# Patient Record
Sex: Female | Born: 1975 | Race: Black or African American | Hispanic: No | Marital: Single | Smoking: Never smoker
Health system: Southern US, Community
[De-identification: ages and names within clinical notes are randomized; demographics above are authoritative.]

## PROBLEM LIST (undated history)

## (undated) DIAGNOSIS — R519 Headache, unspecified: Secondary | ICD-10-CM

## (undated) DIAGNOSIS — G709 Myoneural disorder, unspecified: Secondary | ICD-10-CM

## (undated) DIAGNOSIS — K219 Gastro-esophageal reflux disease without esophagitis: Secondary | ICD-10-CM

## (undated) DIAGNOSIS — F329 Major depressive disorder, single episode, unspecified: Secondary | ICD-10-CM

## (undated) DIAGNOSIS — F32A Depression, unspecified: Secondary | ICD-10-CM

## (undated) DIAGNOSIS — E785 Hyperlipidemia, unspecified: Secondary | ICD-10-CM

## (undated) DIAGNOSIS — Z87442 Personal history of urinary calculi: Secondary | ICD-10-CM

## (undated) DIAGNOSIS — E119 Type 2 diabetes mellitus without complications: Secondary | ICD-10-CM

## (undated) DIAGNOSIS — R51 Headache: Secondary | ICD-10-CM

## (undated) DIAGNOSIS — I10 Essential (primary) hypertension: Secondary | ICD-10-CM

## (undated) HISTORY — DX: Headache: R51

## (undated) HISTORY — DX: Gastro-esophageal reflux disease without esophagitis: K21.9

## (undated) HISTORY — DX: Headache, unspecified: R51.9

## (undated) HISTORY — DX: Hyperlipidemia, unspecified: E78.5

## (undated) HISTORY — DX: Myoneural disorder, unspecified: G70.9

## (undated) HISTORY — DX: Essential (primary) hypertension: I10

---
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
## 1996-05-14 HISTORY — PX: CHOLECYSTECTOMY: SHX55

## 2004-08-19 ENCOUNTER — Emergency Department: Payer: Self-pay | Admitting: Emergency Medicine

## 2006-03-18 ENCOUNTER — Emergency Department: Payer: Self-pay | Admitting: Emergency Medicine

## 2007-03-26 ENCOUNTER — Ambulatory Visit: Payer: Self-pay | Admitting: Psychiatry

## 2007-03-26 ENCOUNTER — Inpatient Hospital Stay (HOSPITAL_COMMUNITY): Admission: RE | Admit: 2007-03-26 | Discharge: 2007-03-31 | Payer: Self-pay | Admitting: Psychiatry

## 2007-07-30 ENCOUNTER — Ambulatory Visit: Payer: Self-pay | Admitting: Family Medicine

## 2007-08-19 ENCOUNTER — Ambulatory Visit: Payer: Self-pay

## 2007-09-17 ENCOUNTER — Ambulatory Visit: Payer: Self-pay

## 2008-06-26 ENCOUNTER — Emergency Department: Payer: Self-pay | Admitting: Emergency Medicine

## 2008-09-27 ENCOUNTER — Inpatient Hospital Stay: Payer: Self-pay | Admitting: Psychiatry

## 2009-01-04 ENCOUNTER — Emergency Department: Payer: Self-pay | Admitting: Unknown Physician Specialty

## 2009-09-12 ENCOUNTER — Emergency Department: Payer: Self-pay | Admitting: Emergency Medicine

## 2010-01-25 ENCOUNTER — Emergency Department: Payer: Self-pay | Admitting: Emergency Medicine

## 2010-02-16 ENCOUNTER — Ambulatory Visit: Payer: Self-pay

## 2010-03-09 ENCOUNTER — Emergency Department: Payer: Self-pay | Admitting: Emergency Medicine

## 2010-06-08 ENCOUNTER — Emergency Department: Payer: Self-pay | Admitting: Internal Medicine

## 2010-09-29 NOTE — Discharge Summary (Signed)
NAMEJENEVIE, [name] NO.:  [id number]   MEDICAL RECORD NO.:  [id number]          PATIENT TYPE:  IPS   LOCATION:  0503                          FACILITY:  [facility]   PHYSICIAN:  Geoffery Lyons, M.D.      DATE OF BIRTH:  09/18/1975   DATE OF ADMISSION:  03/26/2007  DATE OF DISCHARGE:  03/31/2007                               DISCHARGE SUMMARY   CHIEF COMPLAINT/HISTORY OF PRESENT ILLNESS:  This is the first admission  to [facility] for this 35 year old female who was  thinking about killing herself.  Got hooked up on [place] movies for  the last 6 months.  Before that, there were other activities on the  Internet.  She would start at 8 or 9 in the morning and finish at 5 in  the morning of the next day, at least would be there 4-5 hours.  Before  the [place] movies, it was Interview with the Vampire.  Has worked  Designer, fashion/clothing, assembly type work.  Reported becoming more  depressed after the Internet service was called off, not being able to  see the movie Anime.  In the past 2-3 years, she has spent almost 24  hours per day watching science fiction, Dragon __________,  and recently  the [place] movies.  Does not trust people and prefers her animated  friends, lives  with the mother and the brother.  Currently, she was  staying with her sister due to the water and electricity not being on at  their home.  Reports being suicidal every day.  She reported she if she  cannot be in anime she would not want to be here.   PAST PSYCHIATRIC HISTORY:  She has been seen at [facility], has seen a  Veterinary surgeon.  Had been on Celexa.   MEDICAL HISTORY:  Migraines.   Physical exam performed failed to show any acute findings.   LABORATORY WORK UP:  White blood cells 14.6, repeated it was 7.0,  hemoglobin 11, mean corpuscular volume 70, glucose 164, BUN 7,  creatinine 0.54, SGOT 20, SGPT 14, hemoglobin A1c 7.2.  Drug screening  negative for substance abuse.   PHYSICAL EXAMINATION:  GENERAL:  An alert, cooperative female, casually  dressed.  Eye contact was fair, but her behavior was as expected.  She  did perform a lot of facial grimaces.  Speech was somewhat hesitant she  was alert.  Anxious mood.  Affect constricted.  Anxiety level was  moderate.  Thought process was coherent and relevant, mostly talking  about her relationship with the animated world.  Some ruminations,  obsessions.  Normal perception, although question some visual  hallucinations.  Suicidal thoughts using an exacto blade, wanting to go  to anime world, but would not act on the thoughts when in the hospital.  Cognition well-preserved.   ADMITTING DIAGNOSES:  AXIS I:  Rule out impulse control not otherwise  specified, rule out psychotic disorder not otherwise specified, rule out  social anxiety.  AXIS II:  Deferred.  AXIS III:  No diagnosis.  AXIS IV:  Moderate.  AXIS V:  Upon admission 35, highest global assessment of function in the  last year 70.   COURSE IN THE HOSPITAL:  She was admitted.  We were trying to get more  information.  Apparently, the mother shared that she has always had  problems.  Apparently, her senior year was very difficult, and she was  picked on, stay to herself, started relying more and more on the  Internet, movies, games.  On March 29, 2007, she endorsed a long  history of avoidance, stating that when in school she  was the odd  ball.  Very isolated, doing things by herself, avoiding other people.  It got to a point that she was more comfortable with being by herself,  very anxious when she thinks about getting out of the house and  interacting with people.  Endorsed that she has seen these characters  from the movies, has heard their voices.  We started Risperdal, as well  as Luvox.  She admitted that she had been depressed for a long time.  She felt that the family has not understood her depression.  She claimed  that her family,  especially her brother, has encouraged her to revoke  the devil, speaking of her depression.  Family was oriented as far as  mental illness.  We continued to work with her with the medication, work  with coping skills, finding alternate  ways of coping.  Family session  with mother and sister.  They talk about activities outside of the house  that will replace or reduce the time she spent on the Internet.  She was  receptive.  She felt that the session with the mother and the sister  went well, that she was able to share.  She felt that they understood  more so than before.  The family was supportive and understanding.  She  was going to work towards getting more involved, looking forward to  working with the counselor, and there were no suicidal or homicidal  ideations.  She was encouraged to follow up medically, given the fact  that she has had some lab findings that were abnormal.   DISCHARGE DIAGNOSES:  AXIS I:  Major depression with psychotic features,  anxiety disorder not otherwise specified.  Rule out impulse control not  otherwise specified.  AXIS II:  No diagnosis.  AXIS III:  No diagnosis.  AXIS IV: Moderate.  AXIS V:  Global assessment of function on discharge 50.   DISCHARGE MEDICATIONS:  1. Risperdal 0.25 twice a day and 0.5 at bedtime.  2. Luvox 50 mg two at bedtime.  3. Cogentin one in the morning and one at bedtime.   FOLLOWUP:  Frederich Chick and with [facility].  Follow up her low  iron and her blood sugar control.      Geoffery Lyons, M.D.  Electronically Signed     IL/MEDQ  D:  05/08/2007  T:  05/08/2007  Job:  [id number]

## 2010-12-09 ENCOUNTER — Emergency Department: Payer: Self-pay | Admitting: Unknown Physician Specialty

## 2011-02-20 LAB — IRON AND TIBC
Iron: 42
Saturation Ratios: 12 — ABNORMAL LOW
TIBC: 363
UIBC: 321

## 2011-02-20 LAB — COMPREHENSIVE METABOLIC PANEL
ALT: 14
AST: 20
Albumin: 3.9
Alkaline Phosphatase: 84
BUN: 7
CO2: 27
Calcium: 9.4
Chloride: 103
Creatinine, Ser: 0.54
GFR calc Af Amer: >60
GFR calc non Af Amer: >60
Glucose, Bld: 164 — ABNORMAL HIGH
Potassium: 4.3
Sodium: 136
Total Bilirubin: 0.8
Total Protein: 7.6

## 2011-02-20 LAB — CBC
HCT: 32.3 — ABNORMAL LOW
HCT: 34.2 — ABNORMAL LOW
Hemoglobin: 10.6 — ABNORMAL LOW
Hemoglobin: 11 — ABNORMAL LOW
MCHC: 32.6
MCHC: 32.2
MCV: 69.9 — ABNORMAL LOW
MCV: 70 — ABNORMAL LOW
Platelets: 468 — ABNORMAL HIGH
Platelets: 371
RBC: 4.63
RBC: 4.89
RDW: 17 — ABNORMAL HIGH
RDW: 17.9 — ABNORMAL HIGH
WBC: 7
WBC: 14.6 — ABNORMAL HIGH

## 2011-02-20 LAB — DRUGS OF ABUSE SCREEN W/O ALC, ROUTINE URINE
Amphetamine Screen, Ur: NEGATIVE
Barbiturate Quant, Ur: NEGATIVE
Benzodiazepines.: NEGATIVE
Cocaine Metabolites: NEGATIVE
Creatinine,U: 112
Marijuana Metabolite: NEGATIVE
Methadone: NEGATIVE
Opiate Screen, Urine: NEGATIVE
Phencyclidine (PCP): NEGATIVE
Propoxyphene: NEGATIVE

## 2011-02-20 LAB — LIPID PANEL
Cholesterol: 147
HDL: 34 — ABNORMAL LOW
LDL Cholesterol: 96
Total CHOL/HDL Ratio: 4.3
Triglycerides: 87
VLDL: 17

## 2011-02-20 LAB — URINALYSIS, ROUTINE W REFLEX MICROSCOPIC
Bilirubin Urine: NEGATIVE
Glucose, UA: 250 — AB
Hgb urine dipstick: NEGATIVE
Ketones, ur: NEGATIVE
Nitrite: NEGATIVE
Protein, ur: NEGATIVE
Specific Gravity, Urine: 1.023
Urobilinogen, UA: 0.2
pH: 5.5

## 2011-02-20 LAB — FERRITIN: Ferritin: 46 (ref 10–291)

## 2011-02-20 LAB — DIFFERENTIAL
Basophils Absolute: 0
Basophils Relative: 0
Eosinophils Absolute: 0.2
Eosinophils Relative: 3
Lymphocytes Relative: 34
Lymphs Abs: 2.4
Monocytes Absolute: 0.6
Monocytes Relative: 8
Neutro Abs: 3.8
Neutrophils Relative %: 55

## 2011-02-20 LAB — PREGNANCY, URINE: Preg Test, Ur: NEGATIVE

## 2011-02-20 LAB — TSH: TSH: 2.517

## 2011-02-20 LAB — HEMOGLOBIN A1C
Hgb A1c MFr Bld: 7.2 — ABNORMAL HIGH
Mean Plasma Glucose: 179

## 2011-02-20 LAB — RPR: RPR Ser Ql: NONREACTIVE

## 2011-02-20 LAB — VITAMIN B12: Vitamin B-12: 315 (ref 211–911)

## 2011-09-08 ENCOUNTER — Emergency Department: Payer: Self-pay | Admitting: *Deleted

## 2011-09-08 LAB — URINALYSIS, COMPLETE
Bilirubin,UR: NEGATIVE
Blood: NEGATIVE
Glucose,UR: NEGATIVE mg/dL (ref 0–75)
Ketone: NEGATIVE
Leukocyte Esterase: NEGATIVE
Nitrite: NEGATIVE
Ph: 5 (ref 4.5–8.0)
Protein: NEGATIVE
RBC,UR: <1 /HPF (ref 0–5)
Specific Gravity: 1.015 (ref 1.003–1.030)
Squamous Epithelial: 6
WBC UR: 6 /HPF (ref 0–5)

## 2012-08-04 ENCOUNTER — Ambulatory Visit: Payer: Self-pay | Admitting: Ophthalmology

## 2013-04-01 ENCOUNTER — Other Ambulatory Visit: Payer: Self-pay | Admitting: Diagnostic Radiology

## 2013-04-01 LAB — HCG, QUANTITATIVE, PREGNANCY: Beta Hcg, Quant.: <1 m[IU]/mL — ABNORMAL LOW

## 2013-04-02 ENCOUNTER — Ambulatory Visit: Payer: Self-pay | Admitting: Internal Medicine

## 2013-05-14 DIAGNOSIS — Z87442 Personal history of urinary calculi: Secondary | ICD-10-CM

## 2013-05-14 HISTORY — DX: Personal history of urinary calculi: Z87.442

## 2013-06-17 ENCOUNTER — Emergency Department: Payer: Self-pay | Admitting: Emergency Medicine

## 2013-09-03 ENCOUNTER — Ambulatory Visit: Payer: Self-pay

## 2013-09-08 ENCOUNTER — Ambulatory Visit: Payer: Self-pay

## 2013-09-08 DIAGNOSIS — I4892 Unspecified atrial flutter: Secondary | ICD-10-CM

## 2013-12-16 ENCOUNTER — Ambulatory Visit: Payer: Self-pay

## 2014-01-18 ENCOUNTER — Emergency Department: Payer: Self-pay | Admitting: Emergency Medicine

## 2014-01-18 LAB — URINALYSIS, COMPLETE
Bacteria: NONE SEEN
Bilirubin,UR: NEGATIVE
Glucose,UR: NEGATIVE mg/dL (ref 0–75)
Ketone: NEGATIVE
Nitrite: NEGATIVE
Ph: 5 (ref 4.5–8.0)
Protein: NEGATIVE
RBC,UR: 756 /HPF (ref 0–5)
Specific Gravity: 1.015 (ref 1.003–1.030)
Squamous Epithelial: 1
WBC UR: 3 /HPF (ref 0–5)

## 2014-01-18 LAB — PREGNANCY, URINE: Pregnancy Test, Urine: NEGATIVE m[IU]/mL

## 2014-01-23 ENCOUNTER — Encounter (HOSPITAL_COMMUNITY): Payer: Self-pay | Admitting: Emergency Medicine

## 2014-01-23 ENCOUNTER — Emergency Department (HOSPITAL_COMMUNITY)
Admission: EM | Admit: 2014-01-23 | Discharge: 2014-01-24 | Disposition: A | Discharge status: Home / Self Care | Payer: Self-pay | Attending: Emergency Medicine | Admitting: Emergency Medicine

## 2014-01-23 ENCOUNTER — Emergency Department (HOSPITAL_COMMUNITY): Payer: Self-pay

## 2014-01-23 DIAGNOSIS — Z880 Allergy status to penicillin: Secondary | ICD-10-CM | POA: Insufficient documentation

## 2014-01-23 DIAGNOSIS — Z79899 Other long term (current) drug therapy: Secondary | ICD-10-CM | POA: Insufficient documentation

## 2014-01-23 DIAGNOSIS — E119 Type 2 diabetes mellitus without complications: Secondary | ICD-10-CM | POA: Insufficient documentation

## 2014-01-23 DIAGNOSIS — Z9089 Acquired absence of other organs: Secondary | ICD-10-CM | POA: Insufficient documentation

## 2014-01-23 DIAGNOSIS — R109 Unspecified abdominal pain: Secondary | ICD-10-CM | POA: Insufficient documentation

## 2014-01-23 DIAGNOSIS — Z3202 Encounter for pregnancy test, result negative: Secondary | ICD-10-CM | POA: Insufficient documentation

## 2014-01-23 DIAGNOSIS — M5442 Lumbago with sciatica, left side: Secondary | ICD-10-CM

## 2014-01-23 DIAGNOSIS — M5430 Sciatica, unspecified side: Secondary | ICD-10-CM | POA: Insufficient documentation

## 2014-01-23 DIAGNOSIS — M5441 Lumbago with sciatica, right side: Secondary | ICD-10-CM

## 2014-01-23 HISTORY — DX: Type 2 diabetes mellitus without complications: E11.9

## 2014-01-23 LAB — CBC WITH DIFFERENTIAL/PLATELET
Basophils Absolute: 0 10*3/uL (ref 0.0–0.1)
Basophils Relative: 0 % (ref 0–1)
Eosinophils Absolute: 0.2 10*3/uL (ref 0.0–0.7)
Eosinophils Relative: 3 % (ref 0–5)
HCT: 32.8 % — ABNORMAL LOW (ref 36.0–46.0)
Hemoglobin: 10.7 g/dL — ABNORMAL LOW (ref 12.0–15.0)
Lymphocytes Relative: 30 % (ref 12–46)
Lymphs Abs: 2.5 10*3/uL (ref 0.7–4.0)
MCH: 23.5 pg — ABNORMAL LOW (ref 26.0–34.0)
MCHC: 32.6 g/dL (ref 30.0–36.0)
MCV: 72.1 fL — ABNORMAL LOW (ref 78.0–100.0)
Monocytes Absolute: 0.5 10*3/uL (ref 0.1–1.0)
Monocytes Relative: 6 % (ref 3–12)
Neutro Abs: 5 10*3/uL (ref 1.7–7.7)
Neutrophils Relative %: 61 % (ref 43–77)
Platelets: 426 10*3/uL — ABNORMAL HIGH (ref 150–400)
RBC: 4.55 MIL/uL (ref 3.87–5.11)
RDW: 14.8 % (ref 11.5–15.5)
WBC: 8.2 10*3/uL (ref 4.0–10.5)

## 2014-01-23 LAB — COMPREHENSIVE METABOLIC PANEL
ALT: 15 U/L (ref 0–35)
AST: 16 U/L (ref 0–37)
Albumin: 4 g/dL (ref 3.5–5.2)
Alkaline Phosphatase: 87 U/L (ref 39–117)
Anion gap: 12 (ref 5–15)
BUN: 9 mg/dL (ref 6–23)
CO2: 27 mEq/L (ref 19–32)
Calcium: 9.6 mg/dL (ref 8.4–10.5)
Chloride: 99 mEq/L (ref 96–112)
Creatinine, Ser: 0.72 mg/dL (ref 0.50–1.10)
GFR calc Af Amer: >90 mL/min (ref >90)
GFR calc non Af Amer: >90 mL/min (ref >90)
Glucose, Bld: 86 mg/dL (ref 70–99)
Potassium: 4.1 mEq/L (ref 3.7–5.3)
Sodium: 138 mEq/L (ref 137–147)
Total Bilirubin: 0.3 mg/dL (ref 0.3–1.2)
Total Protein: 7.9 g/dL (ref 6.0–8.3)

## 2014-01-23 LAB — URINALYSIS, ROUTINE W REFLEX MICROSCOPIC
Bilirubin Urine: NEGATIVE
Glucose, UA: NEGATIVE mg/dL
Hgb urine dipstick: NEGATIVE
Ketones, ur: NEGATIVE mg/dL
Leukocytes, UA: NEGATIVE
Nitrite: NEGATIVE
Protein, ur: NEGATIVE mg/dL
Specific Gravity, Urine: 1.016 (ref 1.005–1.030)
Urobilinogen, UA: 0.2 mg/dL (ref 0.0–1.0)
pH: 7 (ref 5.0–8.0)

## 2014-01-23 LAB — POC URINE PREG, ED: Preg Test, Ur: NEGATIVE

## 2014-01-23 MED ORDER — HYDROCODONE-ACETAMINOPHEN 5-325 MG PO TABS
2.0000 | ORAL_TABLET | Freq: Once | ORAL | Status: AC
  Administered 2014-01-23: 2 via ORAL
  Filled 2014-01-23: qty 2

## 2014-01-23 NOTE — ED Notes (Signed)
The patient said she went to [facility] last week for the same thing and they scanned her.  The patient said they told her she had kidney stones but that was not why she was haivng back pain.  She said her pain has gotten worse even with taking tramadol for pain and her urine has gotten darker.  She says she hurts when she gets up and when she is laying down and tries to sit up.  She also says the pain radiates down both legs but mainly down her right leg.

## 2014-01-23 NOTE — ED Provider Notes (Signed)
CSN: [id number]     Arrival date & time 01/23/14  2151 History   First MD Initiated Contact with Patient 01/23/14 2231     Chief Complaint  Patient presents with  . Flank Pain    The patient said she went to [facility] last week for the same thing and they scanned her.  The patient said they told her she had kidney stones but that was not why she was haivng back pain.     (Consider location/radiation/quality/duration/timing/severity/associated sxs/prior Treatment) HPI [name] is a 38 year old female with past medical history of diabetes who presents to the ER tonight with back pain. Patient states last week around Thursday or Friday she was walking her dog, and the dog "pulled her the wrong way" which caused her to fall. Patient states since then she has been having a worsening lumbar back pain. Patient states her pain as a constant pain, a 10 out of 10 in severity, radiates to her lateral legs bilaterally down to her knees. Patient states any movement, range of motion of her back, or walking worsens her pain. Patient states lying flat alleviates her pain slightly. Patient denies any associated numbness, weakness, fever, abdominal pain, nausea, saddle anesthesia, urinary/bowel incontinence/retention. Patient also denies history of cancer, IV drug use.  Past Medical History  Diagnosis Date  . Diabetes mellitus without complication    Past Surgical History  Procedure Laterality Date  . Cholecystectomy     History reviewed. No pertinent family history. History  Substance Use Topics  . Smoking status: Never Smoker   . Smokeless tobacco: Never Used  . Alcohol Use: No   OB History   Grav Para Term Preterm Abortions TAB SAB Ect Mult Living                 Review of Systems  Constitutional: Negative for fever.  HENT: Negative for trouble swallowing.   Eyes: Negative for visual disturbance.  Respiratory: Negative for shortness of breath.   Cardiovascular: Negative for chest  pain.  Gastrointestinal: Negative for nausea, vomiting and abdominal pain.  Genitourinary: Negative for dysuria.  Musculoskeletal: Positive for back pain. Negative for neck pain.  Skin: Negative for rash.  Neurological: Negative for dizziness, weakness and numbness.  Psychiatric/Behavioral: Negative.       Allergies  Penicillins  Home Medications   Prior to Admission medications   Medication Sig Start Date End Date Taking? Authorizing Provider  glipiZIDE (GLUCOTROL) 10 MG tablet Take 10 mg by mouth 2 (two) times daily before a meal.   Yes Historical Provider, MD  Iron TABS Take 1 tablet by mouth daily.   Yes Historical Provider, MD  metFORMIN (GLUCOPHAGE) 1000 MG tablet Take 1,000 mg by mouth 2 (two) times daily with a meal.   Yes Historical Provider, MD  ranitidine (ZANTAC) 150 MG tablet Take 150 mg by mouth 2 (two) times daily.   Yes Historical Provider, MD  traMADol (ULTRAM) 50 MG tablet Take 50 mg by mouth every 6 (six) hours as needed for moderate pain.   Yes Historical Provider, MD  VERAPAMIL HCL PO Take 1 tablet by mouth daily.   Yes Historical Provider, MD  cyclobenzaprine (FLEXERIL) 10 MG tablet Take 1 tablet (10 mg total) by mouth 2 (two) times daily as needed for muscle spasms. 01/24/14   Monte Fantasia, PA-C  ibuprofen (ADVIL,MOTRIN) 800 MG tablet Take 1 tablet (800 mg total) by mouth 3 (three) times daily. 01/24/14   Monte Fantasia, PA-C   BP  109/53  Pulse 68  Temp(Src) 98.7 F (37.1 C) (Oral)  Resp 21  SpO2 100%  LMP 01/14/2014 Physical Exam  Constitutional: She appears well-developed and well-nourished. No distress.  HENT:  Head: Normocephalic and atraumatic.  Mouth/Throat: Oropharynx is clear and moist. No oropharyngeal exudate.  Eyes: Right eye exhibits no discharge. Left eye exhibits no discharge. No scleral icterus.  Neck: Normal range of motion.  Cardiovascular: Normal rate, regular rhythm and normal heart sounds.   No murmur heard. Pulmonary/Chest: Effort  normal and breath sounds normal. No respiratory distress.  Abdominal: Soft. There is no tenderness.  Musculoskeletal: She exhibits no edema.       Lumbar back: She exhibits tenderness, bony tenderness and pain. She exhibits normal range of motion, no swelling, no edema and no deformity.       Back:  Neurological: She has normal strength. No cranial nerve deficit or sensory deficit. She displays a negative Romberg sign. Coordination and gait normal. GCS eye subscore is 4. GCS verbal subscore is 5. GCS motor subscore is 6.  Reflex Scores:      Patellar reflexes are 2+ on the right side and 2+ on the left side.      Achilles reflexes are 2+ on the right side and 2+ on the left side. Fully alert, answering questions appropriately in full, clear sentences. Motor strength 5 out of 5 in all major muscle groups of upper and lower extremities. Positive straight leg raise bilaterally. Distal sensation intact the  Skin: Skin is warm and dry. No rash noted. She is not diaphoretic.  Psychiatric: She has a normal mood and affect.    ED Course  Procedures (including critical care time) Labs Review Labs Reviewed  URINALYSIS, ROUTINE W REFLEX MICROSCOPIC - Abnormal; Notable for the following:    APPearance CLOUDY (*)    All other components within normal limits  CBC WITH DIFFERENTIAL - Abnormal; Notable for the following:    Hemoglobin 10.7 (*)    HCT 32.8 (*)    MCV 72.1 (*)    MCH 23.5 (*)    Platelets 426 (*)    All other components within normal limits  COMPREHENSIVE METABOLIC PANEL  POC URINE PREG, ED    Imaging Review Dg Lumbar Spine Complete  01/23/2014   CLINICAL DATA:  Larey Seat.  Back pain.  EXAM: LUMBAR SPINE - COMPLETE 4+ VIEW  COMPARISON:  None.  FINDINGS: Normal alignment of the lumbar vertebral bodies. Disc spaces and vertebral bodies are maintained. The facets are normally aligned. No pars defects. The visualized bony pelvis is intact.  IMPRESSION: Normal alignment and no acute bony  findings.   Electronically Signed   By: Loralie Champagne M.D.   On: 01/23/2014 23:39     EKG Interpretation None      MDM   Final diagnoses:  Midline low back pain with left-sided sciatica  Midline low back pain with right-sided sciatica    Patient with back pain.  No neurological deficits and normal neuro exam.  Patient can walk but states is painful.  No loss of bowel or bladder control.  No concern for cauda equina.  No fever, night sweats, weight loss, h/o cancer, IVDU.  RICE protocol and pain medicine indicated and discussed with patient. Do to history of trauma with the fall, we will obtain radiographs of lumbar spine.  12:30 AM: Patient asleep on the bed when I talked to her. Advised patient her x-rays are unremarkable, and we'll discharge her at this time. We  encouraged patient to follow up with primary care physician, and provided her with a resource guide to help her find one. I also encouraged patient to call or return to the ER should she have any questions or concerns, or should her symptoms change, worsen.  BP 109/53  Pulse 68  Temp(Src) 98.7 F (37.1 C) (Oral)  Resp 21  SpO2 100%  LMP 01/14/2014   Signed,  Ladona Mow, PA-C 1:30 AM  This patient discussed with Dr. Benjiman Core, M.D.   Monte Fantasia, PA-C 01/24/14 0127  Monte Fantasia, PA-C 01/24/14 0130

## 2014-01-24 MED ORDER — CYCLOBENZAPRINE HCL 10 MG PO TABS: 10.0000 mg | ORAL_TABLET | Freq: Two times a day (BID) | ORAL | Status: DC | PRN

## 2014-01-24 MED ORDER — IBUPROFEN 800 MG PO TABS: 800.0000 mg | ORAL_TABLET | Freq: Three times a day (TID) | ORAL | Status: DC

## 2014-01-24 NOTE — ED Notes (Signed)
Patient is alert and orientedx4.  Patient was explained discharge instructions and they understood them with no questions.  The patient's mother, Ernestene Mention is taking the patient home.

## 2014-01-24 NOTE — Discharge Instructions (Signed)
Use Motrin 800mg  3 times a day as needed for pain. Use Flexeril twice a day as needed as a muscle relaxer. Refer to resource guide to help find a primary care physician to followup with for your lumbar back pain. Return to the ER if your symptoms change, worsen, if you develop any weakness, fever, nausea, vomiting, urinary or bowel trouble or should you have any questions or concerns.   Back Pain, Adult Low back pain is very common. About 1 in 5 people have back pain.The cause of low back pain is rarely dangerous. The pain often gets better over time.About half of people with a sudden onset of back pain feel better in just 2 weeks. About 8 in 10 people feel better by 6 weeks.  CAUSES Some common causes of back pain include:  Strain of the muscles or ligaments supporting the spine.  Wear and tear (degeneration) of the spinal discs.  Arthritis.  Direct injury to the back. DIAGNOSIS Most of the time, the direct cause of low back pain is not known.However, back pain can be treated effectively even when the exact cause of the pain is unknown.Answering your caregiver's questions about your overall health and symptoms is one of the most accurate ways to make sure the cause of your pain is not dangerous. If your caregiver needs more information, he or she may order lab work or imaging tests (X-rays or MRIs).However, even if imaging tests show changes in your back, this usually does not require surgery. HOME CARE INSTRUCTIONS For many people, back pain returns.Since low back pain is rarely dangerous, it is often a condition that people can learn to [employer] their own.   Remain active. It is stressful on the back to sit or stand in one place. Do not sit, drive, or stand in one place for more than 30 minutes at a time. Take short walks on level surfaces as soon as pain allows.Try to increase the length of time you walk each day.  Do not stay in bed.Resting more than 1 or 2 days can delay your  recovery.  Do not avoid exercise or work.Your body is made to move.It is not dangerous to be active, even though your back may hurt.Your back will likely heal faster if you return to being active before your pain is gone.  Pay attention to your body when you bend and lift. Many people have less discomfortwhen lifting if they bend their knees, keep the load close to their bodies,and avoid twisting. Often, the most comfortable positions are those that put less stress on your recovering back.  Find a comfortable position to sleep. Use a firm mattress and lie on your side with your knees slightly bent. If you lie on your back, put a pillow under your knees.  Only take over-the-counter or prescription medicines as directed by your caregiver. Over-the-counter medicines to reduce pain and inflammation are often the most helpful.Your caregiver may prescribe muscle relaxant drugs.These medicines help dull your pain so you can more quickly return to your normal activities and healthy exercise.  Put ice on the injured area.  Put ice in a plastic bag.  Place a towel between your skin and the bag.  Leave the ice on for 15-20 minutes, 03-04 times a day for the first 2 to 3 days. After that, ice and heat may be alternated to reduce pain and spasms.  Ask your caregiver about trying back exercises and gentle massage. This may be of some benefit.  Avoid  feeling anxious or stressed.Stress increases muscle tension and can worsen back pain.It is important to recognize when you are anxious or stressed and learn ways to manage it.Exercise is a great option. SEEK MEDICAL CARE IF:  You have pain that is not relieved with rest or medicine.  You have pain that does not improve in 1 week.  You have new symptoms.  You are generally not feeling well. SEEK IMMEDIATE MEDICAL CARE IF:   You have pain that radiates from your back into your legs.  You develop new bowel or bladder control problems.  You  have unusual weakness or numbness in your arms or legs.  You develop nausea or vomiting.  You develop abdominal pain.  You feel faint. Document Released: 04/30/2005 Document Revised: 10/30/2011 Document Reviewed: 09/01/2013 [facility] Patient Information 2015 [place], [place]. This information is not intended to replace advice given to you by your health care provider. Make sure you discuss any questions you have with your health care provider.   Sciatica Sciatica is pain, weakness, numbness, or tingling along the path of the sciatic nerve. The nerve starts in the lower back and runs down the back of each leg. The nerve controls the muscles in the lower leg and in the back of the knee, while also providing sensation to the back of the thigh, lower leg, and the sole of your foot. Sciatica is a symptom of another medical condition. For instance, nerve damage or certain conditions, such as a herniated disk or bone spur on the spine, pinch or put pressure on the sciatic nerve. This causes the pain, weakness, or other sensations normally associated with sciatica. Generally, sciatica only affects one side of the body. CAUSES   Herniated or slipped disc.  Degenerative disk disease.  A pain disorder involving the narrow muscle in the buttocks (piriformis syndrome).  Pelvic injury or fracture.  Pregnancy.  Tumor (rare). SYMPTOMS  Symptoms can vary from mild to very severe. The symptoms usually travel from the low back to the buttocks and down the back of the leg. Symptoms can include:  Mild tingling or dull aches in the lower back, leg, or hip.  Numbness in the back of the calf or sole of the foot.  Burning sensations in the lower back, leg, or hip.  Sharp pains in the lower back, leg, or hip.  Leg weakness.  Severe back pain inhibiting movement. These symptoms may get worse with coughing, sneezing, laughing, or prolonged sitting or standing. Also, being overweight may worsen  symptoms. DIAGNOSIS  Your caregiver will perform a physical exam to look for common symptoms of sciatica. He or she may ask you to do certain movements or activities that would trigger sciatic nerve pain. Other tests may be performed to find the cause of the sciatica. These may include:  Blood tests.  X-rays.  Imaging tests, such as an MRI or CT scan. TREATMENT  Treatment is directed at the cause of the sciatic pain. Sometimes, treatment is not necessary and the pain and discomfort goes away on its own. If treatment is needed, your caregiver may suggest:  Over-the-counter medicines to relieve pain.  Prescription medicines, such as anti-inflammatory medicine, muscle relaxants, or narcotics.  Applying heat or ice to the painful area.  Steroid injections to lessen pain, irritation, and inflammation around the nerve.  Reducing activity during periods of pain.  Exercising and stretching to strengthen your abdomen and improve flexibility of your spine. Your caregiver may suggest losing weight if the extra weight  makes the back pain worse.  Physical therapy.  Surgery to eliminate what is pressing or pinching the nerve, such as a bone spur or part of a herniated disk. HOME CARE INSTRUCTIONS   Only take over-the-counter or prescription medicines for pain or discomfort as directed by your caregiver.  Apply ice to the affected area for 20 minutes, 3-4 times a day for the first 48-72 hours. Then try heat in the same way.  Exercise, stretch, or perform your usual activities if these do not aggravate your pain.  Attend physical therapy sessions as directed by your caregiver.  Keep all follow-up appointments as directed by your caregiver.  Do not wear high heels or shoes that do not provide proper support.  Check your mattress to see if it is too soft. A firm mattress may lessen your pain and discomfort. SEEK IMMEDIATE MEDICAL CARE IF:   You lose control of your bowel or bladder  (incontinence).  You have increasing weakness in the lower back, pelvis, buttocks, or legs.  You have redness or swelling of your back.  You have a burning sensation when you urinate.  You have pain that gets worse when you lie down or awakens you at night.  Your pain is worse than you have experienced in the past.  Your pain is lasting longer than 4 weeks.  You are suddenly losing weight without reason. MAKE SURE YOU:  Understand these instructions.  Will watch your condition.  Will get help right away if you are not doing well or get worse. Document Released: 04/24/2001 Document Revised: 10/30/2011 Document Reviewed: 09/09/2011 [facility] Patient Information 2015 [place], [place]. This information is not intended to replace advice given to you by your health care provider. Make sure you discuss any questions you have with your health care provider.   Emergency Department Resource Guide 1) Find a Doctor and Pay Out of Pocket Although you won't have to find out who is covered by your insurance plan, it is a good idea to ask around and get recommendations. You will then need to call the office and see if the doctor you have chosen will accept you as a new patient and what types of options they offer for patients who are self-pay. Some doctors offer discounts or will set up payment plans for their patients who do not have insurance, but you will need to ask so you aren't surprised when you get to your appointment.  2) Contact Your Local Health Department Not all health departments have doctors that can see patients for sick visits, but many do, so it is worth a call to see if yours does. If you don't know where your local health department is, you can check in your phone book. The CDC also has a tool to help you locate your state's health department, and many state websites also have listings of all of their local health departments.  3) Find a Walk-in Clinic If your illness is not  likely to be very severe or complicated, you may want to try a walk in clinic. These are popping up all over the country in pharmacies, drugstores, and shopping centers. They're usually staffed by nurse practitioners or physician assistants that have been trained to treat common illnesses and complaints. They're usually fairly quick and inexpensive. However, if you have serious medical issues or chronic medical problems, these are probably not your best option.  No Primary Care Doctor: - Call Health Connect at  [phone] - they can help you locate  a primary care doctor that  accepts your insurance, provides certain services, etc. - Physician Referral Service- [phone]  Chronic Pain Problems: Organization         Address  Phone   Notes  [facility]  [phone] Patients need to be referred by their primary care doctor.   Medication Assistance: Organization         Address  Phone   Notes  [facility] [address] [place] [place], [place] [address] [phone] --Must be a resident of [facility] -- Must have NO insurance coverage whatsoever (no Medicaid/ Medicare, etc.) -- The pt. MUST have a primary care doctor that directs their care regularly and follows them in the community   [facility]  [phone]   [employer]  [phone]    Agencies that provide inexpensive medical care: Organization         Address  Phone   Notes  [facility]  [phone]   [facility]    [phone]   [facility] [address] [place], [place] [address] [phone]   [facility] [address] [place], [place] [phone]   Planned Parenthood    [phone]   [facility]    [phone]   [facility] [place]  [address], [place] Phone:  [phone], Fax:  [phone] Hours of Operation:  9 am - 6 pm,  M-F.  Also accepts Medicaid/Medicare and self-pay.  [facility]  [address], [place] Phone: [phone], Fax: [phone]. Hours of Operation:  8:30 am - 5:30 pm, M-F.  Also accepts Medicaid and self-pay.  [facility] [place] [address], [place] Phone: [phone]   [facility] [address] Natasha Bence [place], [place] [phone], Ext. [phone] Mondays & Thursdays: 7-9 AM.  First 15 patients are seen on a first come, first serve basis.    Medicaid-accepting [facility] Providers:  Organization         Address  Phone   Notes  [facility] [address], [place] [phone] Also accepts self-pay patients.  [facility] [address] Laurell Josephs [place], [place]  [phone]   [facility] [address], [place] [phone]   [facility] [address], [place] [phone]   Renaye Rakers [address], [place]   [phone] Only accepts [place] Access [place] patients after they have their name applied to their card.   Self-Pay (no insurance) in [facility]:  Organization         Address  Phone   Notes  Sickle Cell Patients, [facility] Internal [facility] [address] [place], [place] [phone]   [facility] [address] [place], [place] [phone]   [facility] [place]  [address], [place] [phone]   [facility]/Dr. Osei-Bonsu  [address] [place] or [address], [place] [phone] Phone number for both [place] and [place] locations is the same.  [facility] [address], [place] [phone]   [facility] [address], [place] or [address] [phone]  [phone]   [facility] [address], [place] [phone], phone; [phone], fax Sees patients 1st and 3rd Saturday of every month.  Must not qualify for public or private insurance (i.e. Medicaid, Medicare, [place] Health Choice, Veterans' Benefits)  Household income should be no more than 200% of the poverty level The clinic cannot treat you if you are pregnant or think you are pregnant  Sexually transmitted diseases are not treated at the clinic.    Dental Care: Organization         Address  Phone  Notes  [facility] Department of [facility] [address] [place], [place] [phone] Accepts children up to age 1 who are enrolled in [place] or [place] Health Choice; pregnant women with a Medicaid card; and children who have applied for Medicaid or [place] Health Choice, but were declined, whose parents can pay a reduced fee at time of service.  [facility]  [address], [place] [phone] Accepts children up to age 2 who are enrolled in [place] or [place] Health Choice; pregnant women with a Medicaid card; and children who have applied for Medicaid or [place] Health Choice, but were declined, whose parents can pay a reduced fee at time of service.  [facility]  [address] [place], [place] [phone] Patients are seen by appointment only. Walk-ins are not accepted. [facility] will see patients 91 years of age and older. Monday - Tuesday (8am-5pm) Most Wednesdays (8:30-5pm) $30 per visit, cash only  [facility]  [address], [facility] [phone] Patients are seen by appointment only. Walk-ins are not accepted. [facility] will see patients 38 years of age and older. One Wednesday Evening (Monthly: Volunteer Based).  $30 per visit, cash only  [employer]  [phone] for adults; Children under age 19, call Graduate Pediatric Dentistry at [phone]. Children aged 42-14, please call [phone] to request a pediatric application.  Dental services are provided in all areas of dental care including fillings, crowns and bridges, complete and partial dentures, implants, gum treatment, root canals, and extractions. Preventive care is also provided. Treatment is provided to both adults and children. Patients are selected via a lottery and there is often a waiting list.   [facility] [address], [place]  [phone] [email or web address]   [facility] [address] [place], [place] [phone], Ext. [phone] Second and Fourth Thursday of each month, opens at 6:30 AM; Clinic ends at 9 AM.  Patients are seen on a first-come first-served basis, and a limited number are seen during each clinic.   [facility]  [address] Ether Griffins [place], [place] [phone]   Eligibility Requirements You must have lived in [place], [place], or [place] counties for at least the last three months.   You cannot be eligible for state or federal sponsored [place], including [employer], [place], or [employer].   You generally cannot be eligible for healthcare insurance through your employer.    How to apply: Eligibility screenings are held every Tuesday and Wednesday afternoon from 1:00 pm until 4:00 pm. You do not need an appointment for the interview!  [facility] [address] [place], [place] [phone]   [facility] Health Department  [phone]   [facility] Health Department  [phone]  [facility] Department  [phone]    Behavioral Health Resources in the Community: Intensive Outpatient Programs Organization         Address  Phone  Notes  [facility] [address] [place], [place] [phone]   [facility] Outpatient [address] [place], [place] [phone]   [facility]  [address] [place], [place]  [phone]   [facility] [address]  [place], [place] [phone] or [phone]   Substance Abuse Resources Organization         Address  Phone  Notes  [facility]  [phone]   [facility]  [phone]   [facility] [place]  ([phone]   Floydene Flock  [phone]   Residential & Outpatient Substance Abuse Program  ([phone]   Psychological Services Organization         Address  Phone  Notes  [facility] Behavioral Health  [phone]   [facility] Services  [phone]   [facility] [address] [place] [phone] or [phone]    Mobile Crisis Teams Organization         Address  Phone  Notes  [facility], Mobile Crisis Care Unit  [phone]   [facility]  [address] [place], [place] [phone]   Doristine Locks [address] [place] [phone]    Self-Help/Support Groups Organization         Address  Phone             Notes  [facility] [place] - variety of support groups  336- I7437963 Call for more information  Narcotics Anonymous (NA), [facility] [address], [employer] [place]  2 meetings at this location   Statistician         Address  Phone  Notes  [facility] [address],    [place]  [phone]   [facility]  [address], [place] [address], [place], [place] [phone]   [facility] [address] [place], [place] [phone] Admissions: 8am-3pm M-F  [facility] [address],    [place], [place] [phone]   [facility] [address] [place], [place], [place] [phone]   [facility] [address]  [place], [place] [phone]   [facility] - Intensive Outpatient [address] Laurell Josephs [address], [place], [place]  [phone]   [facility] ([facility] [address] [place]  [place], [place] [phone] or [phone]   [facility] ([facility]) [address], [place], [place] [phone] Accepts Medicaid  [facility] [address],  [place] [phone] Substance Abuse/Addiction Treatment   [facility] Organization         Address  Phone  Notes  [facility]  [phone]   Angie Fava, PhD [address] Ervin Knack [place], [place]   [phone] or [phone]   [facility]   [address] [place], [place] [phone]   [facility] [address], [employer], [place] [phone] Insurance/Medicaid/sponsorship through [employer] and Families [address]                                    [place], [place] [phone]  [place], [place] [phone]    Dr. Adele Schilder  [phone]   [facility] Dept. 1) [address], [place] 2) [place] 3)  [address], [place] [phone]  [phone]   [place] [phone] or [phone] (After Hours)

## 2014-01-24 NOTE — ED Provider Notes (Signed)
Medical screening examination/treatment/procedure(s) were performed by non-physician practitioner and as supervising physician I was immediately available for consultation/collaboration.   EKG Interpretation None       Juliet Rude. Rubin Payor, MD 01/24/14 1551

## 2014-01-24 NOTE — ED Notes (Signed)
Family at bedside. 

## 2014-02-09 ENCOUNTER — Ambulatory Visit: Payer: Self-pay

## 2014-02-09 LAB — HCG, QUANTITATIVE, PREGNANCY: Beta Hcg, Quant.: 1 m[IU]/mL

## 2014-04-02 ENCOUNTER — Emergency Department: Payer: Self-pay | Admitting: Emergency Medicine

## 2014-05-19 ENCOUNTER — Ambulatory Visit: Payer: Self-pay | Admitting: Nurse Practitioner

## 2014-05-27 LAB — HM DIABETES EYE EXAM

## 2014-06-06 ENCOUNTER — Emergency Department: Payer: Self-pay | Admitting: Emergency Medicine

## 2014-06-06 LAB — PREGNANCY, URINE: Pregnancy Test, Urine: NEGATIVE m[IU]/mL

## 2014-06-06 LAB — CBC WITH DIFFERENTIAL/PLATELET
Basophil #: 0 10*3/uL (ref 0.0–0.1)
Basophil %: 0.5 %
Eosinophil #: 0.2 10*3/uL (ref 0.0–0.7)
Eosinophil %: 2.1 %
HCT: 34.4 % — ABNORMAL LOW (ref 35.0–47.0)
HGB: 10.9 g/dL — ABNORMAL LOW (ref 12.0–16.0)
Lymphocyte #: 2.1 10*3/uL (ref 1.0–3.6)
Lymphocyte %: 25.8 %
MCH: 23.7 pg — ABNORMAL LOW (ref 26.0–34.0)
MCHC: 31.8 g/dL — ABNORMAL LOW (ref 32.0–36.0)
MCV: 74 fL — ABNORMAL LOW (ref 80–100)
Monocyte #: 0.6 x10 3/mm (ref 0.2–0.9)
Monocyte %: 6.8 %
Neutrophil #: 5.3 10*3/uL (ref 1.4–6.5)
Neutrophil %: 64.8 %
Platelet: 368 10*3/uL (ref 150–440)
RBC: 4.62 10*6/uL (ref 3.80–5.20)
RDW: 15.4 % — ABNORMAL HIGH (ref 11.5–14.5)
WBC: 8.2 10*3/uL (ref 3.6–11.0)

## 2014-06-06 LAB — URINALYSIS, COMPLETE
Bacteria: NONE SEEN
Bilirubin,UR: NEGATIVE
Glucose,UR: 150 mg/dL (ref 0–75)
Hyaline Cast: 2
Leukocyte Esterase: NEGATIVE
Nitrite: NEGATIVE
Ph: 5 (ref 4.5–8.0)
Protein: NEGATIVE
RBC,UR: 12 /HPF (ref 0–5)
Specific Gravity: 1.017 (ref 1.003–1.030)
Squamous Epithelial: 2
WBC UR: 1 /HPF (ref 0–5)

## 2014-06-06 LAB — COMPREHENSIVE METABOLIC PANEL
Albumin: 3.7 g/dL (ref 3.4–5.0)
Alkaline Phosphatase: 96 U/L
Anion Gap: 7 (ref 7–16)
BUN: 12 mg/dL (ref 7–18)
Bilirubin,Total: 0.5 mg/dL (ref 0.2–1.0)
Calcium, Total: 9.6 mg/dL (ref 8.5–10.1)
Chloride: 96 mmol/L — ABNORMAL LOW (ref 98–107)
Co2: 29 mmol/L (ref 21–32)
Creatinine: 1.32 mg/dL — ABNORMAL HIGH (ref 0.60–1.30)
EGFR (African American): 57 — ABNORMAL LOW
EGFR (Non-African Amer.): 47 — ABNORMAL LOW
Glucose: 307 mg/dL — ABNORMAL HIGH (ref 65–99)
Osmolality: 276 (ref 275–301)
Potassium: 3.7 mmol/L (ref 3.5–5.1)
SGOT(AST): 19 U/L (ref 15–37)
SGPT (ALT): 20 U/L
Sodium: 132 mmol/L — ABNORMAL LOW (ref 136–145)
Total Protein: 8.4 g/dL — ABNORMAL HIGH (ref 6.4–8.2)

## 2014-06-06 LAB — LIPASE, BLOOD: Lipase: 238 U/L (ref 73–393)

## 2014-06-14 ENCOUNTER — Ambulatory Visit: Payer: Self-pay | Admitting: Nurse Practitioner

## 2014-06-17 ENCOUNTER — Ambulatory Visit: Payer: Self-pay | Admitting: Internal Medicine

## 2014-07-13 ENCOUNTER — Ambulatory Visit
Admit: 2014-07-13 | Disposition: A | Discharge status: Home / Self Care | Payer: Self-pay | Attending: Nurse Practitioner | Admitting: Nurse Practitioner

## 2014-11-16 ENCOUNTER — Other Ambulatory Visit: Payer: Self-pay

## 2014-11-23 ENCOUNTER — Other Ambulatory Visit: Payer: Self-pay

## 2014-11-25 ENCOUNTER — Ambulatory Visit: Payer: Self-pay

## 2014-12-22 ENCOUNTER — Emergency Department
Admission: EM | Admit: 2014-12-22 | Discharge: 2014-12-22 | Disposition: A | Discharge status: Home / Self Care | Payer: Self-pay | Attending: Emergency Medicine | Admitting: Emergency Medicine

## 2014-12-22 DIAGNOSIS — J0100 Acute maxillary sinusitis, unspecified: Secondary | ICD-10-CM | POA: Insufficient documentation

## 2014-12-22 DIAGNOSIS — E119 Type 2 diabetes mellitus without complications: Secondary | ICD-10-CM | POA: Insufficient documentation

## 2014-12-22 DIAGNOSIS — Z79899 Other long term (current) drug therapy: Secondary | ICD-10-CM | POA: Insufficient documentation

## 2014-12-22 DIAGNOSIS — Z880 Allergy status to penicillin: Secondary | ICD-10-CM | POA: Insufficient documentation

## 2014-12-22 DIAGNOSIS — J302 Other seasonal allergic rhinitis: Secondary | ICD-10-CM | POA: Insufficient documentation

## 2014-12-22 DIAGNOSIS — J309 Allergic rhinitis, unspecified: Secondary | ICD-10-CM

## 2014-12-22 MED ORDER — IBUPROFEN 800 MG PO TABS
800.0000 mg | ORAL_TABLET | Freq: Once | ORAL | Status: AC
  Administered 2014-12-22: 800 mg via ORAL
  Filled 2014-12-22: qty 1

## 2014-12-22 MED ORDER — LORATADINE 10 MG PO TABS: 10.0000 mg | ORAL_TABLET | Freq: Once | ORAL | Status: DC

## 2014-12-22 MED ORDER — CETIRIZINE HCL 5 MG/5ML PO SYRP: 5.0000 mg | ORAL_SOLUTION | Freq: Once | ORAL | Status: DC

## 2014-12-22 MED ORDER — LORATADINE 10 MG PO TABS
10.0000 mg | ORAL_TABLET | Freq: Once | ORAL | Status: AC
  Administered 2014-12-22: 10 mg via ORAL
  Filled 2014-12-22: qty 1

## 2014-12-22 NOTE — ED Provider Notes (Signed)
[facility] Emergency Department Provider Note  ____________________________________________  Time seen: 4:00 AM  I have reviewed the triage vital signs and the nursing notes.   HISTORY  Chief Complaint Headache     HPI [name] is a 39 y.o. female presents with acute onset of clear rhinorrhea bilateral eye itching and congestion tonight patient states "felt like my allergy symptoms". Patient stated that she then take Benadryl (equate brand) patient states "couple minutes"after doing so she started experiencing facial discomfort predominantly on the right side.    Past Medical History  Diagnosis Date  . Diabetes mellitus without complication     There are no active problems to display for this patient.   Past Surgical History  Procedure Laterality Date  . Cholecystectomy      Current Outpatient Rx  Name  Route  Sig  Dispense  Refill  . glipiZIDE (GLUCOTROL) 10 MG tablet   Oral   Take 10 mg by mouth 2 (two) times daily before a meal.         . Iron TABS   Oral   Take 1 tablet by mouth daily.         . metFORMIN (GLUCOPHAGE) 1000 MG tablet   Oral   Take 1,000 mg by mouth 2 (two) times daily with a meal.         . ranitidine (ZANTAC) 150 MG tablet   Oral   Take 150 mg by mouth 2 (two) times daily.         Marland Kitchen SITagliptin Phosphate (JANUVIA PO)   Oral   Take 1 tablet by mouth daily.         Marland Kitchen VERAPAMIL HCL PO   Oral   Take 1 tablet by mouth daily.         . cyclobenzaprine (FLEXERIL) 10 MG tablet   Oral   Take 1 tablet (10 mg total) by mouth 2 (two) times daily as needed for muscle spasms.   20 tablet   0   . ibuprofen (ADVIL,MOTRIN) 800 MG tablet   Oral   Take 1 tablet (800 mg total) by mouth 3 (three) times daily.   21 tablet   0     Allergies Penicillins  No family history on file.  Social History Social History  Substance Use Topics  . Smoking status: Never Smoker   . Smokeless tobacco:  Never Used  . Alcohol Use: No    Review of Systems  Constitutional: Negative for fever. Eyes: Negative for visual changes. ENT: Negative for sore throat. Positive for facial pain Cardiovascular: Negative for chest pain. Respiratory: Negative for shortness of breath. Gastrointestinal: Negative for abdominal pain, vomiting and diarrhea. Genitourinary: Negative for dysuria. Musculoskeletal: Negative for back pain. Skin: Negative for rash. Neurological: Negative for headaches, focal weakness or numbness.   10-point ROS otherwise negative.  ____________________________________________   PHYSICAL EXAM:  VITAL SIGNS: ED Triage Vitals  Enc Vitals Group     BP --      Pulse --      Resp --      Temp --      Temp src --      SpO2 --      Weight --      Height --      Head Cir --      Peak Flow --      Pain Score --      Pain Loc --      Pain Edu? --  Excl. in GC? --     Constitutional: Alert and oriented. Well appearing and in no distress. Eyes: Conjunctivae are normal. PERRL. Normal extraocular movements. ENT   Head: Normocephalic and atraumatic. Pain with percussion of right maxillary sinus   Nose: No congestion/rhinnorhea.   Mouth/Throat: Mucous membranes are moist.   Neck: No stridor. Cardiovascular: Normal rate, regular rhythm. Normal and symmetric distal pulses are present in all extremities. No murmurs, rubs, or gallops. Respiratory: Normal respiratory effort without tachypnea nor retractions. Breath sounds are clear and equal bilaterally. No wheezes/rales/rhonchi. Gastrointestinal: Soft and nontender. No distention. There is no CVA tenderness. Genitourinary: deferred Musculoskeletal: Nontender with normal range of motion in all extremities. No joint effusions.  No lower extremity tenderness nor edema. Neurologic:  Normal speech and language. No gross focal neurologic deficits are appreciated. Speech is normal.  Skin:  Skin is warm, dry and  intact. No rash noted. Psychiatric: Mood and affect are normal. Speech and behavior are normal. Patient exhibits appropriate insight and judgment.    INITIAL IMPRESSION / ASSESSMENT AND PLAN / ED COURSE  Pertinent labs & imaging results that were available during my care of the patient were reviewed by me and considered in my medical decision making (see chart for details).  History of physical exam consistent with acute allergic sinusitis.   ____________________________________________   FINAL CLINICAL IMPRESSION(S) / ED DIAGNOSES  Final diagnoses:  Allergic sinusitis  Acute maxillary sinusitis, recurrence not specified      Darci Current, MD 12/22/14 0425

## 2014-12-22 NOTE — ED Notes (Signed)
Computer downtime--see paper chart 

## 2014-12-22 NOTE — Discharge Instructions (Signed)

## 2015-01-27 ENCOUNTER — Ambulatory Visit: Payer: Self-pay

## 2015-02-03 ENCOUNTER — Ambulatory Visit: Payer: Self-pay

## 2015-03-03 ENCOUNTER — Other Ambulatory Visit: Payer: Self-pay

## 2015-03-03 LAB — LIPID PANEL
Cholesterol: 168 mg/dL (ref 0–200)
HDL: 33 mg/dL — AB (ref 35–70)
LDL Cholesterol: 105 mg/dL
Triglycerides: 150 mg/dL (ref 40–160)

## 2015-03-10 ENCOUNTER — Ambulatory Visit: Payer: Self-pay

## 2015-03-10 DIAGNOSIS — E119 Type 2 diabetes mellitus without complications: Secondary | ICD-10-CM | POA: Insufficient documentation

## 2015-03-10 DIAGNOSIS — R609 Edema, unspecified: Secondary | ICD-10-CM | POA: Insufficient documentation

## 2015-03-10 DIAGNOSIS — D649 Anemia, unspecified: Secondary | ICD-10-CM | POA: Insufficient documentation

## 2015-03-10 DIAGNOSIS — E1165 Type 2 diabetes mellitus with hyperglycemia: Secondary | ICD-10-CM | POA: Insufficient documentation

## 2015-06-02 ENCOUNTER — Other Ambulatory Visit: Payer: Self-pay

## 2015-06-09 ENCOUNTER — Ambulatory Visit: Payer: Self-pay

## 2015-06-14 ENCOUNTER — Other Ambulatory Visit: Payer: Self-pay

## 2015-06-14 LAB — BASIC METABOLIC PANEL
BUN: 9 mg/dL (ref 4–21)
Creatinine: 0.8 mg/dL (ref 0.5–1.1)
Glucose: 306 mg/dL
Potassium: 4.2 mmol/L (ref 3.4–5.3)
Sodium: 134 mmol/L — AB (ref 137–147)

## 2015-06-14 LAB — CBC AND DIFFERENTIAL
HCT: 33 % — AB (ref 36–46)
Hemoglobin: 9.9 g/dL — AB (ref 12.0–16.0)
Neutrophils Absolute: 4 /uL
Platelets: 406 10*3/uL — AB (ref 150–399)
WBC: 6.8 10^3/mL

## 2015-06-14 LAB — HEPATIC FUNCTION PANEL
ALT: 6 U/L — AB (ref 7–35)
AST: 9 U/L — AB (ref 13–35)
Alkaline Phosphatase: 83 U/L (ref 25–125)
Bilirubin, Total: 0.3 mg/dL

## 2015-06-14 LAB — TSH: TSH: 6.22 u[IU]/mL — AB (ref 0.41–5.90)

## 2015-06-14 LAB — HEMOGLOBIN A1C: Hemoglobin A1C: 9.5

## 2015-06-21 ENCOUNTER — Ambulatory Visit: Payer: Self-pay

## 2015-06-30 ENCOUNTER — Ambulatory Visit: Payer: Self-pay

## 2015-07-01 DIAGNOSIS — E118 Type 2 diabetes mellitus with unspecified complications: Secondary | ICD-10-CM

## 2015-07-01 DIAGNOSIS — R609 Edema, unspecified: Secondary | ICD-10-CM

## 2015-07-01 DIAGNOSIS — D649 Anemia, unspecified: Secondary | ICD-10-CM

## 2015-07-14 ENCOUNTER — Ambulatory Visit: Payer: Self-pay | Admitting: Licensed Clinical Social Worker

## 2015-07-14 ENCOUNTER — Institutional Professional Consult (permissible substitution): Payer: Self-pay | Admitting: Licensed Clinical Social Worker

## 2015-07-21 ENCOUNTER — Ambulatory Visit: Payer: Self-pay | Admitting: Ophthalmology

## 2015-07-26 ENCOUNTER — Telehealth: Payer: Self-pay

## 2015-07-26 NOTE — Telephone Encounter (Signed)
Patient called wants to reschedule missed eye appointment. [name] has new phone number [phone]

## 2015-07-27 NOTE — Telephone Encounter (Signed)
Called patient 9:43 a.m. On 07/27/15. Patient answered, and appointment was rescheduled for 08/04/15 at 2:30 p.m. With Dr. Dorris CarnesShields.

## 2015-08-02 ENCOUNTER — Ambulatory Visit: Payer: Self-pay | Admitting: Endocrinology

## 2015-08-02 VITALS — BP 96/62 | HR 88 | Wt 249.0 lb

## 2015-08-02 DIAGNOSIS — E1165 Type 2 diabetes mellitus with hyperglycemia: Secondary | ICD-10-CM

## 2015-08-02 MED ORDER — METFORMIN HCL ER 500 MG PO TB24: 1000.0000 mg | ORAL_TABLET | Freq: Two times a day (BID) | ORAL | Status: DC

## 2015-08-02 NOTE — Progress Notes (Signed)
Subjective:     Patient ID: [name], female   DOB: 1975-08-01, 40 y.o.   MRN: [id number]  HPI Diagnosed with Type II in 2009. Checks blood sugar twice a day. Reported average of 250.   Reports hypoglyemia a few times a week. Feels "loopy" at high 80s. Doesn't report any pattern.  Skips breakfast. Drinks apple juice and orange juice. Couple of times a week feels nauseated in the morning. Started a few weeks ago. Notices that has spikes in blood sugar and then followed by lows.   Highest blood sugar reported 349. Before Venezuelajanuvia this was "normal". Started Venezuelajanuvia 6 months ago.   Reports running stools for approximately 6-7 months. Metformin was started a while before. Denies polyuria. States neuropathy for months . History of depression and did not want to take Daisy BlossomLereka because of this.        Review of Systems       Objective:   Physical Exam CV: normal s1/s2. RRR. No m/r/g Pulm: CTAB Neuro: Reduced sensation on monofilament test. 3/5    Assessment:          Plan:     Per Dr. Rosalin HawkingKirkman's Note     Hypoglycemia: Patient checks blood sugars twice a day at 7/7. In the morning, reports 250 for blood sugar. In the middle of the day 120-145. Will skip meals in the morning if feeling naseated. Had a bout of hypoglycemia in [place] and checked blood sugar, which was around 100. This was worrisome for the patient. Reduce glipizide to PO at night, one 10mg  at supper only. Takes glipizide after eating.   Diarrhea: Reports loose stools several times a week. Switch to metformin XR. Take 500 mg BID.

## 2015-08-02 NOTE — Progress Notes (Signed)
Patient's primary care provider: [facility]   [name] returns for follow-up of type 2 diabetes.    Other problems include:  Patient Active Problem List   Diagnosis Date Noted  . Diabetes (HCC) 03/10/2015  . Anemia 03/10/2015  . Edema 03/10/2015    Her current medications include: Current Outpatient Prescriptions  Medication Sig Dispense Refill  . glipiZIDE (GLUCOTROL) 10 MG tablet Take 10 mg by mouth 2 (two) times daily before a meal.    . ibuprofen (ADVIL,MOTRIN) 800 MG tablet Take 1 tablet (800 mg total) by mouth 3 (three) times daily. (Patient taking differently: Take 800 mg by mouth every 8 (eight) hours as needed. ) 21 tablet 0  . ranitidine (ZANTAC) 150 MG tablet Take 150 mg by mouth once.     Marland Kitchen. SITagliptin Phosphate (JANUVIA PO) Take 1 tablet by mouth daily.    Marland Kitchen. VERAPAMIL HCL PO Take 1 tablet by mouth daily.    . cyclobenzaprine (FLEXERIL) 10 MG tablet Take 1 tablet (10 mg total) by mouth 2 (two) times daily as needed for muscle spasms. (Patient not taking: Reported on 08/02/2015) 20 tablet 0  . Iron TABS Take 1 tablet by mouth daily. Reported on 08/02/2015    . metFORMIN (GLUCOPHAGE-XR) 500 MG 24 hr tablet Take 2 tablets (1,000 mg total) by mouth 2 (two) times daily before a meal. 360 tablet 3   No current facility-administered medications for this visit.    Since the last visit, she has been taking Januvia 100 mg daily, metformin 1000 mg twice daily, and glipizide 10 mg twice daily.  Complains of diarrhea and nausea with metformin.  Occasional symptoms of hypoglycemia with BG in 80's during day.   Exam:  BP 96/62 mmHg  Pulse 88  Wt 249 lb (112.946 kg) Constitutional: Obese, Alert, oriented, in NAD Remainder of PE per triage note   Recent labs:  Results for orders placed or performed in visit on 07/01/15  CBC and differential  Result Value Ref Range   Hemoglobin 9.9 (A) 12.0 - 16.0 g/dL   HCT 33 (A) 36 - 46 %   Neutrophils Absolute 4 /L    Platelets 406 (A) 150 - 399 K/L   WBC 6.8 10^3/mL  Basic metabolic panel  Result Value Ref Range   Glucose 306 mg/dL   BUN 9 4 - 21 mg/dL   Creatinine 0.8 0.5 - 1.1 mg/dL   Potassium 4.2 3.4 - 5.3 mmol/L   Sodium 134 (A) 137 - 147 mmol/L  Lipid panel  Result Value Ref Range   Triglycerides 150 40 - 160 mg/dL   Cholesterol 161168 0 - 096200 mg/dL   HDL 33 (A) 35 - 70 mg/dL   LDL Cholesterol 045105 mg/dL  Hepatic function panel  Result Value Ref Range   Alkaline Phosphatase 83 25 - 125 U/L   ALT 6 (A) 7 - 35 U/L   AST 9 (A) 13 - 35 U/L   Bilirubin, Total 0.3 mg/dL  Hemoglobin [id number]  Result Value Ref Range   Hemoglobin A1C 9.5   TSH  Result Value Ref Range   TSH 6.22 (A) 0.41 - 5.90 uIU/mL    Assessment and Plan:    1. Type 2 diabetes mellitus with hyperglycemia, without long-term current use of insulin (HCC)  She has what still sounds like inadequately controlled diabetes. However, also has some symptomatic low-ish blood sugars, probably from the glipizide.  Will try her on extended release metformin: - metFORMIN (GLUCOPHAGE-XR) 500 MG  24 hr tablet; Take 2 tablets (1,000 mg total) by mouth 2 (two) times daily before a meal.  Dispense: 360 tablet; Refill: 3  Reduce glipizide to 10 mg daily before dinner.   Return in about 3 months (around 11/02/2015).  In the interim, she was instructed to contact the clinic for any refill needs or any problems related to her diabetes.

## 2015-08-04 ENCOUNTER — Ambulatory Visit: Payer: Self-pay | Admitting: Ophthalmology

## 2015-08-25 ENCOUNTER — Ambulatory Visit: Payer: Self-pay | Admitting: Ophthalmology

## 2015-09-15 ENCOUNTER — Ambulatory Visit: Payer: Self-pay

## 2015-09-22 ENCOUNTER — Ambulatory Visit: Payer: Self-pay | Admitting: Urology

## 2015-09-22 VITALS — BP 119/56 | HR 105 | Temp 98.1°F | Resp 16 | Ht 68.0 in | Wt 233.0 lb

## 2015-09-22 DIAGNOSIS — N946 Dysmenorrhea, unspecified: Secondary | ICD-10-CM

## 2015-09-22 NOTE — Progress Notes (Signed)
  Patient: Hannah KirksRadonna Nicole Cajamarca Female    DOB: 10/19/1975   40 y.o.   MRN: [id number] Visit Date: 09/22/2015  Today's Provider: [facility]   Chief Complaint  Patient presents with  . Dysmenorrhea   Subjective:    HPI Patient is a 40 year old [place] female who presents with dysmenorrhea for the last 2 months.  She is now having irregular periods with passage of large blood clots.  She is has to constantly wear pads in case of her periods starts.  She is having an increase of fatigue.    She has now developed diarrhea with the Glucophage-XR.  She is seeing the capsules in her stool.    Allergies  Allergen Reactions  . Penicillins Hives   Previous Medications   CYCLOBENZAPRINE (FLEXERIL) 10 MG TABLET    Take 1 tablet (10 mg total) by mouth 2 (two) times daily as needed for muscle spasms.   GLIPIZIDE (GLUCOTROL) 10 MG TABLET    Take 10 mg by mouth 2 (two) times daily before a meal.   IBUPROFEN (ADVIL,MOTRIN) 800 MG TABLET    Take 1 tablet (800 mg total) by mouth 3 (three) times daily.   IRON TABS    Take 1 tablet by mouth daily. Reported on 08/02/2015   METFORMIN (GLUCOPHAGE-XR) 500 MG 24 HR TABLET    Take 2 tablets (1,000 mg total) by mouth 2 (two) times daily before a meal.   RANITIDINE (ZANTAC) 150 MG TABLET    Take 150 mg by mouth once.    SITAGLIPTIN PHOSPHATE (JANUVIA PO)    Take 1 tablet by mouth daily.   VERAPAMIL HCL PO    Take 1 tablet by mouth daily.    Review of Systems  Constitutional: Positive for fatigue.  Eyes: Negative for pain, discharge and itching.  Respiratory: Negative for cough and shortness of breath.   Cardiovascular: Negative for palpitations.  Gastrointestinal: Positive for diarrhea. Negative for abdominal pain, blood in stool and anal bleeding.  Endocrine: Positive for cold intolerance. Negative for heat intolerance.  Genitourinary: Negative for frequency, flank pain and enuresis.    Social History  Substance Use Topics  .  Smoking status: Never Smoker   . Smokeless tobacco: Never Used  . Alcohol Use: No   Objective:   BP 119/56 mmHg  Pulse 105  Temp(Src) 98.1 F (36.7 C)  Resp 16  Ht 5\' 8"  (1.727 m)  Wt 233 lb (105.688 kg)  BMI 35.44 kg/m2  SpO2 99%  Physical Exam Constitutional: Well nourished. Alert and oriented, No acute distress. HEENT: [place] AT, moist mucus membranes. Trachea midline, no masses. Cardiovascular: No clubbing, cyanosis, or edema. Respiratory: Normal respiratory effort, no increased work of breathing. GI: Abdomen is soft, non tender, non distended, no abdominal masses.  GU: No CVA tenderness.  No bladder fullness or masses.   Skin: No rashes, bruises or suspicious lesions. Lymph: No cervical or inguinal adenopathy. Neurologic: Grossly intact, no focal deficits, moving all 4 extremities. Psychiatric: Normal mood and affect.     Assessment & Plan:     1. Dysmenorrhea  Check CBC and pregnancy test  Contact pharmacy if pregnancy test is negative for estrogen dose with phenergan             [facility] and the high dose prescription of estrogen is cost prohibitive     [facility]   [facility] [place]

## 2015-09-23 LAB — CBC WITH DIFFERENTIAL/PLATELET
Basophils Absolute: 0 10*3/uL (ref 0.0–0.2)
Basos: 0 %
EOS (ABSOLUTE): 0.2 10*3/uL (ref 0.0–0.4)
Eos: 2 %
Hematocrit: 31 % — ABNORMAL LOW (ref 34.0–46.6)
Hemoglobin: 9.7 g/dL — ABNORMAL LOW (ref 11.1–15.9)
Immature Grans (Abs): 0 10*3/uL (ref 0.0–0.1)
Immature Granulocytes: 0 %
Lymphocytes Absolute: 2 10*3/uL (ref 0.7–3.1)
Lymphs: 21 %
MCH: 23.6 pg — ABNORMAL LOW (ref 26.6–33.0)
MCHC: 31.3 g/dL — ABNORMAL LOW (ref 31.5–35.7)
MCV: 75 fL — ABNORMAL LOW (ref 79–97)
Monocytes Absolute: 0.5 10*3/uL (ref 0.1–0.9)
Monocytes: 5 %
Neutrophils Absolute: 7.1 10*3/uL — ABNORMAL HIGH (ref 1.4–7.0)
Neutrophils: 72 %
Platelets: 408 10*3/uL — ABNORMAL HIGH (ref 150–379)
RBC: 4.11 x10E6/uL (ref 3.77–5.28)
RDW: 15 % (ref 12.3–15.4)
WBC: 9.9 10*3/uL (ref 3.4–10.8)

## 2015-09-23 LAB — HCG, SERUM, QUALITATIVE: hCG,Beta Subunit,Qual,Serum: NEGATIVE m[IU]/mL (ref <6)

## 2015-09-27 ENCOUNTER — Ambulatory Visit: Payer: Self-pay

## 2015-09-29 ENCOUNTER — Encounter: Payer: Self-pay | Admitting: Pharmacist

## 2015-09-30 ENCOUNTER — Telehealth: Payer: Self-pay | Admitting: Urology

## 2015-09-30 NOTE — Telephone Encounter (Signed)
I saw [name] last week and she was having heavy menstrual bleeding.  High doses of estrogen is recommended, but the medication will cost ~$400.00 for the prescription.  She has [facility]  Do we still utilize the health department for abnormal gynecological issues?

## 2015-10-06 ENCOUNTER — Encounter: Payer: Self-pay | Admitting: Pharmacist

## 2015-10-06 ENCOUNTER — Other Ambulatory Visit: Payer: Self-pay | Admitting: Urology

## 2015-10-13 ENCOUNTER — Other Ambulatory Visit: Payer: Self-pay

## 2015-10-13 DIAGNOSIS — D649 Anemia, unspecified: Secondary | ICD-10-CM

## 2015-10-13 DIAGNOSIS — E118 Type 2 diabetes mellitus with unspecified complications: Secondary | ICD-10-CM

## 2015-10-14 LAB — COMPREHENSIVE METABOLIC PANEL
ALT: 9 IU/L (ref 0–32)
AST: 7 IU/L (ref 0–40)
Albumin/Globulin Ratio: 1.4 (ref 1.2–2.2)
Albumin: 4 g/dL (ref 3.5–5.5)
Alkaline Phosphatase: 79 IU/L (ref 39–117)
BUN/Creatinine Ratio: 8 — ABNORMAL LOW (ref 9–23)
BUN: 6 mg/dL (ref 6–24)
Bilirubin Total: 0.3 mg/dL (ref 0.0–1.2)
CO2: 24 mmol/L (ref 18–29)
Calcium: 9.3 mg/dL (ref 8.7–10.2)
Chloride: 99 mmol/L (ref 96–106)
Creatinine, Ser: 0.72 mg/dL (ref 0.57–1.00)
GFR calc Af Amer: 119 mL/min/{1.73_m2} (ref >59)
GFR calc non Af Amer: 104 mL/min/{1.73_m2} (ref >59)
Globulin, Total: 2.8 g/dL (ref 1.5–4.5)
Glucose: 171 mg/dL — ABNORMAL HIGH (ref 65–99)
Potassium: 4 mmol/L (ref 3.5–5.2)
Sodium: 138 mmol/L (ref 134–144)
Total Protein: 6.8 g/dL (ref 6.0–8.5)

## 2015-10-14 LAB — CBC WITH DIFFERENTIAL/PLATELET
Basophils Absolute: 0 10*3/uL (ref 0.0–0.2)
Basos: 0 %
EOS (ABSOLUTE): 0.2 10*3/uL (ref 0.0–0.4)
Eos: 2 %
Hematocrit: 29.8 % — ABNORMAL LOW (ref 34.0–46.6)
Hemoglobin: 9.2 g/dL — ABNORMAL LOW (ref 11.1–15.9)
Immature Grans (Abs): 0 10*3/uL (ref 0.0–0.1)
Immature Granulocytes: 0 %
Lymphocytes Absolute: 2 10*3/uL (ref 0.7–3.1)
Lymphs: 22 %
MCH: 22.6 pg — ABNORMAL LOW (ref 26.6–33.0)
MCHC: 30.9 g/dL — ABNORMAL LOW (ref 31.5–35.7)
MCV: 73 fL — ABNORMAL LOW (ref 79–97)
Monocytes Absolute: 0.6 10*3/uL (ref 0.1–0.9)
Monocytes: 7 %
Neutrophils Absolute: 6 10*3/uL (ref 1.4–7.0)
Neutrophils: 69 %
Platelets: 424 10*3/uL — ABNORMAL HIGH (ref 150–379)
RBC: 4.07 x10E6/uL (ref 3.77–5.28)
RDW: 15.3 % (ref 12.3–15.4)
WBC: 8.8 10*3/uL (ref 3.4–10.8)

## 2015-10-14 LAB — LIPID PANEL
Chol/HDL Ratio: 4.3 ratio units (ref 0.0–4.4)
Cholesterol, Total: 150 mg/dL (ref 100–199)
HDL: 35 mg/dL — ABNORMAL LOW (ref >39)
LDL Calculated: 91 mg/dL (ref 0–99)
Triglycerides: 120 mg/dL (ref 0–149)
VLDL Cholesterol Cal: 24 mg/dL (ref 5–40)

## 2015-10-14 LAB — HEMOGLOBIN A1C
Est. average glucose Bld gHb Est-mCnc: 180 mg/dL
Hgb A1c MFr Bld: 7.9 % — ABNORMAL HIGH (ref 4.8–5.6)

## 2015-10-20 ENCOUNTER — Ambulatory Visit: Payer: Self-pay

## 2015-10-27 ENCOUNTER — Ambulatory Visit: Payer: Self-pay | Admitting: Nurse Practitioner

## 2015-10-27 VITALS — BP 100/65 | HR 81 | Temp 98.8°F | Resp 16 | Ht 68.0 in | Wt 252.0 lb

## 2015-10-27 DIAGNOSIS — N926 Irregular menstruation, unspecified: Secondary | ICD-10-CM

## 2015-10-27 DIAGNOSIS — E782 Mixed hyperlipidemia: Secondary | ICD-10-CM

## 2015-10-27 DIAGNOSIS — D500 Iron deficiency anemia secondary to blood loss (chronic): Secondary | ICD-10-CM

## 2015-10-27 DIAGNOSIS — N946 Dysmenorrhea, unspecified: Secondary | ICD-10-CM

## 2015-10-27 DIAGNOSIS — E118 Type 2 diabetes mellitus with unspecified complications: Secondary | ICD-10-CM

## 2015-10-27 LAB — GLUCOSE, POCT (MANUAL RESULT ENTRY): POC Glucose: 206 mg/dl — AB (ref 70–99)

## 2015-10-27 MED ORDER — NAPROXEN 500 MG PO TABS: 500.0000 mg | ORAL_TABLET | Freq: Two times a day (BID) | ORAL | Status: DC

## 2015-10-27 NOTE — Progress Notes (Signed)
   Subjective:    Patient ID: [name], female    DOB: Aug 31, 1975, 40 y.o.   MRN: [id number]  HPI  Here today in office for dm and lab follow up, dm     Review of Systems BLE edema.      Objective:   Physical Exam  Alert verbally appropriate, ? If hair thinning at hairline.  Heart rate in 60's and last tsh elevated.    Swelling of ankles, with trace pitting edema, ending well below mid shin.       Assessment & Plan:    Dm control is much improved, with a1c down from 9.5 to 7.9.  Likely good response to [place]ajanuvia  Will not change any meds at this time, will recheck a1c in 3months Will also check cmp Lipids, will continue to monitor  Anemia, pt to continue on her ferrous gluconate, and will reassess in 3 months  Dysmenorrhea, will implement naproxen at 500 mg bid as needed  Irregular menses, will check fsh tonight.    Thinning hair, will order thyroid profile and address as indicated  Will do general maintenance labs in 3 months.    Edema, pt is normotensive, so  No diuretic, and well assess thyroid values.

## 2015-10-28 LAB — THYROID PANEL WITH TSH
Free Thyroxine Index: 1.8 (ref 1.2–4.9)
T3 Uptake Ratio: 30 % (ref 24–39)
T4, Total: 5.9 ug/dL (ref 4.5–12.0)
TSH: 2.58 u[IU]/mL (ref 0.450–4.500)

## 2015-10-28 LAB — FOLLICLE STIMULATING HORMONE: FSH: 10.9 m[IU]/mL

## 2015-11-03 ENCOUNTER — Telehealth: Payer: Self-pay

## 2015-11-03 NOTE — Telephone Encounter (Signed)
Left message stating she needs refill on Januvia.  Was told by [facility] to call us to get it refilled.  Was unable to place reorder under the meds and orders screen due to it being entered under historical provider and was unable to switch it.

## 2015-11-10 ENCOUNTER — Ambulatory Visit: Payer: Self-pay | Admitting: Obstetrics & Gynecology

## 2015-11-10 DIAGNOSIS — N921 Excessive and frequent menstruation with irregular cycle: Secondary | ICD-10-CM | POA: Insufficient documentation

## 2015-11-10 DIAGNOSIS — Z8742 Personal history of other diseases of the female genital tract: Secondary | ICD-10-CM

## 2015-11-10 NOTE — Progress Notes (Signed)
Subjective:     Patient ID: [name], female   DOB: [birth year], 40 y.o.   MRN: [id number]  HPI Pt is a 40 yo G0 AA F with c/o irreg periods over the last year that have been progressively becoming longer in duration although daily amount of bleeding has lessened.  Pt desribes worse pain when cycling.  Also has change in BMs as loose and dark during cycles.  Pain is in lower abd and rad to legs.  Vag itching and hot flashes also have been recently noticed.  No prior surgeries in abdomen other than gall bladder.  No pregnancies.  Reports never been sexually active.  Review of Systems  Constitutional: Positive for fatigue.  HENT: Negative.   Respiratory: Negative.   Cardiovascular: Negative.   Gastrointestinal: Negative.   Genitourinary: Positive for menstrual problem and pelvic pain.  Musculoskeletal: Negative.   Skin: Negative.   Neurological: Negative.   Hematological: Negative.   Psychiatric/Behavioral: Negative.   I   Objective:   Physical Exam  Constitutional: She is oriented to person, place, and time. She appears well-developed and well-nourished.  Abdominal: Soft. She exhibits no distension and no mass. There is no tenderness.  Limited by obesity  Genitourinary: Vagina normal.  Narrow vagina, tender, unable to place speculum based on pt discomfort Cervix on BIMANUAL exam small, no mass Uterus difficult to assess due to obesity  Neurological: She is alert and oriented to person, place, and time.  Skin: Skin is intact.       Assessment:     Perimenopausal bleeding vs fibroids or hormonal imbalance Pain on exam from vaginismus    Plan:     Rec GYN Ultrasound for anatomical assessment (fibroid, polyp, adenomyosis) If neg, then can consider hormonal treatment If pos, then may need surgical referral No other causes (blood thinner therapy, blood d/o, etc) that would be high on list. PAP unable to be done today due to viginal exam and pain/inability to place  speculum.

## 2015-11-13 ENCOUNTER — Other Ambulatory Visit: Payer: Self-pay | Admitting: Urology

## 2015-11-24 ENCOUNTER — Ambulatory Visit: Payer: Self-pay

## 2015-11-25 ENCOUNTER — Ambulatory Visit
Admission: RE | Admit: 2015-11-25 | Discharge: 2015-11-25 | Disposition: A | Discharge status: Home / Self Care | Payer: Self-pay | Source: Ambulatory Visit | Attending: Obstetrics & Gynecology | Admitting: Obstetrics & Gynecology

## 2015-11-25 DIAGNOSIS — Z8742 Personal history of other diseases of the female genital tract: Secondary | ICD-10-CM | POA: Insufficient documentation

## 2015-12-06 ENCOUNTER — Other Ambulatory Visit: Payer: Self-pay | Admitting: Urology

## 2015-12-06 MED ORDER — SITAGLIPTIN PHOSPHATE 50 MG PO TABS: 50.0000 mg | ORAL_TABLET | Freq: Every day | ORAL | 0 refills | Status: DC

## 2015-12-06 NOTE — Telephone Encounter (Signed)
Done

## 2016-02-01 ENCOUNTER — Other Ambulatory Visit: Payer: Self-pay | Admitting: Internal Medicine

## 2016-02-09 ENCOUNTER — Other Ambulatory Visit: Payer: Self-pay

## 2016-02-09 DIAGNOSIS — D500 Iron deficiency anemia secondary to blood loss (chronic): Secondary | ICD-10-CM

## 2016-02-09 DIAGNOSIS — E782 Mixed hyperlipidemia: Secondary | ICD-10-CM

## 2016-02-09 DIAGNOSIS — E118 Type 2 diabetes mellitus with unspecified complications: Secondary | ICD-10-CM

## 2016-02-10 LAB — COMPREHENSIVE METABOLIC PANEL
ALT: 13 IU/L (ref 0–32)
AST: 12 IU/L (ref 0–40)
Albumin/Globulin Ratio: 1.2 (ref 1.2–2.2)
Albumin: 4 g/dL (ref 3.5–5.5)
Alkaline Phosphatase: 89 IU/L (ref 39–117)
BUN/Creatinine Ratio: 15 (ref 9–23)
BUN: 11 mg/dL (ref 6–24)
Bilirubin Total: 0.3 mg/dL (ref 0.0–1.2)
CO2: 26 mmol/L (ref 18–29)
Calcium: 9.9 mg/dL (ref 8.7–10.2)
Chloride: 95 mmol/L — ABNORMAL LOW (ref 96–106)
Creatinine, Ser: 0.73 mg/dL (ref 0.57–1.00)
GFR calc Af Amer: 117 mL/min/{1.73_m2} (ref >59)
GFR calc non Af Amer: 102 mL/min/{1.73_m2} (ref >59)
Globulin, Total: 3.3 g/dL (ref 1.5–4.5)
Glucose: 139 mg/dL — ABNORMAL HIGH (ref 65–99)
Potassium: 4.2 mmol/L (ref 3.5–5.2)
Sodium: 137 mmol/L (ref 134–144)
Total Protein: 7.3 g/dL (ref 6.0–8.5)

## 2016-02-10 LAB — CBC
Hematocrit: 31.9 % — ABNORMAL LOW (ref 34.0–46.6)
Hemoglobin: 10 g/dL — ABNORMAL LOW (ref 11.1–15.9)
MCH: 22.8 pg — ABNORMAL LOW (ref 26.6–33.0)
MCHC: 31.3 g/dL — ABNORMAL LOW (ref 31.5–35.7)
MCV: 73 fL — ABNORMAL LOW (ref 79–97)
Platelets: 416 10*3/uL — ABNORMAL HIGH (ref 150–379)
RBC: 4.38 x10E6/uL (ref 3.77–5.28)
RDW: 15.8 % — ABNORMAL HIGH (ref 12.3–15.4)
WBC: 8.4 10*3/uL (ref 3.4–10.8)

## 2016-02-10 LAB — LIPID PANEL
Chol/HDL Ratio: 4.8 ratio units — ABNORMAL HIGH (ref 0.0–4.4)
Cholesterol, Total: 169 mg/dL (ref 100–199)
HDL: 35 mg/dL — ABNORMAL LOW (ref >39)
LDL Calculated: 109 mg/dL — ABNORMAL HIGH (ref 0–99)
Triglycerides: 125 mg/dL (ref 0–149)
VLDL Cholesterol Cal: 25 mg/dL (ref 5–40)

## 2016-02-10 LAB — HEMOGLOBIN A1C
Est. average glucose Bld gHb Est-mCnc: 217 mg/dL
Hgb A1c MFr Bld: 9.2 % — ABNORMAL HIGH (ref 4.8–5.6)

## 2016-02-28 ENCOUNTER — Telehealth: Payer: Self-pay

## 2016-02-28 NOTE — Telephone Encounter (Signed)
Pt called in saying she missed a call from us. Called pt back and left msg with appt details.

## 2016-03-08 ENCOUNTER — Ambulatory Visit: Payer: Self-pay | Admitting: Adult Health Nurse Practitioner

## 2016-03-08 VITALS — BP 116/58 | HR 87 | Ht 68.0 in | Wt 249.0 lb

## 2016-03-08 DIAGNOSIS — E1165 Type 2 diabetes mellitus with hyperglycemia: Secondary | ICD-10-CM

## 2016-03-08 DIAGNOSIS — E119 Type 2 diabetes mellitus without complications: Secondary | ICD-10-CM

## 2016-03-08 LAB — GLUCOSE, POCT (MANUAL RESULT ENTRY): POC Glucose: 386 mg/dl — AB (ref 70–99)

## 2016-03-08 MED ORDER — OMEPRAZOLE 20 MG PO CPDR: 20.0000 mg | DELAYED_RELEASE_CAPSULE | Freq: Every day | ORAL | 0 refills | Status: DC

## 2016-03-08 MED ORDER — METFORMIN HCL ER 500 MG PO TB24: 1000.0000 mg | ORAL_TABLET | Freq: Two times a day (BID) | ORAL | 3 refills | Status: DC

## 2016-03-08 NOTE — Progress Notes (Signed)
Patient: [name] Female    DOB: 1975-12-08   40 y.o.   MRN: [id number] Visit Date: 03/08/2016  Today's Provider: [facility]   Chief Complaint  Patient presents with  . Follow-up   Subjective:    HPI     Pain in stomach for two weeks.  Feels "raw and throbbing" Tried tums with no relief.  Pain is constant-rated 7/10.       Pt reports diarrhea-3x daily. No hematochezia.  Denies vomiting or nausea.   DM:  A1C 9.2. Up form 7.9.   Taking medications as directed.  Trying to monitor diet.  Exercising-casual walking in the am.     Allergies  Allergen Reactions  . Penicillins Hives   Previous Medications   GLIPIZIDE (GLUCOTROL) 10 MG TABLET    TAKE ONE TABLET BY MOUTH 2 TIMES A DAY   IBUPROFEN (ADVIL,MOTRIN) 800 MG TABLET    Take 1 tablet (800 mg total) by mouth 3 (three) times daily.   IRON TABS    Take 1 tablet by mouth daily. Reported on 08/02/2015   METFORMIN (GLUCOPHAGE-XR) 500 MG 24 HR TABLET    Take 2 tablets (1,000 mg total) by mouth 2 (two) times daily before a meal.   NAPROXEN (NAPROSYN) 500 MG TABLET    Take 1 tablet (500 mg total) by mouth 2 (two) times daily with a meal. One tablet, by mouth, with food, twice a day as needed for cramping   RANITIDINE (ZANTAC) 150 MG TABLET    Take 150 mg by mouth once.    SITAGLIPTIN PHOSPHATE (JANUVIA PO)    Take 1 tablet by mouth daily.   VERAPAMIL (CALAN-SR) 240 MG CR TABLET    TAKE 1/2 TABLET (120 MG) BY MOUTH EVERY DAY    Review of Systems  All other systems reviewed and are negative.   Social History  Substance Use Topics  . Smoking status: Never Smoker  . Smokeless tobacco: Never Used  . Alcohol use No   Objective:   BP (!) 116/58   Pulse 87   Ht 5\' 8"  (1.727 m)   Wt 249 lb (112.9 kg)   SpO2 98%   BMI 37.86 kg/m   Physical Exam  Constitutional: She is oriented to person, place, and time. She appears well-developed and well-nourished.  HENT:  Head: Normocephalic and atraumatic.   Eyes: Pupils are equal, round, and reactive to light.  Neck: Normal range of motion. Neck supple.  Cardiovascular: Normal rate, regular rhythm and normal heart sounds.   Pulmonary/Chest: Effort normal and breath sounds normal.  Abdominal: Soft. Bowel sounds are normal. She exhibits no distension and no mass. There is tenderness. There is no rebound and no guarding.  Mild right epigastric tenderness.   Neurological: She is alert and oriented to person, place, and time.  Skin: Skin is warm and dry.  Psychiatric: She has a normal mood and affect.  Vitals reviewed.       Assessment & Plan:          LDL-109- recommend fish or flax seed oil to trial.   DM:   Not controlled.  Encourage diabetic diet and exercise.  Continue current medication regimen.  Discussed insulin therapy-recommended extensive diet changes and exercise to bring A1C down.  If not improved by next visit will start insulin therapy.  Recommended the endo clinic.    ABD PAIN:  Recommend BRAT diet. X 3days.  Clear liquids.  PPI for 2 weeks.  FU precautions given.  If not improved will obtain KUB.    Mammogram needed.   Labs reviewed.    [facility]   [facility] [place]

## 2016-04-26 ENCOUNTER — Other Ambulatory Visit: Payer: Self-pay | Admitting: Urology

## 2016-04-26 ENCOUNTER — Other Ambulatory Visit: Payer: Self-pay

## 2016-04-26 DIAGNOSIS — E1165 Type 2 diabetes mellitus with hyperglycemia: Secondary | ICD-10-CM

## 2016-04-27 LAB — HEMOGLOBIN A1C
Est. average glucose Bld gHb Est-mCnc: 235 mg/dL
Hgb A1c MFr Bld: 9.8 % — ABNORMAL HIGH (ref 4.8–5.6)

## 2016-04-27 LAB — COMPREHENSIVE METABOLIC PANEL
ALT: 11 IU/L (ref 0–32)
AST: 11 IU/L (ref 0–40)
Albumin/Globulin Ratio: 1.4 (ref 1.2–2.2)
Albumin: 4.2 g/dL (ref 3.5–5.5)
Alkaline Phosphatase: 94 IU/L (ref 39–117)
BUN/Creatinine Ratio: 8 — ABNORMAL LOW (ref 9–23)
BUN: 7 mg/dL (ref 6–24)
Bilirubin Total: 0.3 mg/dL (ref 0.0–1.2)
CO2: 22 mmol/L (ref 18–29)
Calcium: 9.5 mg/dL (ref 8.7–10.2)
Chloride: 97 mmol/L (ref 96–106)
Creatinine, Ser: 0.86 mg/dL (ref 0.57–1.00)
GFR calc Af Amer: 96 mL/min/{1.73_m2} (ref >59)
GFR calc non Af Amer: 84 mL/min/{1.73_m2} (ref >59)
Globulin, Total: 3 g/dL (ref 1.5–4.5)
Glucose: 284 mg/dL — ABNORMAL HIGH (ref 65–99)
Potassium: 4 mmol/L (ref 3.5–5.2)
Sodium: 138 mmol/L (ref 134–144)
Total Protein: 7.2 g/dL (ref 6.0–8.5)

## 2016-04-27 LAB — MICROALBUMIN / CREATININE URINE RATIO
Creatinine, Urine: 226.8 mg/dL
Microalb/Creat Ratio: 5.3 mg/g creat (ref 0.0–30.0)
Microalbumin, Urine: 12.1 ug/mL

## 2016-05-01 ENCOUNTER — Telehealth: Payer: Self-pay

## 2016-05-01 ENCOUNTER — Ambulatory Visit: Payer: Self-pay | Admitting: Physician Assistant

## 2016-05-01 DIAGNOSIS — F329 Major depressive disorder, single episode, unspecified: Secondary | ICD-10-CM | POA: Insufficient documentation

## 2016-05-01 DIAGNOSIS — F32A Depression, unspecified: Secondary | ICD-10-CM | POA: Insufficient documentation

## 2016-05-01 DIAGNOSIS — E118 Type 2 diabetes mellitus with unspecified complications: Secondary | ICD-10-CM

## 2016-05-01 DIAGNOSIS — F3341 Major depressive disorder, recurrent, in partial remission: Secondary | ICD-10-CM

## 2016-05-01 DIAGNOSIS — E669 Obesity, unspecified: Secondary | ICD-10-CM | POA: Insufficient documentation

## 2016-05-01 LAB — GLUCOSE, POCT (MANUAL RESULT ENTRY): POC Glucose: 219 mg/dl — AB (ref 70–99)

## 2016-05-01 MED ORDER — LIRAGLUTIDE 18 MG/3ML ~~LOC~~ SOPN: PEN_INJECTOR | SUBCUTANEOUS | 0 refills | Status: DC

## 2016-05-01 NOTE — Telephone Encounter (Signed)
Rec'd PAP application for [facility].

## 2016-05-01 NOTE — Progress Notes (Unsigned)
Assessment:   Type 2 diabetes mellitus with complication, without long-term current use of insulin (HCC)      Plan:    1.  Rx changes: Patient was taking the metformin incorrectly. Have counseled to take metformin BID 1000mg .   Discontinue the Januvia ONCE she has received the Victoza from [facility]. Then titrate up the Victoza from 1.2 mg to 1.8 mg as tolerated. Increasing by 0.3 mg each week.   Continue to take the Glipizide as prescribed.   2. Counseled on the possibility of using insulin in the future. Discussed why insulin has a "negative connotation". Patient scared that once she starts insulin, she will not be able to ever come off of insulin and have weight gain. Also worried about hypoglycemia.  3. Patient reported fasciculations that were bothersome. Will continue to monitor.   4. Follow up: I recommend patient follow-up with me at 2 months.      Subjective:    [name] is a 40 y.o. female who is seen in follow up forType 2 diabetes from 2009.    complicated by nephropathy, obesity and dyslipidemia and neuropathy  Cardiovascular risk factors: dyslipidemia, obesity and physical inactivity. History of depression that inhibits physical activity.     Checking blood sugars once in the morning and once in the evening. Blood sugar is running highest at the night. In the morning it is in 250 and in the evening ranges from 290-350.  Eats sporadically.   Patient did not bring the meter.   [name] is performing SMBG on average 2-3 times per day.  Denies hypoglycemia, polyuria, polydipsia, polyphagia and weight loss.   Denies chest pain, shortness of breath, edema, foot lesions or ulcers.  Denies severe hypoglycemia or admission to hospital for DKA.  Current exercise:   The patient's history was reviewed and updated as appropriate.  Allergies  Allergen Reactions   Penicillins Hives     Current Outpatient Prescriptions:    glipiZIDE  (GLUCOTROL) 10 MG tablet, TAKE ONE TABLET BY MOUTH 2 TIMES A DAY, Disp: 180 tablet, Rfl: 0   ibuprofen (ADVIL,MOTRIN) 800 MG tablet, Take 1 tablet (800 mg total) by mouth 3 (three) times daily. (Patient taking differently: Take 800 mg by mouth every 8 (eight) hours as needed. ), Disp: 21 tablet, Rfl: 0   Iron TABS, Take 1 tablet by mouth daily. Reported on 08/02/2015, Disp: , Rfl:    metFORMIN (GLUCOPHAGE-XR) 500 MG 24 hr tablet, Take 2 tablets (1,000 mg total) by mouth 2 (two) times daily before a meal., Disp: 360 tablet, Rfl: 3   naproxen (NAPROSYN) 500 MG tablet, Take 1 tablet (500 mg total) by mouth 2 (two) times daily with a meal. One tablet, by mouth, with food, twice a day as needed for cramping, Disp: 90 tablet, Rfl: 2   sitaGLIPtin (JANUVIA) 100 MG tablet, Take 1 tablet by mouth daily., Disp: , Rfl:    verapamil (CALAN-SR) 240 MG CR tablet, TAKE 1/2 TABLET (120 MG) BY MOUTH EVERY DAY, Disp: 45 tablet, Rfl: 0   omeprazole (PRILOSEC) 20 MG capsule, Take 1 capsule (20 mg total) by mouth daily. (Patient not taking: Reported on 05/01/2016), Disp: 14 capsule, Rfl: 0   ranitidine (ZANTAC) 150 MG tablet, Take 150 mg by mouth once. , Disp: , Rfl:   Social History   Social History   Marital status: Single    Spouse name: N/A   Number of children: N/A   Years of education: N/A   Social  History Main Topics   Smoking status: Never Smoker   Smokeless tobacco: Never Used   Alcohol use No   Drug use: No   Sexual activity: No   Other Topics Concern   Not on file   Social History Narrative   No narrative on file    Family History  Problem Relation Age of Onset   Kidney failure Father    Pancreatic cancer Maternal Grandmother     Review of Systems A 12 point review of systems was negative except for pertinent items noted in the HPI.   Objective:      BP Readings from Last 3 Encounters:  03/08/16 (!) 116/58  10/27/15 100/65  09/22/15 (!) 119/56    Wt Readings  from Last 3 Encounters:  03/08/16 249 lb (112.9 kg)  10/27/15 252 lb (114.3 kg)  09/22/15 233 lb (105.7 kg)   There were no vitals taken for this visit.  General appearance:  alert and appears stated age, in no distress      Eyes:  conjunctivae/corneas clear. EOM's intact. Sclera anicteric. Negative lid lag or proptosis     Neck: no adenopathy, supple, symmetrical, trachea midline  Thyroid:  Mobile, normal size, no palpable nodule                          Lab Review No components found for: A1C Glucose (mg/dL)  Date Value  16/10/960412/14/2017 284 (H)  02/09/2016 139 (H)  10/13/2015 171 (H)  06/06/2014 307 (H)   Glucose, Bld  Date Value  01/23/2014 86 mg/dL  54/09/811911/13/2008 147164 (H)   CO2 (mmol/L)  Date Value  04/26/2016 22  02/09/2016 26  10/13/2015 24   Co2 (mmol/L)  Date Value  06/06/2014 29   BUN (mg/dL)  Date Value  82/95/621312/14/2017 7  02/09/2016 11  10/13/2015 6  06/06/2014 12   Creatinine (mg/dL)  Date Value  08/65/784601/24/2016 1.32 (H)   Creatinine, Ser (mg/dL)  Date Value  96/29/528412/14/2017 0.86  02/09/2016 0.73  10/13/2015 0.72   No components found for: LDL,  LDLCALC,  LDLDIRECT Lab Results  Component Value Date   NA 138 04/26/2016   K 4.0 04/26/2016   CL 97 04/26/2016   CO2 22 04/26/2016   BUN 7 04/26/2016   CREATININE 0.86 04/26/2016   GFRAA 96 04/26/2016   GFRNONAA 84 04/26/2016   GLU 306 06/14/2015   CALCIUM 9.5 04/26/2016   ALBUMIN 4.2 04/26/2016     DIABETES MELLITUS RESULTS: Lab Results  Component Value Date   HGBA1C 9.8 (H) 04/26/2016   HGBA1C 9.2 (H) 02/09/2016   HGBA1C 7.9 (H) 10/13/2015   Lab Results  Component Value Date   LDLCALC 109 (H) 02/09/2016   CREATININE 0.86 04/26/2016   Lab Results  Component Value Date   CHOL 169 02/09/2016   Lab Results  Component Value Date   LDLCALC 109 (H) 02/09/2016   No components found for: CHOLLLDLDIRECT No components found for: MICROALB/CR Lab Results  Component Value Date   GFRAA 96  04/26/2016   GFRNONAA 84 04/26/2016

## 2016-05-10 ENCOUNTER — Ambulatory Visit: Payer: Self-pay | Admitting: Adult Health Nurse Practitioner

## 2016-05-10 NOTE — Telephone Encounter (Signed)
Placed signed application/script in [facility] folder for pickup. 

## 2016-05-10 NOTE — Telephone Encounter (Signed)
[facility] a signature from Nucor CorporationShannon McGowan.

## 2016-05-11 NOTE — Progress Notes (Signed)
No show

## 2016-06-05 ENCOUNTER — Other Ambulatory Visit: Payer: Self-pay

## 2016-06-05 NOTE — Telephone Encounter (Signed)
Received PAP application from [facility] for Victoza placed for provider to sign. 

## 2016-06-07 ENCOUNTER — Other Ambulatory Visit: Payer: Self-pay

## 2016-06-07 NOTE — Telephone Encounter (Signed)
[facility] placed for provider to sign.

## 2016-06-08 NOTE — Telephone Encounter (Signed)
Placed signed application/script in [facility] folder for pickup. 

## 2016-06-18 ENCOUNTER — Telehealth: Payer: Self-pay | Admitting: Pharmacist

## 2016-06-18 NOTE — Telephone Encounter (Signed)
[employer]/Januvia PAP submitted to manufacturer today.

## 2016-07-26 ENCOUNTER — Telehealth: Payer: Self-pay | Admitting: Pharmacy Technician

## 2016-07-26 NOTE — Telephone Encounter (Signed)
Patient provided 2018 financial information.  [facility] as long as total household income does not exceed 250% FPL or pt obtains prescription coverage.  Made patient aware the [employer] is requiring a Medicaid Denial Letter before sending the Victoza.  Hannah DacostaBetty J. Brayan Zamora Care Manager [facility]

## 2016-07-31 ENCOUNTER — Ambulatory Visit: Payer: Self-pay | Admitting: Endocrinology

## 2016-07-31 DIAGNOSIS — E1165 Type 2 diabetes mellitus with hyperglycemia: Secondary | ICD-10-CM

## 2016-07-31 MED ORDER — METFORMIN HCL ER 500 MG PO TB24: 1000.0000 mg | ORAL_TABLET | Freq: Two times a day (BID) | ORAL | 3 refills | Status: DC

## 2016-07-31 NOTE — Progress Notes (Signed)
[facility]   Assessment and Plan Rikki Arabella Merlesicole Okeefe returns for follow-up of poorly controlled type 2 diabetes.  Patient has now gotten paperwork to receive victoza from [facility]. When victoza is initiated, discontinue the [place]uv[place]. Until victoza arrives, take 100mg  of Januvia (currently prescribed 50 mg QD increased to BID). Counseled on the side effects of victoza, particularly nausea or loss of appetite. Counseled that victoza will not cause hypoglycemia.    Subjective  Kaitland reports that DM management is the same. Reports pain bilaterally in eyes but particularly in L eye. "Glass shards" behind eye and sensation behind L eye. States that last time this sensation occurred and went to doctor they told her she had cholesterol deposits.   Reports blood sugars in the 300s.   Diet consists of increased fruits and vegetables but blood sugars have not come down.         Other problems include:  Patient Active Problem List   Diagnosis Date Noted  . Obesity 05/01/2016  . Depression 05/01/2016  . Metrorrhagia 11/10/2015  . Diabetes (HCC) 03/10/2015  . Anemia 03/10/2015  . Edema 03/10/2015    Her current medications include: Current Outpatient Prescriptions  Medication Sig Dispense Refill  . glipiZIDE (GLUCOTROL) 10 MG tablet TAKE ONE TABLET BY MOUTH 2 TIMES A DAY 180 tablet 0  . ibuprofen (ADVIL,MOTRIN) 800 MG tablet Take 1 tablet (800 mg total) by mouth 3 (three) times daily. (Patient taking differently: Take 800 mg by mouth every 8 (eight) hours as needed. ) 21 tablet 0  . Iron TABS Take 1 tablet by mouth daily. Reported on 08/02/2015    . metFORMIN (GLUCOPHAGE-XR) 500 MG 24 hr tablet Take 2 tablets (1,000 mg total) by mouth 2 (two) times daily before a meal. 360 tablet 3  . Multiple Vitamins-Minerals (VITAMIN C EFFERVESCENT BLEND) PACK Take 1 packet by mouth daily.    . naproxen (NAPROSYN) 500 MG tablet Take 1 tablet (500 mg total) by mouth 2 (two)  times daily with a meal. One tablet, by mouth, with food, twice a day as needed for cramping 90 tablet 2  . sitaGLIPtin (JANUVIA) 100 MG tablet Take 1 tablet by mouth daily.    Marland Kitchen. liraglutide 18 MG/3ML SOPN Titration. Start at 1.2 mg and increase to 1.5 and finally to 1.8 if can tolerate. (Patient not taking: Reported on 07/31/2016) 0.17 mL 0  . omeprazole (PRILOSEC) 20 MG capsule Take 1 capsule (20 mg total) by mouth daily. (Patient not taking: Reported on 05/01/2016) 14 capsule 0  . ranitidine (ZANTAC) 150 MG tablet Take 150 mg by mouth once.     . verapamil (CALAN-SR) 240 MG CR tablet TAKE 1/2 TABLET (120 MG) BY MOUTH EVERY DAY (Patient not taking: Reported on 07/31/2016) 45 tablet 0   No current facility-administered medications for this visit.     Interim history: NA   Exam:  BP 125/71 (BP Location: Left Arm)   Wt 262 lb 3.2 oz (118.9 kg)   BMI 39.87 kg/m  Constitutional:, Alert, oriented, in NAD Cardiovascular: Regular rhythm, no murmurs, gallops, rubs. Respiratory: Lungs clear, no wheezes or rales Skin: no rashes or lesions. Feet without lesions Neurologic: light touch sensation normal in feet in 5/5 spots bilaterally by monofilament Psychiatric: Oriented, normal mood and affect Heme/lymph/immunologic: no lymphadenopathy   Recent labs:  Results for orders placed or performed in visit on 05/01/16  POCT Glucose (CBG)  Result Value Ref Range   POC Glucose 219 (A) 70 - 99  mg/dl    Assessment and Plan:    There are no diagnoses linked to this encounter.  RTC 3 months.

## 2016-08-01 ENCOUNTER — Telehealth: Payer: Self-pay | Admitting: Pharmacist

## 2016-08-01 NOTE — Telephone Encounter (Signed)
Patient came by office today with a Medicaid denial, I have faxed to [employer] for them to complete processing of Victoza & Novofine 32G tips.

## 2016-08-02 LAB — POCT GLYCOSYLATED HEMOGLOBIN (HGB A1C): Hemoglobin A1C: 9.9

## 2016-08-02 LAB — GLUCOSE, POCT (MANUAL RESULT ENTRY): POC Glucose: 328 mg/dl — AB (ref 70–99)

## 2016-08-02 NOTE — Addendum Note (Signed)
Addended by: Smiley HousemanARTER, LORRIE D on: 08/02/2016 01:37 PM   Modules accepted: Orders

## 2016-08-10 ENCOUNTER — Other Ambulatory Visit: Payer: Self-pay | Admitting: Internal Medicine

## 2016-08-23 ENCOUNTER — Other Ambulatory Visit: Payer: Self-pay | Admitting: Urology

## 2016-08-23 ENCOUNTER — Ambulatory Visit: Payer: Self-pay | Admitting: Ophthalmology

## 2016-08-28 ENCOUNTER — Telehealth: Payer: Self-pay | Admitting: Urology

## 2016-08-28 NOTE — Telephone Encounter (Signed)
Patient is requesting a refill on her iron, but she has not had any iron studies since 01/2016.  I will okay one refill, but she will need blood work.  She will need a CBC, ferritin level and a transferrin level.

## 2016-08-29 ENCOUNTER — Other Ambulatory Visit: Payer: Self-pay

## 2016-08-29 DIAGNOSIS — D649 Anemia, unspecified: Secondary | ICD-10-CM

## 2016-08-30 LAB — CBC WITH DIFFERENTIAL
Basophils Absolute: 0 10*3/uL (ref 0.0–0.2)
Basos: 0 %
EOS (ABSOLUTE): 0.2 10*3/uL (ref 0.0–0.4)
Eos: 2 %
Hematocrit: 34.1 % (ref 34.0–46.6)
Hemoglobin: 10.2 g/dL — ABNORMAL LOW (ref 11.1–15.9)
Immature Grans (Abs): 0 10*3/uL (ref 0.0–0.1)
Immature Granulocytes: 0 %
Lymphocytes Absolute: 1.9 10*3/uL (ref 0.7–3.1)
Lymphs: 22 %
MCH: 22.6 pg — ABNORMAL LOW (ref 26.6–33.0)
MCHC: 29.9 g/dL — ABNORMAL LOW (ref 31.5–35.7)
MCV: 76 fL — ABNORMAL LOW (ref 79–97)
Monocytes Absolute: 0.4 10*3/uL (ref 0.1–0.9)
Monocytes: 5 %
Neutrophils Absolute: 6.3 10*3/uL (ref 1.4–7.0)
Neutrophils: 71 %
RBC: 4.51 x10E6/uL (ref 3.77–5.28)
RDW: 14.5 % (ref 12.3–15.4)
WBC: 8.7 10*3/uL (ref 3.4–10.8)

## 2016-08-30 LAB — FERRITIN: Ferritin: 143 ng/mL (ref 15–150)

## 2016-08-30 LAB — TRANSFERRIN: Transferrin: 272 mg/dL (ref 200–370)

## 2016-09-25 ENCOUNTER — Other Ambulatory Visit: Payer: Self-pay

## 2016-09-25 DIAGNOSIS — E119 Type 2 diabetes mellitus without complications: Secondary | ICD-10-CM

## 2016-09-25 MED ORDER — GLIPIZIDE 10 MG PO TABS: 10.0000 mg | ORAL_TABLET | Freq: Two times a day (BID) | ORAL | 0 refills | Status: DC

## 2016-10-11 ENCOUNTER — Encounter: Payer: Self-pay | Admitting: Pharmacist

## 2016-10-18 ENCOUNTER — Telehealth: Payer: Self-pay

## 2016-10-18 NOTE — Telephone Encounter (Signed)
Received PAP application from [facility] for Victoza placed for provider to sign. 

## 2016-10-30 ENCOUNTER — Ambulatory Visit: Payer: Self-pay | Admitting: Endocrinology

## 2016-10-30 VITALS — BP 126/79 | HR 85 | Wt 264.5 lb

## 2016-10-30 DIAGNOSIS — E119 Type 2 diabetes mellitus without complications: Secondary | ICD-10-CM

## 2016-10-30 LAB — GLUCOSE, POCT (MANUAL RESULT ENTRY): POC Glucose: 230 mg/dl — AB (ref 70–99)

## 2016-10-30 NOTE — Progress Notes (Signed)
[facility]  Scribing for Sallee ProvencalKatelyn Rittenhouse, [place]  10/30/2016  Name: Hannah KirksRadonna Nicole Zamora  MRN: [id number]  Date of Birth: 1975-06-25  Chief Complaint: No chief complaint on file.   HPI: HPI  Past Medical History:  Past Medical History:  Diagnosis Date  . Diabetes mellitus without complication (HCC)   . GERD (gastroesophageal reflux disease)   . Headache   . Hypertension     Past Surgical History:  Past Surgical History:  Procedure Laterality Date  . CHOLECYSTECTOMY  1998    Past Family History:  Family History  Problem Relation Age of Onset  . Kidney failure Father   . Pancreatic cancer Maternal Grandmother     Diabetes Management:  Lab Results  Component Value Date   HGBA1C 9.9 08/02/2016   Highest blood sugar seen in last 1-2 months? 130  Reported blood sugar average: 95-120  Trouble with managing blood sugar : She experiences random hypoglycemic episodes.   New Complaints:  Pt is concerned that her blood sugar is lower but her A1C is still higher than she expected.  She is also concerned that she hasn't been losing weight like she expected because she has been eating less. She checks her blood sugar BID. Her last meal was pickles at lunch.  Pt reports that she has been sweating profusely in her face and feels burning on both sides of her lower cheeks and underneath her chin. She reports her face also turns red. She is using Aloe and triple antibiotic which seems to temporarily help until it dries. She reports lotion irritates it. She reports the sweating has been happening for a year and the irritation for a few weeks.  Pt reports occasional symptoms of hypoglycemia when she began Victoza. Her most recent episode was Sunday.       Clinical Assessment & Plan  A1C today 8.4

## 2016-10-30 NOTE — Progress Notes (Signed)
Patient seen with medical student and I agree with her note. Her A1C has improved significantly, but is still above goal. Will have her continue current medications. Encouraged to get more physical activity and to check at other times of day, since reported home glucose readings are significantly better than A1C would suggest, and random glucose high here.

## 2016-10-31 NOTE — Telephone Encounter (Signed)
Placed signed application/script in [facility] folder for pickup. 

## 2016-11-06 ENCOUNTER — Telehealth: Payer: Self-pay | Admitting: Pharmacist

## 2016-11-06 NOTE — Telephone Encounter (Signed)
11/06/16 Faxed refill request to [employer] for Victoza Inject 1.8mg  once daily, Novofine 32G.Forde RadonAJ

## 2016-12-15 ENCOUNTER — Emergency Department
Admission: EM | Admit: 2016-12-15 | Discharge: 2016-12-15 | Disposition: A | Discharge status: Home / Self Care | Payer: Self-pay | Attending: Emergency Medicine | Admitting: Emergency Medicine

## 2016-12-15 ENCOUNTER — Encounter: Payer: Self-pay | Admitting: Emergency Medicine

## 2016-12-15 ENCOUNTER — Emergency Department: Payer: Self-pay

## 2016-12-15 DIAGNOSIS — Z79899 Other long term (current) drug therapy: Secondary | ICD-10-CM | POA: Insufficient documentation

## 2016-12-15 DIAGNOSIS — Z7984 Long term (current) use of oral hypoglycemic drugs: Secondary | ICD-10-CM | POA: Insufficient documentation

## 2016-12-15 DIAGNOSIS — H00031 Abscess of right upper eyelid: Secondary | ICD-10-CM | POA: Insufficient documentation

## 2016-12-15 DIAGNOSIS — E119 Type 2 diabetes mellitus without complications: Secondary | ICD-10-CM | POA: Insufficient documentation

## 2016-12-15 DIAGNOSIS — I10 Essential (primary) hypertension: Secondary | ICD-10-CM | POA: Insufficient documentation

## 2016-12-15 DIAGNOSIS — L03213 Periorbital cellulitis: Secondary | ICD-10-CM

## 2016-12-15 LAB — COMPREHENSIVE METABOLIC PANEL
ALT: 16 U/L (ref 14–54)
AST: 21 U/L (ref 15–41)
Albumin: 3.9 g/dL (ref 3.5–5.0)
Alkaline Phosphatase: 88 U/L (ref 38–126)
Anion gap: 8 (ref 5–15)
BUN: 7 mg/dL (ref 6–20)
CO2: 27 mmol/L (ref 22–32)
Calcium: 9.1 mg/dL (ref 8.9–10.3)
Chloride: 101 mmol/L (ref 101–111)
Creatinine, Ser: 1.01 mg/dL — ABNORMAL HIGH (ref 0.44–1.00)
GFR calc Af Amer: >60 mL/min (ref >60)
GFR calc non Af Amer: >60 mL/min (ref >60)
Glucose, Bld: 250 mg/dL — ABNORMAL HIGH (ref 65–99)
Potassium: 4.2 mmol/L (ref 3.5–5.1)
Sodium: 136 mmol/L (ref 135–145)
Total Bilirubin: 0.4 mg/dL (ref 0.3–1.2)
Total Protein: 7.4 g/dL (ref 6.5–8.1)

## 2016-12-15 LAB — CBC WITH DIFFERENTIAL/PLATELET
Basophils Absolute: 0 10*3/uL (ref 0–0.1)
Basophils Relative: 0 %
Eosinophils Absolute: 0.1 10*3/uL (ref 0–0.7)
Eosinophils Relative: 1 %
HCT: 29.8 % — ABNORMAL LOW (ref 35.0–47.0)
Hemoglobin: 9.9 g/dL — ABNORMAL LOW (ref 12.0–16.0)
Lymphocytes Relative: 24 %
Lymphs Abs: 2 10*3/uL (ref 1.0–3.6)
MCH: 23.8 pg — ABNORMAL LOW (ref 26.0–34.0)
MCHC: 33.1 g/dL (ref 32.0–36.0)
MCV: 71.9 fL — ABNORMAL LOW (ref 80.0–100.0)
Monocytes Absolute: 0.5 10*3/uL (ref 0.2–0.9)
Monocytes Relative: 6 %
Neutro Abs: 5.7 10*3/uL (ref 1.4–6.5)
Neutrophils Relative %: 69 %
Platelets: 394 10*3/uL (ref 150–440)
RBC: 4.15 MIL/uL (ref 3.80–5.20)
RDW: 15.9 % — ABNORMAL HIGH (ref 11.5–14.5)
WBC: 8.4 10*3/uL (ref 3.6–11.0)

## 2016-12-15 LAB — POCT PREGNANCY, URINE: Preg Test, Ur: NEGATIVE

## 2016-12-15 MED ORDER — KETOROLAC TROMETHAMINE 0.5 % OP SOLN: 1.0000 [drp] | Freq: Four times a day (QID) | OPHTHALMIC | 0 refills | Status: DC | PRN

## 2016-12-15 MED ORDER — TETRACAINE HCL 0.5 % OP SOLN
2.0000 [drp] | Freq: Once | OPHTHALMIC | Status: DC
  Filled 2016-12-15: qty 4

## 2016-12-15 MED ORDER — IOPAMIDOL (ISOVUE-300) INJECTION 61%
75.0000 mL | Freq: Once | INTRAVENOUS | Status: AC | PRN
  Administered 2016-12-15: 75 mL via INTRAVENOUS
  Filled 2016-12-15: qty 75

## 2016-12-15 MED ORDER — CLINDAMYCIN HCL 300 MG PO CAPS: 300.0000 mg | ORAL_CAPSULE | Freq: Three times a day (TID) | ORAL | 0 refills | Status: AC

## 2016-12-15 MED ORDER — ERYTHROMYCIN 5 MG/GM OP OINT: TOPICAL_OINTMENT | Freq: Three times a day (TID) | OPHTHALMIC | 0 refills | Status: DC

## 2016-12-15 MED ORDER — IBUPROFEN 600 MG PO TABS
600.0000 mg | ORAL_TABLET | Freq: Once | ORAL | Status: AC
  Administered 2016-12-15: 600 mg via ORAL
  Filled 2016-12-15: qty 1

## 2016-12-15 MED ORDER — FLUORESCEIN SODIUM 0.6 MG OP STRP
1.0000 | ORAL_STRIP | Freq: Once | OPHTHALMIC | Status: AC
  Administered 2016-12-15: 1 via OPHTHALMIC
  Filled 2016-12-15: qty 1

## 2016-12-15 MED ORDER — TETRACAINE HCL 0.5 % OP SOLN
2.0000 [drp] | Freq: Once | OPHTHALMIC | Status: AC
  Administered 2016-12-15: 2 [drp] via OPHTHALMIC
  Filled 2016-12-15: qty 4

## 2016-12-15 NOTE — ED Triage Notes (Signed)
Pt c/o pain and swelling in the right eye. Pt states that symptoms started yesterday. Pt denies visual changes.

## 2016-12-15 NOTE — ED Provider Notes (Signed)
[facility] Emergency Department Provider Note   ____________________________________________   I have reviewed the triage vital signs and the nursing notes.   HISTORY  Chief Complaint Facial Swelling    HPI [name] is a 41 y.o. female presents left upper eyelid swelling, eye pain since yesterday after leaving the [employer]ibachi restaurant yesterday. Patient has unsure if she got something in her eye however she noted onset of pain, grittiness along the lateral aspect of the right eye followed by swelling of the upper eyelid. Patient denies any drainage, changes in visual acuity or photosensitivity. Patient does not wear contact lenses. Patient denies fever, chills, headache, vision changes, chest pain, chest tightness, shortness of breath, abdominal pain, nausea and vomiting.  Past Medical History:  Diagnosis Date  . Diabetes mellitus without complication (HCC)   . GERD (gastroesophageal reflux disease)   . Headache   . Hypertension     Patient Active Problem List   Diagnosis Date Noted  . Obesity 05/01/2016  . Depression 05/01/2016  . Metrorrhagia 11/10/2015  . Diabetes (HCC) 03/10/2015  . Anemia 03/10/2015  . Edema 03/10/2015    Past Surgical History:  Procedure Laterality Date  . CHOLECYSTECTOMY  1998    Prior to Admission medications   Medication Sig Start Date End Date Taking? Authorizing Provider  clindamycin (CLEOCIN) 300 MG capsule Take 1 capsule (300 mg total) by mouth 3 (three) times daily. 12/15/16 12/22/16  Dawon Troop M, PA-C  ferrous gluconate (FERGON) 324 MG tablet TAKE ONE TABLET BY MOUTH 3 TIMES A DAY 08/28/16   McGowan, Carollee HerterShannon A, PA-C  glipiZIDE (GLUCOTROL) 10 MG tablet Take 1 tablet (10 mg total) by mouth 2 (two) times daily before a meal. 09/25/16   McGowan, Carollee HerterShannon A, PA-C  ibuprofen (ADVIL,MOTRIN) 800 MG tablet Take 1 tablet (800 mg total) by mouth 3 (three) times daily. Patient taking differently: Take 800 mg by  mouth every 8 (eight) hours as needed.  01/24/14   Ladona MowMintz, Joe, PA-C  ketorolac (ACULAR) 0.5 % ophthalmic solution Place 1 drop into the right eye 4 (four) times daily as needed. 12/15/16   Solei Wubben M, PA-C  liraglutide 18 MG/3ML SOPN Titration. Start at 1.2 mg and increase to 1.5 and finally to 1.8 if can tolerate. 05/01/16   Nehemiah SettleLargay, Joseph F, PA  metFORMIN (GLUCOPHAGE-XR) 500 MG 24 hr tablet Take 2 tablets (1,000 mg total) by mouth 2 (two) times daily before a meal. 07/31/16   Grace BushyKirkman, Marian S, MD  Multiple Vitamins-Minerals (VITAMIN C EFFERVESCENT BLEND) PACK Take 1 packet by mouth daily.    [provider]  naproxen (NAPROSYN) 500 MG tablet Take 1 tablet (500 mg total) by mouth 2 (two) times daily with a meal. One tablet, by mouth, with food, twice a day as needed for cramping 10/27/15   Zachery Dauerdem, Donna S, FNP  omeprazole (PRILOSEC) 20 MG capsule Take 1 capsule (20 mg total) by mouth daily. Patient not taking: Reported on 05/01/2016 03/08/16   Doles-Johnson, Teah, NP  ranitidine (ZANTAC) 150 MG tablet Take 150 mg by mouth once.     [provider]  sitaGLIPtin (JANUVIA) 100 MG tablet Take 1 tablet by mouth daily.    [provider]  verapamil (CALAN-SR) 240 MG CR tablet TAKE 1/2 TABLET (120 MG) BY MOUTH EVERY DAY Patient not taking: Reported on 10/30/2016 08/10/16   Virl Axehaplin, Don C, MD    Allergies Penicillins  Family History  Problem Relation Age of Onset  . Kidney failure  Father   . Pancreatic cancer Maternal Grandmother     Social History Social History  Substance Use Topics  . Smoking status: Never Smoker  . Smokeless tobacco: Never Used  . Alcohol use No    Review of Systems Constitutional: Negative for fever/chills Eyes: No visual changes. Left upper eyelid swelling, pain with increased pain during extraocular movement. ENT:  Negative for sore throat and for difficulty swallowing Cardiovascular: Denies chest pain. Respiratory: Denies cough. Denies  shortness of breath. Gastrointestinal: No abdominal pain.  No nausea, vomiting, diarrhea. Genitourinary: Negative for dysuria. Musculoskeletal: Negative for back pain. Skin: Negative for rash. Left side facial swelling and periorbital swelling. Neurological: Negative for headaches.  Negative focal weakness or numbness. Negative for loss of consciousness. Able to ambulate. ____________________________________________   PHYSICAL EXAM:  VITAL SIGNS: ED Triage Vitals  Enc Vitals Group     BP 12/15/16 1509 138/75     Pulse Rate 12/15/16 1509 91     Resp 12/15/16 1509 16     Temp 12/15/16 1509 98.4 F (36.9 C)     Temp Source 12/15/16 1509 Oral     SpO2 12/15/16 1509 97 %     Weight --      Height --      Head Circumference --      Peak Flow --      Pain Score 12/15/16 1508 7     Pain Loc --      Pain Edu? --      Excl. in GC? --     Constitutional: Alert and oriented. Well appearing and in no acute distress.  Eyes: Conjunctivae are normal. PERRL. EOMI with increased pain during inferior oblique motions. Intact visual acuity. Upper eyelid swelling extending into the eyebrow with mild erythema. Tono-Pen reading: Right eye: 31.5 mmHg; Left eye: 25 mmHg Head: Normocephalic and atraumatic. ENT:      Ears: Canals clear. TMs intact bilaterally.      Nose: No congestion/rhinnorhea.      Mouth/Throat: Mucous membranes are moist. Neck:Supple. No thyromegaly. No stridor.  Cardiovascular: Normal rate, regular rhythm. Normal S1 and S2.  Good peripheral circulation. Respiratory: Normal respiratory effort without tachypnea or retractions. Lungs CTAB. Good air entry to the bases with no decreased or absent breath sounds. Hematological/Lymphatic/Immunological: No cervical lymphadenopathy. Cardiovascular: Normal rate, regular rhythm. Normal distal pulses. Respiratory: Normal respiratory effort. No wheezes/rales/rhonchi. Lungs CTAB with no W/R/R. Gastrointestinal: Bowel sounds 4 quadrants. Soft  and nontender to palpation. No guarding or rigidity. No palpable masses. No distention. No CVA tenderness. Musculoskeletal: Nontender with normal range of motion in all extremities. Neurologic: Normal speech and language. No gross focal neurologic deficits are appreciated. No gait instability. Cranial nerves: II-X intact. No sensory loss or abnormal reflexes.  Skin:  Skin is warm, dry and intact. No rash noted. Psychiatric: Mood and affect are normal. Speech and behavior are normal. Patient exhibits appropriate insight and judgement.  ____________________________________________   LABS (all labs ordered are listed, but only abnormal results are displayed)  Labs Reviewed  COMPREHENSIVE METABOLIC PANEL - Abnormal; Notable for the following:       Result Value   Glucose, Bld 250 (*)    Creatinine, Ser 1.01 (*)    All other components within normal limits  CBC WITH DIFFERENTIAL/PLATELET - Abnormal; Notable for the following:    Hemoglobin 9.9 (*)    HCT 29.8 (*)    MCV 71.9 (*)    MCH 23.8 (*)    RDW 15.9 (*)  All other components within normal limits  POC URINE PREG, ED  POCT PREGNANCY, URINE   ____________________________________________  EKG None ____________________________________________  RADIOLOGY CT Maxillofacial w/ contrast IMPRESSION: 1. Mild right orbit preseptal soft tissue thickening such as due to preseptal cellulitis. No abscess or complicating features. 2. Carious right mandible and maxilla molars and wisdom teeth. 3. Otherwise negative face CT. ____________________________________________   PROCEDURES  Procedure(s) performed: Fluorescein stain eye exam performed following physical exam:  Performed by: Clois Comber Authorized by: Clois Comber Consent: Verbal consent obtained. Risks and benefits: risks, benefits and alternatives were discussed Consent given by: patient Irrigated right eye with Eye Stream eye wash.  (1) drop Tetracaine instilled  followed by instillation of fluorescein dye with fluorescein strip.  Examination with Woods slit lamp performed: No defects noted.  Following the exam:  Right eye irrigated with Eye Stream and (1) drop of Tetracaine instilled.   Patient tolerance: Patient tolerated the procedure well with no immediate complications.     Critical Care performed: no ____________________________________________   INITIAL IMPRESSION / ASSESSMENT AND PLAN / ED COURSE  Pertinent labs & imaging results that were available during my care of the patient were reviewed by me and considered in my medical decision making (see chart for details).   Patient presents to emergency department withleft upper eyelid swelling, eye pain. History, physical exam findings, labs and imaging are reassuring symptoms are consistent with right orbit preseptal cellulitis. Patient will be prescribed clindamycin for antibiotic coverage and Acular eye drops for eye pain and inflammation. Patient informed of clinical course, understand medical decision-making process, and agree with plan. Patient advised to follow up with PCP as needed or return to the emergency department if symptoms return or worsen.    ____________________________________________   FINAL CLINICAL IMPRESSION(S) / ED DIAGNOSES  Final diagnoses:  Preseptal cellulitis of right upper eyelid       NEW MEDICATIONS STARTED DURING THIS VISIT:  Discharge Medication List as of 12/15/2016  6:46 PM    START taking these medications   Details  clindamycin (CLEOCIN) 300 MG capsule Take 1 capsule (300 mg total) by mouth 3 (three) times daily., Starting Sat 12/15/2016, Until Sat 12/22/2016, Print    ketorolac (ACULAR) 0.5 % ophthalmic solution Place 1 drop into the right eye 4 (four) times daily as needed., Starting Sat 12/15/2016, Print         Note:  This document was prepared using Dragon voice recognition software and may include unintentional dictation errors.      Seraiah Nowack, Jordan Likes, PA-C 12/15/16 Lequita Halt, MD 12/15/16 [phone]

## 2016-12-15 NOTE — ED Notes (Signed)
Pt poct urine pregnancy was negative

## 2016-12-15 NOTE — Discharge Instructions (Signed)
Take medication as prescribed. Return to emergency department if symptoms worsen and follow-up with PCP as needed.   °

## 2017-01-29 ENCOUNTER — Ambulatory Visit: Payer: Self-pay

## 2017-02-04 ENCOUNTER — Ambulatory Visit: Payer: Self-pay | Admitting: Pharmacist

## 2017-02-04 ENCOUNTER — Encounter: Payer: Self-pay | Admitting: Pharmacist

## 2017-02-04 VITALS — BP 132/72 | Wt 263.0 lb

## 2017-02-04 DIAGNOSIS — Z79899 Other long term (current) drug therapy: Secondary | ICD-10-CM

## 2017-02-04 NOTE — Progress Notes (Signed)
Medication Management Clinic Visit Note  Patient: [name] MRN: [id number] Date of Birth: 1976-02-20 PCP: System, Pcp Not In   [name] 41 y.o. female presents for her annual visit at medication management clinic with the pharmacy resident today.  BP 132/72   Wt 263 lb (119.3 kg)   BMI 39.99 kg/m   Patient Information   Past Medical History:  Diagnosis Date  . Diabetes mellitus without complication (HCC)   . GERD (gastroesophageal reflux disease)   . Headache   . Hypertension       Past Surgical History:  Procedure Laterality Date  . CHOLECYSTECTOMY  1998     Family History  Problem Relation Age of Onset  . Kidney failure Father   . Pancreatic cancer Maternal Grandmother      Family Support: Good  Lifestyle Diet: Breakfast: eggs, sausage, canned spinach  Lunch:tuna sandwich (with mayo) on wheat bread  Dinner: TV dinners (mashed potatoes, meat)  Drinks: Juice, water     Current Exercise Habits: The patient does not participate in regular exercise at present  Exercise limited by: Other - see comments    History  Alcohol Use No      History  Smoking Status  . Never Smoker  Smokeless Tobacco  . Never Used      Health Maintenance  Topic Date Due  . PNEUMOCOCCAL POLYSACCHARIDE VACCINE (1) 08/22/1975  . FOOT EXAM  08/22/1983  . OPHTHALMOLOGY EXAM  08/22/1983  . HIV Screening  08/21/1988  . TETANUS/TDAP  08/21/1992  . PAP SMEAR  08/22/1994  . INFLUENZA VACCINE  12/12/2016  . HEMOGLOBIN A1C  02/02/2017  . URINE MICROALBUMIN  04/26/2017   Prior to Admission medications   Medication Sig Start Date End Date Taking? Authorizing Provider  glipiZIDE (GLUCOTROL) 10 MG tablet Take 1 tablet (10 mg total) by mouth 2 (two) times daily before a meal. 09/25/16  Yes McGowan, Carollee Herter A, PA-C  ibuprofen (ADVIL,MOTRIN) 800 MG tablet Take 1 tablet (800 mg total) by mouth 3 (three) times daily. Patient taking differently: Take 800 mg by  mouth every 8 (eight) hours as needed.  01/24/14  Yes Ladona Mow, PA-C  liraglutide 18 MG/3ML SOPN Titration. Start at 1.2 mg and increase to 1.5 and finally to 1.8 if can tolerate. 05/01/16  Yes Nehemiah Settle, PA  metFORMIN (GLUCOPHAGE-XR) 500 MG 24 hr tablet Take 2 tablets (1,000 mg total) by mouth 2 (two) times daily before a meal. 07/31/16  Yes Grace Bushy, MD    Assessment and Plan:  1. Type 2 diabetes - Patient currently on Victoza, glipizide and metformin; denies any side effects. - Patient not currently on a aspirin, ACEi/ARB or statin  - Most recent A1c: 9.9% (07/2016)  - Lipid panel (01/2016) reviewed: LDL=109, TC=169, TG=125, HDL=35.  - Checks blood sugars every day  in the morning and evening; Fasting BG usually runs around 120 mg/dL but has recently been around 170 mg/dL. Post-evening meal BG usually runs around 215 mg/dL but has recently been around 260 mg/dL - Patient has been taking Victoza 1.2mg  daily for about a month now. Recommended increasing to the 1.8 mg due to no adverse effects and increased blood sugars - Patient states BG is currently higher due to her diet; Has been drinking more juice. Counseled patient on diluting with water (half and half) and/or not drinking as much juice.   2. Hypertension  - Controlled; BP today in clinic 132/72 mmHg - Not currently on any medications  for BP  Patient brought in medication bottles, all medication reviewed with patient. Gave pill box. To return back to medication management clinic in 3 months for a follow up or sooner if any questions/conerns come up.   Cleopatra Cedar, PharmD  Pharmacy Resident  02/04/17

## 2017-03-15 ENCOUNTER — Telehealth: Payer: Self-pay | Admitting: Pharmacist

## 2017-03-15 NOTE — Telephone Encounter (Signed)
03/15/17 Faxed refill request to [facility] for Victoza Inject 1.8mg daily & Novofine 32G tips.AJ ° °

## 2017-06-07 ENCOUNTER — Telehealth: Payer: Self-pay | Admitting: Pharmacist

## 2017-06-07 NOTE — Telephone Encounter (Signed)
06/07/17 Printed [facility] refill request for [employer] Inject 1.8mg  once daily # 4, also Novofine 32G tips. [facility]. Forde RadonAJ

## 2017-06-13 ENCOUNTER — Ambulatory Visit: Payer: Self-pay | Admitting: Family Medicine

## 2017-06-13 VITALS — BP 110/71 | HR 75 | Temp 98.4°F | Wt 263.8 lb

## 2017-06-13 DIAGNOSIS — H5713 Ocular pain, bilateral: Secondary | ICD-10-CM

## 2017-06-13 DIAGNOSIS — Z09 Encounter for follow-up examination after completed treatment for conditions other than malignant neoplasm: Secondary | ICD-10-CM

## 2017-06-13 DIAGNOSIS — E1165 Type 2 diabetes mellitus with hyperglycemia: Secondary | ICD-10-CM

## 2017-06-13 DIAGNOSIS — E119 Type 2 diabetes mellitus without complications: Secondary | ICD-10-CM

## 2017-06-13 DIAGNOSIS — M549 Dorsalgia, unspecified: Secondary | ICD-10-CM

## 2017-06-13 MED ORDER — GLIPIZIDE 10 MG PO TABS: 10.0000 mg | ORAL_TABLET | Freq: Two times a day (BID) | ORAL | 0 refills | Status: DC

## 2017-06-13 MED ORDER — METFORMIN HCL ER 500 MG PO TB24: 1000.0000 mg | ORAL_TABLET | Freq: Two times a day (BID) | ORAL | 3 refills | Status: DC

## 2017-06-13 MED ORDER — MELOXICAM 15 MG PO TABS: 15.0000 mg | ORAL_TABLET | Freq: Every day | ORAL | 0 refills | Status: DC

## 2017-06-13 NOTE — Progress Notes (Signed)
Patient: [name] Female    DOB: 06-11-1975   42 y.o.   MRN: [id number] Visit Date: 06/13/2017  Today's Provider: Kallie Locks, FNP   Chief Complaint  Patient presents with  . Back Pain   Subjective:    HPI Patient is here today for follow up and Rx refilled. States that she has chronic back pain, and Motrin is not effective.   States that she had an incident of intense eye pain recently that lasted for a few days. States that her vision was 'in and out', migraine headache, blurred vision, and nausea.  She also states that she experienced numbness, pain, all extremities.   Denies fevers, infections, fatigue, chills, and night sweats. Denies cough, shortness of breath, chest pain. Denies abdominal pain, constipation,and blood in stools.   Allergies  Allergen Reactions  . Penicillins Hives   Previous Medications   GLIPIZIDE (GLUCOTROL) 10 MG TABLET    Take 1 tablet (10 mg total) by mouth 2 (two) times daily before a meal.   IBUPROFEN (ADVIL,MOTRIN) 800 MG TABLET    Take 1 tablet (800 mg total) by mouth 3 (three) times daily.   LIRAGLUTIDE 18 MG/3ML SOPN    Titration. Start at 1.2 mg and increase to 1.5 and finally to 1.8 if can tolerate.   METFORMIN (GLUCOPHAGE-XR) 500 MG 24 HR TABLET    Take 2 tablets (1,000 mg total) by mouth 2 (two) times daily before a meal.    Review of Systems  Gastrointestinal: Positive for abdominal distention.  Musculoskeletal: Positive for back pain.  All other systems reviewed and are negative.   Social History   Tobacco Use  . Smoking status: Never Smoker  . Smokeless tobacco: Never Used  Substance Use Topics  . Alcohol use: No   Objective:   BP 110/71   Pulse 75   Temp 98.4 F (36.9 C)   Wt 263 lb 12.8 oz (119.7 kg)   BMI 40.11 kg/m   Physical Exam  Constitutional: She is oriented to person, place, and time. She appears well-developed and well-nourished.  HENT:  Head: Normocephalic and atraumatic.  Right Ear: External  ear normal.  Left Ear: External ear normal.  Mouth/Throat: Oropharynx is clear and moist.  Eyes: Conjunctivae are normal. Pupils are equal, round, and reactive to light.  Neck: Normal range of motion. Neck supple.  Cardiovascular: Normal rate, regular rhythm, normal heart sounds and intact distal pulses.  Pulmonary/Chest: Effort normal and breath sounds normal.  Abdominal: Soft. She exhibits distension.  Musculoskeletal: Normal range of motion.  Neurological: She is alert and oriented to person, place, and time. She has normal reflexes.  Skin: Skin is warm and dry.  Psychiatric: She has a normal mood and affect. Her behavior is normal. Judgment and thought content normal.      Assessment & Plan:  1. Diabetes mellitus without complication (HCC) Will get basic labs today. Follow up in 1 week.   Refills for Metformin and Glucotrol to pharmacy.  2. Type 2 diabetes mellitus with hyperglycemia, without long-term current use of insulin (HCC) Last Glucose reading on 12/15/2016 was 250. Continue Metfomin and Glucotrol. Will evaluate labs.   3. Back pain, unspecified back location, unspecified back pain laterality, unspecified chronicity Chronic back pain. Rx of Mobic to pharmacy.  4. Eye pain, bilateral Recent history of 3 day eye pain. Will schedule consultation with Ophthalmologist at clinic.   5. Follow up Follow up in 1 week to evaluate labs. She will schedule to see  eye doctor here.  She has never had Mammogram so she will apply for Mammogram screening.    Kallie LocksNatalie M Charlyne Robertshaw, FNP   [facility]

## 2017-06-13 NOTE — Progress Notes (Signed)
NEEDS REFILLS ON GLUCATROL AND METFORMIN

## 2017-06-14 LAB — HEMOGLOBIN A1C
Est. average glucose Bld gHb Est-mCnc: 229 mg/dL
Hgb A1c MFr Bld: 9.6 % — ABNORMAL HIGH (ref 4.8–5.6)

## 2017-06-14 LAB — LIPID PANEL
Chol/HDL Ratio: 6.1 ratio — ABNORMAL HIGH (ref 0.0–4.4)
Cholesterol, Total: 184 mg/dL (ref 100–199)
HDL: 30 mg/dL — ABNORMAL LOW (ref >39)
LDL Calculated: 116 mg/dL — ABNORMAL HIGH (ref 0–99)
Triglycerides: 191 mg/dL — ABNORMAL HIGH (ref 0–149)
VLDL Cholesterol Cal: 38 mg/dL (ref 5–40)

## 2017-06-14 LAB — TSH: TSH: 3.05 u[IU]/mL (ref 0.450–4.500)

## 2017-06-20 ENCOUNTER — Ambulatory Visit: Payer: Self-pay | Admitting: Ophthalmology

## 2017-06-24 ENCOUNTER — Telehealth: Payer: Self-pay | Admitting: Pharmacist

## 2017-06-24 NOTE — Telephone Encounter (Signed)
06/24/17 Faxed [facility] refill request for Victoza Inject 1.8mg  once daily # 8 & Novofine 32G tips.Forde RadonAJ

## 2017-06-25 ENCOUNTER — Ambulatory Visit: Payer: Self-pay

## 2017-07-01 ENCOUNTER — Other Ambulatory Visit: Payer: Self-pay

## 2017-07-01 ENCOUNTER — Ambulatory Visit: Payer: Self-pay | Admitting: Pharmacist

## 2017-07-01 VITALS — BP 122/54 | Wt 266.0 lb

## 2017-07-01 DIAGNOSIS — Z79899 Other long term (current) drug therapy: Secondary | ICD-10-CM

## 2017-07-01 NOTE — Progress Notes (Signed)
  Medication Management Clinic Visit Note  Patient: [name] MRN: [id number] Date of Birth: Mar 02, 1976 PCP: System, Pcp Not In   [name] 42 y.o. female presents for a 6 month follow up MTM visit at Medication Management Clinic today.  There were no vitals taken for this visit.  Patient Information   Past Medical History:  Diagnosis Date  . Diabetes mellitus without complication (HCC)   . GERD (gastroesophageal reflux disease)   . Headache   . Hypertension       Past Surgical History:  Procedure Laterality Date  . CHOLECYSTECTOMY  1998     Family History  Problem Relation Age of Onset  . Kidney failure Father   . Pancreatic cancer Maternal Grandmother     Family Support: Good  Diet: Breakfast:Frosted mini-wheat cereal with maple syrup Lunch:tuna salad/mayo/pickle relish on bread, can of peaches, grilled cheese  Dinner: TV dinners (meatloaf and gravy), green beans, corn  Drinks:water and sweet tea (~one 32oz cup of sweet tea/day)   Exercise: None; would like to start walking with her church group friends    Social History   Substance and Sexual Activity  Alcohol Use No      Social History   Tobacco Use  Smoking Status Never Smoker  Smokeless Tobacco Never Used      Health Maintenance  Topic Date Due  . PNEUMOCOCCAL POLYSACCHARIDE VACCINE (1) 08/22/1975  . FOOT EXAM  08/22/1983  . OPHTHALMOLOGY EXAM  08/22/1983  . HIV Screening  08/21/1988  . TETANUS/TDAP  08/21/1992  . PAP SMEAR  08/22/1994  . INFLUENZA VACCINE  12/12/2016  . URINE MICROALBUMIN  04/26/2017  . HEMOGLOBIN A1C  12/11/2017     Assessment and Plan:  1. Compliance: Patient says misses medication dose ~once/week. Has tried pill box but forgets more when using pill box than the actual pill bottles.   2. Diabetes Type II: Victoza, glipizide and metformin. A1c (06/13/17) 9.6%; uncontrolled.  - [place] and  [facility] and [facility] Checked BG and was about 215 mg/dL then checked a little later and was 79 mg/dL. Counseled patient on how to manage lows=70mg /dL (drink 1/2 cup juice or soda or glucose tablets then wait 15 min and recheck. Repeat if needed. Eat meal with protein after low episode). - Recently restarted metformin about 2-3 weeks ago. Experiencing GI sxs such as N/V/D. Explained metformin can cause GI distress and should go away after a few weeks when consistently taking.  - Does not exercise but would like to start walking with church friends--Is hesitant to start walking with them now because she feels like she won't be able to keep up.  - Drinks one 32oz sweet tea/day. Patient says her weight at current appointments is down 30 pounds, but does not feel like she has lost any weight. States recently foods have been "blah and they all taste the same".  - Patient says she will work on cutting back on sweet tea and walking at least once a week.   3. HTN: Controlled on no BP medications; BP today 122/54 mmHg.   Follow up appointment for MTM in 6 months.   Cleopatra CedarStephanie Mohmed Farver, PharmD Pharmacy Resident

## 2017-07-02 ENCOUNTER — Ambulatory Visit: Payer: Self-pay

## 2017-07-04 ENCOUNTER — Ambulatory Visit: Payer: Self-pay | Admitting: Ophthalmology

## 2017-07-04 LAB — HM DIABETES EYE EXAM

## 2017-07-17 ENCOUNTER — Other Ambulatory Visit: Payer: Self-pay | Admitting: Ophthalmology

## 2017-07-29 ENCOUNTER — Other Ambulatory Visit: Payer: Self-pay

## 2017-07-29 ENCOUNTER — Encounter: Payer: Self-pay | Admitting: Emergency Medicine

## 2017-07-29 DIAGNOSIS — R51 Headache: Secondary | ICD-10-CM | POA: Insufficient documentation

## 2017-07-29 DIAGNOSIS — I10 Essential (primary) hypertension: Secondary | ICD-10-CM | POA: Insufficient documentation

## 2017-07-29 DIAGNOSIS — Z7984 Long term (current) use of oral hypoglycemic drugs: Secondary | ICD-10-CM | POA: Insufficient documentation

## 2017-07-29 DIAGNOSIS — E119 Type 2 diabetes mellitus without complications: Secondary | ICD-10-CM | POA: Insufficient documentation

## 2017-07-29 DIAGNOSIS — F329 Major depressive disorder, single episode, unspecified: Secondary | ICD-10-CM | POA: Insufficient documentation

## 2017-07-29 DIAGNOSIS — Z9049 Acquired absence of other specified parts of digestive tract: Secondary | ICD-10-CM | POA: Insufficient documentation

## 2017-07-29 NOTE — ED Triage Notes (Addendum)
Patient ambulatory to triage with steady gait, without difficulty or distress noted; pt reports x 3 wks having occipital HA radiating up into top of head and behind eyes accomp nausea; taking ibuprofen without relief; st hx of same and was dx with "virus in eyes"

## 2017-07-30 ENCOUNTER — Emergency Department
Admission: EM | Admit: 2017-07-30 | Discharge: 2017-07-30 | Disposition: A | Discharge status: Home / Self Care | Payer: Self-pay | Attending: Emergency Medicine | Admitting: Emergency Medicine

## 2017-07-30 ENCOUNTER — Emergency Department: Payer: Self-pay

## 2017-07-30 DIAGNOSIS — R519 Headache, unspecified: Secondary | ICD-10-CM

## 2017-07-30 DIAGNOSIS — R51 Headache: Secondary | ICD-10-CM

## 2017-07-30 LAB — CBC WITH DIFFERENTIAL/PLATELET
Basophils Absolute: 0 10*3/uL (ref 0–0.1)
Basophils Relative: 0 %
Eosinophils Absolute: 0.2 10*3/uL (ref 0–0.7)
Eosinophils Relative: 2 %
HCT: 33.6 % — ABNORMAL LOW (ref 35.0–47.0)
Hemoglobin: 10.7 g/dL — ABNORMAL LOW (ref 12.0–16.0)
Lymphocytes Relative: 25 %
Lymphs Abs: 2.1 10*3/uL (ref 1.0–3.6)
MCH: 23.1 pg — ABNORMAL LOW (ref 26.0–34.0)
MCHC: 31.8 g/dL — ABNORMAL LOW (ref 32.0–36.0)
MCV: 72.6 fL — ABNORMAL LOW (ref 80.0–100.0)
Monocytes Absolute: 0.7 10*3/uL (ref 0.2–0.9)
Monocytes Relative: 9 %
Neutro Abs: 5.4 10*3/uL (ref 1.4–6.5)
Neutrophils Relative %: 64 %
Platelets: 405 10*3/uL (ref 150–440)
RBC: 4.63 MIL/uL (ref 3.80–5.20)
RDW: 15.8 % — ABNORMAL HIGH (ref 11.5–14.5)
WBC: 8.5 10*3/uL (ref 3.6–11.0)

## 2017-07-30 LAB — BASIC METABOLIC PANEL
Anion gap: 12 (ref 5–15)
BUN: 8 mg/dL (ref 6–20)
CO2: 24 mmol/L (ref 22–32)
Calcium: 9.2 mg/dL (ref 8.9–10.3)
Chloride: 97 mmol/L — ABNORMAL LOW (ref 101–111)
Creatinine, Ser: 0.64 mg/dL (ref 0.44–1.00)
GFR calc Af Amer: >60 mL/min (ref >60)
GFR calc non Af Amer: >60 mL/min (ref >60)
Glucose, Bld: 81 mg/dL (ref 65–99)
Potassium: 3.5 mmol/L (ref 3.5–5.1)
Sodium: 133 mmol/L — ABNORMAL LOW (ref 135–145)

## 2017-07-30 MED ORDER — SODIUM CHLORIDE 0.9 % IV BOLUS (SEPSIS)
1000.0000 mL | Freq: Once | INTRAVENOUS | Status: AC
  Administered 2017-07-30: 1000 mL via INTRAVENOUS

## 2017-07-30 MED ORDER — BUTALBITAL-APAP-CAFFEINE 50-325-40 MG PO TABS: 1.0000 | ORAL_TABLET | ORAL | 0 refills | Status: DC | PRN

## 2017-07-30 MED ORDER — KETOROLAC TROMETHAMINE 30 MG/ML IJ SOLN
10.0000 mg | Freq: Once | INTRAMUSCULAR | Status: AC
  Administered 2017-07-30: 9.9 mg via INTRAVENOUS
  Filled 2017-07-30: qty 1

## 2017-07-30 NOTE — ED Notes (Signed)
Pt signed hard copy of E-signature. Placed in pt chart 

## 2017-07-30 NOTE — ED Notes (Signed)
Pt went to CT

## 2017-07-30 NOTE — Discharge Instructions (Signed)
1. You may take Fiorcet as needed for headaches. 2. Return to the ER for worsening symptoms, persistent vomiting, difficulty breathing or other concerns.

## 2017-07-30 NOTE — ED Notes (Signed)
Pt states that she was sick for 3 weeks and had the HA. She began to feel better but then the HA came back this Sunday. Pt feels like the HA is "swirling". States that HA starts in the lower  Posterior portion of her head and goes to the front of her head. Pt is alert and oriented x 4.

## 2017-07-30 NOTE — ED Provider Notes (Signed)
[facility] Emergency Department Provider Note   ____________________________________________   First MD Initiated Contact with Patient 07/30/17 0106     (approximate)  I have reviewed the triage vital signs and the nursing notes.   HISTORY  Chief Complaint Headache    HPI [name] is a 42 y.o. female who presents to the ED from home with a chief complaint of headache.  Patient reports occipital headache for the past 3 weeks, almost daily.  Taking ibuprofen without relief of symptoms.  Presents this morning because headaches have increased in intensity and frequency.  Symptoms associated with nausea only.  Denies associated vision changes, photophobia, neck pain, chest pain, shortness of breath, abdominal pain, vomiting, diarrhea.  History of preseptal cellulitis in right eye treated last summer with clindamycin.  Patient denies pain in her eyes, swollen eyelids or blurred vision.  Denies recent travel or trauma.   Past Medical History:  Diagnosis Date  . Diabetes mellitus without complication (HCC)   . GERD (gastroesophageal reflux disease)   . Headache   . Hypertension     Patient Active Problem List   Diagnosis Date Noted  . Obesity 05/01/2016  . Depression 05/01/2016  . Metrorrhagia 11/10/2015  . Diabetes (HCC) 03/10/2015  . Anemia 03/10/2015  . Edema 03/10/2015    Past Surgical History:  Procedure Laterality Date  . CHOLECYSTECTOMY  1998    Prior to Admission medications   Medication Sig Start Date End Date Taking? Authorizing Provider  glipiZIDE (GLUCOTROL) 10 MG tablet Take 1 tablet (10 mg total) by mouth 2 (two) times daily before a meal. 06/13/17   Kallie Locks, FNP  ibuprofen (ADVIL,MOTRIN) 800 MG tablet Take 1 tablet (800 mg total) by mouth 3 (three) times daily. Patient not taking: Reported on 07/01/2017 01/24/14   Ladona Mow, PA-C  liraglutide 18 MG/3ML SOPN Titration. Start at 1.2 mg and increase to 1.5 and  finally to 1.8 if can tolerate. Patient taking differently: 1.8 mg. Titration. Start at 1.2 mg and increase to 1.5 and finally to 1.8 if can tolerate. 05/01/16   Nehemiah Settle, PA  meloxicam (MOBIC) 15 MG tablet Take 1 tablet (15 mg total) by mouth daily. Patient not taking: Reported on 07/01/2017 06/13/17   Kallie Locks, FNP  metFORMIN (GLUCOPHAGE-XR) 500 MG 24 hr tablet Take 2 tablets (1,000 mg total) by mouth 2 (two) times daily before a meal. 06/13/17   Kallie Locks, FNP    Allergies Penicillins  Family History  Problem Relation Age of Onset  . Kidney failure Father   . Pancreatic cancer Maternal Grandmother     Social History Social History   Tobacco Use  . Smoking status: Never Smoker  . Smokeless tobacco: Never Used  Substance Use Topics  . Alcohol use: No  . Drug use: No    Review of Systems  Constitutional: No fever/chills. Eyes: No visual changes. ENT: No sore throat. Cardiovascular: Denies chest pain. Respiratory: Denies shortness of breath. Gastrointestinal: No abdominal pain.  No nausea, no vomiting.  No diarrhea.  No constipation. Genitourinary: Negative for dysuria. Musculoskeletal: Negative for back pain. Skin: Negative for rash. Neurological: Positive for headache.  Negative for focal weakness or numbness.   ____________________________________________   PHYSICAL EXAM:  VITAL SIGNS: ED Triage Vitals  Enc Vitals Group     BP 07/29/17 2145 (!) 153/84     Pulse Rate 07/29/17 2145 74     Resp 07/29/17 2145 18  Temp 07/29/17 2145 97.8 F (36.6 C)     Temp Source 07/29/17 2145 Oral     SpO2 07/29/17 2145 100 %     Weight 07/29/17 2146 269 lb (122 kg)     Height 07/29/17 2146 5\' 8"  (1.727 m)     Head Circumference --      Peak Flow --      Pain Score 07/29/17 2146 7     Pain Loc --      Pain Edu? --      Excl. in GC? --     Constitutional: Alert and oriented. Well appearing and in no acute distress. Eyes: Conjunctivae are  normal. PERRL. EOMI. Funduscopy within normal limits. Head: Atraumatic.  No tenderness to palpation to temples or sinuses. Nose: No congestion/rhinnorhea. Mouth/Throat: Mucous membranes are moist.  Oropharynx non-erythematous. Neck: No stridor.  Supple neck without meningismus.  No carotid bruits. Cardiovascular: Normal rate, regular rhythm. Grossly normal heart sounds.  Good peripheral circulation. Respiratory: Normal respiratory effort.  No retractions. Lungs CTAB. Gastrointestinal: Soft and nontender. No distention. No abdominal bruits. No CVA tenderness. Musculoskeletal: No lower extremity tenderness nor edema.  No joint effusions. Neurologic: Alert and oriented x3.  CN II-XII grossly intact.  Normal speech and language. No gross focal neurologic deficits are appreciated. MAEx4. No gait instability. Skin:  Skin is warm, dry and intact. No rash noted.  No petechiae. Psychiatric: Mood and affect are normal. Speech and behavior are normal.  ____________________________________________   LABS (all labs ordered are listed, but only abnormal results are displayed)  Labs Reviewed  CBC WITH DIFFERENTIAL/PLATELET - Abnormal; Notable for the following components:      Result Value   Hemoglobin 10.7 (*)    HCT 33.6 (*)    MCV 72.6 (*)    MCH 23.1 (*)    MCHC 31.8 (*)    RDW 15.8 (*)    All other components within normal limits  BASIC METABOLIC PANEL - Abnormal; Notable for the following components:   Sodium 133 (*)    Chloride 97 (*)    All other components within normal limits   ____________________________________________  EKG  None ____________________________________________  RADIOLOGY  ED MD interpretation: No ICH  Official radiology report(s): Ct Head Wo Contrast  Result Date: 07/30/2017 CLINICAL DATA:  Three weeks of occipital headache EXAM: CT HEAD WITHOUT CONTRAST TECHNIQUE: Contiguous axial images were obtained from the base of the skull through the vertex without  intravenous contrast. COMPARISON:  None. FINDINGS: BRAIN: The ventricles and sulci are normal. No intraparenchymal hemorrhage, mass effect nor midline shift. No acute large vascular territory infarcts. Grey-white matter distinction is maintained. The basal ganglia are unremarkable. No abnormal extra-axial fluid collections. Basal cisterns are not effaced and midline. The brainstem and cerebellar hemispheres are without acute abnormalities. VASCULAR: Unremarkable. SKULL/SOFT TISSUES: No skull fracture. No significant soft tissue swelling. ORBITS/SINUSES: The included ocular globes and orbital contents are normal.The mastoid air cells are clear. The included paranasal sinuses are well-aerated. OTHER: None. IMPRESSION: No acute intracranial abnormality. Electronically Signed   By: Tollie Ethavid  Kwon M.D.   On: 07/30/2017 02:58    ____________________________________________   PROCEDURES  Procedure(s) performed: None  Procedures  Critical Care performed: No  ____________________________________________   INITIAL IMPRESSION / ASSESSMENT AND PLAN / ED COURSE  As part of my medical decision making, I reviewed the following data within the electronic MEDICAL RECORD NUMBER Nursing notes reviewed and incorporated, Labs reviewed, Old chart reviewed, Radiograph reviewed  and Notes from  prior ED visits   42 year old female who presents with a 3-week history of headache. Differential diagnosis includes, but is not limited to, intracranial hemorrhage, meningitis/encephalitis, previous head trauma, cavernous venous thrombosis, tension headache, temporal arteritis, migraine or migraine equivalent, idiopathic intracranial hypertension, and non-specific headache.  Patient is well-appearing without focal neurological deficits.  Neck is supple without meningismus.  She is a diabetic.  Will obtain basic lab work, CT imaging, administer analgesia and reassess.  Clinical Course as of Jul 31 427  Tue Jul 30, 2017  0428  Headache significantly improved.  Patient is resting no acute distress.  Updated patient and her mother of all laboratory and imaging results.  Will discharge home on Fioricet and patient will follow-up with her PCP closely.  Strict return precautions given.  Both verbalize understanding and agree with plan of care.  [JS]    Clinical Course User Index [JS] Irean Hong, MD     ____________________________________________   FINAL CLINICAL IMPRESSION(S) / ED DIAGNOSES  Final diagnoses:  Acute nonintractable headache, unspecified headache type     ED Discharge Orders    None       Note:  This document was prepared using Dragon voice recognition software and may include unintentional dictation errors.    Irean Hong, MD 07/30/17 [phone]

## 2017-08-14 ENCOUNTER — Ambulatory Visit: Payer: Self-pay | Attending: Oncology | Admitting: *Deleted

## 2017-08-14 ENCOUNTER — Ambulatory Visit
Admission: RE | Admit: 2017-08-14 | Discharge: 2017-08-14 | Disposition: A | Discharge status: Home / Self Care | Payer: Self-pay | Source: Ambulatory Visit | Attending: Oncology | Admitting: Oncology

## 2017-08-14 VITALS — BP 104/66 | HR 69 | Temp 97.5°F | Ht 67.0 in | Wt 269.0 lb

## 2017-08-14 DIAGNOSIS — Z0000 Encounter for general adult medical examination without abnormal findings: Secondary | ICD-10-CM

## 2017-08-14 NOTE — Patient Instructions (Signed)
Diabetes Mellitus and Nutrition When you have diabetes (diabetes mellitus), it is very important to have healthy eating habits because your blood sugar (glucose) levels are greatly affected by what you eat and drink. Eating healthy foods in the appropriate amounts, at about the same times every day, can help you:  Control your blood glucose.  Lower your risk of heart disease.  Improve your blood pressure.  Reach or maintain a healthy weight.  Every person with diabetes is different, and each person has different needs for a meal plan. Your health care provider may recommend that you work with a diet and nutrition specialist (dietitian) to make a meal plan that is best for you. Your meal plan may vary depending on factors such as:  The calories you need.  The medicines you take.  Your weight.  Your blood glucose, blood pressure, and cholesterol levels.  Your activity level.  Other health conditions you have, such as heart or kidney disease.  How do carbohydrates affect me? Carbohydrates affect your blood glucose level more than any other type of food. Eating carbohydrates naturally increases the amount of glucose in your blood. Carbohydrate counting is a method for keeping track of how many carbohydrates you eat. Counting carbohydrates is important to keep your blood glucose at a healthy level, especially if you use insulin or take certain oral diabetes medicines. It is important to know how many carbohydrates you can safely have in each meal. This is different for every person. Your dietitian can help you calculate how many carbohydrates you should have at each meal and for snack. Foods that contain carbohydrates include:  Bread, cereal, rice, pasta, and crackers.  Potatoes and corn.  Peas, beans, and lentils.  Milk and yogurt.  Fruit and juice.  Desserts, such as cakes, cookies, ice cream, and candy.  How does alcohol affect me? Alcohol can cause a sudden decrease in blood  glucose (hypoglycemia), especially if you use insulin or take certain oral diabetes medicines. Hypoglycemia can be a life-threatening condition. Symptoms of hypoglycemia (sleepiness, dizziness, and confusion) are similar to symptoms of having too much alcohol. If your health care provider says that alcohol is safe for you, follow these guidelines:  Limit alcohol intake to no more than 1 drink per day for nonpregnant women and 2 drinks per day for men. One drink equals 12 oz of beer, 5 oz of wine, or 1 oz of hard liquor.  Do not drink on an empty stomach.  Keep yourself hydrated with water, diet soda, or unsweetened iced tea.  Keep in mind that regular soda, juice, and other mixers may contain a lot of sugar and must be counted as carbohydrates.  What are tips for following this plan? Reading food labels  Start by checking the serving size on the label. The amount of calories, carbohydrates, fats, and other nutrients listed on the label are based on one serving of the food. Many foods contain more than one serving per package.  Check the total grams (g) of carbohydrates in one serving. You can calculate the number of servings of carbohydrates in one serving by dividing the total carbohydrates by 15. For example, if a food has 30 g of total carbohydrates, it would be equal to 2 servings of carbohydrates.  Check the number of grams (g) of saturated and trans fats in one serving. Choose foods that have low or no amount of these fats.  Check the number of milligrams (mg) of sodium in one serving. Most people   should limit total sodium intake to less than 2,300 mg per day.  Always check the nutrition information of foods labeled as "low-fat" or "nonfat". These foods may be higher in added sugar or refined carbohydrates and should be avoided.  Talk to your dietitian to identify your daily goals for nutrients listed on the label. Shopping  Avoid buying canned, premade, or processed foods. These  foods tend to be high in fat, sodium, and added sugar.  Shop around the outside edge of the grocery store. This includes fresh fruits and vegetables, bulk grains, fresh meats, and fresh dairy. Cooking  Use low-heat cooking methods, such as baking, instead of high-heat cooking methods like deep frying.  Cook using healthy oils, such as olive, canola, or sunflower oil.  Avoid cooking with butter, cream, or high-fat meats. Meal planning  Eat meals and snacks regularly, preferably at the same times every day. Avoid going long periods of time without eating.  Eat foods high in fiber, such as fresh fruits, vegetables, beans, and whole grains. Talk to your dietitian about how many servings of carbohydrates you can eat at each meal.  Eat 4-6 ounces of lean protein each day, such as lean meat, chicken, fish, eggs, or tofu. 1 ounce is equal to 1 ounce of meat, chicken, or fish, 1 egg, or 1/4 cup of tofu.  Eat some foods each day that contain healthy fats, such as avocado, nuts, seeds, and fish. Lifestyle   Check your blood glucose regularly.  Exercise at least 30 minutes 5 or more days each week, or as told by your health care provider.  Take medicines as told by your health care provider.  Do not use any products that contain nicotine or tobacco, such as cigarettes and e-cigarettes. If you need help quitting, ask your health care provider.  Work with a Veterinary surgeoncounselor or diabetes educator to identify strategies to manage stress and any emotional and social challenges. What are some questions to ask my health care provider?  Do I need to meet with a diabetes educator?  Do I need to meet with a dietitian?  What number can I call if I have questions?  When are the best times to check my blood glucose? Where to find more information:  American Diabetes Association: diabetes.org/food-and-fitness/food  Academy of Nutrition and Dietetics:  https://www.vargas.com/www.eatright.org/resources/health/diseases-and-conditions/diabetes  [employer] Institute of Diabetes and Digestive and Kidney Diseases (NIH): FindJewelers.czwww.niddk.nih.gov/health-information/diabetes/overview/diet-eating-physical-activity Summary  A healthy meal plan will help you control your blood glucose and maintain a healthy lifestyle.  Working with a diet and nutrition specialist (dietitian) can help you make a meal plan that is best for you.  Keep in mind that carbohydrates and alcohol have immediate effects on your blood glucose levels. It is important to count carbohydrates and to use alcohol carefully. This information is not intended to replace advice given to you by your health care provider. Make sure you discuss any questions you have with your health care provider. Document Released: 01/25/2005 Document Revised: 06/04/2016 Document Reviewed: 06/04/2016 Elsevier Interactive Patient Education  2018 [employer] HPV Test The human papillomavirus (HPV) test is used to look for high-risk types of HPV infection. HPV is a group of about 100 viruses. Many of these viruses cause growths on, in, or around the genitals. Most HPV viruses cause infections that usually go away without treatment. However, HPV types 6, 11, 16, and 18 are considered high-risk types of HPV that can increase your risk of cancer of the cervix or anus if the infection  is left untreated. An HPV test identifies the DNA (genetic) strands of the HPV infection, so it is also referred to as the HPV DNA test. Although HPV is found in both males and females, the HPV test is only used to screen for increased cancer risk in females:  With an abnormal Pap test.  After treatment of an abnormal Pap test.  Between the ages of 35 and 65.  After treatment of a high-risk HPV infection.  The HPV test may be done at the same time as a pelvic exam and Pap test in females over the age of 95. Both the HPV test and Pap test require a sample of  cells from the cervix. How do I prepare for this test?  Do not douche or take a bath for 24-48 hours before the test or as directed by your health care provider.  Do not have sex for 24-48 hours before the test or as directed by your health care provider.  You may be asked to reschedule the test if you are menstruating.  You will be asked to urinate before the test. What do the results mean? It is your responsibility to obtain your test results. Ask the lab or department performing the test when and how you will get your results. Talk with your health care provider if you have any questions about your results. Your result will be negative or positive. Meaning of Negative Test Results A negative HPV test result means that no HPV was found, and it is very likely that you do not have HPV. Meaning of Positive Test Results A positive HPV test result indicates that you have HPV.  If your test result shows the presence of any high-risk HPV strains, you may have an increased risk of developing cancer of the cervix or anus if the infection is left untreated.  If any low-risk HPV strains are found, you are not likely to have an increased risk of cancer.  Discuss your test results with your health care provider. He or she will use the results to make a diagnosis and determine a treatment plan that is right for you. Talk with your health care provider to discuss your results, treatment options, and if necessary, the need for more tests. Talk with your health care provider if you have any questions about your results. This information is not intended to replace advice given to you by your health care provider. Make sure you discuss any questions you have with your health care provider. Document Released: 05/25/2004 Document Revised: 01/04/2016 Document Reviewed: 09/15/2013 Elsevier Interactive Patient Education  2018 [employer].

## 2017-08-14 NOTE — Progress Notes (Signed)
Subjective:     Patient ID: [name], female   DOB: Aug 30, 1975, 42 y.o.   MRN: [id number]  HPI   Review of Systems     Objective:   Physical Exam  Pulmonary/Chest: Right breast exhibits no inverted nipple, no mass, no nipple discharge, no skin change and no tenderness. Left breast exhibits no inverted nipple, no mass, no nipple discharge, no skin change and no tenderness. Breasts are asymmetrical.  Right breast larger than the left  Abdominal: There is no splenomegaly or hepatomegaly.  Genitourinary: No labial fusion. There is no rash, tenderness, lesion or injury on the right labia. There is no rash, tenderness, lesion or injury on the left labia. No erythema, tenderness or bleeding in the vagina. No foreign body in the vagina. No signs of injury around the vagina. No vaginal discharge found.       Assessment:     42 year old Black female referred from the [facility] for clinical breast exam, pap and mammogram.  Clinical breast exam unremarkable.  Taught self breast awareness.  On pelvic exam I was unable to view the cervix, and the patient was experiencing a lot of discomfort. Patient is very obese, and states she has never had intercourse.  I discussed case with Dr. Sonia SideSecord, and she came in and examined the patient.  She was unable to view the cervix, but collected a specimen for HPV genotypes 16/18.  Dr. Sonia SideSecord discussed role of HPV as risk factors for cervical cancer and her decreased risk of cervical cancer since she has never had sex.  She also discussed risk factors for increased risk of endometrial cancer related to her weight.  Dr. Sonia SideSecord recommended she make healthy eating choices and try to start loosing weight. Patient has been screened for eligibility.  She does not have any insurance, Medicare or Medicaid.  She also meets financial eligibility.  Hand-out given on the Affordable Care Act.    Plan:     Screening mammogram ordered.  Specimen for HPV sent to the  lab.  Hand-out on a diabetic diet and HPV given to patient.  She states [facility].  Will follow-up per [facility] protocol.

## 2017-08-15 ENCOUNTER — Other Ambulatory Visit: Payer: Self-pay | Admitting: *Deleted

## 2017-08-20 LAB — HPV GENOTYPES 16/18,45
HPV Genotype 16: NEGATIVE
HPV Genotype 18,45: NEGATIVE

## 2017-08-22 ENCOUNTER — Ambulatory Visit: Payer: Self-pay | Admitting: Ophthalmology

## 2017-08-29 ENCOUNTER — Encounter: Payer: Self-pay | Admitting: *Deleted

## 2017-08-29 NOTE — Progress Notes (Signed)
Called and informed patient of her normal mammogram and negative HPV findings.  Encouraged her to return in one year for her annual screening and mammogram.  [facility] to [place].

## 2017-10-08 ENCOUNTER — Encounter: Payer: Self-pay | Admitting: Emergency Medicine

## 2017-10-08 DIAGNOSIS — F329 Major depressive disorder, single episode, unspecified: Secondary | ICD-10-CM | POA: Insufficient documentation

## 2017-10-08 DIAGNOSIS — E119 Type 2 diabetes mellitus without complications: Secondary | ICD-10-CM | POA: Insufficient documentation

## 2017-10-08 DIAGNOSIS — Z7984 Long term (current) use of oral hypoglycemic drugs: Secondary | ICD-10-CM | POA: Insufficient documentation

## 2017-10-08 DIAGNOSIS — H109 Unspecified conjunctivitis: Secondary | ICD-10-CM | POA: Insufficient documentation

## 2017-10-08 DIAGNOSIS — Z9049 Acquired absence of other specified parts of digestive tract: Secondary | ICD-10-CM | POA: Insufficient documentation

## 2017-10-08 DIAGNOSIS — I10 Essential (primary) hypertension: Secondary | ICD-10-CM | POA: Insufficient documentation

## 2017-10-08 NOTE — ED Notes (Signed)
VISUAL ACUITY   BOTH EYES- 20/50  RIGHT EYE- 20/70  LEFT EYE- 20/100

## 2017-10-08 NOTE — ED Triage Notes (Signed)
Pt reports she has had sharpe pain in the left eye and sts, "It feels really cold at times. I can feel something wiggling back behind my eye." Pts left eye is red and clear drainage noted. Pt denies injury.

## 2017-10-09 ENCOUNTER — Emergency Department
Admission: EM | Admit: 2017-10-09 | Discharge: 2017-10-09 | Disposition: A | Discharge status: Home / Self Care | Payer: Self-pay | Attending: Emergency Medicine | Admitting: Emergency Medicine

## 2017-10-09 DIAGNOSIS — H109 Unspecified conjunctivitis: Secondary | ICD-10-CM

## 2017-10-09 MED ORDER — ERYTHROMYCIN 5 MG/GM OP OINT: TOPICAL_OINTMENT | Freq: Three times a day (TID) | OPHTHALMIC | 0 refills | Status: AC

## 2017-10-09 MED ORDER — TETRACAINE HCL 0.5 % OP SOLN
OPHTHALMIC | Status: AC
  Filled 2017-10-09: qty 4

## 2017-10-09 MED ORDER — FLUORESCEIN SODIUM 1 MG OP STRP
ORAL_STRIP | OPHTHALMIC | Status: AC
  Filled 2017-10-09: qty 1

## 2017-10-09 MED ORDER — ERYTHROMYCIN 5 MG/GM OP OINT
TOPICAL_OINTMENT | Freq: Once | OPHTHALMIC | Status: AC
  Administered 2017-10-09: 1 via OPHTHALMIC
  Filled 2017-10-09: qty 1

## 2017-10-09 NOTE — ED Provider Notes (Signed)
[facility] Emergency Department Provider Note   ____________________________________________   First MD Initiated Contact with Patient 10/09/17 [phone]     (approximate)  I have reviewed the triage vital signs and the nursing notes.   HISTORY  Chief Complaint Eye Problem    HPI [name] is a 42 y.o. female who comes into the hospital today feeling like she has glass in her left eye.  She reports that whenever she closes her eyes she feels something wiggling around behind her eye.  The patient states that the symptoms started yesterday.  Her eye was initially irritated so she kept rubbing it and became more irritated and became worse.  She also feels like something is wiggling behind her eyelid.  She states that something similar happened last year and she was told that she had an infection in her eye but she is unsure exactly what kind of infection.  The patient denied any blurred vision but states that when they checked her vision out front the left was less.  The patient has a history of diabetes but has not been taking all of her diabetes medications for the last few days.  The patient is here today for evaluation   Past Medical History:  Diagnosis Date  . Diabetes mellitus without complication (HCC)   . GERD (gastroesophageal reflux disease)   . Headache   . Hypertension     Patient Active Problem List   Diagnosis Date Noted  . Obesity 05/01/2016  . Depression 05/01/2016  . Metrorrhagia 11/10/2015  . Diabetes (HCC) 03/10/2015  . Anemia 03/10/2015  . Edema 03/10/2015    Past Surgical History:  Procedure Laterality Date  . CHOLECYSTECTOMY  1998    Prior to Admission medications   Medication Sig Start Date End Date Taking? Authorizing Provider  butalbital-acetaminophen-caffeine (FIORICET, ESGIC) [id number] MG tablet Take 1 tablet by mouth every 4 (four) hours as needed for headache. 07/30/17   Irean Hong, MD  erythromycin [facility])  ophthalmic ointment Place into both eyes 3 (three) times daily for 10 days. Place a 1/2 inch ribbon of ointment into the lower eyelid. 10/09/17 10/19/17  Rebecka Apley, MD  glipiZIDE (GLUCOTROL) 10 MG tablet Take 1 tablet (10 mg total) by mouth 2 (two) times daily before a meal. 06/13/17   Kallie Locks, FNP  ibuprofen (ADVIL,MOTRIN) 800 MG tablet Take 1 tablet (800 mg total) by mouth 3 (three) times daily. Patient not taking: Reported on 07/01/2017 01/24/14   Ladona Mow, PA-C  liraglutide 18 MG/3ML SOPN Titration. Start at 1.2 mg and increase to 1.5 and finally to 1.8 if can tolerate. Patient taking differently: 1.8 mg. Titration. Start at 1.2 mg and increase to 1.5 and finally to 1.8 if can tolerate. 05/01/16   Nehemiah Settle, PA  meloxicam (MOBIC) 15 MG tablet Take 1 tablet (15 mg total) by mouth daily. Patient not taking: Reported on 07/01/2017 06/13/17   Kallie Locks, FNP  metFORMIN (GLUCOPHAGE-XR) 500 MG 24 hr tablet Take 2 tablets (1,000 mg total) by mouth 2 (two) times daily before a meal. 06/13/17   Kallie Locks, FNP    Allergies Penicillins  Family History  Problem Relation Age of Onset  . Kidney failure Father   . Pancreatic cancer Maternal Grandmother     Social History Social History   Tobacco Use  . Smoking status: Never Smoker  . Smokeless tobacco: Never Used  Substance Use Topics  . Alcohol use: No  .  Drug use: No    Review of Systems  Constitutional: No fever/chills Eyes: Left eye pain and drainage ENT: No sore throat. Cardiovascular: Denies chest pain. Respiratory: Denies shortness of breath. Gastrointestinal: No abdominal pain.   Genitourinary: Negative for dysuria. Musculoskeletal: Negative for back pain. Skin: Negative for rash. Neurological: Negative for headaches   ____________________________________________   PHYSICAL EXAM:  VITAL SIGNS: ED Triage Vitals [10/08/17 2238]  Enc Vitals Group     BP 130/80     Pulse Rate 85      Resp 17     Temp 97.7 F (36.5 C)     Temp Source Oral     SpO2 99 %     Weight 269 lb (122 kg)     Height      Head Circumference      Peak Flow      Pain Score      Pain Loc      Pain Edu?      Excl. in GC?     Constitutional: Alert and oriented. Well appearing and in mild distress. Eyes: Conjunctivae injected and sclera erythematous. PERRL. EOMI. mild upper lid edema on the left eye Head: Atraumatic. Nose: No congestion/rhinnorhea. Mouth/Throat: Mucous membranes are moist.  Oropharynx non-erythematous. }Cardiovascular: Normal rate, regular rhythm. Grossly normal heart sounds.  Good peripheral circulation. Respiratory: Normal respiratory effort.  No retractions. Lungs CTAB. Gastrointestinal: Soft and nontender. No distention.  Positive bowel sounds Musculoskeletal: No lower extremity tenderness nor edema.   Neurologic:  Normal speech and language.  Skin:  Skin is warm, dry and intact.  Psychiatric: Mood and affect are normal.  ____________________________________________   LABS (all labs ordered are listed, but only abnormal results are displayed)  Labs Reviewed - No data to display ____________________________________________  EKG  None ____________________________________________  RADIOLOGY  ED MD interpretation: None  Official radiology report(s): No results found.  ____________________________________________   PROCEDURES  Procedure(s) performed: None  Procedures  Critical Care performed: No  ____________________________________________   INITIAL IMPRESSION / ASSESSMENT AND PLAN / ED COURSE  As part of my medical decision making, I reviewed the following data within the electronic MEDICAL RECORD NUMBER Notes from prior ED visits and [place] Controlled Substance Database   This is a 42 year old female who comes into the hospital today with some eye pain and drainage.  The patient feels like something is in her eye.  We did perform fluorescein staining  and the patient did not have any uptake.  Her intraocular pressure was 19 and 18 on the left eye.  I did flip the patient's lid and did not notice any foreign body as well.  I feel that the patient may have some conjunctivitis.  I will give the patient some erythromycin ointment and she will be discharged to follow-up with the eye doctor.  The patient has no other concerns or complaints.      ____________________________________________   FINAL CLINICAL IMPRESSION(S) / ED DIAGNOSES  Final diagnoses:  Conjunctivitis of left eye, unspecified conjunctivitis type     ED Discharge Orders        Ordered    erythromycin [facility]) ophthalmic ointment  3 times daily     10/09/17 0410       Note:  This document was prepared using Dragon voice recognition software and may include unintentional dictation errors.    Rebecka Apley, MD 10/09/17 [phone]

## 2017-10-09 NOTE — Discharge Instructions (Addendum)
It appears that you may have some conjunctivitis, please follow-up with ophthalmology for further evaluation of your eye discomfort.  Please ensure that you are taking medications appropriately.

## 2017-10-31 ENCOUNTER — Other Ambulatory Visit: Payer: Self-pay | Admitting: Internal Medicine

## 2017-10-31 DIAGNOSIS — E119 Type 2 diabetes mellitus without complications: Secondary | ICD-10-CM

## 2017-11-06 ENCOUNTER — Encounter: Payer: Self-pay | Admitting: Ophthalmology

## 2017-11-06 ENCOUNTER — Ambulatory Visit: Payer: Self-pay | Admitting: Ophthalmology

## 2017-11-21 ENCOUNTER — Other Ambulatory Visit: Payer: Self-pay

## 2017-11-21 ENCOUNTER — Telehealth: Payer: Self-pay | Admitting: Pharmacy Technician

## 2017-11-21 NOTE — Telephone Encounter (Signed)
Received updated proof of income.  Patient eligible to receive medication assistance at [facility] through 2019, as long as eligibility requirements continue to be met.  [facility]

## 2017-11-28 ENCOUNTER — Ambulatory Visit: Payer: Self-pay

## 2018-01-09 ENCOUNTER — Ambulatory Visit: Payer: Self-pay

## 2018-01-16 ENCOUNTER — Ambulatory Visit: Payer: Self-pay | Admitting: Adult Health Nurse Practitioner

## 2018-01-16 VITALS — BP 110/74 | HR 73 | Ht 66.5 in | Wt 259.0 lb

## 2018-01-16 DIAGNOSIS — E118 Type 2 diabetes mellitus with unspecified complications: Secondary | ICD-10-CM

## 2018-01-16 DIAGNOSIS — M545 Low back pain, unspecified: Secondary | ICD-10-CM

## 2018-01-16 DIAGNOSIS — M549 Dorsalgia, unspecified: Secondary | ICD-10-CM | POA: Insufficient documentation

## 2018-01-16 DIAGNOSIS — R51 Headache: Secondary | ICD-10-CM

## 2018-01-16 DIAGNOSIS — D649 Anemia, unspecified: Secondary | ICD-10-CM

## 2018-01-16 DIAGNOSIS — R519 Headache, unspecified: Secondary | ICD-10-CM

## 2018-01-16 MED ORDER — TOPIRAMATE 25 MG PO CPSP: 25.0000 mg | ORAL_CAPSULE | Freq: Every day | ORAL | 0 refills | Status: DC

## 2018-01-16 NOTE — Progress Notes (Signed)
Subjective:    Patient ID: [name], female    DOB: Aug 23, 1975, 42 y.o.   MRN: [id number]  HPI  [name] is a 42 yo F here for 6 mo f/u for Type 2 DM. She presents w/ cc of headache and back pain. Headache: she reports R frontal pain for 3 days w/ no relief from advil. She was seen for headaches in March and was Rx Butalbital after having pain for 3 weeks. She reports Butalbital worked for [employer] but then stopped. She endorses vision changes with blurriness. She cannot anticipate the headache. She endorses light sensitivity.   Back pain: she reports pelvic pain that radiates to low back while sitting and lying down for several weeks. She reports some radiating pain down to her knees. She denies she could be pregnant.  DM: She reports she has not been taking Metformin and Glipizide because she has been having diarrhea.   Patient Active Problem List   Diagnosis Date Noted  . Headache 01/16/2018  . Back pain 01/16/2018  . Obesity 05/01/2016  . Depression 05/01/2016  . Metrorrhagia 11/10/2015  . Diabetes (HCC) 03/10/2015  . Anemia 03/10/2015  . Edema 03/10/2015   Allergies as of 01/16/2018      Reactions   Penicillins Hives      Medication List        Accurate as of 01/16/18  7:41 PM. Always use your most recent med list.          butalbital-acetaminophen-caffeine 50-325-40 MG tablet Commonly known as:  FIORICET, ESGIC Take 1 tablet by mouth every 4 (four) hours as needed for headache.   glipiZIDE 10 MG tablet Commonly known as:  GLUCOTROL TAKE ONE TABLET BY MOUTH 2 TIMES A DAY BEFORE A MEAL   ibuprofen 800 MG tablet Commonly known as:  ADVIL,MOTRIN Take 1 tablet (800 mg total) by mouth 3 (three) times daily.   liraglutide 18 MG/3ML Sopn Commonly known as:  VICTOZA Titration. Start at 1.2 mg and increase to 1.5 and finally to 1.8 if can tolerate.   meloxicam 15 MG tablet Commonly known as:  MOBIC Take 1 tablet (15 mg total) by mouth daily.     metFORMIN 500 MG 24 hr tablet Commonly known as:  GLUCOPHAGE-XR Take 2 tablets (1,000 mg total) by mouth 2 (two) times daily before a meal.        Review of Systems  Musculoskeletal: Positive for back pain.  Neurological: Positive for headaches.  All other systems reviewed and are negative.      Objective:   Physical Exam  Constitutional: She is oriented to person, place, and time. She appears well-developed and well-nourished.  Cardiovascular: Normal rate, regular rhythm, normal heart sounds and intact distal pulses.  Pulmonary/Chest: Effort normal and breath sounds normal.  Abdominal: Soft. Bowel sounds are normal.  Musculoskeletal: Normal range of motion.       Back:  Bilateral pedal edema, R > L  Neurological: She is alert and oriented to person, place, and time.  Skin: Skin is warm and dry.  Psychiatric: She has a normal mood and affect. Her behavior is normal. Judgment and thought content normal.  Vitals reviewed.   BP 110/74 (BP Location: Left Arm, Patient Position: Sitting)   Pulse 73   Ht 5' 6.5" (1.689 m)   Wt 259 lb (117.5 kg)   LMP 01/02/2018 (Approximate)   SpO2 99%   BMI 41.18 kg/m        Assessment & Plan:  Routine labs tonight  Migraines: Begin Topamax 25mg  Advised pt of side effects.   If no relief, refer to Neuro    Low back pain w/ bilateral hip pain: Order DG Low back   DM:  Check A1C tonight Pt needs a glucose monitor - will pick up tonight.  Pt will start Glipizide again and Victoza continue  Evaluate treatment with lab results. Discussed transient symptoms with Metformin- will hold for now until A1c results.   F/u in 2 weeks to evaluate migraines and DM treatment

## 2018-01-17 LAB — CBC
Hematocrit: 32.4 % — ABNORMAL LOW (ref 34.0–46.6)
Hemoglobin: 10 g/dL — ABNORMAL LOW (ref 11.1–15.9)
MCH: 23.1 pg — ABNORMAL LOW (ref 26.6–33.0)
MCHC: 30.9 g/dL — ABNORMAL LOW (ref 31.5–35.7)
MCV: 75 fL — ABNORMAL LOW (ref 79–97)
Platelets: 465 10*3/uL — ABNORMAL HIGH (ref 150–450)
RBC: 4.33 x10E6/uL (ref 3.77–5.28)
RDW: 14.1 % (ref 12.3–15.4)
WBC: 8.3 10*3/uL (ref 3.4–10.8)

## 2018-01-17 LAB — COMPREHENSIVE METABOLIC PANEL
ALT: 13 IU/L (ref 0–32)
AST: 14 IU/L (ref 0–40)
Albumin/Globulin Ratio: 1.5 (ref 1.2–2.2)
Albumin: 4.2 g/dL (ref 3.5–5.5)
Alkaline Phosphatase: 99 IU/L (ref 39–117)
BUN/Creatinine Ratio: 7 — ABNORMAL LOW (ref 9–23)
BUN: 5 mg/dL — ABNORMAL LOW (ref 6–24)
Bilirubin Total: 0.4 mg/dL (ref 0.0–1.2)
CO2: 26 mmol/L (ref 20–29)
Calcium: 9.8 mg/dL (ref 8.7–10.2)
Chloride: 100 mmol/L (ref 96–106)
Creatinine, Ser: 0.73 mg/dL (ref 0.57–1.00)
GFR calc Af Amer: 116 mL/min/{1.73_m2} (ref >59)
GFR calc non Af Amer: 100 mL/min/{1.73_m2} (ref >59)
Globulin, Total: 2.8 g/dL (ref 1.5–4.5)
Glucose: 167 mg/dL — ABNORMAL HIGH (ref 65–99)
Potassium: 4.3 mmol/L (ref 3.5–5.2)
Sodium: 141 mmol/L (ref 134–144)
Total Protein: 7 g/dL (ref 6.0–8.5)

## 2018-01-17 LAB — LIPID PANEL
Chol/HDL Ratio: 6.9 ratio — ABNORMAL HIGH (ref 0.0–4.4)
Cholesterol, Total: 201 mg/dL — ABNORMAL HIGH (ref 100–199)
HDL: 29 mg/dL — ABNORMAL LOW (ref >39)
LDL Calculated: 144 mg/dL — ABNORMAL HIGH (ref 0–99)
Triglycerides: 139 mg/dL (ref 0–149)
VLDL Cholesterol Cal: 28 mg/dL (ref 5–40)

## 2018-01-17 LAB — HEMOGLOBIN A1C
Est. average glucose Bld gHb Est-mCnc: 272 mg/dL
Hgb A1c MFr Bld: 11.1 % — ABNORMAL HIGH (ref 4.8–5.6)

## 2018-01-17 LAB — TSH: TSH: 2.2 u[IU]/mL (ref 0.450–4.500)

## 2018-01-20 ENCOUNTER — Other Ambulatory Visit: Payer: Self-pay | Admitting: Family Medicine

## 2018-01-20 DIAGNOSIS — E1169 Type 2 diabetes mellitus with other specified complication: Secondary | ICD-10-CM

## 2018-01-20 DIAGNOSIS — E785 Hyperlipidemia, unspecified: Principal | ICD-10-CM

## 2018-01-21 ENCOUNTER — Other Ambulatory Visit: Payer: Self-pay

## 2018-01-21 DIAGNOSIS — E119 Type 2 diabetes mellitus without complications: Secondary | ICD-10-CM

## 2018-01-22 LAB — MICROALBUMIN / CREATININE URINE RATIO
Creatinine, Urine: 197.8 mg/dL
Microalb/Creat Ratio: 9.5 mg/g creat (ref 0.0–30.0)
Microalbumin, Urine: 18.8 ug/mL

## 2018-01-23 ENCOUNTER — Ambulatory Visit
Admission: RE | Admit: 2018-01-23 | Discharge: 2018-01-23 | Disposition: A | Discharge status: Home / Self Care | Payer: Self-pay | Source: Ambulatory Visit | Attending: Adult Health Nurse Practitioner | Admitting: Adult Health Nurse Practitioner

## 2018-01-23 DIAGNOSIS — M4185 Other forms of scoliosis, thoracolumbar region: Secondary | ICD-10-CM | POA: Insufficient documentation

## 2018-01-23 DIAGNOSIS — G8929 Other chronic pain: Secondary | ICD-10-CM | POA: Insufficient documentation

## 2018-01-23 DIAGNOSIS — N200 Calculus of kidney: Secondary | ICD-10-CM | POA: Insufficient documentation

## 2018-01-23 DIAGNOSIS — M545 Low back pain, unspecified: Secondary | ICD-10-CM

## 2018-01-27 MED ORDER — ATORVASTATIN CALCIUM 10 MG PO TABS: 10.0000 mg | ORAL_TABLET | Freq: Every day | ORAL | 11 refills | Status: DC

## 2018-01-30 ENCOUNTER — Ambulatory Visit: Payer: Self-pay

## 2018-02-03 ENCOUNTER — Other Ambulatory Visit: Payer: Self-pay | Admitting: Internal Medicine

## 2018-02-03 ENCOUNTER — Ambulatory Visit: Payer: Self-pay | Admitting: Pharmacist

## 2018-02-03 ENCOUNTER — Other Ambulatory Visit: Payer: Self-pay | Admitting: Adult Health Nurse Practitioner

## 2018-02-03 DIAGNOSIS — Z79899 Other long term (current) drug therapy: Secondary | ICD-10-CM

## 2018-02-03 DIAGNOSIS — E119 Type 2 diabetes mellitus without complications: Secondary | ICD-10-CM

## 2018-02-03 NOTE — Progress Notes (Signed)
Medication Management Clinic Visit Note  Patient: [name] MRN: [id number] Date of Birth: February 15, 1976 PCP: Kallie Locks, FNP   [name] 42 y.o. female presents for a Medication Therapy Managment visit today.  Patient Information   Past Medical History:  Diagnosis Date  . Diabetes mellitus without complication (HCC)   . GERD (gastroesophageal reflux disease)   . Headache   . Hypertension   . Neuromuscular disorder [facility])       Past Surgical History:  Procedure Laterality Date  . CHOLECYSTECTOMY  1998     Family History  Problem Relation Age of Onset  . Kidney failure Father   . Pancreatic cancer Maternal Grandmother     Outpatient Encounter Medications as of 02/03/2018  Medication Sig  . glipiZIDE (GLUCOTROL) 10 MG tablet TAKE ONE TABLET BY MOUTH 2 TIMES A DAY BEFORE A MEAL  . liraglutide 18 MG/3ML SOPN Titration. Start at 1.2 mg and increase to 1.5 and finally to 1.8 if can tolerate. (Patient taking differently: 1.8 mg. Titration. Start at 1.2 mg and increase to 1.5 and finally to 1.8 if can tolerate.)  . atorvastatin (LIPITOR) 10 MG tablet Take 1 tablet (10 mg total) by mouth daily. (Patient not taking: Reported on 02/03/2018)  . meloxicam (MOBIC) 15 MG tablet Take 1 tablet (15 mg total) by mouth daily. (Patient not taking: Reported on 02/03/2018)  . metFORMIN (GLUCOPHAGE-XR) 500 MG 24 hr tablet Take 2 tablets (1,000 mg total) by mouth 2 (two) times daily before a meal. (Patient not taking: Reported on 02/03/2018)  . topiramate (TOPAMAX) 25 MG capsule Take 1 capsule (25 mg total) by mouth daily. (Patient not taking: Reported on 02/03/2018)   No facility-administered encounter medications on file as of 02/03/2018.         Lifestyle Diet: Breakfast: oatmeal, eggs, ham, cream of wheat Lunch:sandwiches Dinner:baked chicken, veggies Drinks:OJ, water, occasionally tea, very rarely lemonade            Social History   Substance and Sexual  Activity  Alcohol Use No      Social History   Tobacco Use  Smoking Status Never Smoker  Smokeless Tobacco Never Used      Health Maintenance  Topic Date Due  . PNEUMOCOCCAL POLYSACCHARIDE VACCINE AGE 44-64 HIGH RISK  08/22/1975  . FOOT EXAM  08/22/1983  . HIV Screening  08/21/1988  . TETANUS/TDAP  08/21/1992  . PAP SMEAR  08/22/1994  . INFLUENZA VACCINE  12/12/2017  . OPHTHALMOLOGY EXAM  07/04/2018  . HEMOGLOBIN A1C  07/17/2018  . URINE MICROALBUMIN  01/22/2019     Assessment and Plan: Diabetes (Victoza 1.8 mg in afternoon, Glipizide 10 mg BID before meals): Patient's last A1c was 11.1. Discussed average over past 3 months is ~270 mg/dL. Patient checks sugars twice daily; once before breakfast and once in the evenings, 2 hours after meals. Usually runs ~270-300. Encouraged patient to decrease carb intake as well as begin exercising ~20 minutes 3x/week. Patient endorses she was taking metformin prior, but was stopped at last MD visit due to severe diarrhea. She was taking her AM dose without eating. Endorsed to take metformin with meals in the future if medication is re-initiated.  Migraines: Patient no longer takes Topiramate for migraines as she stated it worsened her migraines. She has been taking Ibuprofen and Tylenol almost daily for migraines (unsure of dose). Patient has been having blurry vision intermittently. Will reach out to [facility] about these issues.   [name], PharmD Pharmacy  Resident  02/03/2018 3:53 PM

## 2018-02-04 ENCOUNTER — Telehealth: Payer: Self-pay

## 2018-02-04 NOTE — Telephone Encounter (Signed)
-----   Message from Mauri ReadingSavanna M Zamora, [facility] sent at 02/03/2018  3:59 PM EDT ----- Patient is taking Ibuprofen/Tylenol daily for migraines, and was unable to tolerate Topiramate. She has also been having bouts of blurred vision. [facility]  Thank you! Maxcine HamSavanna Zamora

## 2018-02-11 ENCOUNTER — Ambulatory Visit: Payer: Self-pay | Admitting: Urology

## 2018-02-11 VITALS — BP 123/71 | HR 79 | Temp 98.2°F | Ht 67.0 in | Wt 260.3 lb

## 2018-02-11 DIAGNOSIS — R51 Headache: Principal | ICD-10-CM

## 2018-02-11 DIAGNOSIS — R519 Headache, unspecified: Secondary | ICD-10-CM

## 2018-02-11 NOTE — Progress Notes (Signed)
Subjective:    Patient ID: Margart Zemanek, female    DOB: 06-29-1975, 42 y.o.   MRN: [id number]  HPI Topamax made the migraines worse - she tried three doses of Topamax with no improvement  Ibuprofen not effective   She is having right visual changes that is not associated with the migraines .  There is no pain in the eye.  Vision get fuzzy.  No blind spots.  Had an eye exam last month and no abnormalities were found.    DM - preprandial sugars are 180 and postprandial sugars are 250.      Patient Active Problem List   Diagnosis Date Noted  . Headache 01/16/2018  . Back pain 01/16/2018  . Obesity 05/01/2016  . Depression 05/01/2016  . Metrorrhagia 11/10/2015  . Diabetes (HCC) 03/10/2015  . Anemia 03/10/2015  . Edema 03/10/2015   Allergies as of 02/11/2018      Reactions   Penicillins Hives   Topiramate Er    Worsened migraines, N/V      Medication List        Accurate as of 02/11/18  7:15 PM. Always use your most recent med list.          atorvastatin 10 MG tablet Commonly known as:  LIPITOR Take 1 tablet (10 mg total) by mouth daily.   glipiZIDE 10 MG tablet Commonly known as:  GLUCOTROL TAKE ONE TABLET BY MOUTH 2 TIMES A DAY BEFORE A MEAL   liraglutide 18 MG/3ML Sopn Commonly known as:  VICTOZA Titration. Start at 1.2 mg and increase to 1.5 and finally to 1.8 if can tolerate.   meloxicam 15 MG tablet Commonly known as:  MOBIC Take 1 tablet (15 mg total) by mouth daily.   metFORMIN 500 MG 24 hr tablet Commonly known as:  GLUCOPHAGE-XR Take 2 tablets (1,000 mg total) by mouth 2 (two) times daily before a meal.   topiramate 25 MG capsule Commonly known as:  TOPAMAX Take 1 capsule (25 mg total) by mouth daily.        Review of Systems  Musculoskeletal: Positive for back pain.  Neurological: Positive for headaches.  All other systems reviewed and are negative.      Objective:   Physical Exam  Constitutional: She is oriented to person,  place, and time. She appears well-developed and well-nourished.  Pulmonary/Chest: Effort normal.  Musculoskeletal: Normal range of motion.       Back:  Bilateral pedal edema, R > L  Neurological: She is alert and oriented to person, place, and time.  Skin: Skin is warm and dry.  Psychiatric: She has a normal mood and affect. Her behavior is normal. Judgment and thought content normal.  Vitals reviewed.   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 This can be a source of chronic low back pain. Stable mild scoliosis. Appointment with Dr. Justice Rocher  DM:  Taking glipizide and Victoza - does not want to  start metformin Follow up in three months  Will need labs

## 2018-04-16 ENCOUNTER — Other Ambulatory Visit: Payer: Self-pay

## 2018-04-16 ENCOUNTER — Emergency Department
Admission: EM | Admit: 2018-04-16 | Discharge: 2018-04-16 | Disposition: A | Discharge status: Home / Self Care | Payer: Self-pay | Attending: Emergency Medicine | Admitting: Emergency Medicine

## 2018-04-16 DIAGNOSIS — Z79899 Other long term (current) drug therapy: Secondary | ICD-10-CM | POA: Insufficient documentation

## 2018-04-16 DIAGNOSIS — S0502XA Injury of conjunctiva and corneal abrasion without foreign body, left eye, initial encounter: Secondary | ICD-10-CM | POA: Insufficient documentation

## 2018-04-16 DIAGNOSIS — Y939 Activity, unspecified: Secondary | ICD-10-CM | POA: Insufficient documentation

## 2018-04-16 DIAGNOSIS — E119 Type 2 diabetes mellitus without complications: Secondary | ICD-10-CM | POA: Insufficient documentation

## 2018-04-16 DIAGNOSIS — Y929 Unspecified place or not applicable: Secondary | ICD-10-CM | POA: Insufficient documentation

## 2018-04-16 DIAGNOSIS — Y999 Unspecified external cause status: Secondary | ICD-10-CM | POA: Insufficient documentation

## 2018-04-16 DIAGNOSIS — I10 Essential (primary) hypertension: Secondary | ICD-10-CM | POA: Insufficient documentation

## 2018-04-16 DIAGNOSIS — X58XXXA Exposure to other specified factors, initial encounter: Secondary | ICD-10-CM | POA: Insufficient documentation

## 2018-04-16 MED ORDER — TETRACAINE HCL 0.5 % OP SOLN
2.0000 [drp] | Freq: Once | OPHTHALMIC | Status: AC
  Administered 2018-04-16: 2 [drp] via OPHTHALMIC
  Filled 2018-04-16: qty 4

## 2018-04-16 MED ORDER — POLYMYXIN B-TRIMETHOPRIM 10000-0.1 UNIT/ML-% OP SOLN: 2.0000 [drp] | Freq: Four times a day (QID) | OPHTHALMIC | 0 refills | Status: DC

## 2018-04-16 MED ORDER — FLUORESCEIN SODIUM 1 MG OP STRP
1.0000 | ORAL_STRIP | Freq: Once | OPHTHALMIC | Status: AC
  Administered 2018-04-16: 1 via OPHTHALMIC
  Filled 2018-04-16: qty 1

## 2018-04-16 MED ORDER — KETOROLAC TROMETHAMINE 0.5 % OP SOLN: 1.0000 [drp] | Freq: Four times a day (QID) | OPHTHALMIC | 0 refills | Status: DC

## 2018-04-16 NOTE — ED Provider Notes (Signed)
[facility] Emergency Department Provider Note  ____________________________________________  Time seen: Approximately 3:46 PM  I have reviewed the triage vital signs and the nursing notes.   HISTORY  Chief Complaint Eye Pain    HPI [name] is a 42 y.o. female who presents the emergency department complaining of left eye pain, irritation, excessive tearing.  Patient reports that over the past 2 days she has had increased pain to the left eye with excessive tearing.  No purulent drainage.  Patient states that initially it felt like "something was in my eye "but now it feels more like a "jumping" sensation.  Patient is supposed to wear glasses but does not do so on a regular basis.  No contacts.  Patient denies any trauma to the eye.  Initially, patient states that she had some mild blurry vision in the left eye but at this time she denies any visual changes.  No retro-orbital pain.  No edema surrounding the left eye.  No other complaints at this time.  No medications for his complaint prior to arrival.    Past Medical History:  Diagnosis Date  . Diabetes mellitus without complication (HCC)   . GERD (gastroesophageal reflux disease)   . Headache   . Hypertension   . Neuromuscular disorder [facility](HCC)     Patient Active Problem List   Diagnosis Date Noted  . Headache 01/16/2018  . Back pain 01/16/2018  . Obesity 05/01/2016  . Depression 05/01/2016  . Metrorrhagia 11/10/2015  . Diabetes (HCC) 03/10/2015  . Anemia 03/10/2015  . Edema 03/10/2015    Past Surgical History:  Procedure Laterality Date  . CHOLECYSTECTOMY  1998    Prior to Admission medications   Medication Sig Start Date End Date Taking? Authorizing Provider  atorvastatin (LIPITOR) 10 MG tablet Take 1 tablet (10 mg total) by mouth daily. Patient not taking: Reported on 02/03/2018 01/27/18 01/27/19  Kallie LocksStroud, Natalie M, FNP  glipiZIDE (GLUCOTROL) 10 MG tablet TAKE ONE TABLET BY MOUTH 2  TIMES A DAY BEFORE A MEAL 02/04/18   Doles-Johnson, Teah, NP  ketorolac (ACULAR) 0.5 % ophthalmic solution Place 1 drop into the left eye 4 (four) times daily. 04/16/18   Clete Kuch, Christiane HaJonathan D, PA-C  liraglutide 18 MG/3ML SOPN Titration. Start at 1.2 mg and increase to 1.5 and finally to 1.8 if can tolerate. Patient taking differently: 1.8 mg. Titration. Start at 1.2 mg and increase to 1.5 and finally to 1.8 if can tolerate. 05/01/16   Nehemiah SettleLargay, Joseph F, PA  meloxicam (MOBIC) 15 MG tablet Take 1 tablet (15 mg total) by mouth daily. Patient not taking: Reported on 02/03/2018 06/13/17   Kallie LocksStroud, Natalie M, FNP  metFORMIN (GLUCOPHAGE-XR) 500 MG 24 hr tablet Take 2 tablets (1,000 mg total) by mouth 2 (two) times daily before a meal. Patient not taking: Reported on 02/03/2018 06/13/17   Kallie LocksStroud, Natalie M, FNP  topiramate (TOPAMAX) 25 MG capsule Take 1 capsule (25 mg total) by mouth daily. Patient not taking: Reported on 02/03/2018 01/16/18   Doles-Johnson, Teah, NP  trimethoprim-polymyxin b (POLYTRIM) ophthalmic solution Place 2 drops into the left eye every 6 (six) hours. 04/16/18   Denese Mentink, Delorise RoyalsJonathan D, PA-C    Allergies Penicillins and Topiramate er  Family History  Problem Relation Age of Onset  . Kidney failure Father   . Pancreatic cancer Maternal Grandmother     Social History Social History   Tobacco Use  . Smoking status: Never Smoker  . Smokeless tobacco: Never Used  Substance  Use Topics  . Alcohol use: No  . Drug use: No     Review of Systems  Constitutional: No fever/chills Eyes: Initially blurred vision, denies any visual changes at this time.  Increased tearing.  Initially foreign body sensation, now a "jumping" sensation in the eye.  ENT: No upper respiratory complaints. Cardiovascular: no chest pain. Respiratory: no cough. No SOB. Gastrointestinal: No abdominal pain.  No nausea, no vomiting.   Musculoskeletal: Negative for musculoskeletal pain. Skin: Negative for rash,  abrasions, lacerations, ecchymosis. Neurological: Negative for headaches, focal weakness or numbness. 10-point ROS otherwise negative.  ____________________________________________   PHYSICAL EXAM:  VITAL SIGNS: ED Triage Vitals  Enc Vitals Group     BP 04/16/18 1459 121/60     Pulse Rate 04/16/18 1459 91     Resp 04/16/18 1459 16     Temp 04/16/18 1459 98.5 F (36.9 C)     Temp Source 04/16/18 1459 Oral     SpO2 04/16/18 1459 98 %     Weight 04/16/18 1500 250 lb (113.4 kg)     Height 04/16/18 1500 5\' 7"  (1.702 m)     Head Circumference --      Peak Flow --      Pain Score 04/16/18 1459 7     Pain Loc --      Pain Edu? --      Excl. in GC? --      Constitutional: Alert and oriented. Well appearing and in no acute distress. Eyes: Conjunctiva with minimal erythema to the 5 o'clock position left eye.  No other visible abnormality to conjunctiva. PERRL. EOMI. funduscopic exam reveals red reflex bilaterally.  Vasculature and optic disc is unremarkable bilaterally.  Eye is anesthetized using tetracaine drops.  Fluorescein staining applied with area of uptake in the 5 o'clock position just inferior to the iris.  No other visible abnormality.  No foreign body. Head: Atraumatic. ENT:      Ears:       Nose: No congestion/rhinnorhea.      Mouth/Throat: Mucous membranes are moist.  Neck: No stridor.    Cardiovascular: Normal rate, regular rhythm. Normal S1 and S2.  Good peripheral circulation. Respiratory: Normal respiratory effort without tachypnea or retractions. Lungs CTAB. Good air entry to the bases with no decreased or absent breath sounds. Musculoskeletal: Full range of motion to all extremities. No gross deformities appreciated. Neurologic:  Normal speech and language. No gross focal neurologic deficits are appreciated.  Skin:  Skin is warm, dry and intact. No rash noted. Psychiatric: Mood and affect are normal. Speech and behavior are normal. Patient exhibits appropriate  insight and judgement.   ____________________________________________   LABS (all labs ordered are listed, but only abnormal results are displayed)  Labs Reviewed - No data to display ____________________________________________  EKG   ____________________________________________  RADIOLOGY   No results found.  ____________________________________________    PROCEDURES  Procedure(s) performed:    Procedures    Medications  fluorescein ophthalmic strip 1 strip (1 strip Left Eye Given by Other 04/16/18 1602)  tetracaine (PONTOCAINE) 0.5 % ophthalmic solution 2 drop (2 drops Both Eyes Given by Other 04/16/18 1602)     ____________________________________________   INITIAL IMPRESSION / ASSESSMENT AND PLAN / ED COURSE  Pertinent labs & imaging results that were available during my care of the patient were reviewed by me and considered in my medical decision making (see chart for details).  Review of the [place] CSRS was performed in accordance of the [facility] prior  to dispensing any controlled drugs.      Patient's diagnosis is consistent with corneal abrasion.  Patient presents the emergency department with left eye pain, increased tearing.  On exam, patient has findings consistent with corneal abrasion.  Differential included bacterial/viral/allergic conjunctivitis, foreign body, corneal abrasion, dendritic ulcer, glaucoma.  No further work-up at this time.. Patient will be discharged home with prescriptions for Polytrim eyedrops and Acular. Patient is to follow up with ophthalmology as needed or otherwise directed. Patient is given ED precautions to return to the ED for any worsening or new symptoms.     ____________________________________________  FINAL CLINICAL IMPRESSION(S) / ED DIAGNOSES  Final diagnoses:  Abrasion of left cornea, initial encounter      NEW MEDICATIONS STARTED DURING THIS VISIT:  ED Discharge Orders         Ordered     trimethoprim-polymyxin b (POLYTRIM) ophthalmic solution  Every 6 hours     04/16/18 1619    ketorolac (ACULAR) 0.5 % ophthalmic solution  4 times daily     04/16/18 1619              This chart was dictated using voice recognition software/Dragon. Despite best efforts to proofread, errors can occur which can change the meaning. Any change was purely unintentional.    Racheal Patches, PA-C 04/16/18 1619    Don Perking, [place], MD 04/17/18 [id number]

## 2018-04-16 NOTE — ED Notes (Signed)
See triage note  Presents with some pain to left eye   States she developed some pain and drainage yesterday  Prior to this she had a film over eye  Left eye is irritated

## 2018-04-16 NOTE — ED Triage Notes (Addendum)
Pt c/o left eye pain that started last pm - she reports that for a few days she has been having difficulty with focus in that eye and yesterday the eye started with "cutting/wiggling pain/throbbing" - reports drainage from eye

## 2018-04-17 ENCOUNTER — Other Ambulatory Visit: Payer: Self-pay

## 2018-04-17 ENCOUNTER — Telehealth: Payer: Self-pay

## 2018-04-17 ENCOUNTER — Ambulatory Visit: Payer: Self-pay | Admitting: Adult Health

## 2018-04-17 ENCOUNTER — Encounter: Payer: Self-pay | Admitting: Adult Health

## 2018-04-17 VITALS — BP 105/64 | HR 73 | Temp 97.9°F | Ht 66.5 in | Wt 259.0 lb

## 2018-04-17 DIAGNOSIS — E785 Hyperlipidemia, unspecified: Secondary | ICD-10-CM

## 2018-04-17 DIAGNOSIS — E1140 Type 2 diabetes mellitus with diabetic neuropathy, unspecified: Secondary | ICD-10-CM

## 2018-04-17 DIAGNOSIS — E1169 Type 2 diabetes mellitus with other specified complication: Secondary | ICD-10-CM

## 2018-04-17 DIAGNOSIS — E1165 Type 2 diabetes mellitus with hyperglycemia: Secondary | ICD-10-CM

## 2018-04-17 DIAGNOSIS — E118 Type 2 diabetes mellitus with unspecified complications: Secondary | ICD-10-CM

## 2018-04-17 MED ORDER — GABAPENTIN 100 MG PO CAPS: 100.0000 mg | ORAL_CAPSULE | Freq: Every day | ORAL | 2 refills | Status: DC

## 2018-04-17 MED ORDER — ATORVASTATIN CALCIUM 10 MG PO TABS: 10.0000 mg | ORAL_TABLET | Freq: Every day | ORAL | 2 refills | Status: DC

## 2018-04-17 NOTE — Progress Notes (Signed)
Patient: [name] Female    DOB: [birth year]   42 y.o.   MRN: [id number] Visit Date: 04/17/2018  Today's Provider: Deforest Hoyles, NP   Chief Complaint  Patient presents with  . Follow-up    L hand numbness, R arm pain    Subjective:    HPI This is a 42 y/o female who presents for evaluation right arm pain x 1 month and left hand pain for for a few days. She describes right arm pain as starting at the shoulder and vibrating down her right hand. She reports falling downstairs and trying to catch herself with her right arm bout 2-3 years ago. She rates the pain at night as 8/10 and now 0/10. Pain occurs mostly when she lies on her right arm or when she moves it. She can't think of any triggers besides the above and reports moderate relieve with position changes. She hasn't taken any medications. Left hand pain feels like a numbness and comes on at night as well. Worse when she's lying on her left side. Takes occasional ibuprofen for migraines but it doesn't seem to help with the hand pain. She had an x-ray of the LS on September 2019 which was unremarkable. She hasn't had any imaging of her upper extremities.  She reports monitoring her blood glucose level at home with levels between 260 and 265 mg/dl. No blood glucose logs. She is on glipizide and Victoza. She denies any medications side effects.    Allergies  Allergen Reactions  . Penicillins Hives  . Topiramate Er     Worsened migraines, N/V   Previous Medications   ATORVASTATIN (LIPITOR) 10 MG TABLET    Take 1 tablet (10 mg total) by mouth daily.   GLIPIZIDE (GLUCOTROL) 10 MG TABLET    TAKE ONE TABLET BY MOUTH 2 TIMES A DAY BEFORE A MEAL   KETOROLAC (ACULAR) 0.5 % OPHTHALMIC SOLUTION    Place 1 drop into the left eye 4 (four) times daily.   LIRAGLUTIDE 18 MG/3ML SOPN    Titration. Start at 1.2 mg and increase to 1.5 and finally to 1.8 if can tolerate.   MELOXICAM (MOBIC) 15 MG TABLET    Take 1 tablet (15 mg total)  by mouth daily.   METFORMIN (GLUCOPHAGE-XR) 500 MG 24 HR TABLET    Take 2 tablets (1,000 mg total) by mouth 2 (two) times daily before a meal.   TOPIRAMATE (TOPAMAX) 25 MG CAPSULE    Take 1 capsule (25 mg total) by mouth daily.   TRIMETHOPRIM-POLYMYXIN B (POLYTRIM) OPHTHALMIC SOLUTION    Place 2 drops into the left eye every 6 (six) hours.    Review of Systems  Constitutional: Negative for appetite change, fatigue and unexpected weight change.  Eyes: Negative.   Respiratory: Negative.   Cardiovascular: Negative.   Gastrointestinal: Negative.   Endocrine: Negative.   Musculoskeletal: Positive for joint swelling (Right shoulder) and myalgias (Right and left).  Neurological: Positive for weakness (Right) and numbness (Left hand).  Psychiatric/Behavioral: Negative.     Social History   Tobacco Use  . Smoking status: Never Smoker  . Smokeless tobacco: Never Used  Substance Use Topics  . Alcohol use: No   Objective:   BP 105/64 (BP Location: Left Arm, Patient Position: Sitting)   Pulse 73   Temp 97.9 F (36.6 C)   Ht 5' 6.5" (1.689 m)   Wt 259 lb (117.5 kg)   SpO2 99%   BMI 41.18 kg/m   Physical Exam  Constitutional: She is oriented to person, place, and time. She appears well-developed and well-nourished.  Eyes: Pupils are equal, round, and reactive to light.  Neck: Normal range of motion.  Cardiovascular: Normal rate, regular rhythm, normal heart sounds and intact distal pulses.  Pulmonary/Chest: Effort normal and breath sounds normal.  Abdominal: Soft. Bowel sounds are normal.  Musculoskeletal: She exhibits tenderness (Pain with flexion and extension of the right shoulder).  Neurological: She is alert and oriented to person, place, and time. A sensory deficit is present. No cranial nerve deficit.  Skin: Skin is warm and dry. Capillary refill takes less than 2 seconds.  Nursing note and vitals reviewed.  Assessment & Plan:   1. Hyperlipidemia associated with type 2  diabetes mellitus ([place]) Continue current medications.  Will obtain lipid panel - atorvastatin (LIPITOR) 10 MG tablet; Take 1 tablet (10 mg total) by mouth daily.  Dispense: 90 tablet; Refill: 2 - CBC w/Diff - Comp Met (CMET) - Lipid Profile - HgB A1c  2. Type 2 diabetes mellitus with complication, without long-term current use of insulin ([place]) With hyperlipidemia and neuropathy.  Will obtain repeat hemoglobin A1c.  Continue glipizide 10 mg daily   3. Type 2 diabetes mellitus with diabetic neuropathy, without long-term current use of insulin (HCC) Heart and symptoms are most consistent with diabetic neuropathy.  Start patient on vitamin D and vitamin B12 - Vitamin D 1,25 dihydroxy - Vitamin B1  4. Type 2 diabetes mellitus with hyperglycemia, without long-term current use of insulin (HCC) We will refer to endocrinology.  Deforest Hoyles, NP   [facility]

## 2018-04-18 LAB — SPECIMEN STATUS REPORT

## 2018-04-19 ENCOUNTER — Encounter: Payer: Self-pay | Admitting: Adult Health

## 2018-04-19 MED ORDER — VITAMIN B 12 500 MCG PO TABS: 1000.0000 ug | ORAL_TABLET | Freq: Every day | ORAL | 4 refills | Status: DC

## 2018-04-19 MED ORDER — VITAMIN D (ERGOCALCIFEROL) 1.25 MG (50000 UNIT) PO CAPS: 50000.0000 [IU] | ORAL_CAPSULE | ORAL | 3 refills | Status: DC

## 2018-04-22 LAB — CBC WITH DIFFERENTIAL/PLATELET
Basophils Absolute: 0.1 10*3/uL (ref 0.0–0.2)
Basos: 1 %
EOS (ABSOLUTE): 0.2 10*3/uL (ref 0.0–0.4)
Eos: 2 %
Hematocrit: 34 % (ref 34.0–46.6)
Hemoglobin: 10.3 g/dL — ABNORMAL LOW (ref 11.1–15.9)
Immature Grans (Abs): 0 10*3/uL (ref 0.0–0.1)
Immature Granulocytes: 0 %
Lymphocytes Absolute: 2.8 10*3/uL (ref 0.7–3.1)
Lymphs: 25 %
MCH: 22.3 pg — ABNORMAL LOW (ref 26.6–33.0)
MCHC: 30.3 g/dL — ABNORMAL LOW (ref 31.5–35.7)
MCV: 74 fL — ABNORMAL LOW (ref 79–97)
Monocytes Absolute: 0.7 10*3/uL (ref 0.1–0.9)
Monocytes: 6 %
Neutrophils Absolute: 7.4 10*3/uL — ABNORMAL HIGH (ref 1.4–7.0)
Neutrophils: 66 %
Platelets: 429 10*3/uL (ref 150–450)
RBC: 4.61 x10E6/uL (ref 3.77–5.28)
RDW: 14.4 % (ref 12.3–15.4)
WBC: 11.1 10*3/uL — ABNORMAL HIGH (ref 3.4–10.8)

## 2018-04-22 LAB — COMPREHENSIVE METABOLIC PANEL
ALT: 10 IU/L (ref 0–32)
AST: 10 IU/L (ref 0–40)
Albumin/Globulin Ratio: 1.3 (ref 1.2–2.2)
Albumin: 4 g/dL (ref 3.5–5.5)
Alkaline Phosphatase: 129 IU/L — ABNORMAL HIGH (ref 39–117)
BUN/Creatinine Ratio: 13 (ref 9–23)
BUN: 10 mg/dL (ref 6–24)
Bilirubin Total: 0.3 mg/dL (ref 0.0–1.2)
CO2: 24 mmol/L (ref 20–29)
Calcium: 9.2 mg/dL (ref 8.7–10.2)
Chloride: 95 mmol/L — ABNORMAL LOW (ref 96–106)
Creatinine, Ser: 0.8 mg/dL (ref 0.57–1.00)
GFR calc Af Amer: 104 mL/min/{1.73_m2} (ref >59)
GFR calc non Af Amer: 90 mL/min/{1.73_m2} (ref >59)
Globulin, Total: 3.1 g/dL (ref 1.5–4.5)
Glucose: 374 mg/dL — ABNORMAL HIGH (ref 65–99)
Potassium: 4.4 mmol/L (ref 3.5–5.2)
Sodium: 134 mmol/L (ref 134–144)
Total Protein: 7.1 g/dL (ref 6.0–8.5)

## 2018-04-22 LAB — LIPID PANEL
Chol/HDL Ratio: 6.1 ratio — ABNORMAL HIGH (ref 0.0–4.4)
Cholesterol, Total: 164 mg/dL (ref 100–199)
HDL: 27 mg/dL — ABNORMAL LOW (ref >39)
LDL Calculated: 106 mg/dL — ABNORMAL HIGH (ref 0–99)
Triglycerides: 154 mg/dL — ABNORMAL HIGH (ref 0–149)
VLDL Cholesterol Cal: 31 mg/dL (ref 5–40)

## 2018-04-22 LAB — VITAMIN D 1,25 DIHYDROXY
Vitamin D 1, 25 (OH)2 Total: 55 pg/mL
Vitamin D2 1, 25 (OH)2: <10 pg/mL
Vitamin D3 1, 25 (OH)2: 55 pg/mL

## 2018-04-22 LAB — HEMOGLOBIN A1C
Est. average glucose Bld gHb Est-mCnc: 240 mg/dL
Hgb A1c MFr Bld: 10 % — ABNORMAL HIGH (ref 4.8–5.6)

## 2018-04-22 LAB — VITAMIN B1

## 2018-04-22 NOTE — Telephone Encounter (Signed)
Opened in error

## 2018-05-15 ENCOUNTER — Other Ambulatory Visit: Payer: Self-pay

## 2018-05-20 ENCOUNTER — Ambulatory Visit: Payer: Self-pay | Admitting: Adult Health Nurse Practitioner

## 2018-05-20 VITALS — BP 116/60 | HR 74 | Temp 98.0°F | Ht 67.2 in | Wt 259.5 lb

## 2018-05-20 DIAGNOSIS — Z794 Long term (current) use of insulin: Principal | ICD-10-CM

## 2018-05-20 DIAGNOSIS — E1142 Type 2 diabetes mellitus with diabetic polyneuropathy: Secondary | ICD-10-CM

## 2018-05-20 MED ORDER — INSULIN GLARGINE 100 UNIT/ML ~~LOC~~ SOLN: 10.0000 [IU] | Freq: Every day | SUBCUTANEOUS | 11 refills | Status: DC

## 2018-05-20 NOTE — Progress Notes (Signed)
Patient: Hannah KirksRadonna Nicole Clermont Female    DOB: Apr 05, 1976   43 y.o.   MRN: [id number] Visit Date: 05/20/2018  Today's Provider: Jacelyn Pieah Doles-Johnson, NP   Chief Complaint  Patient presents with  . Follow-up    high glucose reading(597 reading when it down later), right arm and hand pain, sharp pains in left side of chest   Subjective:    HPI  Pt states that she got a HIGH reading on her glucometer yesterday. She was asymptomatic.  Today her CBG was in the 300s.  Taking glipizide and Victoza as directed. Unable to tolerate Metformin due to GI disturbance.    States that she is having sharp left breast pain over the past few days.  Denies any abnormalities to breasts.  Lat mammogram was a few years ago. Doesn't remember exact date.   Was started on Vitamin B and D for neuropathy in Dec- she is unsure if it is really effective.       Allergies  Allergen Reactions  . Penicillins Hives  . Topiramate Er     Worsened migraines, N/V   Previous Medications   ATORVASTATIN (LIPITOR) 10 MG TABLET    Take 1 tablet (10 mg total) by mouth daily.   CYANOCOBALAMIN (VITAMIN B 12) 500 MCG TABS    Take 1,000 mcg by mouth daily.   GABAPENTIN (NEURONTIN) 100 MG CAPSULE    Take 1 capsule (100 mg total) by mouth at bedtime.   GLIPIZIDE (GLUCOTROL) 10 MG TABLET    TAKE ONE TABLET BY MOUTH 2 TIMES A DAY BEFORE A MEAL   KETOROLAC (ACULAR) 0.5 % OPHTHALMIC SOLUTION    Place 1 drop into the left eye 4 (four) times daily.   LIRAGLUTIDE 18 MG/3ML SOPN    Titration. Start at 1.2 mg and increase to 1.5 and finally to 1.8 if can tolerate.   TRIMETHOPRIM-POLYMYXIN B (POLYTRIM) OPHTHALMIC SOLUTION    Place 2 drops into the left eye every 6 (six) hours.   VITAMIN D, ERGOCALCIFEROL, (DRISDOL) 1.25 MG (50000 UT) CAPS CAPSULE    Take 1 capsule (50,000 Units total) by mouth every 7 (seven) days.    Review of Systems  All other systems reviewed and are negative.   Social History   Tobacco Use  . Smoking status:  Never Smoker  . Smokeless tobacco: Never Used  Substance Use Topics  . Alcohol use: No   Objective:   BP 116/60   Pulse 74   Temp 98 F (36.7 C)   Ht 5' 7.2" (1.707 m)   Wt 259 lb 8 oz (117.7 kg)   LMP 04/26/2018 (Approximate) Comment: skips months sometimes  BMI 40.40 kg/m   Physical Exam Vitals signs reviewed.  Constitutional:      Appearance: Normal appearance.  HENT:     Head: Normocephalic.  Cardiovascular:     Rate and Rhythm: Normal rate and regular rhythm.  Pulmonary:     Effort: Pulmonary effort is normal.     Breath sounds: Normal breath sounds.  Abdominal:     General: Bowel sounds are normal.     Palpations: Abdomen is soft.  Skin:    General: Skin is warm and dry.  Neurological:     Mental Status: She is alert and oriented to person, place, and time.         Assessment & Plan:      DM:  Not controlled. Last A1c was 10.  Encourage diabetic diet and exercise.  Continue current medication regimen with  the addition of Lantus 10 units at night.  CBGs fasting- bring log to next OV.  Continue gabapentin, vitamin B and D for neuropathy.   FU with Endo on 1.21.20.      Refer for a mammogram.  Discussed self breast exams.   Jacelyn Pi, NP   [facility]

## 2018-05-20 NOTE — Patient Instructions (Signed)
Insulin Glargine injection  What is this medicine?  INSULIN GLARGINE (IN su lin GLAR geen) is a human-made form of insulin. This drug lowers the amount of sugar in your blood. It is a long-acting insulin that is usually given once a day.  This medicine may be used for other purposes; ask your health care provider or pharmacist if you have questions.  COMMON BRAND NAME(S): BASAGLAR, Lantus, Lantus SoloStar, Toujeo Max SoloStar, Toujeo SoloStar  What should I tell my health care provider before I take this medicine?  They need to know if you have any of these conditions:  -episodes of low blood sugar  -eye disease, vision problems  -kidney disease  -liver disease  -an unusual or allergic reaction to insulin, metacresol, other medicines, foods, dyes, or preservatives  -pregnant or trying to get pregnant  -breast-feeding  How should I use this medicine?  This medicine is for injection under the skin. Use this medicine at the same time each day. Use exactly as directed. This insulin should never be mixed in the same syringe with other insulins before injection. Do not vigorously shake before use. You will be taught how to use this medicine and how to adjust doses for activities and illness. Do not use more insulin than prescribed.  Always check the appearance of your insulin before using it. This medicine should be clear and colorless like water. Do not use it if it is cloudy, thickened, colored, or has solid particles in it.  If you use an insulin pen, be sure to take off the outer needle cover before using the dose.  It is important that you put your used needles and syringes in a special sharps container. Do not put them in a trash can. If you do not have a sharps container, call your pharmacist or healthcare provider to get one.  Talk to your pediatrician regarding the use of this medicine in children. Special care may be needed.  Overdosage: If you think you have taken too much of this medicine contact a poison  control center or emergency room at once.  NOTE: This medicine is only for you. Do not share this medicine with others.  What if I miss a dose?  It is important not to miss a dose. Your health care professional or doctor should discuss a plan for missed doses with you. If you do miss a dose, follow their plan. Do not take double doses.  What may interact with this medicine?  -other medicines for diabetes  Many medications may cause changes in blood sugar, these include:  -alcohol containing beverages  -antiviral medicines for HIV or AIDS  -aspirin and aspirin-like drugs  -certain medicines for blood pressure, heart disease, irregular heart beat  -chromium  -diuretics  -female hormones, such as estrogens or progestins, birth control pills  -fenofibrate  -gemfibrozil  -isoniazid  -lanreotide  -female hormones or anabolic steroids  -MAOIs like Carbex, Eldepryl, Marplan, Nardil, and Parnate  -medicines for weight loss  -medicines for allergies, asthma, cold, or cough  -medicines for depression, anxiety, or psychotic disturbances  -niacin  -nicotine  -NSAIDs, medicines for pain and inflammation, like ibuprofen or naproxen  -octreotide  -pasireotide  -pentamidine  -phenytoin  -probenecid  -quinolone antibiotics such as ciprofloxacin, levofloxacin, ofloxacin  -some herbal dietary supplements  -steroid medicines such as prednisone or cortisone  -sulfamethoxazole; trimethoprim  -thyroid hormones  Some medications can hide the warning symptoms of low blood sugar (hypoglycemia). You may need to monitor your blood   sugar more closely if you are taking one of these medications. These include:  -beta-blockers, often used for high blood pressure or heart problems (examples include atenolol, metoprolol, propranolol)  -clonidine  -guanethidine  -reserpine  This list may not describe all possible interactions. Give your health care provider a list of all the medicines, herbs, non-prescription drugs, or dietary supplements you use. Also  tell them if you smoke, drink alcohol, or use illegal drugs. Some items may interact with your medicine.  What should I watch for while using this medicine?  Visit your health care professional or doctor for regular checks on your progress.  Do not drive, use machinery, or do anything that needs mental alertness until you know how this medicine affects you. Alcohol may interfere with the effect of this medicine. Avoid alcoholic drinks.  A test called the HbA1C (A1C) will be monitored. This is a simple blood test. It measures your blood sugar control over the last 2 to 3 months. You will receive this test every 3 to 6 months.  Learn how to check your blood sugar. Learn the symptoms of low and high blood sugar and how to manage them.  Always carry a quick-source of sugar with you in case you have symptoms of low blood sugar. Examples include hard sugar candy or glucose tablets. Make sure others know that you can choke if you eat or drink when you develop serious symptoms of low blood sugar, such as seizures or unconsciousness. They must get medical help at once.  Tell your doctor or health care professional if you have high blood sugar. You might need to change the dose of your medicine. If you are sick or exercising more than usual, you might need to change the dose of your medicine.  Do not skip meals. Ask your doctor or health care professional if you should avoid alcohol. Many nonprescription cough and cold products contain sugar or alcohol. These can affect blood sugar.  Make sure that you have the right kind of syringe for the type of insulin you use. Try not to change the brand and type of insulin or syringe unless your health care professional or doctor tells you to. Switching insulin brand or type can cause dangerously high or low blood sugar. Always keep an extra supply of insulin, syringes, and needles on hand. Use a syringe one time only. Throw away syringe and needle in a closed container to prevent  accidental needle sticks.  Insulin pens and cartridges should never be shared. Even if the needle is changed, sharing may result in passing of viruses like hepatitis or HIV.  Each time you get a new box of pen needles, check to see if they are the same type as the ones you were trained to use. If not, ask your health care professional to show you how to use this new type properly.  Wear a medical ID bracelet or chain, and carry a card that describes your disease and details of your medicine and dosage times.  What side effects may I notice from receiving this medicine?  Side effects that you should report to your doctor or health care professional as soon as possible:  -allergic reactions like skin rash, itching or hives, swelling of the face, lips, or tongue  -breathing problems  -signs and symptoms of high blood sugar such as dizziness, dry mouth, dry skin, fruity breath, nausea, stomach pain, increased hunger or thirst, increased urination  -signs and symptoms of low blood sugar such   as feeling anxious, confusion, dizziness, increased hunger, unusually weak or tired, sweating, shakiness, cold, irritable, headache, blurred vision, fast heartbeat, loss of consciousness  Side effects that usually do not require medical attention (report to your doctor or health care professional if they continue or are bothersome):  -increase or decrease in fatty tissue under the skin due to overuse of a particular injection site  -itching, burning, swelling, or rash at site where injected  This list may not describe all possible side effects. Call your doctor for medical advice about side effects. You may report side effects to FDA at 1-800-FDA-1088.  Where should I keep my medicine?  Keep out of the reach of children.  Store unopened vials in a refrigerator between 2 and 8 degrees C (36 and 46 degrees F). Do not freeze or use if the insulin has been frozen. Opened vials (vials currently in use) may be stored in the refrigerator or  at room temperature, at approximately 25 degrees C (77 degrees F) or cooler. Keeping your insulin at room temperature decreases the amount of pain during injection. Once opened, your insulin can be used for 28 days. After 28 days, the vial should be thrown away.  Store Lantus Solostar Pens or Basaglar KwikPens in a refrigerator between 2 and 8 degrees C (36 and 46 degrees F) or at room temperature below 30 degrees C (86 degrees F). Do not freeze or use if the insulin has been frozen. Once opened, the pens should be kept at room temperature. Do not store in the refrigerator once opened. Once opened, the insulin can be used for 28 days. After 28 days, the Lantus Solostar Pen or Basaglar KwikPen should be thrown away.  Store Toujeo and Toujeo Max Solostar Pens in a refrigerator between 2 and 8 degrees C (36 and 46 degrees F). Do not freeze or use if the insulin has been frozen. Once opened, the pens should be kept at room temperature below 30 degrees C (86 degrees F). Do not store in the refrigerator once opened. Once opened, the insulin can be used for 56 days. After 56 days, the Toujeo or Toujeo Max Solostar Pen should be thrown away.  Protect from light and excessive heat. Throw away any unused medicine after the expiration date or after the specified time for room temperature storage has passed.  NOTE: This sheet is a summary. It may not cover all possible information. If you have questions about this medicine, talk to your doctor, pharmacist, or health care provider.   2019 Elsevier/Gold Standard (2017-10-31 15:06:27)

## 2018-05-21 ENCOUNTER — Other Ambulatory Visit: Payer: Self-pay | Admitting: Adult Health Nurse Practitioner

## 2018-05-21 ENCOUNTER — Telehealth: Payer: Self-pay | Admitting: Pharmacist

## 2018-05-21 DIAGNOSIS — E119 Type 2 diabetes mellitus without complications: Secondary | ICD-10-CM

## 2018-05-21 NOTE — Telephone Encounter (Signed)
05/21/2018 11:39:29 AM - Lantus Vials new med  05/21/2018 Received a pharmacy printout for New Med-Lantus Vials Inject 10 units under the skin daily at bedtime #1 -- I have printed [employer] application will take provider portion to [facility] for Teah to sign, also we have samples for patient to pick up, I put patient portion of application in back for patient to sign & date, then return to me.Forde Radon

## 2018-06-02 ENCOUNTER — Telehealth: Payer: Self-pay | Admitting: Pharmacist

## 2018-06-02 NOTE — Telephone Encounter (Signed)
06/02/2018 4:32:08 PM - Lantus Vials   06/02/2018 Faxed [employer] application for Lantus Vials Inject 10 units daily at bedtime for enrollment.Forde Radon

## 2018-06-03 ENCOUNTER — Ambulatory Visit: Payer: Self-pay | Admitting: Internal Medicine

## 2018-06-03 VITALS — BP 122/62 | HR 78 | Temp 97.8°F | Ht 67.5 in | Wt 260.1 lb

## 2018-06-03 DIAGNOSIS — Z794 Long term (current) use of insulin: Principal | ICD-10-CM

## 2018-06-03 DIAGNOSIS — E1142 Type 2 diabetes mellitus with diabetic polyneuropathy: Secondary | ICD-10-CM

## 2018-06-03 LAB — GLUCOSE, POCT (MANUAL RESULT ENTRY): POC Glucose: 272 mg/dl — AB (ref 70–99)

## 2018-06-03 MED ORDER — INSULIN GLARGINE 100 UNIT/ML ~~LOC~~ SOLN: 20.0000 [IU] | Freq: Every day | SUBCUTANEOUS | 11 refills | Status: DC

## 2018-06-03 MED ORDER — ATORVASTATIN CALCIUM 40 MG PO TABS: 40.0000 mg | ORAL_TABLET | Freq: Every day | ORAL | 11 refills | Status: DC

## 2018-06-03 NOTE — Progress Notes (Signed)
Follow up Diabetes/ Endocrine [facility]     Patient ID: [name], female   DOB: May 31, 1975, 43 y.o.   MRN: [id number] Assessment:  [name] is a 43 y.o. female who is seen in follow up for Type 2 diabetes mellitus with diabetic polyneuropathy, with long-term current use of insulin (HCC) [E11.42, Z79.4] at the request of Kallie Locks, FNP.  Encounter Diagnoses 1. Type 2 diabetes mellitus with diabetic polyneuropathy, with long-term current use of insulin (HCC)     Assessment/Plan:    1. Diabetes mellitus: Patient tolerating medications well with no symptoms of hypoglycemia. Will need to address dietary changes in future visit. Foot exam due 05/2019. UMic due 01/2020. HbA1c due 07/2018.  Patient Instructions  Start taking 20 units of Lantus. Take your blood glucose readings in the morning. If the blood glucose reading in the morning is >150, then increase Lantus by 2 units. Continue increasing 2u of Lantus every 3 days if morning blood glucose >150. Stop at Lantus 40 units every day.   The dose of the LIPITOR will increase to 40mg  every day.    Orders Placed This Encounter  Procedures  . POCT Glucose (CBG)     Subjective:  Medications: Lantus 10 units at night. Glipizide 10mg  2x a day. Liraglutamide 1.8mg  injection 1x a day. Sleepy all the time - has been going on for a while. Denies feelings of passing out, shakes/chills. Checks bg at home 2x a day - two hours after taking medications (morning and evening). When feeling low in sugar, will check bg then. Blood sugar reading was high that it could not be read about 2 weeks ago. Mornings will be 250-260s. Exercise: occasionally, play with niece outside. Diet: eat whenever and whatever she can, eggs & toast, applesauce, graham crackers, oatmeal; will snack frequently with Lay Stax, Cheez-its, trail mix, Doritos; will eat dinner, but in the morning will eat whatever if she has an appetite, but otherwise would eat  snacks; beverages - tea, water, soda (non-diet), fruit juice. L fingers feel that they are puffed up and they are going to stiffen up, usually at night. Denies any tingling/numbness in hands or feet. Feet used to swell, but no longer does. Lives with mom at home, currently not work.      Review of Systems  Constitutional: Positive for fatigue. Negative for activity change, appetite change and chills.  Eyes: Negative for discharge.  Respiratory: Negative for chest tightness and shortness of breath.   Skin: Negative for color change and pallor.  Neurological: Negative for tremors, weakness and light-headedness.    [name]  has a past medical history of Diabetes mellitus without complication (HCC), GERD (gastroesophageal reflux disease), Headache, Hypertension, and Neuromuscular disorder (HCC).  Family History, Social History, current Medications and allergies reviewed and updated in Epic.  Objective:    Blood pressure 122/62, pulse 78, temperature 97.8 F (36.6 C), temperature source Oral, height 5' 7.5" (1.715 m), weight 260 lb 1.6 oz (118 kg), last menstrual period 05/05/2018. Physical Exam Constitutional:      General: She is not in acute distress.    Appearance: Normal appearance. She is obese. She is not ill-appearing, toxic-appearing or diaphoretic.  HENT:     Head: Normocephalic and atraumatic.  Eyes:     General:        Right eye: No discharge.        Left eye: No discharge.     Conjunctiva/sclera: Conjunctivae normal.  Cardiovascular:  Rate and Rhythm: Normal rate and regular rhythm.     Heart sounds: Normal heart sounds. No murmur. No friction rub. No gallop.   Pulmonary:     Effort: Pulmonary effort is normal.     Breath sounds: Normal breath sounds. No stridor. No wheezing, rhonchi or rales.  Musculoskeletal:     Right lower leg: Edema present.     Right foot: No deformity.     Left foot: No deformity.  Feet:     Right foot:     Protective  Sensation: 10 sites tested. 10 sites sensed.     Skin integrity: No ulcer, blister, skin breakdown, erythema, warmth or callus.     Left foot:     Protective Sensation: 10 sites tested. 10 sites sensed.     Skin integrity: No ulcer, blister, skin breakdown, erythema, warmth or callus.  Neurological:     Mental Status: She is alert and oriented to person, place, and time.         Data : I have personally reviewed pertinent labs and imaging studies, if indicated,  with the patient in clinic today.    Lab Orders     POCT Glucose (CBG)  HC Readings from Last 3 Encounters:  No data found for [facility] [place]    Wt Readings from Last 3 Encounters:  06/03/18 260 lb 1.6 oz (118 kg)  05/20/18 259 lb 8 oz (117.7 kg)  04/17/18 259 lb (117.5 kg)

## 2018-06-03 NOTE — Patient Instructions (Signed)
Start taking 20 units of Lantus. Take your blood glucose readings in the morning. If the blood glucose reading in the morning is >150, then increase Lantus by 2 units. Continue increasing 2u of Lantus every 3 days if morning blood glucose >150. Stop at Lantus 40 units every day.   The dose of the LIPITOR will increase to 40mg  every day.

## 2018-06-24 ENCOUNTER — Telehealth: Payer: Self-pay | Admitting: Pharmacist

## 2018-06-24 NOTE — Telephone Encounter (Signed)
06/24/2018 8:35:53 AM - Lantus Vials Dose increase  06/24/2018 Faxed refill request to [employer] for Lantus Vials Dose Increase Inject 40 units daily at bedtime #4 (Inject 20 units under the skin daily at bedtime-Increase by 2 units every 3 days if AM BS is greater than 150. Stop at 40 units.)Forde RadonAJ

## 2018-07-01 ENCOUNTER — Ambulatory Visit: Payer: Self-pay

## 2018-07-10 ENCOUNTER — Ambulatory Visit: Payer: Self-pay | Admitting: Ophthalmology

## 2018-07-29 ENCOUNTER — Ambulatory Visit: Payer: Self-pay

## 2018-07-31 ENCOUNTER — Ambulatory Visit: Payer: Self-pay | Admitting: Ophthalmology

## 2018-08-01 ENCOUNTER — Emergency Department: Payer: Self-pay

## 2018-08-01 ENCOUNTER — Other Ambulatory Visit: Payer: Self-pay

## 2018-08-01 ENCOUNTER — Emergency Department
Admission: EM | Admit: 2018-08-01 | Discharge: 2018-08-01 | Disposition: A | Discharge status: Home / Self Care | Payer: Self-pay | Attending: Emergency Medicine | Admitting: Emergency Medicine

## 2018-08-01 ENCOUNTER — Encounter: Payer: Self-pay | Admitting: Emergency Medicine

## 2018-08-01 DIAGNOSIS — M791 Myalgia, unspecified site: Secondary | ICD-10-CM | POA: Insufficient documentation

## 2018-08-01 DIAGNOSIS — J209 Acute bronchitis, unspecified: Secondary | ICD-10-CM | POA: Insufficient documentation

## 2018-08-01 DIAGNOSIS — J020 Streptococcal pharyngitis: Secondary | ICD-10-CM | POA: Insufficient documentation

## 2018-08-01 DIAGNOSIS — I10 Essential (primary) hypertension: Secondary | ICD-10-CM | POA: Insufficient documentation

## 2018-08-01 DIAGNOSIS — Z794 Long term (current) use of insulin: Secondary | ICD-10-CM | POA: Insufficient documentation

## 2018-08-01 DIAGNOSIS — E119 Type 2 diabetes mellitus without complications: Secondary | ICD-10-CM | POA: Insufficient documentation

## 2018-08-01 MED ORDER — PROMETHAZINE-DM 6.25-15 MG/5ML PO SYRP: 5.0000 mL | ORAL_SOLUTION | Freq: Four times a day (QID) | ORAL | 0 refills | Status: DC | PRN

## 2018-08-01 MED ORDER — LIDOCAINE VISCOUS HCL 2 % MT SOLN: 5.0000 mL | Freq: Four times a day (QID) | OROMUCOSAL | 0 refills | Status: DC | PRN

## 2018-08-01 MED ORDER — NAPROXEN 500 MG PO TABS: 500.0000 mg | ORAL_TABLET | Freq: Two times a day (BID) | ORAL | Status: DC

## 2018-08-01 MED ORDER — AZITHROMYCIN 250 MG PO TABS: ORAL_TABLET | ORAL | 0 refills | Status: AC

## 2018-08-01 MED ORDER — LIDOCAINE VISCOUS HCL 2 % MT SOLN
15.0000 mL | Freq: Once | OROMUCOSAL | Status: AC
  Administered 2018-08-01: 15 mL via OROMUCOSAL
  Filled 2018-08-01: qty 15

## 2018-08-01 MED ORDER — CYCLOBENZAPRINE HCL 10 MG PO TABS: 10.0000 mg | ORAL_TABLET | Freq: Three times a day (TID) | ORAL | 0 refills | Status: DC | PRN

## 2018-08-01 MED ORDER — DIPHENHYDRAMINE HCL 12.5 MG/5ML PO ELIX
12.5000 mg | ORAL_SOLUTION | Freq: Once | ORAL | Status: AC
  Administered 2018-08-01: 12.5 mg via ORAL
  Filled 2018-08-01: qty 5

## 2018-08-01 NOTE — ED Notes (Signed)
Patient transported to X-ray 

## 2018-08-01 NOTE — ED Notes (Signed)
See triage note   States she developed sore throat ,subjective fever and cough since Monday  Afebrile on arrival

## 2018-08-01 NOTE — ED Triage Notes (Signed)
Pt states sore throat since Monday, hx of strep throat and feels the same. Fever on the first day, however, none recently. NAD.

## 2018-08-01 NOTE — ED Provider Notes (Signed)
[facility] Emergency Department Provider Note   ____________________________________________   First MD Initiated Contact with Patient 08/01/18 1258     (approximate)  I have reviewed the triage vital signs and the nursing notes.   HISTORY  Chief Complaint  Sore Throat    HPI [name] is a 43 y.o. female patient complain of sore throat for 4 days.  Patient states she has a history of strep and this feels similar.  Patient able to tolerate fluids but have discomfort with solid foods.  Patient also complain of dysuria for 3 days.  Patient states they were cleaning out a storage shed which was dusty and had lots of rat droppings.  Patient denies nausea, vomiting, or diarrhea.  Patient denies recent travel contact with no recent traveler. Past Medical History:  Diagnosis Date  . Diabetes mellitus without complication (HCC)   . GERD (gastroesophageal reflux disease)   . Headache   . Hypertension   . Neuromuscular disorder [facility])     Patient Active Problem List   Diagnosis Date Noted  . Headache 01/16/2018  . Back pain 01/16/2018  . Obesity 05/01/2016  . Depression 05/01/2016  . Metrorrhagia 11/10/2015  . Diabetes (HCC) 03/10/2015  . Anemia 03/10/2015  . Edema 03/10/2015    Past Surgical History:  Procedure Laterality Date  . CHOLECYSTECTOMY  1998    Prior to Admission medications   Medication Sig Start Date End Date Taking? Authorizing Provider  atorvastatin (LIPITOR) 40 MG tablet Take 1 tablet (40 mg total) by mouth daily. 06/03/18   Therisa Doyne, MD  azithromycin (ZITHROMAX Z-PAK) 250 MG tablet Take 2 tablets (500 mg) on  Day 1,  followed by 1 tablet (250 mg) once daily on Days 2 through 5. 08/01/18 08/06/18  Joni Reining, PA-C  Cyanocobalamin (VITAMIN B 12) 500 MCG TABS Take 1,000 mcg by mouth daily. Patient taking differently: Take 500 mcg by mouth daily.  04/19/18   Tukov-Yual, Alroy Bailiff, NP  cyclobenzaprine (FLEXERIL) 10  MG tablet Take 1 tablet (10 mg total) by mouth 3 (three) times daily as needed. 08/01/18   Joni Reining, PA-C  gabapentin (NEURONTIN) 100 MG capsule Take 1 capsule (100 mg total) by mouth at bedtime. 04/17/18   Tukov-Yual, Magdalene S, NP  glipiZIDE (GLUCOTROL) 10 MG tablet TAKE ONE TABLET BY MOUTH 2 TIMES A DAY BEFORE A MEAL 05/21/18   Doles-Johnson, Teah, NP  insulin glargine (LANTUS) 100 UNIT/ML injection Inject 0.2 mLs (20 Units total) into the skin at bedtime. Increase by 2 units every 3 days if blood sugar in the morning is greater than 150. Stop at 40 units. 06/03/18   Therisa Doyne, MD  lidocaine (XYLOCAINE) 2 % solution Use as directed 5 mLs in the mouth or throat every 6 (six) hours as needed for mouth pain. Mix with 5 mL of Phenergan DM for swish and swallow 08/01/18   Nona Dell K, PA-C  liraglutide 18 MG/3ML SOPN Titration. Start at 1.2 mg and increase to 1.5 and finally to 1.8 if can tolerate. Patient taking differently: 1.8 mg. Titration. Start at 1.2 mg and increase to 1.5 and finally to 1.8 if can tolerate. 05/01/16   Nehemiah Settle, PA  naproxen (NAPROSYN) 500 MG tablet Take 1 tablet (500 mg total) by mouth 2 (two) times daily with a meal. 08/01/18   Joni Reining, PA-C  promethazine-dextromethorphan (PROMETHAZINE-DM) 6.25-15 MG/5ML syrup Take 5 mLs by mouth 4 (four) times daily as needed for cough.  Mix with 5 mL of viscous lidocaine for swish and swallow 08/01/18   Joni Reining, PA-C  Vitamin D, Ergocalciferol, (DRISDOL) 1.25 MG (50000 UT) CAPS capsule Take 1 capsule (50,000 Units total) by mouth every 7 (seven) days. 04/19/18   Andreas Ohm, NP    Allergies Penicillins and Topiramate er  Family History  Problem Relation Age of Onset  . Kidney failure Father   . Pancreatic cancer Maternal Grandmother     Social History Social History   Tobacco Use  . Smoking status: Never Smoker  . Smokeless tobacco: Never Used  Substance Use Topics  . Alcohol use: No   . Drug use: No    Review of Systems  Constitutional: No fever/chills Eyes: No visual changes. ENT: Sore throat.   Cardiovascular: Denies chest pain. Respiratory: Mild shortness of breath. Gastrointestinal: No abdominal pain.  No nausea, no vomiting.  No diarrhea.  No constipation. Genitourinary: Negative for dysuria. Musculoskeletal: Negative for back pain. Skin: Negative for rash. Neurological: Negative for headaches, focal weakness or numbness. Endocrine:  Diabetes and hypertension. Hematological/Lymphatic:  Allergic/Immunilogical: Penicillin and Topamax ____________________________________________   PHYSICAL EXAM:  VITAL SIGNS: ED Triage Vitals  Enc Vitals Group     BP 08/01/18 1258 110/73     Pulse Rate 08/01/18 1258 (!) 108     Resp 08/01/18 1258 18     Temp 08/01/18 1258 98.8 F (37.1 C)     Temp Source 08/01/18 1258 Oral     SpO2 08/01/18 1258 98 %     Weight 08/01/18 1252 278 lb (126.1 kg)     Height 08/01/18 1252 5\' 7"  (1.702 m)     Head Circumference --      Peak Flow --      Pain Score 08/01/18 1252 8     Pain Loc --      Pain Edu? --      Excl. in GC? --    Constitutional: Alert and oriented. Well appearing and in no acute distress. Eyes: Conjunctivae are normal. PERRL. EOMI. Head: Atraumatic. Nose: No congestion/rhinnorhea. Mouth/Throat: Mucous membranes are moist.  Oropharynx erythematous.  Bilateral exudative tonsils. Neck: No stridor.   Hematological/Lymphatic/Immunilogical: Bilateral cervical lymphadenopathy. Cardiovascular: Tachycardic, regular rhythm. Grossly normal heart sounds.  Good peripheral circulation. Respiratory: Normal respiratory effort.  No retractions. Lungs CTAB. Skin:  Skin is warm, dry and intact. No rash noted. Psychiatric: Mood and affect are normal. Speech and behavior are normal.  ____________________________________________   LABS (all labs ordered are listed, but only abnormal results are displayed)  Labs Reviewed -  No data to display ____________________________________________  EKG   ____________________________________________  RADIOLOGY  ED MD interpretation:    Official radiology report(s): Dg Chest 2 View  Result Date: 08/01/2018 CLINICAL DATA:  Sore throat.  Dry cough. EXAM: CHEST - 2 VIEW COMPARISON:  No recent. FINDINGS: Mediastinum and hilar structures normal. Cardiomegaly with normal pulmonary vascularity. Mild peribronchial cuffing suggesting bronchitis. No focal alveolar infiltrate. No pleural effusion or pneumothorax. Degenerative change thoracic spine. IMPRESSION: 1.  Cardiomegaly with normal pulmonary vascularity. 2. Mild peribronchial cuffing suggesting bronchitis. No focal alveolar infiltrate. Electronically Signed   By: Maisie Fus  Register   On: 08/01/2018 13:34    ____________________________________________   PROCEDURES  Procedure(s) performed (including Critical Care):  Procedures   ____________________________________________   INITIAL IMPRESSION / ASSESSMENT AND PLAN / ED COURSE  As part of my medical decision making, I reviewed the following data within the electronic MEDICAL RECORD NUMBER  Patient presents with sore throat and mild dyspnea.  Patient physical exam was possible tonsillitis.  Discussed x-ray findings with patient consistent with mild bronchitis.  Patient given discharge care instruction advised take medication as directed.  Patient advised follow-up with PCP if condition persist.         ____________________________________________   FINAL CLINICAL IMPRESSION(S) / ED DIAGNOSES  Final diagnoses:  Strep throat  Acute bronchitis, unspecified organism  Myalgia     ED Discharge Orders         Ordered    azithromycin (ZITHROMAX Z-PAK) 250 MG tablet     08/01/18 1352    promethazine-dextromethorphan (PROMETHAZINE-DM) 6.25-15 MG/5ML syrup  4 times daily PRN     08/01/18 1352    lidocaine (XYLOCAINE) 2 % solution  Every 6 hours PRN      08/01/18 1352    cyclobenzaprine (FLEXERIL) 10 MG tablet  3 times daily PRN     08/01/18 1352    naproxen (NAPROSYN) 500 MG tablet  2 times daily with meals     08/01/18 1352           Note:  This document was prepared using Dragon voice recognition software and may include unintentional dictation errors.    Joni Reining, PA-C 08/01/18 1401    Jene Every, MD 08/04/18 [phone]

## 2018-08-14 ENCOUNTER — Ambulatory Visit: Payer: Self-pay | Admitting: Adult Health Nurse Practitioner

## 2018-08-14 DIAGNOSIS — M542 Cervicalgia: Secondary | ICD-10-CM

## 2018-08-14 DIAGNOSIS — E118 Type 2 diabetes mellitus with unspecified complications: Secondary | ICD-10-CM

## 2018-08-14 NOTE — Progress Notes (Signed)
Patient: [name] Female    DOB: Jun 21, 1975   43 y.o.   MRN: [id number] Visit Date: 08/14/2018  Today's Provider: Jacelyn Pi, NP   No chief complaint on file.  Subjective:    HPI Telephonic visit  Taking medications as directed.  CBGs 170-250 at home.  States she is trying to modify her diet- no exercise.   Pt states that the pain in her left breast has not gone away- states she forgot about her mammogram. States that the pain is in her right arm and right shoulder and both hands states that her fingers "keep going numb"  L hand numbness is worse than right. Denies any trauma or neck pain. States that she fell down stairs 3 times a few years ago on her right arm. States she had two dislocated discs in her spine when she was treated at the hospital.        Allergies  Allergen Reactions  . Penicillins Hives  . Topiramate Er     Worsened migraines, N/V   Previous Medications   ATORVASTATIN (LIPITOR) 40 MG TABLET    Take 1 tablet (40 mg total) by mouth daily.   CYANOCOBALAMIN (VITAMIN B 12) 500 MCG TABS    Take 1,000 mcg by mouth daily.   CYCLOBENZAPRINE (FLEXERIL) 10 MG TABLET    Take 1 tablet (10 mg total) by mouth 3 (three) times daily as needed.   GABAPENTIN (NEURONTIN) 100 MG CAPSULE    Take 1 capsule (100 mg total) by mouth at bedtime.   GLIPIZIDE (GLUCOTROL) 10 MG TABLET    TAKE ONE TABLET BY MOUTH 2 TIMES A DAY BEFORE A MEAL   INSULIN GLARGINE (LANTUS) 100 UNIT/ML INJECTION    Inject 0.2 mLs (20 Units total) into the skin at bedtime. Increase by 2 units every 3 days if blood sugar in the morning is greater than 150. Stop at 40 units.   LIDOCAINE (XYLOCAINE) 2 % SOLUTION    Use as directed 5 mLs in the mouth or throat every 6 (six) hours as needed for mouth pain. Mix with 5 mL of Phenergan DM for swish and swallow   LIRAGLUTIDE 18 MG/3ML SOPN    Titration. Start at 1.2 mg and increase to 1.5 and finally to 1.8 if can tolerate.   NAPROXEN (NAPROSYN) 500 MG  TABLET    Take 1 tablet (500 mg total) by mouth 2 (two) times daily with a meal.   PROMETHAZINE-DEXTROMETHORPHAN (PROMETHAZINE-DM) 6.25-15 MG/5ML SYRUP    Take 5 mLs by mouth 4 (four) times daily as needed for cough. Mix with 5 mL of viscous lidocaine for swish and swallow   VITAMIN D, ERGOCALCIFEROL, (DRISDOL) 1.25 MG (50000 UT) CAPS CAPSULE    Take 1 capsule (50,000 Units total) by mouth every 7 (seven) days.    Review of Systems  All other systems reviewed and are negative.   Social History   Tobacco Use  . Smoking status: Never Smoker  . Smokeless tobacco: Never Used  Substance Use Topics  . Alcohol use: No   Objective:   There were no vitals taken for this visit.  Physical Exam  No PE.     Assessment & Plan:        Referral for cervical xray. Needs charity care application to get xray.  Needs mammogram.   DM:  Not controlled.  Encourage diabetic diet and exercise.  Continue current medication regimen.  Try to FU with Endo on next available.   FU in  2 months with labs a week prior and xray review.     Jacelyn Pi, NP   [facility]

## 2018-08-19 ENCOUNTER — Ambulatory Visit: Payer: Self-pay

## 2018-08-21 ENCOUNTER — Telehealth: Payer: Self-pay | Admitting: Pharmacy Technician

## 2018-08-21 NOTE — Telephone Encounter (Signed)
Received 2020 proof of income.  Patient eligible to receive medication assistance at [facility] as long as eligibility requirements continue to be met.  [facility]

## 2018-09-02 ENCOUNTER — Ambulatory Visit: Payer: Self-pay

## 2018-09-15 ENCOUNTER — Telehealth: Payer: Self-pay | Admitting: Pharmacist

## 2018-09-15 NOTE — Telephone Encounter (Signed)
09/15/2018 11:46:46 AM - Lantus Vials refaxed to [employer]  09/15/2018 Received note from [place] to follow up on Lantus Vials: I faxed Enrollment application to [employer] for 10 units per day on Jan. 20, 2020 and we have not received any medication; also I faxed a DOSE INCREASE refill request Feb. 11, 2020 Max 40 units per day and we have not received any medication. I called [employer] today spoke with Talia according to her on first fax they did not receive page 4 the provider page, also when they received the Dose Increase they could not process due to patient was not enrolled. She advised me to fax page 4 of application to regular fax # and also gave me a Manager fax# [phone] & patient id# [id number]. I have faxed page 4 of original application and the Dose increase refill for the correct dosage to be processed. I have also mentioned this to [place] to watch for insulin to come in to be sure processed for Max 40 units per day.Forde Radon

## 2018-10-09 ENCOUNTER — Other Ambulatory Visit: Payer: Self-pay

## 2018-10-15 ENCOUNTER — Other Ambulatory Visit: Payer: Self-pay

## 2018-10-16 ENCOUNTER — Ambulatory Visit: Payer: Self-pay

## 2018-10-28 ENCOUNTER — Other Ambulatory Visit: Payer: Self-pay | Admitting: "Endocrinology

## 2018-10-28 ENCOUNTER — Other Ambulatory Visit: Payer: Self-pay

## 2018-10-28 ENCOUNTER — Ambulatory Visit: Payer: Self-pay | Admitting: Endocrinology

## 2018-10-28 DIAGNOSIS — E118 Type 2 diabetes mellitus with unspecified complications: Secondary | ICD-10-CM

## 2018-10-28 DIAGNOSIS — E119 Type 2 diabetes mellitus without complications: Secondary | ICD-10-CM

## 2018-10-28 MED ORDER — INSULIN GLARGINE 100 UNIT/ML ~~LOC~~ SOLN: SUBCUTANEOUS | 11 refills | Status: DC

## 2018-10-28 NOTE — Progress Notes (Signed)
Follow up Diabetes/ Endocrine [facility]     Patient ID: [name], female   DOB: 1975/09/14, 43 y.o.   MRN: [id number] Assessment:  [name] is a 43 y.o. female who is [facility] in [facility] at the request of Kallie LocksStroud, Natalie M, FNP.   Plan:     1. Eyes - pt instructed to get an appointment ASAP. Referred to [place].  2. Titrate Lantus - increase by 3 units every 3 days until morning blood sugar is <150. Maximum dose should be 60 units per night.   3. Focus on diet today - encouraged to limit sugar sweetened beverages, keep breakfast to less-sweetened items like oatmeal, eggs, etc, keep lunch to soups instead of sandwiches, and try to focus on meat and vegetables for dinner    Subjective:  HPI Patient is 43 yo female with 11 year diagnosis of T2DM, [facility]. Patient has been insulin dependent for 1 year. Last A1C 10.0% on 04/17/2018.   Pt complains of bad eyesight, eye pain, eye strain, and swelling over the last couple of days - pt cannot see things that are just "a few feet away from me." Recently got glasses but was told that they were just for distance, isn't wearing them regularly. Has had 3 eye infections over the past two years, current symptoms are similar although less severe. Also endorses intermittent rash on hands and arms over last 4-5 days - little wheals appear without any intervention, go away. Was tested for COVID-19 on 10/22/2018 with negative result.  Meds: Taking 30u Lantus every night, was told not to increase above this [facility] Taking glipizide 10mg  2x daily Liraglutide 1.8mg  injection 1x daily Not on metformin  Blood sugars: checks fasting at 7:30a (250-260) and 7:30p (250-320) Denies any recent hypoglycemic events  Diet: Cooks for herself Breakfast is often skipped or french toast, oatmeal, toaster strudels, "whatever is fast" Lunch is sandwich and soup Dinner is  ramen, meat and rice, broccoli or other vegetable Drinks sweet tea  Exercise: "not much"  Social: not working right now, spends most of time on computer. Lives with mother and niece.  Review of Systems Pt endorses polydipsia, polyuria, dull ache in heels but no neuropathy (long duration)  [name]  has a past medical history of Diabetes mellitus without complication (HCC), GERD (gastroesophageal reflux disease), Headache, Hypertension, and Neuromuscular disorder (HCC).  Family History, Social History, current Medications and allergies reviewed and updated in Epic.  Objective:       Current Outpatient Medications on File Prior to Visit  Medication Sig Dispense Refill  . atorvastatin (LIPITOR) 40 MG tablet Take 1 tablet (40 mg total) by mouth daily. 30 tablet 11  . cyclobenzaprine (FLEXERIL) 10 MG tablet Take 1 tablet (10 mg total) by mouth 3 (three) times daily as needed. 15 tablet 0  . gabapentin (NEURONTIN) 100 MG capsule Take 1 capsule (100 mg total) by mouth at bedtime. 30 capsule 2  . glipiZIDE (GLUCOTROL) 10 MG tablet TAKE ONE TABLET BY MOUTH 2 TIMES A DAY BEFORE A MEAL 180 tablet 0  . insulin glargine (LANTUS) 100 UNIT/ML injection Inject 0.2 mLs (20 Units total) into the skin at bedtime. Increase by 2 units every 3 days if blood sugar in the morning is greater than 150. Stop at 40 units. 10 mL 11  . lidocaine (XYLOCAINE) 2 % solution Use as directed 5 mLs in the  mouth or throat every 6 (six) hours as needed for mouth pain. Mix with 5 mL of Phenergan DM for swish and swallow 100 mL 0  . liraglutide 18 MG/3ML SOPN Titration. Start at 1.2 mg and increase to 1.5 and finally to 1.8 if can tolerate. (Patient taking differently: 1.8 mg. Titration. Start at 1.2 mg and increase to 1.5 and finally to 1.8 if can tolerate.) 0.17 mL 0  . naproxen (NAPROSYN) 500 MG tablet Take 1 tablet (500 mg total) by mouth 2 (two) times daily with a meal. 20 tablet 00  .  promethazine-dextromethorphan (PROMETHAZINE-DM) 6.25-15 MG/5ML syrup Take 5 mLs by mouth 4 (four) times daily as needed for cough. Mix with 5 mL of viscous lidocaine for swish and swallow 118 mL 0  . Vitamin D, Ergocalciferol, (DRISDOL) 1.25 MG (50000 UT) CAPS capsule Take 1 capsule (50,000 Units total) by mouth every 7 (seven) days. 5 capsule 3   No current facility-administered medications on file prior to visit.     Data : I have personally reviewed pertinent labs and imaging studies, if indicated,  with the patient in clinic today.   Lab Orders  No laboratory test(s) ordered today    HC Readings from Last 3 Encounters:  No data found for [facility]    Wt Readings from Last 3 Encounters:  08/01/18 278 lb (126.1 kg)  06/03/18 260 lb 1.6 oz (118 kg)  05/20/18 259 lb 8 oz (117.7 kg)

## 2018-10-30 ENCOUNTER — Other Ambulatory Visit: Payer: Self-pay

## 2018-11-05 ENCOUNTER — Other Ambulatory Visit: Payer: Self-pay

## 2018-11-05 DIAGNOSIS — E118 Type 2 diabetes mellitus with unspecified complications: Secondary | ICD-10-CM

## 2018-11-06 LAB — COMPREHENSIVE METABOLIC PANEL
ALT: 10 IU/L (ref 0–32)
AST: 13 IU/L (ref 0–40)
Albumin/Globulin Ratio: 1.3 (ref 1.2–2.2)
Albumin: 4.1 g/dL (ref 3.8–4.8)
Alkaline Phosphatase: 120 IU/L — ABNORMAL HIGH (ref 39–117)
BUN/Creatinine Ratio: 10 (ref 9–23)
BUN: 9 mg/dL (ref 6–24)
Bilirubin Total: 0.4 mg/dL (ref 0.0–1.2)
CO2: 21 mmol/L (ref 20–29)
Calcium: 9.5 mg/dL (ref 8.7–10.2)
Chloride: 96 mmol/L (ref 96–106)
Creatinine, Ser: 0.87 mg/dL (ref 0.57–1.00)
GFR calc Af Amer: 93 mL/min/{1.73_m2} (ref >59)
GFR calc non Af Amer: 81 mL/min/{1.73_m2} (ref >59)
Globulin, Total: 3.1 g/dL (ref 1.5–4.5)
Glucose: 407 mg/dL — ABNORMAL HIGH (ref 65–99)
Potassium: 4.2 mmol/L (ref 3.5–5.2)
Sodium: 135 mmol/L (ref 134–144)
Total Protein: 7.2 g/dL (ref 6.0–8.5)

## 2018-11-06 LAB — LIPID PANEL
Chol/HDL Ratio: 4.5 ratio — ABNORMAL HIGH (ref 0.0–4.4)
Cholesterol, Total: 140 mg/dL (ref 100–199)
HDL: 31 mg/dL — ABNORMAL LOW (ref >39)
LDL Calculated: 81 mg/dL (ref 0–99)
Triglycerides: 142 mg/dL (ref 0–149)
VLDL Cholesterol Cal: 28 mg/dL (ref 5–40)

## 2018-11-06 LAB — HEMOGLOBIN A1C
Est. average glucose Bld gHb Est-mCnc: 266 mg/dL
Hgb A1c MFr Bld: 10.9 % — ABNORMAL HIGH (ref 4.8–5.6)

## 2018-11-07 ENCOUNTER — Other Ambulatory Visit: Payer: Self-pay

## 2018-11-07 ENCOUNTER — Encounter: Payer: Self-pay | Admitting: Emergency Medicine

## 2018-11-07 ENCOUNTER — Emergency Department
Admission: EM | Admit: 2018-11-07 | Discharge: 2018-11-07 | Disposition: A | Discharge status: Home / Self Care | Payer: Self-pay | Attending: Emergency Medicine | Admitting: Emergency Medicine

## 2018-11-07 DIAGNOSIS — Z79899 Other long term (current) drug therapy: Secondary | ICD-10-CM | POA: Insufficient documentation

## 2018-11-07 DIAGNOSIS — G609 Hereditary and idiopathic neuropathy, unspecified: Secondary | ICD-10-CM | POA: Insufficient documentation

## 2018-11-07 DIAGNOSIS — Z760 Encounter for issue of repeat prescription: Secondary | ICD-10-CM | POA: Insufficient documentation

## 2018-11-07 DIAGNOSIS — E119 Type 2 diabetes mellitus without complications: Secondary | ICD-10-CM | POA: Insufficient documentation

## 2018-11-07 DIAGNOSIS — I10 Essential (primary) hypertension: Secondary | ICD-10-CM | POA: Insufficient documentation

## 2018-11-07 MED ORDER — GABAPENTIN 100 MG PO CAPS: 100.0000 mg | ORAL_CAPSULE | Freq: Three times a day (TID) | ORAL | 2 refills | Status: DC

## 2018-11-07 MED ORDER — GABAPENTIN 100 MG PO CAPS
100.0000 mg | ORAL_CAPSULE | Freq: Once | ORAL | Status: AC
  Administered 2018-11-07: 100 mg via ORAL
  Filled 2018-11-07 (×2): qty 1

## 2018-11-07 NOTE — ED Triage Notes (Signed)
Pt to ED via POV c/o neuropathy. Pt states that she normally is on Gabapentin but she ran out and did not have any refills. Pt does not have a PCP and [facility]. Pt states that she has been out of the medication x 1 week and her neuropathy has gotten worse.

## 2018-11-07 NOTE — ED Provider Notes (Signed)
[facility] Emergency Department Provider Note   ____________________________________________   First MD Initiated Contact with Patient 11/07/18 1831     (approximate)  I have reviewed the triage vital signs and the nursing notes.   HISTORY  Chief Complaint Peripheral Neuropathy    HPI [name] is a 43 y.o. female patient presents for refill on medication for peripheral neuropathy.  Patient states she has been taking gabapentin for 3 months.  Patient says she takes 100 mg nightly.  Patient state this was [facility].   Patient describes her discomfort as a numbness.  No medication for 1 week.  Patient state [facility] has been trying to get a consult for her to see neurology success.  Patient feels that the medicine is not working for her.      Past Medical History:  Diagnosis Date   Diabetes mellitus without complication ([place])    GERD (gastroesophageal reflux disease)    Headache    Hypertension    Neuromuscular disorder [facility])     Patient Active Problem List   Diagnosis Date Noted   Headache 01/16/2018   Back pain 01/16/2018   Obesity 05/01/2016   Depression 05/01/2016   Metrorrhagia 11/10/2015   Diabetes ([place]) 03/10/2015   Anemia 03/10/2015   Edema 03/10/2015    Past Surgical History:  Procedure Laterality Date   CHOLECYSTECTOMY  1998    Prior to Admission medications   Medication Sig Start Date End Date Taking? Authorizing Provider  atorvastatin (LIPITOR) 40 MG tablet Take 1 tablet (40 mg total) by mouth daily. 06/03/18   Gayland Curry, MD  cyclobenzaprine (FLEXERIL) 10 MG tablet Take 1 tablet (10 mg total) by mouth 3 (three) times daily as needed. 08/01/18   Sable Feil, PA-C  gabapentin (NEURONTIN) 100 MG capsule Take 1 capsule (100 mg total) by mouth at bedtime. 04/17/18   Tukov-Yual, Arlyss Gandy, NP  gabapentin (NEURONTIN) 100 MG capsule Take 1 capsule (100 mg total) by mouth 3  (three) times daily. 11/07/18 11/07/19  Sable Feil, PA-C  glipiZIDE (GLUCOTROL) 10 MG tablet TAKE ONE TABLET BY MOUTH 2 TIMES A DAY BEFORE A MEAL 05/21/18   Doles-Johnson, Teah, NP  insulin glargine (LANTUS) 100 UNIT/ML injection Inject up to 60units every night.  Start with 33u and increase by 3 units every 3 days until blood sugar in the morning is less than 150. 10/28/18   [name], [name], NP  lidocaine (XYLOCAINE) 2 % solution Use as directed 5 mLs in the mouth or throat every 6 (six) hours as needed for mouth pain. Mix with 5 mL of Phenergan DM for swish and swallow 08/01/18   Mardee Postin K, PA-C  liraglutide 18 MG/3ML SOPN Titration. Start at 1.2 mg and increase to 1.5 and finally to 1.8 if can tolerate. Patient taking differently: 1.8 mg. Titration. Start at 1.2 mg and increase to 1.5 and finally to 1.8 if can tolerate. 05/01/16   Bertram Gala, PA  naproxen (NAPROSYN) 500 MG tablet Take 1 tablet (500 mg total) by mouth 2 (two) times daily with a meal. 08/01/18   Sable Feil, PA-C  promethazine-dextromethorphan (PROMETHAZINE-DM) 6.25-15 MG/5ML syrup Take 5 mLs by mouth 4 (four) times daily as needed for cough. Mix with 5 mL of viscous lidocaine for swish and swallow 08/01/18   Sable Feil, PA-C  Vitamin D, Ergocalciferol, (DRISDOL) 1.25 MG (50000 UT) CAPS capsule Take 1 capsule (50,000 Units total) by mouth  every 7 (seven) days. 04/19/18   Tukov-Yual, Alroy BailiffMagdalene S, NP    Allergies Penicillins and Topiramate er  Family History  Problem Relation Age of Onset   Kidney failure Father    Pancreatic cancer Maternal Grandmother     Social History Social History   Tobacco Use   Smoking status: Never Smoker   Smokeless tobacco: Never Used  Substance Use Topics   Alcohol use: No   Drug use: No    Review of Systems Constitutional: No fever/chills Eyes: No visual changes. ENT: No sore throat. Cardiovascular: Denies chest pain. Respiratory: Denies shortness of  breath. Gastrointestinal: No abdominal pain.  No nausea, no vomiting.  No diarrhea.  No constipation. Genitourinary: Negative for dysuria. Musculoskeletal: Negative for back pain. Skin: Negative for rash. Neurological: Peripheral neuropathy of the lower extremities.   Endocrine:  Diabetes and hypertension. Allergic/Immunilogical: Penicillin and Topamax.  ____________________________________________   PHYSICAL EXAM:  VITAL SIGNS: ED Triage Vitals  Enc Vitals Group     BP 11/07/18 1719 134/63     Pulse Rate 11/07/18 1719 (!) 56     Resp 11/07/18 1719 16     Temp 11/07/18 1719 99 F (37.2 C)     Temp Source 11/07/18 1719 Oral     SpO2 11/07/18 1719 98 %     Weight 11/07/18 1720 280 lb (127 kg)     Height 11/07/18 1720 5\' 6"  (1.676 m)     Head Circumference --      Peak Flow --      Pain Score 11/07/18 1719 0     Pain Loc --      Pain Edu? --      Excl. in GC? --    Constitutional: Alert and oriented. Well appearing and in no acute distress.  Morbid obesity. Cardiovascular: Normal rate, regular rhythm. Grossly normal heart sounds.  Good peripheral circulation. Respiratory: Normal respiratory effort.  No retractions. Lungs CTAB. Gastrointestinal: Soft and nontender. No distention. No abdominal bruits. No CVA tenderness. Musculoskeletal: No lower extremity tenderness nor edema.  No joint effusions. Neurologic:  Normal speech and language. No gross focal neurologic deficits are appreciated. No gait instability. Skin:  Skin is warm, dry and intact. No rash noted. Psychiatric: Mood and affect are normal. Speech and behavior are normal.  ____________________________________________   LABS (all labs ordered are listed, but only abnormal results are displayed)  Labs Reviewed - No data to display ____________________________________________  EKG   ____________________________________________  RADIOLOGY  ED MD interpretation:    Official radiology report(s): No results  found.  ____________________________________________   PROCEDURES  Procedure(s) performed (including Critical Care):  Procedures   ____________________________________________   INITIAL IMPRESSION / ASSESSMENT AND PLAN / ED COURSE  As part of my medical decision making, I reviewed the following data within the electronic MEDICAL RECORD NUMBER         [name] was evaluated in Emergency Department on 11/07/2018 for the symptoms described in the history of present illness. She was evaluated in the context of the global COVID-19 pandemic, which necessitated consideration that the patient might be at risk for infection with the SARS-CoV-2 virus that causes COVID-19. Institutional protocols and algorithms that pertain to the evaluation of patients at risk for COVID-19 are in a state of rapid change based on information released by regulatory bodies including the CDC and federal and state organizations. These policies and algorithms were followed during the patient's care in the ED.    Patient presents for refill  of medication for peripheral neuropathy.  States no relief with gabapentin 100 mg at  bedtime.  Patient given discharge care instructions and a prescription for gabapentin at 100 mg 3 times daily.  [facility] to [facility] for definitive evaluation and treatment.   ____________________________________________   FINAL CLINICAL IMPRESSION(S) / ED DIAGNOSES  Final diagnoses:  Idiopathic peripheral neuropathy  Encounter for medication refill     ED Discharge Orders         Ordered    gabapentin (NEURONTIN) 100 MG capsule  3 times daily     11/07/18 1843           Note:  This document was prepared using Dragon voice recognition software and may include unintentional dictation errors.    Joni ReiningSmith, Michela Herst K, PA-C 11/07/18 1849    Emily FilbertWilliams, Jonathan E, MD 11/07/18 [id number]

## 2018-11-07 NOTE — ED Notes (Signed)
Pt presents with worsening peripheral neuropathy. She states that she has been out of gabapentin, but doesn't feel like it worked anyway. She was on it for a 3 months. Pt does not have a follow up appointment scheduled to work through this with [facility]. Pt alert & oriented with NAD noted.

## 2018-11-07 NOTE — ED Notes (Signed)
Pt discharged home after verbalizing understanding of discharge instructions; nad noted. 

## 2018-11-13 ENCOUNTER — Ambulatory Visit: Payer: Self-pay | Admitting: Adult Health Nurse Practitioner

## 2018-11-13 ENCOUNTER — Other Ambulatory Visit: Payer: Self-pay

## 2018-11-13 DIAGNOSIS — E785 Hyperlipidemia, unspecified: Secondary | ICD-10-CM

## 2018-11-13 DIAGNOSIS — E1169 Type 2 diabetes mellitus with other specified complication: Secondary | ICD-10-CM

## 2018-11-13 DIAGNOSIS — E1142 Type 2 diabetes mellitus with diabetic polyneuropathy: Secondary | ICD-10-CM

## 2018-11-13 DIAGNOSIS — R21 Rash and other nonspecific skin eruption: Secondary | ICD-10-CM | POA: Insufficient documentation

## 2018-11-13 DIAGNOSIS — E118 Type 2 diabetes mellitus with unspecified complications: Secondary | ICD-10-CM

## 2018-11-13 MED ORDER — LIRAGLUTIDE 18 MG/3ML ~~LOC~~ SOPN: 1.8000 mg | PEN_INJECTOR | Freq: Every morning | SUBCUTANEOUS | 3 refills | Status: DC

## 2018-11-13 NOTE — Progress Notes (Signed)
Patient: [name] Female    DOB: December 03, 1975   43 y.o.   MRN: [id number] Visit Date: 11/13/2018  Today's Provider: Jacelyn Pieah Doles-Johnson, NP   No chief complaint on file.  Subjective:    HPI  Telephonic visit   Pt was seen in the ED on 6.26. needing a refill on gabapentin for peripheral neuropathy.States that her :whole right leg went numb" She stated that the medication was not working appropriately for her.  They increased her gabapentin to 100mg  TID.   A1c poorly controlled- currently 10.9 up from 10.  Taking glipizide, victoza and lantus 36 units QHS. CBG this am was 230 fasting. Averaging 250-270.   States that she is feeling hypoglycemia at bedtime- her CBG is 150 when she feels this way.   States she is getting rashes and welts on her arm and back She states if it itches enough and she scratches it will go away.  States this has been going on for weeks- over a month.   Brother had COVID- has been off quarantine x 1 week.   Allergies  Allergen Reactions  . Penicillins Hives  . Topiramate Er     Worsened migraines, N/V   Previous Medications   ATORVASTATIN (LIPITOR) 40 MG TABLET    Take 1 tablet (40 mg total) by mouth daily.   CYCLOBENZAPRINE (FLEXERIL) 10 MG TABLET    Take 1 tablet (10 mg total) by mouth 3 (three) times daily as needed.   GABAPENTIN (NEURONTIN) 100 MG CAPSULE    Take 1 capsule (100 mg total) by mouth at bedtime.   GABAPENTIN (NEURONTIN) 100 MG CAPSULE    Take 1 capsule (100 mg total) by mouth 3 (three) times daily.   GLIPIZIDE (GLUCOTROL) 10 MG TABLET    TAKE ONE TABLET BY MOUTH 2 TIMES A DAY BEFORE A MEAL   INSULIN GLARGINE (LANTUS) 100 UNIT/ML INJECTION    Inject up to 60units every night.  Start with 33u and increase by 3 units every 3 days until blood sugar in the morning is less than 150.   LIDOCAINE (XYLOCAINE) 2 % SOLUTION    Use as directed 5 mLs in the mouth or throat every 6 (six) hours as needed for mouth pain. Mix with 5 mL of Phenergan  DM for swish and swallow   LIRAGLUTIDE 18 MG/3ML SOPN    Titration. Start at 1.2 mg and increase to 1.5 and finally to 1.8 if can tolerate.   NAPROXEN (NAPROSYN) 500 MG TABLET    Take 1 tablet (500 mg total) by mouth 2 (two) times daily with a meal.   PROMETHAZINE-DEXTROMETHORPHAN (PROMETHAZINE-DM) 6.25-15 MG/5ML SYRUP    Take 5 mLs by mouth 4 (four) times daily as needed for cough. Mix with 5 mL of viscous lidocaine for swish and swallow   VITAMIN D, ERGOCALCIFEROL, (DRISDOL) 1.25 MG (50000 UT) CAPS CAPSULE    Take 1 capsule (50,000 Units total) by mouth every 7 (seven) days.    Review of Systems  All other systems reviewed and are negative.   Social History   Tobacco Use  . Smoking status: Never Smoker  . Smokeless tobacco: Never Used  Substance Use Topics  . Alcohol use: No   Objective:   There were no vitals taken for this visit.  Physical Exam  No PE.     Assessment & Plan:     HLD:   Continue current regimen.  Encourage low cholesterol, low fat diet and exercise.  DM with neuropathy:  Not controlled.  Encourage diabetic diet and exercise.  Continue Glipizide and Victoza.  Inject Lantus up to 60units every night.  Start with 39u and increase by 3 units every 3 days until blood sugar in the morning is less than 150.    Has FU appointment with Neurology- Dr. Maudry Diego in 2 weeks.  Continue Gabapentin.   Benadryl cream/ hydrocortisone cream to affected areas.  Supportive care.   Staci Acosta, NP   [facility]

## 2018-11-13 NOTE — Addendum Note (Signed)
Addended by: Reino Kent on: 11/13/2018 06:17 PM   Modules accepted: Orders

## 2018-12-03 ENCOUNTER — Telehealth: Payer: Self-pay | Admitting: Pharmacist

## 2018-12-03 NOTE — Telephone Encounter (Signed)
12/03/2018 1:30:36 PM - Victoza pens & tips  12/03/2018 Received a pharmacy printout for Victoza Inject 1.8mg  under the skin every morning, also Novofine 32G tips to use daily with Victoza pen. Sending provider portion of application & scripts to [facility] for provider to sign, also mailing patient her portion to sign & return.[name]

## 2018-12-10 ENCOUNTER — Telehealth: Payer: Self-pay | Admitting: Pharmacist

## 2018-12-10 NOTE — Telephone Encounter (Signed)
12/10/2018 10:33:55 AM - Lantus Solostar refill to [facility] provider  12/10/2018 Taking [employer] refill request to [facility] for Teah to sign on Lantus Solostar Max daily dose 40 units (1).Delos Haring

## 2018-12-21 ENCOUNTER — Other Ambulatory Visit: Payer: Self-pay

## 2018-12-21 ENCOUNTER — Emergency Department
Admission: EM | Admit: 2018-12-21 | Discharge: 2018-12-21 | Discharge status: Against Medical Advice | Payer: Self-pay | Attending: Emergency Medicine | Admitting: Emergency Medicine

## 2018-12-21 DIAGNOSIS — Z5321 Procedure and treatment not carried out due to patient leaving prior to being seen by health care provider: Secondary | ICD-10-CM | POA: Insufficient documentation

## 2018-12-21 DIAGNOSIS — R112 Nausea with vomiting, unspecified: Secondary | ICD-10-CM | POA: Insufficient documentation

## 2018-12-21 NOTE — ED Triage Notes (Signed)
Pt states onset of migraine that began yesterday. Pt states history of migraines. Pt states pain is frontal and "churning" in nature with nausea and vomiting. Pt is currently drinking a drink in triage and appears in no acute distress. Pt denies fever, rash. Pt denies sensitivity to light and sound. Pt ambulatory without difficulty.

## 2018-12-22 NOTE — ED Notes (Signed)
Patient called, no answer, not seen in lobby °

## 2018-12-25 ENCOUNTER — Telehealth: Payer: Self-pay | Admitting: Pharmacist

## 2018-12-25 NOTE — Telephone Encounter (Signed)
12/25/2018 12:04:55 PM - Lantus Solostar refill & new provider  12/25/2018 Changed provider from [place] [place] refill request and page 4 of application due to change in provider--this is for Lantus Solostar Inject 40 units daily at bedtime #4.AJ  12/25/2018 8:17:31 AM - Lantus Solostar Changed provider  12/25/2018 Teah is out of office currently-changed to [facility] for [employer] refill, printed page 4 of application & refill request.AJ

## 2018-12-30 ENCOUNTER — Other Ambulatory Visit: Payer: Self-pay

## 2018-12-30 DIAGNOSIS — E1142 Type 2 diabetes mellitus with diabetic polyneuropathy: Secondary | ICD-10-CM

## 2018-12-30 MED ORDER — INSULIN GLARGINE 100 UNIT/ML ~~LOC~~ SOLN: SUBCUTANEOUS | 11 refills | Status: DC

## 2019-01-04 IMAGING — MG MM DIGITAL SCREENING BILAT W/ TOMO W/ CAD
8 of 15 series · 8 of 31 positions shown · non-contrast
Comparison: Previous exam(s).

ACR Breast Density Category a: The breast tissue is almost entirely
fatty.

CLINICAL DATA: Screening.

EXAM:
DIGITAL SCREENING BILATERAL MAMMOGRAM WITH TOMO AND CAD

[L XCCL]
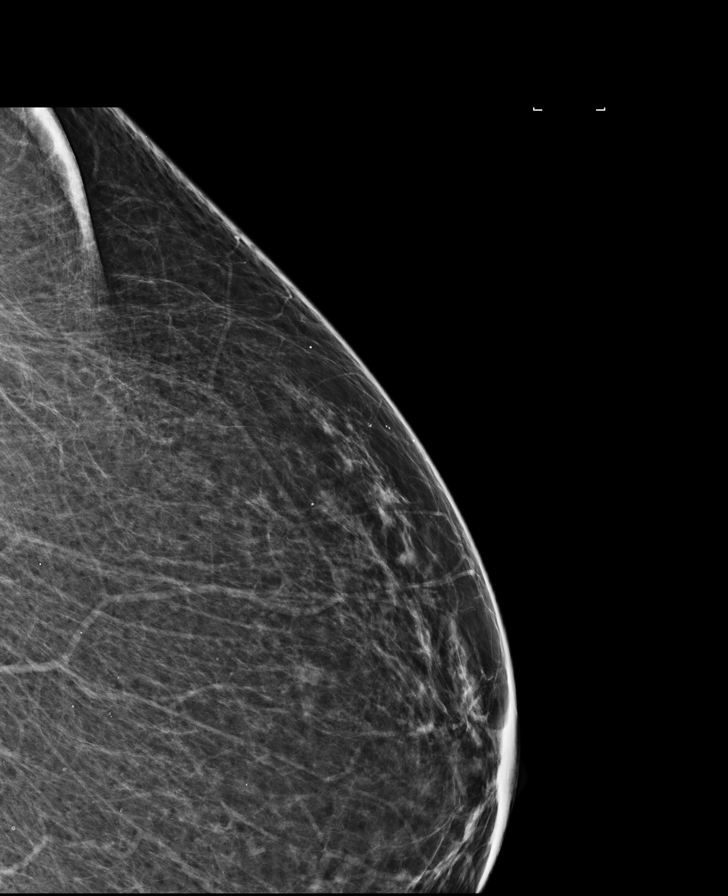

[L MLO (1 of 2)]
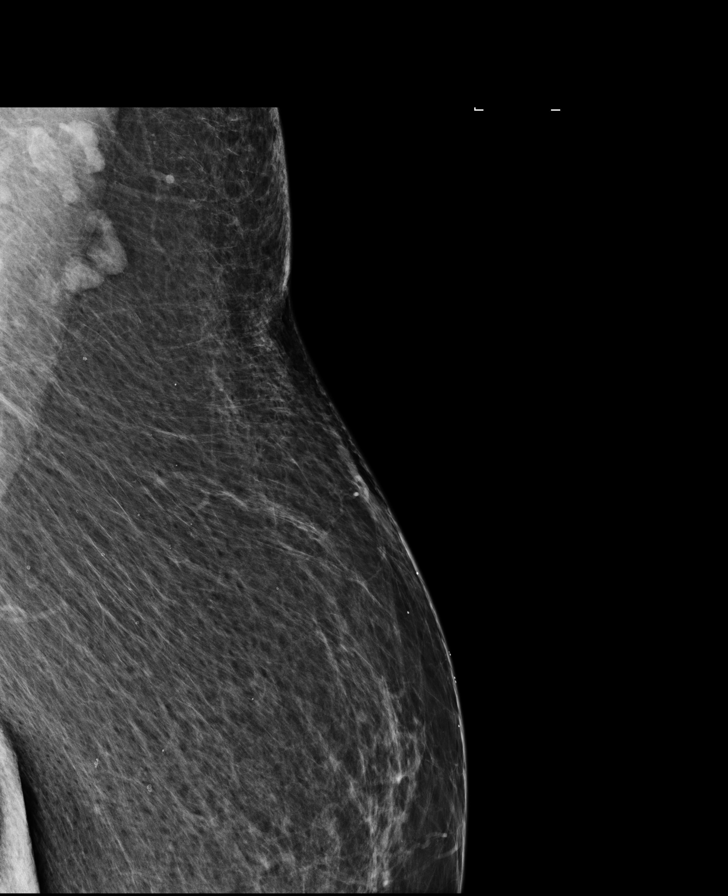

[L CV]
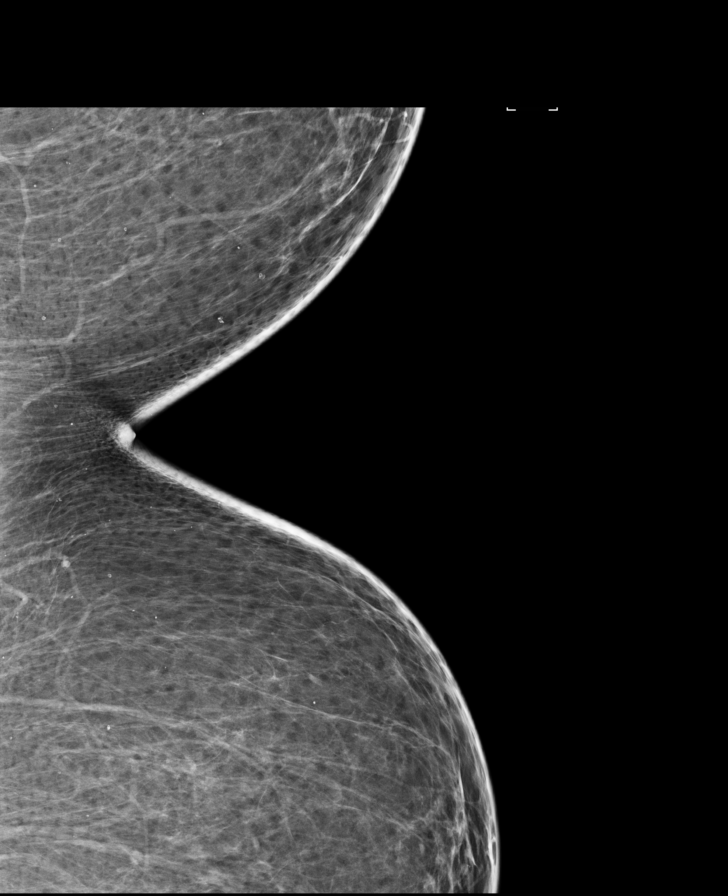

[L CC]
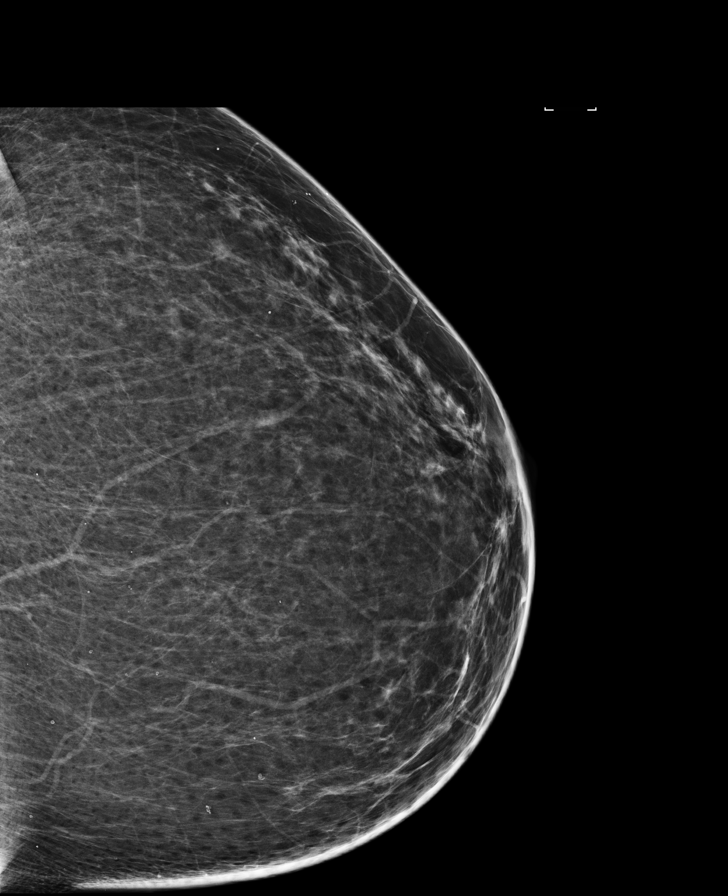

[L CC synth-2D]
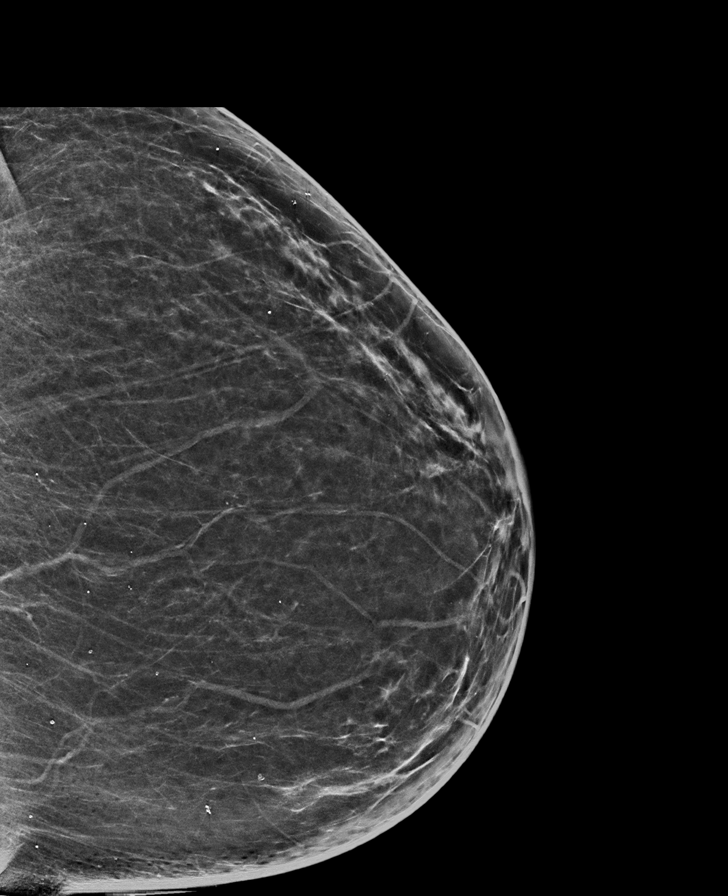

[R MLO]
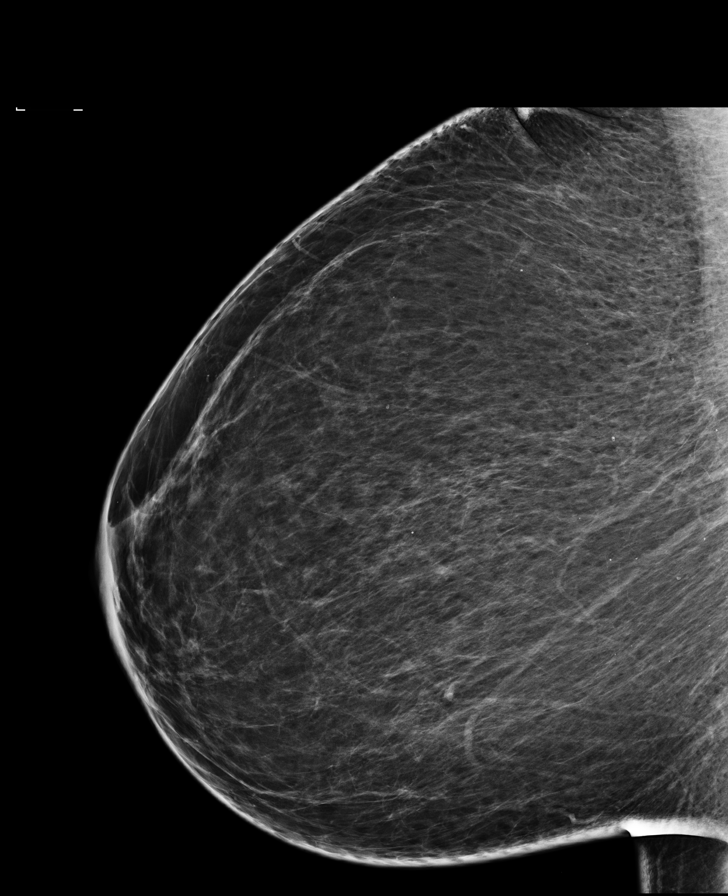

[L MLO (2 of 2)]
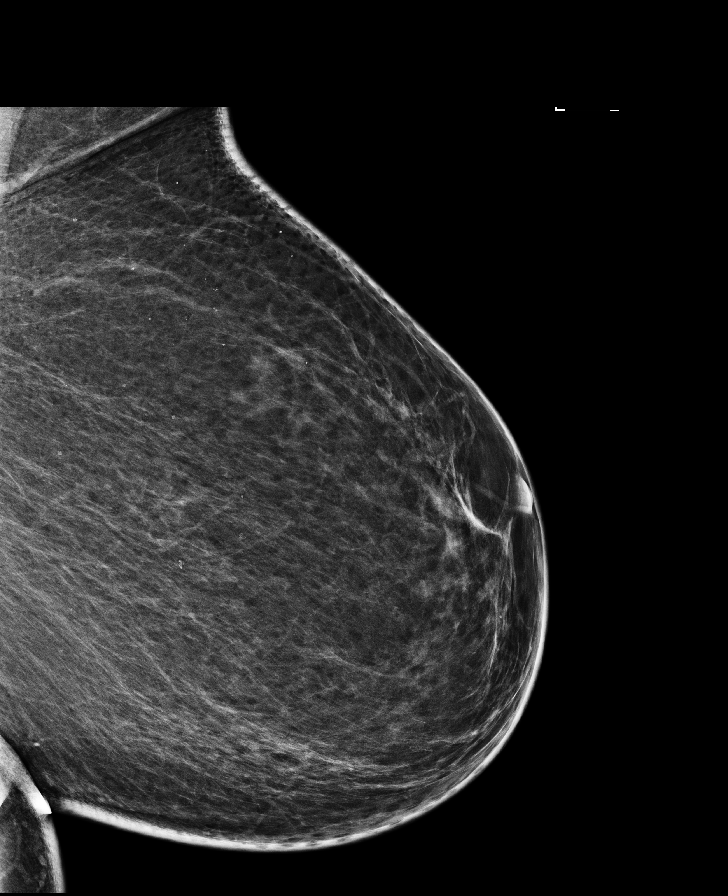

[R CC synth-2D]
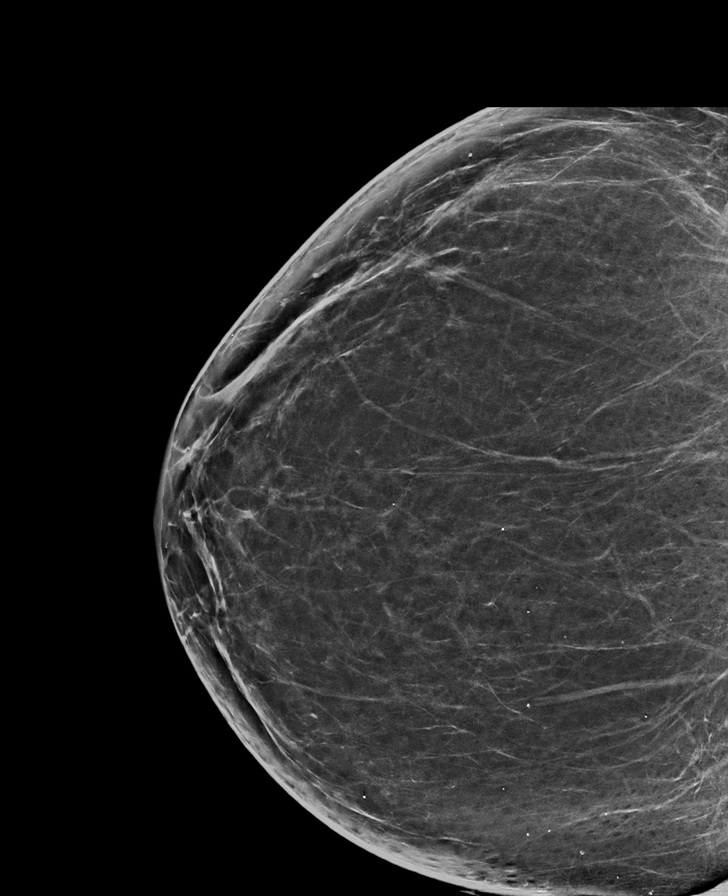

[8 of 31 positions shown; findings below may reference images not displayed]

FINDINGS: There are no findings suspicious for malignancy. Images were
processed with CAD.
IMPRESSION: No mammographic evidence of malignancy. A result letter of this
screening mammogram will be mailed directly to the patient.

RECOMMENDATION:
Screening mammogram in one year. (Code:8Y-Q-VVS)

BI-RADS CATEGORY  1: Negative.

## 2019-01-07 ENCOUNTER — Telehealth: Payer: Self-pay | Admitting: Pharmacist

## 2019-01-07 NOTE — Telephone Encounter (Signed)
01/07/2019 4:08:34 PM - Lantus Solostar faxed to [employer] 12/25/2018  01/07/2019 On 12/25/2018 I faxed [employer] page 1 of application due to provider name changed, and refill request for Lantus Solostar Inject 40 units daily at bedtime #4.AJ  01/07/2019 4:07:23 PM - Victoza & tips faxed to [employer]  01/07/2019 [employer] application for [employer] 1.8mg  under the skin every morning #4 & Novofine 32G tips #2 for enrollment.Delos Haring

## 2019-01-20 ENCOUNTER — Other Ambulatory Visit: Payer: Self-pay

## 2019-01-20 DIAGNOSIS — E119 Type 2 diabetes mellitus without complications: Secondary | ICD-10-CM

## 2019-01-20 MED ORDER — GLIPIZIDE 10 MG PO TABS: ORAL_TABLET | ORAL | 0 refills | Status: DC

## 2019-01-27 ENCOUNTER — Ambulatory Visit: Payer: Self-pay | Admitting: Student

## 2019-01-27 ENCOUNTER — Other Ambulatory Visit: Payer: Self-pay

## 2019-01-27 DIAGNOSIS — E1169 Type 2 diabetes mellitus with other specified complication: Secondary | ICD-10-CM

## 2019-01-27 DIAGNOSIS — H538 Other visual disturbances: Secondary | ICD-10-CM

## 2019-01-27 DIAGNOSIS — E1142 Type 2 diabetes mellitus with diabetic polyneuropathy: Secondary | ICD-10-CM

## 2019-01-27 DIAGNOSIS — E785 Hyperlipidemia, unspecified: Secondary | ICD-10-CM

## 2019-01-27 NOTE — Progress Notes (Signed)
Follow up Diabetes/ Endocrine [facility]     Patient ID: [name], female   DOB: 04/20/1976, 43 y.o.   MRN: [id number] Assessment:  [name] is a 43 y.o. female who is seen in follow up for T2DM at the request of Doles-Johnson, Teah, NP.  Encounter Diagnoses No diagnosis found.  Assessment      Plan:   1). Visit with ophthalmology as soon as possible.      2).  Increase Lantus to 45 units from 42. Aim for fasting glucose between 100-150mg . If fasting glucose is >150 for 5 days in a row, increase Lantus by 2 units.  3). Labs: A1c, microalbumin creatinine ratio.  4). Follow up in 3 months  5). Consider SGLT2 inhibitor in the future.    There are no Patient Instructions on file for this visit.   No orders of the defined types were placed in this encounter.    Subjective:  Ms. [name] is a 43yo woman presenting for a follow-up appoint for T2DM. Today she reports feeling "pretty good except for my eye sight". [facility], at which point her A1c was 10.9 and blood glucose were in the 250s in the morning and 250-320 at night.   Since her visit in June, she has presented to the ED twice, once for migraines and once for a refill of Gabapentin. Patient reports migraine episodes 2-3x/week, lasting at least an hour, usually in the morning when she awakes or before going to bed. They are located at the base of her skull and sometimes throbbing on the right side. They are usually accompanied with nausea. She was given a medicine to take 2x/week and takes Acetominophen for relief. She says they are different from a tension headache and feel like her eyes are being pulled.  Regarding her eyes, she describes ongoing episodes where her eyes go out of focus and she has fuzzy vision. No pain, discharge, or redness noted. Last ophthalmology visit was sometime last year. Patient uses a computer during the day for personal use. Patient reports an  appointment with ophtho in February 2021.  Regarding neuropathy, patient reports ongoing numbness of arms and legs. These episodes haven't changed since the last visit. She notes her medication was switched from Gabapentin to Duloxetine, but does not note a difference in the numbness but has helped with bowel distress.  At this visit she reports her morning BGs have been around 170, maybe lower. Lowest blood sugar reported recently was 98. She has been taking meds as prescribed, Lantus currently at 42 units. 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    Review of Systems  Eyes: Positive for visual disturbance.  Cardiovascular: Positive for leg swelling.  Gastrointestinal: Positive for nausea.  Endocrine: Negative for polyuria.  Neurological: Positive for dizziness and headaches.    [name]  has a past medical history of Diabetes mellitus without complication (HCC), GERD (gastroesophageal reflux disease), Headache, Hypertension, and Neuromuscular disorder (HCC).  Family History, Social History, current Medications and allergies reviewed and updated in Epic.  Objective:    There were no vitals taken for this visit. Physical Exam      Data : I have personally reviewed pertinent labs and imaging studies, if indicated,  with the patient in clinic today.   Lab Orders  No laboratory test(s) ordered today    HC Readings from Last 3 Encounters:  No data found for [facility]    Wt Readings from Last 3 Encounters:  12/21/18 280 lb (127 kg)  11/07/18 280 lb (127 kg)  08/01/18 278 lb (126.1 kg)

## 2019-01-27 NOTE — Patient Instructions (Signed)
-   Please increase your insulin to 45 units. - If your blood sugar over the next week continue to be >150, please increase your insulin by 2U (47U). - Continue to increase until morning blood sugar is consistently <150. - please see your eye doctor asap.

## 2019-02-05 ENCOUNTER — Ambulatory Visit: Payer: Self-pay | Admitting: Pharmacist

## 2019-02-05 ENCOUNTER — Other Ambulatory Visit: Payer: Self-pay

## 2019-02-05 DIAGNOSIS — Z79899 Other long term (current) drug therapy: Secondary | ICD-10-CM

## 2019-02-05 NOTE — Progress Notes (Signed)
Medication Management Clinic Visit Note  Patient: [name] MRN: [id number] Date of Birth: 1976-03-10 PCP: Jacelyn Pi, NP   [name] 43 y.o. female was contacted via telephone today for her medication therapy management review. She was identified using two identifiers.  There were no vitals taken for this visit.  Patient Information   Past Medical History:  Diagnosis Date  . Diabetes mellitus without complication (HCC)   . GERD (gastroesophageal reflux disease)   . Headache   . Hypertension   . Neuromuscular disorder [facility])       Past Surgical History:  Procedure Laterality Date  . CHOLECYSTECTOMY  1998     Family History  Problem Relation Age of Onset  . Kidney failure Father   . Pancreatic cancer Maternal Grandmother     New Diagnoses (since last visit): none  Family Support: Good  Lifestyle Diet: Breakfast: usually skips,  Lunch: usually skips, fried green tomatoes, oatmeal, eggs, bologna Dinner: pot roast, meatloaf, squash, spaghetti Drinks: some water, some soda, fruit juice            Social History   Substance and Sexual Activity  Alcohol Use No      Social History   Tobacco Use  Smoking Status Never Smoker  Smokeless Tobacco Never Used      Health Maintenance  Topic Date Due  . PNEUMOCOCCAL POLYSACCHARIDE VACCINE AGE 55-64 HIGH RISK  08/22/1975  . FOOT EXAM  08/22/1983  . HIV Screening  08/21/1988  . TETANUS/TDAP  08/21/1992  . PAP SMEAR-Modifier  08/22/1994  . OPHTHALMOLOGY EXAM  07/04/2018  . INFLUENZA VACCINE  12/13/2018  . URINE MICROALBUMIN  01/22/2019  . HEMOGLOBIN A1C  05/07/2019    Outpatient Encounter Medications as of 02/05/2019  Medication Sig  . acetaminophen (TYLENOL) 500 MG tablet Take 500 mg by mouth as needed.  Marland Kitchen atorvastatin (LIPITOR) 40 MG tablet Take 1 tablet (40 mg total) by mouth daily.  . DULoxetine (CYMBALTA) 20 MG capsule Take 20 mg by mouth daily.  Marland Kitchen glipiZIDE (GLUCOTROL)  10 MG tablet TAKE ONE TABLET BY MOUTH 2 TIMES A DAY BEFORE A MEAL  . insulin glargine (LANTUS) 100 UNIT/ML injection Inject up to 60units every night.  Start with 39u and increase by 3 units every 3 days until blood sugar in the morning is less than 150. (Patient taking differently: Inject up to 60units every night.  Start with 39u and increase by 3 units every 3 days until blood sugar in the morning is less than 150. Current dose is 45 units as of 02/05/19)  . liraglutide (VICTOZA) 18 MG/3ML SOPN Inject 0.3 mLs (1.8 mg total) into the skin every morning.  . Magnesium 250 MG TABS Take 500 mg by mouth daily. Migraines  . vitamin B-12 (CYANOCOBALAMIN) 1000 MCG tablet Take 1,000 mcg by mouth daily.  . Vitamin D, Ergocalciferol, (DRISDOL) 1.25 MG (50000 UT) CAPS capsule Take 1 capsule (50,000 Units total) by mouth every 7 (seven) days. (Patient not taking: Reported on 02/05/2019)  . [DISCONTINUED] gabapentin (NEURONTIN) 100 MG capsule Take 1 capsule (100 mg total) by mouth 3 (three) times daily.  . [DISCONTINUED] naproxen (NAPROSYN) 500 MG tablet Take 1 tablet (500 mg total) by mouth 2 (two) times daily with a meal.   No facility-administered encounter medications on file as of 02/05/2019.    Health Maintenance/Date Completed  Last ED visit: 12/21/18 Last Visit to PCP: 01/27/19 Next Visit to PCP: 02/12/19 Specialist Visit: 12/24/18 (Dr. Steele Sizer) Dental Exam: none  recent Eye Exam: 2019 Pelvic/PAP Exam: none recent Mammogram: none recent DEXA: none Colonoscopy: none Flu Vaccine: none Pneumonia Vaccine: none    Assessment:  Medication Access and Adherence [facility] is providing all medications except OTCs.  Diabetes - Lantus, glipizide, Victoza Checks blood sugar twice daily. Morning blood sugars range from 180-190. Evening blood sugar ranges from 200-225. Her lowest reading was 98. She is using 45 units of the Lantus. Patient states she takes the evening glipizide at 7:30pm.  Discussed importance of taking glipizide 30 minutes before a meal to maximize the benefits. Last A1c = 10.9 on 11/05/18. She is not exercising as she has no energy or motivation. She conveyed that she typically does not get up until late in the day and her meal times vary.  Neuropathy - duloxetine She is experiencing numbness in her arms, hands and the joints. She saw Dr. Manuella Ghazi in August for neuropathy. Duloxetine was started and gabapentin was discontinued to due the side effects of GERD and diarrhea. She has not noticed any improvement in the numbness or neuropathy.  Migraines - sumatriptan, acetaminophen, magnesium Seeing Dr. Manuella Ghazi. Migraines occur daily and have not improved with the duloxetine. Also using sumatriptan 2 times per week and acetaminophen. She was also started on B12 and magnesium for her energy and migraines.  Hyperlipidemia - atorvastatin TC = 140 mg/dl; TG = 142 mg/dl; HDL = 31 mg/dl; LDL = 81 mg/dl.   Rash/Itching - hydrocortisone cream Complains of intense itching with a rash. Patient suspects it is from either the Lantus or Victoza. The rash may occur after the Lantus injection in the evening or after the Victoza injection in the morning.   Plan: Encouraged patient to go to park to exercise for 10-15 minutes 2 times a week. Encouraged patient to call Dr. Manuella Ghazi and discuss current therapy for migraines since no improvement. Will submit refill request on Vitamin D. Pharmacist consulted CMA with [facility] regarding rash and itching; pt has an appointment scheduled for 02/12/19; CMA will contact patient. Pharmacist to mail info to patient for the [facility] 2 PAP smear and mammogram. Return for MTM in 6 months.  Dhruv Christina K. Dicky Doe, PharmD [facility] [place] Operations Coordinator [phone]

## 2019-02-11 ENCOUNTER — Other Ambulatory Visit: Payer: Self-pay

## 2019-02-11 ENCOUNTER — Other Ambulatory Visit: Payer: Self-pay | Admitting: Gerontology

## 2019-02-11 DIAGNOSIS — E1140 Type 2 diabetes mellitus with diabetic neuropathy, unspecified: Secondary | ICD-10-CM

## 2019-02-11 DIAGNOSIS — E1142 Type 2 diabetes mellitus with diabetic polyneuropathy: Secondary | ICD-10-CM

## 2019-02-11 DIAGNOSIS — E118 Type 2 diabetes mellitus with unspecified complications: Secondary | ICD-10-CM

## 2019-02-11 DIAGNOSIS — E785 Hyperlipidemia, unspecified: Secondary | ICD-10-CM

## 2019-02-11 DIAGNOSIS — E1165 Type 2 diabetes mellitus with hyperglycemia: Secondary | ICD-10-CM

## 2019-02-11 DIAGNOSIS — E1169 Type 2 diabetes mellitus with other specified complication: Secondary | ICD-10-CM

## 2019-02-12 ENCOUNTER — Ambulatory Visit: Payer: Self-pay | Admitting: Urology

## 2019-02-12 VITALS — BP 118/67 | HR 78 | Ht 66.0 in | Wt 274.7 lb

## 2019-02-12 DIAGNOSIS — E118 Type 2 diabetes mellitus with unspecified complications: Secondary | ICD-10-CM

## 2019-02-12 DIAGNOSIS — E1140 Type 2 diabetes mellitus with diabetic neuropathy, unspecified: Secondary | ICD-10-CM

## 2019-02-12 DIAGNOSIS — R21 Rash and other nonspecific skin eruption: Secondary | ICD-10-CM

## 2019-02-12 DIAGNOSIS — E785 Hyperlipidemia, unspecified: Secondary | ICD-10-CM

## 2019-02-12 DIAGNOSIS — E1165 Type 2 diabetes mellitus with hyperglycemia: Secondary | ICD-10-CM

## 2019-02-12 DIAGNOSIS — E1169 Type 2 diabetes mellitus with other specified complication: Secondary | ICD-10-CM

## 2019-02-12 LAB — COMPREHENSIVE METABOLIC PANEL
ALT: 12 IU/L (ref 0–32)
AST: 11 IU/L (ref 0–40)
Albumin/Globulin Ratio: 1.4 (ref 1.2–2.2)
Albumin: 4.2 g/dL (ref 3.8–4.8)
Alkaline Phosphatase: 123 IU/L — ABNORMAL HIGH (ref 39–117)
BUN/Creatinine Ratio: 13 (ref 9–23)
BUN: 11 mg/dL (ref 6–24)
Bilirubin Total: 0.4 mg/dL (ref 0.0–1.2)
CO2: 22 mmol/L (ref 20–29)
Calcium: 9.4 mg/dL (ref 8.7–10.2)
Chloride: 99 mmol/L (ref 96–106)
Creatinine, Ser: 0.83 mg/dL (ref 0.57–1.00)
GFR calc Af Amer: 98 mL/min/{1.73_m2} (ref >59)
GFR calc non Af Amer: 85 mL/min/{1.73_m2} (ref >59)
Globulin, Total: 3 g/dL (ref 1.5–4.5)
Glucose: 362 mg/dL — ABNORMAL HIGH (ref 65–99)
Potassium: 4.1 mmol/L (ref 3.5–5.2)
Sodium: 136 mmol/L (ref 134–144)
Total Protein: 7.2 g/dL (ref 6.0–8.5)

## 2019-02-12 LAB — HEMOGLOBIN A1C
Est. average glucose Bld gHb Est-mCnc: 217 mg/dL
Hgb A1c MFr Bld: 9.2 % — ABNORMAL HIGH (ref 4.8–5.6)

## 2019-02-12 MED ORDER — LIRAGLUTIDE 18 MG/3ML ~~LOC~~ SOPN: 1.8000 mg | PEN_INJECTOR | Freq: Every morning | SUBCUTANEOUS | 3 refills | Status: DC

## 2019-02-12 MED ORDER — JARDIANCE 10 MG PO TABS: 10.0000 mg | ORAL_TABLET | Freq: Every day | ORAL | 3 refills | Status: DC

## 2019-02-12 MED ORDER — VITAMIN D (ERGOCALCIFEROL) 1.25 MG (50000 UNIT) PO CAPS: 50000.0000 [IU] | ORAL_CAPSULE | ORAL | 0 refills | Status: DC

## 2019-02-12 NOTE — Progress Notes (Signed)
Patient: [name] Female    DOB: 1975/06/22   43 y.o.   MRN: [id number] Visit Date: 02/12/2019  Today's Provider: [place]   Chief Complaint  Patient presents with  . Rash    Usually when takes lantis or victoza is when it's the worst  . Joint Swelling    In hands, lower part of legs, and arms. Especially in arms and hands. Pain in arms and hands. Has been ongoing almost two months and had gotten worse in this period. Tried Gabapentin for neuropathy, but didn't help. Put on Duloxitine.    Subjective:    HPI  She states that two weeks ago she started to experiencing symptoms of warm skin, then itching with tiny bumps and then they turn to hives and then disappear and then reappear.  This only happens on her shoulders and down the outside of her arms.  No other symptoms happen with the rash.  No pets. No outside activities.  No changing in house hold products.  No new medications.  No new vitamins.  She has been putting on Vaseline mixed with peppermint oil to stave off mosquito's.    Per endocrinology note "would start SGLT2i at next visit in person. She may need reduction of insulin with start of SGLT2i depending on glycemic control. Would also consider stopping glipizide altogether, as it is unlikely helping and adds to pill burden."      Allergies  Allergen Reactions  . Penicillins Hives  . Topiramate Er     Worsened migraines, N/V   Previous Medications   ACETAMINOPHEN (TYLENOL) 500 MG TABLET    Take 500 mg by mouth as needed.   ATORVASTATIN (LIPITOR) 40 MG TABLET    Take 1 tablet (40 mg total) by mouth daily.   DULOXETINE (CYMBALTA) 20 MG CAPSULE    Take 20 mg by mouth daily.   GLIPIZIDE (GLUCOTROL) 10 MG TABLET    TAKE ONE TABLET BY MOUTH 2 TIMES A DAY BEFORE A MEAL   HYDROCORTISONE CREAM 1 %    Apply 1 application topically as needed for itching. Rash on arms   INSULIN GLARGINE (LANTUS) 100 UNIT/ML INJECTION    Inject up to 60units every night.   Start with 39u and increase by 3 units every 3 days until blood sugar in the morning is less than 150.   MAGNESIUM 250 MG TABS    Take 500 mg by mouth daily. Migraines   VITAMIN B-12 (CYANOCOBALAMIN) 1000 MCG TABLET    Take 1,000 mcg by mouth daily.    Review of Systems  Social History   Tobacco Use  . Smoking status: Never Smoker  . Smokeless tobacco: Never Used  Substance Use Topics  . Alcohol use: No    Frequency: Never   Objective:   BP 118/67   Pulse 78   Ht 5\' 6"  (1.676 m) Comment: self-reported  Wt 274 lb 11.2 oz (124.6 kg)   SpO2 100%   BMI 44.34 kg/m   Physical Exam      Assessment & Plan:   1. Allergy  - stop the Vaseline/peppermint and see if rash clears  - fill out charity care forms for dermatology referral   2. DM  - start Jardiance 10 mg daily  - stop glipizide 10 mg   - continue the Victoza and Lantus and check sugars BID  - RTC in one month   3. Neuropathy  - will contact Dr. Manuella Ghazi regarding the ineffectiveness of the Cymbalta  [facility]

## 2019-02-16 ENCOUNTER — Telehealth: Payer: Self-pay | Admitting: Pharmacist

## 2019-02-16 NOTE — Telephone Encounter (Signed)
02/16/2019 9:11:16 AM - Vania Rea New Med to pat & dr  02/16/2019 I have received a pharmacy printout for new med-Jardiance 10mg  Take 1 tablet (10mg ) by mouth every day, printed [employer] application--Mailing patient her portion to sign & return with form also giving BI permission to ship medication to [place], also sending provider portion to [facility] for Shannon to sign & return.Delos Haring

## 2019-03-05 ENCOUNTER — Telehealth: Payer: Self-pay | Admitting: Pharmacist

## 2019-03-05 NOTE — Telephone Encounter (Signed)
03/05/2019 9:49:53 AM - Vania Rea pending pat. to return paperwork  03/05/2019 I have received the provider portion of Jardiance application, holding for patient to return her portion, mailed to patient 02/16/2019.Delos Haring

## 2019-03-19 ENCOUNTER — Other Ambulatory Visit: Payer: Self-pay

## 2019-03-19 ENCOUNTER — Encounter: Payer: Self-pay | Admitting: Gerontology

## 2019-03-19 ENCOUNTER — Ambulatory Visit: Payer: Self-pay | Admitting: Gerontology

## 2019-03-19 VITALS — BP 131/71 | HR 84 | Temp 99.1°F | Ht 66.5 in | Wt 275.0 lb

## 2019-03-19 DIAGNOSIS — Z0000 Encounter for general adult medical examination without abnormal findings: Secondary | ICD-10-CM

## 2019-03-19 DIAGNOSIS — R21 Rash and other nonspecific skin eruption: Secondary | ICD-10-CM

## 2019-03-19 DIAGNOSIS — G629 Polyneuropathy, unspecified: Secondary | ICD-10-CM

## 2019-03-19 DIAGNOSIS — Z794 Long term (current) use of insulin: Secondary | ICD-10-CM

## 2019-03-19 DIAGNOSIS — E1142 Type 2 diabetes mellitus with diabetic polyneuropathy: Secondary | ICD-10-CM

## 2019-03-19 MED ORDER — INSULIN GLARGINE 100 UNIT/ML ~~LOC~~ SOLN: SUBCUTANEOUS | 11 refills | Status: DC

## 2019-03-19 MED ORDER — GABAPENTIN 300 MG PO CAPS: 300.0000 mg | ORAL_CAPSULE | Freq: Every day | ORAL | 0 refills | Status: DC

## 2019-03-19 NOTE — Progress Notes (Signed)
Established Patient Office Visit  Subjective:  Patient ID: [name], female    DOB: 1975/06/22  Age: 43 y.o. MRN: [id number]  CC:  Chief Complaint  Patient presents with  . Diabetes    HPI [name] presents for follow up of type 2 diabetes mellitus, neuropathy and rash. She reports that her Neuropathic pain is not relieved with taking Cymbalta 20 mg daily. She reports that she stopped taking Cymbalta couple of days ago because she researched that it could cause forgetfulness, which she experiences sometimes. She continues to experience neuropathic pain to her fingers, stating that it burns when she's sleeping. Her HgbA1c done one month ago was 9.2%, she states that she didn't take Jardiance after doing some research, she states that the side effect could worsen her health conditions such as lower back pain, numbness and forgetfulness. She takes her blood glucose twice daily, she didn't bring her log, and states that her fasting blood glucose has being in the 170's for the past one week. Her blood glucose was checked during visit and it was 210 mg/dl. She denies hypoglycemic symptoms and states that she experiences polyuria. She is compliant with her medications, takes 51 units of Lantus and reports that she was told not to increase by 3 units every 3 days if her fasting glucose is greater than 150 mg/dl. She states that she's exercising more and working on her diet. She reports that she continues to experience intermittent scattered light pinkish, sometimes pruritic rash to her upper arm, but it resolves within a day. The picture of the rash she took with her phone was not clear. She denies changing any soap, and body cream. She states that she's doing well and denies any further complaint.  Past Medical History:  Diagnosis Date  . Diabetes mellitus without complication ([place])   . GERD (gastroesophageal reflux disease)   . Headache   . Hyperlipidemia   . Hypertension    . Neuromuscular disorder [facility])     Past Surgical History:  Procedure Laterality Date  . CHOLECYSTECTOMY  1998    Family History  Problem Relation Age of Onset  . Kidney failure Father   . Pancreatic cancer Maternal Grandmother     Social History   Socioeconomic History  . Marital status: Single    Spouse name: Not on file  . Number of children: Not on file  . Years of education: Not on file  . Highest education level: Not on file  Occupational History  . Not on file  Social Needs  . Financial resource strain: Not very hard  . Food insecurity    Worry: Never true    Inability: Never true  . Transportation needs    Medical: No    Non-medical: No  Tobacco Use  . Smoking status: Never Smoker  . Smokeless tobacco: Never Used  Substance and Sexual Activity  . Alcohol use: No    Frequency: Never  . Drug use: No  . Sexual activity: Never  Lifestyle  . Physical activity    Days per week: 0 days    Minutes per session: 0 min  . Stress: Not at all  Relationships  . Social connections    Talks on phone: More than three times a week    Gets together: More than three times a week    Attends religious service: More than 4 times per year    Active member of club or organization: No    Attends meetings  of clubs or organizations: Never    Relationship status: Not on file  . Intimate partner violence    Fear of current or ex partner: No    Emotionally abused: No    Physically abused: No    Forced sexual activity: No  Other Topics Concern  . Not on file  Social History Narrative   - Needs a blood sugar monitor       Patient said she is living with her mom and things are going ok. Mom provides for her food, transport, etc.       Patient wants to wait until next visit before any information is shared.     Outpatient Medications Prior to Visit  Medication Sig Dispense Refill  . acetaminophen (TYLENOL) 500 MG tablet Take 500 mg by mouth as needed.    Marland Kitchen. atorvastatin  (LIPITOR) 40 MG tablet Take 1 tablet (40 mg total) by mouth daily. 30 tablet 11  . glipiZIDE (GLUCOTROL) 10 MG tablet TAKE ONE TABLET BY MOUTH 2 TIMES A DAY BEFORE A MEAL 180 tablet 0  . liraglutide (VICTOZA) 18 MG/3ML SOPN Inject 0.3 mLs (1.8 mg total) into the skin every morning. 4 pen 3  . Magnesium 250 MG TABS Take 500 mg by mouth daily. Migraines    . vitamin B-12 (CYANOCOBALAMIN) 1000 MCG tablet Take 1,000 mcg by mouth daily.    . Vitamin D, Ergocalciferol, (DRISDOL) 1.25 MG (50000 UT) CAPS capsule Take 1 capsule (50,000 Units total) by mouth once a week. 5 capsule 0  . DULoxetine (CYMBALTA) 20 MG capsule Take 20 mg by mouth daily.    . insulin glargine (LANTUS) 100 UNIT/ML injection Inject up to 60units every night.  Start with 39u and increase by 3 units every 3 days until blood sugar in the morning is less than 150. (Patient taking differently: Inject up to 60units every night.  Start with 39u and increase by 3 units every 3 days until blood sugar in the morning is less than 150. Current dose is 45 units as of 02/05/19) 10 mL 11  . hydrocortisone cream 1 % Apply 1 application topically as needed for itching. Rash on arms    . empagliflozin (JARDIANCE) 10 MG TABS tablet Take 10 mg by mouth daily. (Patient not taking: Reported on 03/19/2019) 30 tablet 3   No facility-administered medications prior to visit.     Allergies  Allergen Reactions  . Penicillins Hives  . Topiramate Er     Worsened migraines, N/V    ROS Review of Systems  Constitutional: Negative.   Respiratory: Negative.   Cardiovascular: Negative.   Endocrine: Positive for polyuria.  Skin: Positive for rash.  Neurological: Positive for numbness.  Psychiatric/Behavioral: Negative.       Objective:    Physical Exam  Constitutional: She is oriented to person, place, and time. She appears well-developed.  Obese  HENT:  Head: Normocephalic and atraumatic.  Eyes: Pupils are equal, round, and reactive to light. EOM  are normal.  Cardiovascular: Normal rate and regular rhythm.  Pulmonary/Chest: Effort normal and breath sounds normal.  Neurological: She is alert and oriented to person, place, and time.  Skin: Skin is warm and dry.  Psychiatric: She has a normal mood and affect. Her behavior is normal. Judgment and thought content normal.    BP 131/71 (BP Location: Right Arm, Patient Position: Sitting)   Pulse 84   Temp 99.1 F (37.3 C)   Ht 5' 6.5" (1.689 m)   Wt 275 lb (  124.7 kg)   SpO2 95%   BMI 43.72 kg/m  Wt Readings from Last 3 Encounters:  03/19/19 275 lb (124.7 kg)  02/12/19 274 lb 11.2 oz (124.6 kg)  12/21/18 280 lb (127 kg)   She was encouraged to continue on her weight loss regimen.  Health Maintenance Due  Topic Date Due  . PNEUMOCOCCAL POLYSACCHARIDE VACCINE AGE 44-64 HIGH RISK  08/22/1975  . FOOT EXAM  08/22/1983  . HIV Screening  08/21/1988  . TETANUS/TDAP  08/21/1992  . PAP SMEAR-Modifier  08/22/1994  . OPHTHALMOLOGY EXAM  07/04/2018  . URINE MICROALBUMIN  01/22/2019    There are no preventive care reminders to display for this patient.  Lab Results  Component Value Date   TSH 2.200 01/16/2018   Lab Results  Component Value Date   WBC 11.1 (H) 04/17/2018   HGB 10.3 (L) 04/17/2018   HCT 34.0 04/17/2018   MCV 74 (L) 04/17/2018   PLT 429 04/17/2018   Lab Results  Component Value Date   NA 136 02/11/2019   K 4.1 02/11/2019   CO2 22 02/11/2019   GLUCOSE 362 (H) 02/11/2019   BUN 11 02/11/2019   CREATININE 0.83 02/11/2019   BILITOT 0.4 02/11/2019   ALKPHOS 123 (H) 02/11/2019   AST 11 02/11/2019   ALT 12 02/11/2019   PROT 7.2 02/11/2019   ALBUMIN 4.2 02/11/2019   CALCIUM 9.4 02/11/2019   ANIONGAP 12 07/30/2017   Lab Results  Component Value Date   CHOL 140 11/05/2018   Lab Results  Component Value Date   HDL 31 (L) 11/05/2018   Lab Results  Component Value Date   LDLCALC 81 11/05/2018   Lab Results  Component Value Date   TRIG 142 11/05/2018    Lab Results  Component Value Date   CHOLHDL 4.5 (H) 11/05/2018   Lab Results  Component Value Date   HGBA1C 9.2 (H) 02/11/2019      Assessment & Plan:    1. Type 2 diabetes mellitus with diabetic polyneuropathy, with long-term current use of insulin (HCC) - Her Diabetes is uncontrolled due to non compliance with diet. Her goal HgbA1c is < 7%. She was advised to check fasting and and pre dinner, record and bring log to follow up appointment. She was advised to start taking 54 units of Lantus and increase it by 3 units every 3 days, and her fasting blood glucose reading should be between 80-130 mg/dl. She educated on diabetic diet, reading food label etc. - insulin glargine (LANTUS) 100 UNIT/ML injection; Inject up to 51 units every night, increase by 3 units every 3 days until blood sugar in the morning is between 80-130 mg/dl.  Dispense: 10 mL; Refill: 11  2. Peripheral polyneuropathy - She will continue on 300 mg qhs and was advised to complete charity care application for Neurology referral. - gabapentin (NEURONTIN) 300 MG capsule; Take 1 capsule (300 mg total) by mouth at bedtime.  Dispense: 30 capsule; Refill: 0 - Ambulatory referral to Neurology  3. Health care maintenance  - Ambulatory referral to Hematology / Oncology for Mammogram and Pap smear.  4. Rash - She was advised to notify clinic for worsening rash and take picture whenever the rash erupts.    Follow-up: Return in about 1 month (around 04/18/2019), or if symptoms worsen or fail to improve.    Tong Pieczynski Trellis Paganini, NP

## 2019-03-19 NOTE — Patient Instructions (Signed)
Carbohydrate Counting for Diabetes Mellitus, Adult  Carbohydrate counting is a method of keeping track of how many carbohydrates you eat. Eating carbohydrates naturally increases the amount of sugar (glucose) in the blood. Counting how many carbohydrates you eat helps keep your blood glucose within normal limits, which helps you manage your diabetes (diabetes mellitus). It is important to know how many carbohydrates you can safely have in each meal. This is different for every person. A diet and nutrition specialist (registered dietitian) can help you make a meal plan and calculate how many carbohydrates you should have at each meal and snack. Carbohydrates are found in the following foods:  Grains, such as breads and cereals.  Dried beans and soy products.  Starchy vegetables, such as potatoes, peas, and corn.  Fruit and fruit juices.  Milk and yogurt.  Sweets and snack foods, such as cake, cookies, candy, chips, and soft drinks. How do I count carbohydrates? There are two ways to count carbohydrates in food. You can use either of the methods or a combination of both. Reading "Nutrition Facts" on packaged food The "Nutrition Facts" list is included on the labels of almost all packaged foods and beverages in the [place] It includes:  The serving size.  Information about nutrients in each serving, including the grams (g) of carbohydrate per serving. To use the "Nutrition Facts":  Decide how many servings you will have.  Multiply the number of servings by the number of carbohydrates per serving.  The resulting number is the total amount of carbohydrates that you will be having. Learning standard serving sizes of other foods When you eat carbohydrate foods that are not packaged or do not include "Nutrition Facts" on the label, you need to measure the servings in order to count the amount of carbohydrates:  Measure the foods that you will eat with a food scale or measuring cup, if needed.   Decide how many standard-size servings you will eat.  Multiply the number of servings by 15. Most carbohydrate-rich foods have about 15 g of carbohydrates per serving. ? For example, if you eat 8 oz (170 g) of strawberries, you will have eaten 2 servings and 30 g of carbohydrates (2 servings x 15 g = 30 g).  For foods that have more than one food mixed, such as soups and casseroles, you must count the carbohydrates in each food that is included. The following list contains standard serving sizes of common carbohydrate-rich foods. Each of these servings has about 15 g of carbohydrates:   hamburger bun or  English muffin.   oz (15 mL) syrup.   oz (14 g) jelly.  1 slice of bread.  1 six-inch tortilla.  3 oz (85 g) cooked rice or pasta.  4 oz (113 g) cooked dried beans.  4 oz (113 g) starchy vegetable, such as peas, corn, or potatoes.  4 oz (113 g) hot cereal.  4 oz (113 g) mashed potatoes or  of a large baked potato.  4 oz (113 g) canned or frozen fruit.  4 oz (120 mL) fruit juice.  4-6 crackers.  6 chicken nuggets.  6 oz (170 g) unsweetened dry cereal.  6 oz (170 g) plain fat-free yogurt or yogurt sweetened with artificial sweeteners.  8 oz (240 mL) milk.  8 oz (170 g) fresh fruit or one small piece of fruit.  24 oz (680 g) popped popcorn. Example of carbohydrate counting Sample meal  3 oz (85 g) chicken breast.  6 oz (170 g)   brown rice.  4 oz (113 g) corn.  8 oz (240 mL) milk.  8 oz (170 g) strawberries with sugar-free whipped topping. Carbohydrate calculation 1. Identify the foods that contain carbohydrates: ? Rice. ? Corn. ? Milk. ? Strawberries. 2. Calculate how many servings you have of each food: ? 2 servings rice. ? 1 serving corn. ? 1 serving milk. ? 1 serving strawberries. 3. Multiply each number of servings by 15 g: ? 2 servings rice x 15 g = 30 g. ? 1 serving corn x 15 g = 15 g. ? 1 serving milk x 15 g = 15 g. ? 1 serving  strawberries x 15 g = 15 g. 4. Add together all of the amounts to find the total grams of carbohydrates eaten: ? 30 g + 15 g + 15 g + 15 g = 75 g of carbohydrates total. Summary  Carbohydrate counting is a method of keeping track of how many carbohydrates you eat.  Eating carbohydrates naturally increases the amount of sugar (glucose) in the blood.  Counting how many carbohydrates you eat helps keep your blood glucose within normal limits, which helps you manage your diabetes.  A diet and nutrition specialist (registered dietitian) can help you make a meal plan and calculate how many carbohydrates you should have at each meal and snack. This information is not intended to replace advice given to you by your health care provider. Make sure you discuss any questions you have with your health care provider. Document Released: 04/30/2005 Document Revised: 11/22/2016 Document Reviewed: 10/12/2015 Elsevier Patient Education  2020 Elsevier Inc.  

## 2019-04-28 ENCOUNTER — Other Ambulatory Visit: Payer: Self-pay

## 2019-04-28 ENCOUNTER — Ambulatory Visit: Payer: Self-pay

## 2019-04-30 ENCOUNTER — Ambulatory Visit: Payer: Self-pay | Admitting: Gerontology

## 2019-05-05 ENCOUNTER — Ambulatory Visit: Payer: Self-pay

## 2019-05-05 ENCOUNTER — Ambulatory Visit: Payer: Self-pay | Admitting: Gerontology

## 2019-05-05 NOTE — Progress Notes (Deleted)
Established Patient Office Visit  Subjective:  Patient ID: [name], female    DOB: Sep 23, 1975  Age: 43 y.o. MRN: [id number]  CC: No chief complaint on file.   HPI Taylan Erabella Kuipers presents for ***  Past Medical History:  Diagnosis Date  . Diabetes mellitus without complication ([place])   . GERD (gastroesophageal reflux disease)   . Headache   . History of nephrolithiasis 2015   via CT imaging, including an obstructing stone  . Hyperlipidemia   . Hypertension   . Neuromuscular disorder [facility])     Past Surgical History:  Procedure Laterality Date  . CHOLECYSTECTOMY  1998    Family History  Problem Relation Age of Onset  . Kidney failure Father   . Pancreatic cancer Maternal Grandmother     Social History   Socioeconomic History  . Marital status: Single    Spouse name: Not on file  . Number of children: Not on file  . Years of education: Not on file  . Highest education level: Not on file  Occupational History  . Not on file  Tobacco Use  . Smoking status: Never Smoker  . Smokeless tobacco: Never Used  Substance and Sexual Activity  . Alcohol use: No  . Drug use: No  . Sexual activity: Never  Other Topics Concern  . Not on file  Social History Narrative   - Needs a blood sugar monitor       Patient said she is living with her mom and things are going ok. Mom provides for her food, transport, etc.       Patient wants to wait until next visit before any information is shared.    Social Determinants of Health   Financial Resource Strain:   . Difficulty of Paying Living Expenses: Not on file  Food Insecurity:   . Worried About Charity fundraiser in the Last Year: Not on file  . Ran Out of Food in the Last Year: Not on file  Transportation Needs:   . Lack of Transportation (Medical): Not on file  . Lack of Transportation (Non-Medical): Not on file  Physical Activity:   . Days of Exercise per Week: Not on file  . Minutes of Exercise  per Session: Not on file  Stress:   . Feeling of Stress : Not on file  Social Connections:   . Frequency of Communication with Friends and Family: Not on file  . Frequency of Social Gatherings with Friends and Family: Not on file  . Attends Religious Services: Not on file  . Active Member of Clubs or Organizations: Not on file  . Attends Archivist Meetings: Not on file  . Marital Status: Not on file  Intimate Partner Violence:   . Fear of Current or Ex-Partner: Not on file  . Emotionally Abused: Not on file  . Physically Abused: Not on file  . Sexually Abused: Not on file    Outpatient Medications Prior to Visit  Medication Sig Dispense Refill  . acetaminophen (TYLENOL) 500 MG tablet Take 500 mg by mouth as needed.    Marland Kitchen atorvastatin (LIPITOR) 40 MG tablet Take 1 tablet (40 mg total) by mouth daily. 30 tablet 11  . gabapentin (NEURONTIN) 300 MG capsule Take 1 capsule (300 mg total) by mouth at bedtime. 30 capsule 0  . glipiZIDE (GLUCOTROL) 10 MG tablet TAKE ONE TABLET BY MOUTH 2 TIMES A DAY BEFORE A MEAL 180 tablet 0  . hydrocortisone cream 1 %  Apply 1 application topically as needed for itching. Rash on arms    . insulin glargine (LANTUS) 100 UNIT/ML injection Inject up to 51 units every night, increase by 3 units every 3 days until blood sugar in the morning is between 80-130 mg/dl. 10 mL 11  . liraglutide (VICTOZA) 18 MG/3ML SOPN Inject 0.3 mLs (1.8 mg total) into the skin every morning. 4 pen 3  . Magnesium 250 MG TABS Take 500 mg by mouth daily. Migraines    . vitamin B-12 (CYANOCOBALAMIN) 1000 MCG tablet Take 1,000 mcg by mouth daily.    . Vitamin D, Ergocalciferol, (DRISDOL) 1.25 MG (50000 UT) CAPS capsule Take 1 capsule (50,000 Units total) by mouth once a week. 5 capsule 0   No facility-administered medications prior to visit.    Allergies  Allergen Reactions  . Penicillins Hives  . Topiramate Er     Worsened migraines, N/V    ROS Review of Systems     Objective:    Physical Exam  There were no vitals taken for this visit. Wt Readings from Last 3 Encounters:  03/19/19 275 lb (124.7 kg)  02/12/19 274 lb 11.2 oz (124.6 kg)  12/21/18 280 lb (127 kg)     Health Maintenance Due  Topic Date Due  . PNEUMOCOCCAL POLYSACCHARIDE VACCINE AGE 49-64 HIGH RISK  08/22/1975  . FOOT EXAM  08/22/1983  . HIV Screening  08/21/1988  . TETANUS/TDAP  08/21/1992  . PAP SMEAR-Modifier  08/22/1994  . OPHTHALMOLOGY EXAM  07/04/2018  . URINE MICROALBUMIN  01/22/2019    There are no preventive care reminders to display for this patient.  Lab Results  Component Value Date   TSH 2.200 01/16/2018   Lab Results  Component Value Date   WBC 11.1 (H) 04/17/2018   HGB 10.3 (L) 04/17/2018   HCT 34.0 04/17/2018   MCV 74 (L) 04/17/2018   PLT 429 04/17/2018   Lab Results  Component Value Date   NA 136 02/11/2019   K 4.1 02/11/2019   CO2 22 02/11/2019   GLUCOSE 362 (H) 02/11/2019   BUN 11 02/11/2019   CREATININE 0.83 02/11/2019   BILITOT 0.4 02/11/2019   ALKPHOS 123 (H) 02/11/2019   AST 11 02/11/2019   ALT 12 02/11/2019   PROT 7.2 02/11/2019   ALBUMIN 4.2 02/11/2019   CALCIUM 9.4 02/11/2019   ANIONGAP 12 07/30/2017   Lab Results  Component Value Date   CHOL 140 11/05/2018   Lab Results  Component Value Date   HDL 31 (L) 11/05/2018   Lab Results  Component Value Date   LDLCALC 81 11/05/2018   Lab Results  Component Value Date   TRIG 142 11/05/2018   Lab Results  Component Value Date   CHOLHDL 4.5 (H) 11/05/2018   Lab Results  Component Value Date   HGBA1C 9.2 (H) 02/11/2019      Assessment & Plan:   Problem List Items Addressed This Visit    None      No orders of the defined types were placed in this encounter.   Follow-up: No follow-ups on file.    Tyra Michelle Trellis Paganini, NP

## 2019-05-13 ENCOUNTER — Other Ambulatory Visit: Payer: Self-pay

## 2019-05-26 ENCOUNTER — Telehealth: Payer: Self-pay | Admitting: Pharmacist

## 2019-05-26 NOTE — Telephone Encounter (Signed)
05/26/2019 2:14:30 PM - Cymbalta pending  05/26/2019 I have received signed [employer] application for Cymbalta back from provider--holding for patient to return her portion, mailed to patient 04/02/2019 & 05/04/2019.AJ  05/04/2019 12:29:43 PM - Cymbalta pending  05/04/2019 I had previously mailed forms to patient & provider Dr. Sherryll Burger @ [facility] to sign & return--I have not received back from either. I did receive a notice from [employer] stating they received an application missing information: Patient signature, POI, Prescription for requested medication. I am not sure what [employer] received or from whom. I have reprinted letters SECOND REQUEST to patient & provider to sign and return to our office.Forde Radon

## 2019-06-02 ENCOUNTER — Other Ambulatory Visit: Payer: Self-pay

## 2019-06-02 DIAGNOSIS — E119 Type 2 diabetes mellitus without complications: Secondary | ICD-10-CM

## 2019-06-02 MED ORDER — GLIPIZIDE 10 MG PO TABS: ORAL_TABLET | ORAL | 0 refills | Status: DC

## 2019-06-10 ENCOUNTER — Other Ambulatory Visit: Payer: Self-pay

## 2019-06-10 DIAGNOSIS — E119 Type 2 diabetes mellitus without complications: Secondary | ICD-10-CM

## 2019-06-11 ENCOUNTER — Telehealth: Payer: Self-pay | Admitting: Pharmacist

## 2019-06-11 LAB — MICROALBUMIN / CREATININE URINE RATIO
Creatinine, Urine: 159.5 mg/dL
Microalb/Creat Ratio: 41 mg/g creat — ABNORMAL HIGH (ref 0–29)
Microalbumin, Urine: 66.1 ug/mL

## 2019-06-11 NOTE — Telephone Encounter (Signed)
06/11/2019 11:09:52 AM - Lantus Vials refill to provider  06/11/2019 Sending Lantus Vials refill to [facility] Inject max 60 units into the skin once daily # 4.Forde Radon

## 2019-06-26 ENCOUNTER — Other Ambulatory Visit: Payer: Self-pay | Admitting: Gerontology

## 2019-06-30 ENCOUNTER — Other Ambulatory Visit: Payer: Self-pay

## 2019-06-30 ENCOUNTER — Ambulatory Visit: Payer: Self-pay | Admitting: Gerontology

## 2019-06-30 DIAGNOSIS — Z794 Long term (current) use of insulin: Secondary | ICD-10-CM

## 2019-06-30 DIAGNOSIS — E119 Type 2 diabetes mellitus without complications: Secondary | ICD-10-CM

## 2019-06-30 DIAGNOSIS — E1142 Type 2 diabetes mellitus with diabetic polyneuropathy: Secondary | ICD-10-CM

## 2019-06-30 LAB — POCT GLYCOSYLATED HEMOGLOBIN (HGB A1C): Hemoglobin A1C: 9.3 % — AB (ref 4.0–5.6)

## 2019-07-01 ENCOUNTER — Telehealth: Payer: Self-pay | Admitting: Pharmacist

## 2019-07-01 NOTE — Telephone Encounter (Signed)
07/01/2019 10:07:09 AM - Lantus refill faxed to [employer]  -- Rhetta Mura - Wednesday, July 01, 2019 10:06 AM -- Arneta Cliche [employer] refill for Lantus Vials Max 60 units into the skin once daily #4.

## 2019-07-01 NOTE — Progress Notes (Unsigned)
Entered in error. Patient did not answer phone for visit  This encounter was created in error - please disregard.

## 2019-07-09 ENCOUNTER — Telehealth: Payer: Self-pay | Admitting: Pharmacist

## 2019-07-09 NOTE — Telephone Encounter (Signed)
07/09/2019 11:29:00 AM - Victoza & tips refill to provider  -- Rhetta Mura - Thursday, July 09, 2019 11:28 AM -- [employer] refill request to provider for Victoza Inject 1.8mg  every morning #4 & Novofine 32G tips #2.

## 2019-07-15 ENCOUNTER — Emergency Department
Admission: EM | Admit: 2019-07-15 | Discharge: 2019-07-15 | Disposition: A | Discharge status: Home / Self Care | Payer: Self-pay | Attending: Emergency Medicine | Admitting: Emergency Medicine

## 2019-07-15 ENCOUNTER — Other Ambulatory Visit: Payer: Self-pay

## 2019-07-15 DIAGNOSIS — K0889 Other specified disorders of teeth and supporting structures: Secondary | ICD-10-CM | POA: Insufficient documentation

## 2019-07-15 DIAGNOSIS — K0381 Cracked tooth: Secondary | ICD-10-CM | POA: Insufficient documentation

## 2019-07-15 MED ORDER — TRAMADOL HCL 50 MG PO TABS
50.0000 mg | ORAL_TABLET | Freq: Once | ORAL | Status: AC
  Administered 2019-07-15: 50 mg via ORAL
  Filled 2019-07-15: qty 1

## 2019-07-15 MED ORDER — TRAMADOL HCL 50 MG PO TABS: 50.0000 mg | ORAL_TABLET | Freq: Four times a day (QID) | ORAL | 0 refills | Status: DC | PRN

## 2019-07-15 MED ORDER — TRAMADOL HCL 50 MG PO TABS: 50.0000 mg | ORAL_TABLET | Freq: Four times a day (QID) | ORAL | 0 refills | Status: AC | PRN

## 2019-07-15 MED ORDER — CLINDAMYCIN HCL 300 MG PO CAPS: 300.0000 mg | ORAL_CAPSULE | Freq: Three times a day (TID) | ORAL | 0 refills | Status: DC

## 2019-07-15 MED ORDER — CLINDAMYCIN HCL 300 MG PO CAPS: 300.0000 mg | ORAL_CAPSULE | Freq: Three times a day (TID) | ORAL | 0 refills | Status: AC

## 2019-07-15 NOTE — ED Triage Notes (Signed)
Pt has had toothache for about a month. Pt has tried different OTC meds with no relief. Pain is on left side upper teeth. Pt has not had any antibiotics. Pt unable to go to a dentist.

## 2019-07-15 NOTE — ED Provider Notes (Signed)
Emergency Department Provider Note  ____________________________________________  Time seen: Approximately 6:04 PM  I have reviewed the triage vital signs and the nursing notes.   HISTORY  Chief Complaint Dental Pain   Historian Patient    HPI [name] is a 44 y.o. female presents to the emergency department with dental pain.  Patient has broken inferior 20 and pain along the inferior 31.  Patient states that she has attempted to make an appointment with a local dentist but has not secured an appointment yet.  She states that she experiences pain with chewing.  No pain underneath the tongue.  She has been able to maintain her own secretions at home.  She denies nausea, vomiting or fever.  No other alleviating measures have been attempted.   Past Medical History:  Diagnosis Date  . Diabetes mellitus without complication ([facility])   . GERD (gastroesophageal reflux disease)   . Headache   . History of nephrolithiasis 2015   via CT imaging, including an obstructing stone  . Hyperlipidemia   . Hypertension   . Neuromuscular disorder ([facility])      Immunizations up to date:  Yes.     Past Medical History:  Diagnosis Date  . Diabetes mellitus without complication (H[facility])   . GERD (gastroesophageal reflux disease)   . Headache   . History of nephrolithiasis 2015   via CT imaging, including an obstructing stone  . Hyperlipidemia   . Hypertension   . Neuromuscular disorder [facility])     Patient Active Problem List   Diagnosis Date Noted  . Peripheral neuropathy 03/19/2019  . Blurry vision 01/27/2019  . Hyperlipidemia associated with type 2 diabetes mellitus (HCC) 11/13/2018  . Rash 11/13/2018  . Headache 01/16/2018  . Back pain 01/16/2018  . Obesity 05/01/2016  . Depression 05/01/2016  . Metrorrhagia 11/10/2015  . Diabetes (HCC) 03/10/2015  . Anemia 03/10/2015  . Edema 03/10/2015    Past Surgical History:  Procedure Laterality Date  . CHOLECYSTECTOMY  1998     Prior to Admission medications   Medication Sig Start Date End Date Taking? Authorizing Provider  acetaminophen (TYLENOL) 500 MG tablet Take 500 mg by mouth as needed.    [provider]  atorvastatin (LIPITOR) 40 MG tablet Take 1 tablet (40 mg total) by mouth daily. 06/29/19 07/29/19  [name], [name], NP  clindamycin (CLEOCIN) 300 MG capsule Take 1 capsule (300 mg total) by mouth 3 (three) times daily for 10 days. 07/15/19 07/25/19  Orvil Feil, PA-C  gabapentin (NEURONTIN) 300 MG capsule Take 1 capsule (300 mg total) by mouth at bedtime. 03/19/19   [name], [name], NP  glipiZIDE (GLUCOTROL) 10 MG tablet TAKE ONE TABLET BY MOUTH 2 TIMES A DAY BEFORE A MEAL 06/02/19   McGowan, Carollee Herter A, PA-C  hydrocortisone cream 1 % Apply 1 application topically as needed for itching. Rash on arms    [provider]  insulin glargine (LANTUS) 100 UNIT/ML injection Inject up to 51 units every night, increase by 3 units every 3 days until blood sugar in the morning is between 80-130 mg/dl. 03/19/19   [name], [name], NP  liraglutide (VICTOZA) 18 MG/3ML SOPN Inject 0.3 mLs (1.8 mg total) into the skin every morning. 02/12/19   McGowan, Wellington Hampshire, PA-C  Magnesium 250 MG TABS Take 500 mg by mouth daily. Migraines    [provider]  traMADol (ULTRAM) 50 MG tablet Take 1 tablet (50 mg total) by mouth every 6 (six) hours as needed  for up to 3 days. 07/15/19 07/18/19  Orvil Feil, PA-C  vitamin B-12 (CYANOCOBALAMIN) 1000 MCG tablet Take 1,000 mcg by mouth daily.    [provider]  Vitamin D, Ergocalciferol, (DRISDOL) 1.25 MG (50000 UT) CAPS capsule Take 1 capsule (50,000 Units total) by mouth once a week. 02/12/19   Michiel Cowboy A, PA-C    Allergies Penicillins and Topiramate er  Family History  Problem Relation Age of Onset  . Kidney failure Father   . Pancreatic cancer Maternal Grandmother     Social History Social History   Tobacco Use  . Smoking  status: Never Smoker  . Smokeless tobacco: Never Used  Substance Use Topics  . Alcohol use: No  . Drug use: No     Review of Systems  Constitutional: No fever/chills Eyes:  No discharge ENT: Patient has broken inferior 20 and pain of inferior 31.  Respiratory: no cough. No SOB/ use of accessory muscles to breath Gastrointestinal:   No nausea, no vomiting.  No diarrhea.  No constipation. Musculoskeletal: Negative for musculoskeletal pain. Skin: Negative for rash, abrasions, lacerations, ecchymosis.    ____________________________________________   PHYSICAL EXAM:  VITAL SIGNS: ED Triage Vitals  Enc Vitals Group     BP 07/15/19 1642 (!) 142/60     Pulse Rate 07/15/19 1642 99     Resp 07/15/19 1642 18     Temp 07/15/19 1642 99.1 F (37.3 C)     Temp Source 07/15/19 1642 Oral     SpO2 07/15/19 1642 97 %     Weight 07/15/19 1644 273 lb (123.8 kg)     Height 07/15/19 1644 5' 6.5" (1.689 m)     Head Circumference --      Peak Flow --      Pain Score 07/15/19 1647 8     Pain Loc --      Pain Edu? --      Excl. in GC? --      Constitutional: Alert and oriented. Well appearing and in no acute distress. Eyes: Conjunctivae are normal. PERRL. EOMI. Head: Atraumatic. ENT:      Ears:       Nose: No congestion/rhinnorhea.      Mouth/Throat: Mucous membranes are moist.  Patient has broken inferior 20.  She also localizes pain along inferior 31.  No pain to palpation underneath the tongue.  No swelling of the jaw. Neck: No stridor. No cervical spine tenderness to palpation. Cardiovascular: Normal rate, regular rhythm. Normal S1 and S2.  Good peripheral circulation. Respiratory: Normal respiratory effort without tachypnea or retractions. Lungs CTAB. Good air entry to the bases with no decreased or absent breath sounds Skin:  Skin is warm, dry and intact. No rash noted. Psychiatric: Mood and affect are normal for age. Speech and behavior are normal.    ____________________________________________   LABS (all labs ordered are listed, but only abnormal results are displayed)  Labs Reviewed - No data to display ____________________________________________  EKG   ____________________________________________  RADIOLOGY   No results found.  ____________________________________________    PROCEDURES  Procedure(s) performed:     Procedures     Medications  traMADol (ULTRAM) tablet 50 mg (50 mg Oral Given 07/15/19 1759)     ____________________________________________   INITIAL IMPRESSION / ASSESSMENT AND PLAN / ED COURSE  Pertinent labs & imaging results that were available during my care of the patient were reviewed by me and considered in my medical decision making (see chart for details).  Assessment and plan Dental pain:  44 year old female presents to the emergency department with pain of inferior 20 and inferior 31.  Patient was started on clindamycin and given tramadol for pain.  She was advised to follow-up with a local dentist as soon as possible.  Dental resources were provided in her discharge paperwork.  All patient questions were answered.     ____________________________________________  FINAL CLINICAL IMPRESSION(S) / ED DIAGNOSES  Final diagnoses:  Pain, dental      NEW MEDICATIONS STARTED DURING THIS VISIT:  ED Discharge Orders         Ordered    clindamycin (CLEOCIN) 300 MG capsule  3 times daily,   Status:  Discontinued     07/15/19 1747    traMADol (ULTRAM) 50 MG tablet  Every 6 hours PRN,   Status:  Discontinued     07/15/19 1747    clindamycin (CLEOCIN) 300 MG capsule  3 times daily     07/15/19 1752    traMADol (ULTRAM) 50 MG tablet  Every 6 hours PRN,   Status:  Discontinued     07/15/19 1752    traMADol (ULTRAM) 50 MG tablet  Every 6 hours PRN     07/15/19 1753              This chart was dictated using voice recognition software/Dragon. Despite best  efforts to proofread, errors can occur which can change the meaning. Any change was purely unintentional.     Lannie Fields, PA-C 07/15/19 [place]cott Parma, MD 07/15/19 [phone]

## 2019-07-15 NOTE — Discharge Instructions (Signed)
OPTIONS FOR DENTAL FOLLOW UP CARE ° °[place] Department of Health and Human Services - Local Safety Net Dental Clinics °http://www.ncdhhs.gov/dph/oralhealth/services/safetynetclinics.htm °  °[facility] (336-562-3123) ° °[facility] [place] (919-933-9087) ° °[facility] (919-663-1744 ext [phone]) ° °[facility] ([phone]) ° °[facility] ([phone]) °This clinic caters to the indigent population and is on a lottery system. °Location: °[facility], [address], [place] °Clinic Hours: °Wednesdays from 6pm - 9pm, patients seen by a lottery system. °For dates, call or go to www.med.[facility].edu/[facility]/patients/Dental-[facility] °Services: °Cleanings, fillings and simple extractions. °Payment Options: °DENTAL WORK IS FREE OF CHARGE. Bring proof of income or support. °Best way to get seen: °Arrive at 5:15 pm - this is a lottery, NOT first come/first serve, so arriving earlier will not increase your chances of being seen. °  °  °[facility] °[phone] °Select option 1 for emergencies °  °Location: °[facility], [address], [place] °Clinic Hours: °No walk-ins accepted - call the day before to schedule an appointment. °Check in times are 9:30 am and 1:30 pm. °Services: °Simple extractions, temporary fillings, pulpectomy/pulp debridement, uncomplicated abscess drainage. °Payment Options: °PAYMENT IS DUE AT THE TIME OF SERVICE.  Fee is usually $100-200, additional surgical procedures (e.g. abscess drainage) may be extra. °Cash, checks, Visa/MasterCard accepted.  Can file Medicaid if patient is covered for dental - patient should call case worker to check. °No discount for [facility] patients. °Best way to get seen: °MUST call the day before and get onto the schedule. Can usually be seen the next 1-2 days. No walk-ins accepted. °  °  °[facility] °[phone] °   °Location: °[facility], [address], [place] °Clinic Hours: °M, W, Th, F 8am or 1:30pm, Tues 9a or 1:30 - first come/first served. °Services: °Simple extractions, temporary fillings, uncomplicated abscess drainage.  You do not need to be an [place] resident. °Payment Options: °PAYMENT IS DUE AT THE TIME OF SERVICE. °Dental insurance, otherwise sliding scale - bring proof of income or support. °Depending on income and treatment needed, cost is usually $50-200. °Best way to get seen: °Arrive early as it is first come/first served. °  °  °[facility] °[phone] °  °Location: °[address] °Clinic Hours: °Mon-Thu 8a-5p °Services: °Most basic dental services including extractions and fillings. °Payment Options: °PAYMENT IS DUE AT THE TIME OF SERVICE. °Sliding scale, up to 50% off - bring proof if income or support. °Medicaid with dental option accepted. °Best way to get seen: °Call to schedule an appointment, can usually be seen within 2 weeks OR they will try to see walk-ins - show up at 8a or 2p (you may have to wait). °  °  °[facility] °[phone] °[place] RESIDENTS ONLY °  °Location: °[facility], [address], [place], [place] [address] °Clinic Hours: By appointment only. °Monday - Thursday 8am-5pm, Friday 8am-12pm °Services: Cleanings, fillings, extractions. °Payment Options: °PAYMENT IS DUE AT THE TIME OF SERVICE. °Cash, Visa or MasterCard. Sliding scale - $30 minimum per service. °Best way to get seen: °Come in to office, complete packet and make an appointment - need proof of income °or support monies for each household member and proof of [place] residence. °Usually takes about a month to get in. °  °  °[facility] °[phone] °  °Location: °[address]   [place] °Clinic Hours: Walk-in Urgent Care Dental Services are offered Monday-Friday  mornings only. °The numbers of emergencies accepted daily is limited to the number of °providers available. °Maximum 15 - Mondays, Wednesdays & Thursdays °Maximum 10 - Tuesdays & Fridays °Services: °You do not need to be a [place] resident to be seen for a dental emergency. °Emergencies are defined as pain, swelling, abnormal bleeding, or dental trauma. Walkins will receive x-rays if needed. °NOTE: Dental cleaning is not an emergency. °Payment Options: °PAYMENT IS DUE AT THE TIME OF SERVICE. °Minimum co-pay is $40.00 for uninsured patients. °Minimum co-pay is $3.00 for Medicaid with dental coverage. °Dental Insurance is accepted and must be presented at time of visit. °Medicare does not cover dental. °Forms of payment: Cash, credit card, checks. °Best way to get seen: °If not previously registered with the clinic, walk-in dental registration begins at 7:15 am and is on a first come/first serve basis. °If previously registered with the clinic, call to make an appointment. °  °  °[facility] °[phone] °[place] RESIDENTS ONLY °  °Location: °[address], [place], [place] °Clinic Hours: °Mon-Thu 10a-2p °Services: Extractions only! °Payment Options: °FREE (donations accepted) - bring proof of income or support °Best way to get seen: °Call and schedule an appointment OR come at 8am on the 1st Monday of every month (except for holidays) when it is first come/first served. °  °  °[facility] °[phone] °  °Location: °[address], [place] °Clinic Hours: °Friday mornings °Services, Payment Options, Best way to get seen: °Call for info °

## 2019-07-27 ENCOUNTER — Ambulatory Visit: Payer: Self-pay

## 2019-07-27 ENCOUNTER — Other Ambulatory Visit: Payer: Self-pay

## 2019-07-27 DIAGNOSIS — Z79899 Other long term (current) drug therapy: Secondary | ICD-10-CM

## 2019-07-27 NOTE — Progress Notes (Signed)
Medication Management Clinic Visit Note  Patient: [name] MRN: [id number] Date of Birth: 30-Nov-1975 PCP: Hannah Gala, NP   [name] 44 y.o. female presents for a telephone medication therapy management visit with the pharmacist today. Patient identity confirmed using two patient identifiers.   There were no vitals taken for this visit.  Patient Information   Past Medical History:  Diagnosis Date  . Diabetes mellitus without complication (HCC)   . GERD (gastroesophageal reflux disease)   . Headache   . History of nephrolithiasis 2015   via CT imaging, including an obstructing stone  . Hyperlipidemia   . Hypertension   . Neuromuscular disorder [facility])       Past Surgical History:  Procedure Laterality Date  . CHOLECYSTECTOMY  1998     Family History  Problem Relation Age of Onset  . Kidney failure Father   . Pancreatic cancer Maternal Grandmother     Social History   Substance and Sexual Activity  Alcohol Use No      Social History   Tobacco Use  Smoking Status Never Smoker  Smokeless Tobacco Never Used      Health Maintenance  Topic Date Due  . PNEUMOCOCCAL POLYSACCHARIDE VACCINE AGE 35-64 HIGH RISK  Never done  . FOOT EXAM  Never done  . HIV Screening  Never done  . TETANUS/TDAP  Never done  . PAP SMEAR-Modifier  Never done  . OPHTHALMOLOGY EXAM  07/04/2018  . INFLUENZA VACCINE  08/12/2019 (Originally 12/13/2018)  . HEMOGLOBIN A1C  12/28/2019  . URINE MICROALBUMIN  06/09/2020   Outpatient Encounter Medications as of 07/27/2019  Medication Sig  . atorvastatin (LIPITOR) 40 MG tablet Take 1 tablet (40 mg total) by mouth daily.  . insulin glargine (LANTUS) 100 UNIT/ML injection Inject up to 51 units every night, increase by 3 units every 3 days until blood sugar in the morning is between 80-130 mg/dl. (Patient taking differently: Inject 66 Units into the skin at bedtime. Inject up to 51 units every night, increase by 3 units  every 3 days until blood sugar in the morning is between 80-130 mg/dl.)  . liraglutide (VICTOZA) 18 MG/3ML SOPN Inject 0.3 mLs (1.8 mg total) into the skin every morning.  Marland Kitchen acetaminophen (TYLENOL) 500 MG tablet Take 500 mg by mouth as needed.  . gabapentin (NEURONTIN) 300 MG capsule Take 1 capsule (300 mg total) by mouth at bedtime. (Patient not taking: Reported on 07/27/2019)  . hydrocortisone cream 1 % Apply 1 application topically as needed for itching. Rash on arms  . ibuprofen (ADVIL) 200 MG tablet Take 600 mg by mouth at bedtime as needed for headache.  . [DISCONTINUED] glipiZIDE (GLUCOTROL) 10 MG tablet TAKE ONE TABLET BY MOUTH 2 TIMES A DAY BEFORE A MEAL  . [DISCONTINUED] Magnesium 250 MG TABS Take 500 mg by mouth daily. Migraines  . [DISCONTINUED] vitamin B-12 (CYANOCOBALAMIN) 1000 MCG tablet Take 1,000 mcg by mouth daily.  . [DISCONTINUED] Vitamin D, Ergocalciferol, (DRISDOL) 1.25 MG (50000 UT) CAPS capsule Take 1 capsule (50,000 Units total) by mouth once a week.   No facility-administered encounter medications on file as of 07/27/2019.    Health Maintenance/Date Completed  Last ED visit: 07/15/2019 (dental pain) Last Visit to PCP: 03/19/2019 Next Visit to PCP: 08/04/2019 Specialist Visit: n/a Dental Exam: Patient reports infected tooth. Reports appointment scheduled for tooth pull on 07/28/2019. Taking clindamycin and Tylenol #3 acutely.  Eye Exam: Wears glasses for distance vision. Last eye exam was 06/2018 Pelvic/PAP  Exam: Reports last exam did not go well so has not followed up for any further exams Mammogram: Reports last was 2 years ago. No family hx of breast cancer Colonoscopy: Never Flu Vaccine: Elects not to receive Pneumonia Vaccine: Never COVID-19 Vaccine: Never  Assessment and Plan:  1. Type 2 Diabetes Mellitus, un-controlled -Last hemoglobin A1c was 9.3% which was relatively un-changed from 9.2% on 02/11/2019 -Antidiabetic medications include insulin glargine  (Lantus) 66 units nightly and liraglutide (Victoza) 1.8 mg daily. She is no longer taking glipizide. -Patient reports checking her blood sugar two times daily (AM sugars: 230s, PM sugars: 240s-280s) -Endorses history of symptomatic hypoglycemia (dizziness/nausea) although this has not occurred recently  2. Hyperlipidemia -Atorvastatin 40 mg daily -Last lipid panel on 11/05/2018 with HDL 31, LDL 81, TG 142  3. Peripheral Neuropathy -Did not tolerate duloxetine or gabapentin previously (brain fog) -Per chart, saw [facility] neurologist on 05/22/2019 who prescribed oxcarbazepine. Patient did not report taking this medication during call. Will follow-up at next appointment visit.   4. Migraines -Reports migraines have subsided since starting Tylenol #3 for her tooth infection -She takes APAP and ibuprofen (alternating) when she has migraines but is not currently taking either while on Tylenol #3  5. Tooth infection -Patient was seen at [facility] ED with dental pain and was discharged on tramadol and clindamycin. She reports the dentist started her on Tylenol #3 for pain -Patient endorses dental appointment 07/28/2019 to have tooth pulled  6. Medication Adherence -Patient endorses taking medications as prescribed -Not requiring refills on medications or glucose testing supplies or syringes/needles at this time  RTC 1 year  [place] Resident 27 July 2019

## 2019-07-28 ENCOUNTER — Ambulatory Visit: Payer: Self-pay | Admitting: Gerontology

## 2019-08-04 ENCOUNTER — Ambulatory Visit: Payer: Self-pay | Admitting: Gerontology

## 2019-08-04 ENCOUNTER — Telehealth: Payer: Self-pay | Admitting: Gerontology

## 2019-08-04 ENCOUNTER — Telehealth: Payer: Self-pay | Admitting: Pharmacist

## 2019-08-04 ENCOUNTER — Other Ambulatory Visit: Payer: Self-pay

## 2019-08-04 NOTE — Telephone Encounter (Signed)
Called pt at 2:41pm and left message, reminding pt to reschedule appt

## 2019-08-04 NOTE — Telephone Encounter (Signed)
08/04/2019 4:14:48 PM - refill faxed for Victoza & tips  -- Rhetta Mura - Tuesday, August 04, 2019 4:13 PM -- [employer] refill request for Victoza Inject 1.8mg  every AM # 4 boxes & Novofine 32Gtips # 2 boxes.

## 2019-08-19 ENCOUNTER — Ambulatory Visit
Admission: RE | Admit: 2019-08-19 | Discharge: 2019-08-19 | Disposition: A | Discharge status: Home / Self Care | Payer: Self-pay | Attending: Adult Health Nurse Practitioner | Admitting: Adult Health Nurse Practitioner

## 2019-08-19 ENCOUNTER — Other Ambulatory Visit: Payer: Self-pay

## 2019-08-19 ENCOUNTER — Encounter: Payer: Self-pay | Admitting: Gerontology

## 2019-08-19 ENCOUNTER — Ambulatory Visit
Admission: RE | Admit: 2019-08-19 | Discharge: 2019-08-19 | Disposition: A | Discharge status: Home / Self Care | Payer: Self-pay | Source: Ambulatory Visit | Attending: Adult Health Nurse Practitioner | Admitting: Adult Health Nurse Practitioner

## 2019-08-19 ENCOUNTER — Ambulatory Visit: Payer: Self-pay | Admitting: Gerontology

## 2019-08-19 VITALS — BP 104/57 | HR 67 | Temp 97.5°F | Ht 66.5 in | Wt 276.6 lb

## 2019-08-19 DIAGNOSIS — M545 Low back pain, unspecified: Secondary | ICD-10-CM

## 2019-08-19 DIAGNOSIS — M542 Cervicalgia: Secondary | ICD-10-CM | POA: Insufficient documentation

## 2019-08-19 DIAGNOSIS — E1142 Type 2 diabetes mellitus with diabetic polyneuropathy: Secondary | ICD-10-CM

## 2019-08-19 NOTE — Progress Notes (Signed)
Established Patient Office Visit  Subjective:  Patient ID: [name], female    DOB: 1975-08-12  Age: 44 y.o. MRN: [id number]  CC:  Chief Complaint  Patient presents with  . Follow-up    HPI [name] presents for follow up of T2DM and peripheral neuropathy. She has missed several of her appointments.Her HgbA1c done on 06/30/2019 increased from 9.2% to 9.3%. She states that she checks her blood glucose twice daily, and her fasting reading today was 184 mg/dl. Her blood glucose was checked during visit and it was 172 mg/dl. She states that she's compliant with her medication and continues to work on adhering to [employer]. She continues to experience numbness and tingling to her hands especially at night. She declines taking gabapentin and Cymbalta. She was seen at the [facility] Neurology clinic on 05/22/2019 by Genoveva Ill PA for her chronic midline thoracic back pain, numbness,tingling to bilateral upper and lower extremities. She has not done Lumbar X ray that was ordered and she states that she didn't pick up Oxcarbamazepine. She denies back pain during visit and will follow up at [facility]rology clinic on 08/20/2019. She was seen at the ED on 07/15/2019 for dental pain, she states that she followed up with outpatient Dentist for tooth extraction. Overall, she states that she's doing well and offers no further complaint.  Past Medical History:  Diagnosis Date  . Diabetes mellitus without complication ([place])   . GERD (gastroesophageal reflux disease)   . Headache   . History of nephrolithiasis 2015   via CT imaging, including an obstructing stone  . Hyperlipidemia   . Hypertension   . Neuromuscular disorder [facility])     Past Surgical History:  Procedure Laterality Date  . CHOLECYSTECTOMY  1998    Family History  Problem Relation Age of Onset  . Kidney failure Father   . Pancreatic cancer Maternal Grandmother     Social History   Socioeconomic History  . Marital  status: Single    Spouse name: Not on file  . Number of children: Not on file  . Years of education: Not on file  . Highest education level: Not on file  Occupational History  . Not on file  Tobacco Use  . Smoking status: Never Smoker  . Smokeless tobacco: Never Used  Substance and Sexual Activity  . Alcohol use: No  . Drug use: No  . Sexual activity: Never  Other Topics Concern  . Not on file  Social History Narrative   - Needs a blood sugar monitor       Patient said she is living with her mom and things are going ok. Mom provides for her food, transport, etc.       Patient wants to wait until next visit before any information is shared.    Social Determinants of Health   Financial Resource Strain:   . Difficulty of Paying Living Expenses:   Food Insecurity:   . Worried About Charity fundraiser in the Last Year:   . Arboriculturist in the Last Year:   Transportation Needs:   . Film/video editor (Medical):   Marland Kitchen Lack of Transportation (Non-Medical):   Physical Activity:   . Days of Exercise per Week:   . Minutes of Exercise per Session:   Stress:   . Feeling of Stress :   Social Connections:   . Frequency of Communication with Friends and Family:   . Frequency of Social Gatherings with  Friends and Family:   . Attends Religious Services:   . Active Member of Clubs or Organizations:   . Attends Archivist Meetings:   Marland Kitchen Marital Status:   Intimate Partner Violence:   . Fear of Current or Ex-Partner:   . Emotionally Abused:   Marland Kitchen Physically Abused:   . Sexually Abused:     Outpatient Medications Prior to Visit  Medication Sig Dispense Refill  . acetaminophen (TYLENOL) 500 MG tablet Take 500 mg by mouth as needed.    Marland Kitchen ibuprofen (ADVIL) 200 MG tablet Take 600 mg by mouth at bedtime as needed for headache.    . liraglutide (VICTOZA) 18 MG/3ML SOPN Inject 0.3 mLs (1.8 mg total) into the skin every morning. 4 pen 3  . atorvastatin (LIPITOR) 40 MG tablet  Take 1 tablet (40 mg total) by mouth daily. 30 tablet 0  . hydrocortisone cream 1 % Apply 1 application topically as needed for itching. Rash on arms    . insulin glargine (LANTUS) 100 UNIT/ML injection Inject up to 51 units every night, increase by 3 units every 3 days until blood sugar in the morning is between 80-130 mg/dl. (Patient taking differently: Inject 66 Units into the skin at bedtime. Inject up to 51 units every night, increase by 3 units every 3 days until blood sugar in the morning is between 80-130 mg/dl.) 10 mL 11  . gabapentin (NEURONTIN) 300 MG capsule Take 1 capsule (300 mg total) by mouth at bedtime. (Patient not taking: Reported on 07/27/2019) 30 capsule 0   No facility-administered medications prior to visit.    Allergies  Allergen Reactions  . Penicillins Hives    Denies associated shortness of breath.   . Topiramate Er     Worsened migraines, N/V    ROS Review of Systems  Eyes: Negative.   Respiratory: Negative.   Cardiovascular: Negative.   Endocrine: Negative.   Musculoskeletal: Negative.  Negative for back pain.  Neurological: Positive for numbness (peripheral neuropathy).  Psychiatric/Behavioral: Negative.       Objective:    Physical Exam  Constitutional: She is oriented to person, place, and time. She appears well-developed.  HENT:  Head: Normocephalic and atraumatic.  Eyes: Pupils are equal, round, and reactive to light. EOM are normal.  Cardiovascular: Normal rate and regular rhythm.  Pulmonary/Chest: Effort normal and breath sounds normal.  Neurological: She is alert and oriented to person, place, and time.  Psychiatric: She has a normal mood and affect. Her behavior is normal. Judgment and thought content normal.    BP (!) 104/57 (BP Location: Left Arm, Patient Position: Sitting, Cuff Size: Large)   Pulse 67   Temp (!) 97.5 F (36.4 C)   Ht 5' 6.5" (1.689 m)   Wt 276 lb 9.6 oz (125.5 kg)   SpO2 95%   BMI 43.98 kg/m  Wt Readings from  Last 3 Encounters:  08/19/19 276 lb 9.6 oz (125.5 kg)  07/15/19 273 lb (123.8 kg)  03/19/19 275 lb (124.7 kg)   She was encouraged to loose weight  Health Maintenance Due  Topic Date Due  . PNEUMOCOCCAL POLYSACCHARIDE VACCINE AGE 70-64 HIGH RISK  Never done  . HIV Screening  Never done  . TETANUS/TDAP  Never done  . PAP SMEAR-Modifier  Never done  . OPHTHALMOLOGY EXAM  07/04/2018    There are no preventive care reminders to display for this patient.  Lab Results  Component Value Date   TSH 2.200 01/16/2018   Lab Results  Component Value Date   WBC 11.1 (H) 04/17/2018   HGB 10.3 (L) 04/17/2018   HCT 34.0 04/17/2018   MCV 74 (L) 04/17/2018   PLT 429 04/17/2018   Lab Results  Component Value Date   NA 136 02/11/2019   K 4.1 02/11/2019   CO2 22 02/11/2019   GLUCOSE 362 (H) 02/11/2019   BUN 11 02/11/2019   CREATININE 0.83 02/11/2019   BILITOT 0.4 02/11/2019   ALKPHOS 123 (H) 02/11/2019   AST 11 02/11/2019   ALT 12 02/11/2019   PROT 7.2 02/11/2019   ALBUMIN 4.2 02/11/2019   CALCIUM 9.4 02/11/2019   ANIONGAP 12 07/30/2017   Lab Results  Component Value Date   CHOL 140 11/05/2018   Lab Results  Component Value Date   HDL 31 (L) 11/05/2018   Lab Results  Component Value Date   LDLCALC 81 11/05/2018   Lab Results  Component Value Date   TRIG 142 11/05/2018   Lab Results  Component Value Date   CHOLHDL 4.5 (H) 11/05/2018   Lab Results  Component Value Date   HGBA1C 9.3 (A) 06/30/2019      Assessment & Plan:    1. Type 2 diabetes mellitus with diabetic polyneuropathy, with long-term current use of insulin (HCC) - Her HgbA1c was 9.3%, and her goal should be <7%. She will continue on current treatment regimen, was advised to check blood glucose twice daily, record and bring log to appointment. She was advised that her fasting blood glucose will be between 80-130 mg/dl, continue on low carb/non concentrated sweet diet and exercise as tolerated. She was  advised to follow up with [facility] Neurology clinic on 08/20/2019 for Numbness and tingling- Ambulatory referral to [facility] Endocr[facility]ology for T2DM management. - HgB A1c; Future - Comp Met (CMET); Future - Lipid panel; Future - CBC w/Diff; Future  2. Acute low back pain without sciatica, unspecified back pain laterality - She was advised to take Lumbar x ray and follow up with [facility] Neurology clinic on 08/20/2019.    Follow-up: Return in about 6 weeks (around 10/01/2019), or if symptoms worsen or fail to improve.    Hannah Selmer Jerold Coombe, NP

## 2019-08-19 NOTE — Patient Instructions (Signed)
Carbohydrate Counting for Diabetes Mellitus, Adult  Carbohydrate counting is a method of keeping track of how many carbohydrates you eat. Eating carbohydrates naturally increases the amount of sugar (glucose) in the blood. Counting how many carbohydrates you eat helps keep your blood glucose within normal limits, which helps you manage your diabetes (diabetes mellitus). It is important to know how many carbohydrates you can safely have in each meal. This is different for every person. A diet and nutrition specialist (registered dietitian) can help you make a meal plan and calculate how many carbohydrates you should have at each meal and snack. Carbohydrates are found in the following foods:  Grains, such as breads and cereals.  Dried beans and soy products.  Starchy vegetables, such as potatoes, peas, and corn.  Fruit and fruit juices.  Milk and yogurt.  Sweets and snack foods, such as cake, cookies, candy, chips, and soft drinks. How do I count carbohydrates? There are two ways to count carbohydrates in food. You can use either of the methods or a combination of both. Reading "Nutrition Facts" on packaged food The "Nutrition Facts" list is included on the labels of almost all packaged foods and beverages in the [place] It includes:  The serving size.  Information about nutrients in each serving, including the grams (g) of carbohydrate per serving. To use the "Nutrition Facts":  Decide how many servings you will have.  Multiply the number of servings by the number of carbohydrates per serving.  The resulting number is the total amount of carbohydrates that you will be having. Learning standard serving sizes of other foods When you eat carbohydrate foods that are not packaged or do not include "Nutrition Facts" on the label, you need to measure the servings in order to count the amount of carbohydrates:  Measure the foods that you will eat with a food scale or measuring cup, if  needed.  Decide how many standard-size servings you will eat.  Multiply the number of servings by 15. Most carbohydrate-rich foods have about 15 g of carbohydrates per serving. ? For example, if you eat 8 oz (170 g) of strawberries, you will have eaten 2 servings and 30 g of carbohydrates (2 servings x 15 g = 30 g).  For foods that have more than one food mixed, such as soups and casseroles, you must count the carbohydrates in each food that is included. The following list contains standard serving sizes of common carbohydrate-rich foods. Each of these servings has about 15 g of carbohydrates:   hamburger bun or  English muffin.   oz (15 mL) syrup.   oz (14 g) jelly.  1 slice of bread.  1 six-inch tortilla.  3 oz (85 g) cooked rice or pasta.  4 oz (113 g) cooked dried beans.  4 oz (113 g) starchy vegetable, such as peas, corn, or potatoes.  4 oz (113 g) hot cereal.  4 oz (113 g) mashed potatoes or  of a large baked potato.  4 oz (113 g) canned or frozen fruit.  4 oz (120 mL) fruit juice.  4-6 crackers.  6 chicken nuggets.  6 oz (170 g) unsweetened dry cereal.  6 oz (170 g) plain fat-free yogurt or yogurt sweetened with artificial sweeteners.  8 oz (240 mL) milk.  8 oz (170 g) fresh fruit or one small piece of fruit.  24 oz (680 g) popped popcorn. Example of carbohydrate counting Sample meal  3 oz (85 g) chicken breast.  6 oz (170 g)   brown rice.  4 oz (113 g) corn.  8 oz (240 mL) milk.  8 oz (170 g) strawberries with sugar-free whipped topping. Carbohydrate calculation 1. Identify the foods that contain carbohydrates: ? Rice. ? Corn. ? Milk. ? Strawberries. 2. Calculate how many servings you have of each food: ? 2 servings rice. ? 1 serving corn. ? 1 serving milk. ? 1 serving strawberries. 3. Multiply each number of servings by 15 g: ? 2 servings rice x 15 g = 30 g. ? 1 serving corn x 15 g = 15 g. ? 1 serving milk x 15 g = 15 g. ? 1  serving strawberries x 15 g = 15 g. 4. Add together all of the amounts to find the total grams of carbohydrates eaten: ? 30 g + 15 g + 15 g + 15 g = 75 g of carbohydrates total. Summary  Carbohydrate counting is a method of keeping track of how many carbohydrates you eat.  Eating carbohydrates naturally increases the amount of sugar (glucose) in the blood.  Counting how many carbohydrates you eat helps keep your blood glucose within normal limits, which helps you manage your diabetes.  A diet and nutrition specialist (registered dietitian) can help you make a meal plan and calculate how many carbohydrates you should have at each meal and snack. This information is not intended to replace advice given to you by your health care provider. Make sure you discuss any questions you have with your health care provider. Document Revised: 11/22/2016 Document Reviewed: 10/12/2015 Elsevier Patient Education  2020 Elsevier Inc.  

## 2019-09-01 ENCOUNTER — Other Ambulatory Visit: Payer: Self-pay

## 2019-09-01 ENCOUNTER — Ambulatory Visit: Payer: Self-pay

## 2019-09-01 NOTE — Progress Notes (Incomplete)
Follow up Diabetes/ Endocrine [facility]     Patient ID: [name], female   DOB: 1975/10/13, 44 y.o.   MRN: [id number] Assessment:  [name] is a 44 y.o. female who is seen in follow up for No primary diagnosis found. at the request of Doles-Johnson, Teah, NP.  Encounter Diagnoses No diagnosis found.  Assessment  [name] is a 44yo woman with a PMH of T2DM, GERD (gastroesophageal reflux disease), Headache, History of nephrolithiasis (2015), Hyperlipidemia, Hypertension, and Neuromuscular disorder [facility]), presenting for follow-up management of T2DM care. As her fasting glucose is in the 230s, A1c last month was slightly increased from prior reading, and no frequent hypoglycemic episodes are noted, she is recommended to increase Lantus to 72 units. Labs today are not recent so she is recommended to return for A1c and microalbumin:Cr ratio when able.  Plan:    1. Increase to 72 units of Lantus from 66. 2. Return for A1c and microalbumin:creatinine ratio when able. 3. Schedule with ophthalmology. 4. Follow up in 3 months    There are no Patient Instructions on file for this visit.   No orders of the defined types were placed in this encounter.    Subjective:  [name] is a 44yo woman with a past medical history of Diabetes mellitus without complication (HCC), GERD (gastroesophageal reflux disease), Headache, History of nephrolithiasis (2015), Hyperlipidemia, Hypertension, and Neuromuscular disorder (HCC). Today she reports fasting glucose readings that are "not good," in the 230s. Her last A1c was a point-of-care of 9.3%. She reports knowing she needs to exercise more, but does not have the energy. She reports meals tend to be "grab and go," but is not able to cook meals. She is currently managed with 66 units of Lantus and 1.8mg  Victoza. She reports dizziness from either low blood sugar or high blood sugar once every few months. No blurry vision,  numbness, or increased polyuria are noted today.    Review of Systems  Eyes: Negative for visual disturbance.  Endocrine: Negative for polyuria.  Neurological: Negative for numbness.     Family History, Social History, current Medications and allergies reviewed and updated in Epic.  Objective:    There were no vitals taken for this visit. Physical Exam      Data : I have personally reviewed pertinent labs and imaging studies, if indicated,  with the patient in clinic today.   Lab Orders  No laboratory test(s) ordered today    HC Readings from Last 3 Encounters:  No data found for [facility]    Wt Readings from Last 3 Encounters:  08/19/19 276 lb 9.6 oz (125.5 kg)  07/15/19 273 lb (123.8 kg)  03/19/19 275 lb (124.7 kg)

## 2019-09-09 ENCOUNTER — Telehealth: Payer: Self-pay

## 2019-09-09 NOTE — Telephone Encounter (Signed)
Called patient to remind her that she needs to return recertification paperwork to [facility] and/or [facility]. Her eligibility expires 09/12/2019.  Patient stated she can't find what was mailed to her and will stop by the clinic to pick up new paperwork either today or tomorrow.

## 2019-09-23 ENCOUNTER — Other Ambulatory Visit: Payer: Self-pay

## 2019-09-29 ENCOUNTER — Ambulatory Visit: Payer: Self-pay

## 2019-09-30 ENCOUNTER — Telehealth: Payer: Self-pay | Admitting: Pharmacy Technician

## 2019-09-30 NOTE — Telephone Encounter (Signed)
Received updated proof of income.  Patient eligible to receive medication assistance at [facility] until time for re-certification in 9359, and as long as eligibility requirements continue to be met.  [facility]

## 2019-10-01 ENCOUNTER — Ambulatory Visit: Payer: Self-pay | Admitting: Gerontology

## 2019-10-06 ENCOUNTER — Ambulatory Visit: Payer: Self-pay | Admitting: Gerontology

## 2019-10-28 ENCOUNTER — Other Ambulatory Visit: Payer: Self-pay

## 2019-10-28 MED ORDER — BASAGLAR KWIKPEN 100 UNIT/ML ~~LOC~~ SOPN: 60.0000 [IU] | PEN_INJECTOR | Freq: Every day | SUBCUTANEOUS | 0 refills | Status: DC

## 2019-11-03 ENCOUNTER — Telehealth: Payer: Self-pay | Admitting: Pharmacist

## 2019-11-03 NOTE — Telephone Encounter (Signed)
11/03/2019 10:19:22 AM - Lantus Vial renewal & dose change  -- Rhetta Mura - Tuesday, November 03, 2019 10:18 AM --Arneta Cliche [employer] renewal and dose change for Lantus Vials Inject 72 units daily at bedtime #7 vials.

## 2019-11-11 ENCOUNTER — Other Ambulatory Visit: Payer: Self-pay

## 2019-11-11 DIAGNOSIS — Z794 Long term (current) use of insulin: Secondary | ICD-10-CM

## 2019-11-11 DIAGNOSIS — E1142 Type 2 diabetes mellitus with diabetic polyneuropathy: Secondary | ICD-10-CM

## 2019-11-12 LAB — LIPID PANEL
Chol/HDL Ratio: 4.4 ratio (ref 0.0–4.4)
Cholesterol, Total: 142 mg/dL (ref 100–199)
HDL: 32 mg/dL — ABNORMAL LOW (ref >39)
LDL Chol Calc (NIH): 94 mg/dL (ref 0–99)
Triglycerides: 84 mg/dL (ref 0–149)
VLDL Cholesterol Cal: 16 mg/dL (ref 5–40)

## 2019-11-12 LAB — COMPREHENSIVE METABOLIC PANEL
ALT: 10 IU/L (ref 0–32)
AST: 11 IU/L (ref 0–40)
Albumin/Globulin Ratio: 1.4 (ref 1.2–2.2)
Albumin: 4 g/dL (ref 3.8–4.8)
Alkaline Phosphatase: 119 IU/L (ref 48–121)
BUN/Creatinine Ratio: 15 (ref 9–23)
BUN: 13 mg/dL (ref 6–24)
Bilirubin Total: 0.5 mg/dL (ref 0.0–1.2)
CO2: 24 mmol/L (ref 20–29)
Calcium: 9.1 mg/dL (ref 8.7–10.2)
Chloride: 100 mmol/L (ref 96–106)
Creatinine, Ser: 0.87 mg/dL (ref 0.57–1.00)
GFR calc Af Amer: 92 mL/min/{1.73_m2} (ref >59)
GFR calc non Af Amer: 80 mL/min/{1.73_m2} (ref >59)
Globulin, Total: 2.9 g/dL (ref 1.5–4.5)
Glucose: 295 mg/dL — ABNORMAL HIGH (ref 65–99)
Potassium: 4 mmol/L (ref 3.5–5.2)
Sodium: 135 mmol/L (ref 134–144)
Total Protein: 6.9 g/dL (ref 6.0–8.5)

## 2019-11-12 LAB — CBC WITH DIFFERENTIAL/PLATELET
Basophils Absolute: 0 10*3/uL (ref 0.0–0.2)
Basos: 1 %
EOS (ABSOLUTE): 0.1 10*3/uL (ref 0.0–0.4)
Eos: 2 %
Hematocrit: 34.7 % (ref 34.0–46.6)
Hemoglobin: 10.4 g/dL — ABNORMAL LOW (ref 11.1–15.9)
Immature Grans (Abs): 0 10*3/uL (ref 0.0–0.1)
Immature Granulocytes: 0 %
Lymphocytes Absolute: 1.6 10*3/uL (ref 0.7–3.1)
Lymphs: 26 %
MCH: 22.5 pg — ABNORMAL LOW (ref 26.6–33.0)
MCHC: 30 g/dL — ABNORMAL LOW (ref 31.5–35.7)
MCV: 75 fL — ABNORMAL LOW (ref 79–97)
Monocytes Absolute: 0.4 10*3/uL (ref 0.1–0.9)
Monocytes: 6 %
Neutrophils Absolute: 4.1 10*3/uL (ref 1.4–7.0)
Neutrophils: 65 %
Platelets: 338 10*3/uL (ref 150–450)
RBC: 4.63 x10E6/uL (ref 3.77–5.28)
RDW: 14.9 % (ref 11.7–15.4)
WBC: 6.3 10*3/uL (ref 3.4–10.8)

## 2019-11-12 LAB — HEMOGLOBIN A1C
Est. average glucose Bld gHb Est-mCnc: 203 mg/dL
Hgb A1c MFr Bld: 8.7 % — ABNORMAL HIGH (ref 4.8–5.6)

## 2019-11-19 ENCOUNTER — Telehealth: Payer: Self-pay | Admitting: Gerontology

## 2019-11-19 NOTE — Telephone Encounter (Addendum)
LVM regarding cancelling lab appt and making a fu appt after endo appt. ----- Message from Benjamin Stain, CMA sent at 11/19/2019  9:31 AM EDT ----- Regarding: appt Please call patient to cancel lab appt. She needs followup with provider within 2 weeks of 7/20 Endo appt.

## 2019-11-20 ENCOUNTER — Other Ambulatory Visit: Payer: Self-pay | Admitting: Gerontology

## 2019-11-24 ENCOUNTER — Other Ambulatory Visit: Payer: Self-pay

## 2019-11-24 ENCOUNTER — Other Ambulatory Visit: Payer: Self-pay | Admitting: Gerontology

## 2019-11-24 ENCOUNTER — Telehealth: Payer: Self-pay | Admitting: Adult Health Nurse Practitioner

## 2019-11-24 DIAGNOSIS — E1142 Type 2 diabetes mellitus with diabetic polyneuropathy: Secondary | ICD-10-CM

## 2019-11-24 DIAGNOSIS — Z794 Long term (current) use of insulin: Secondary | ICD-10-CM

## 2019-11-24 MED ORDER — INSULIN GLARGINE 100 UNIT/ML ~~LOC~~ SOLN: 66.0000 [IU] | Freq: Every day | SUBCUTANEOUS | 0 refills | Status: DC

## 2019-11-24 NOTE — Telephone Encounter (Addendum)
Cancelled lab with patient and fu appt scheduled w ilo on 8/3   ----- Message from Benjamin Stain, CMA sent at 11/19/2019  9:31 AM EDT ----- Regarding: appt Please call patient to cancel lab appt. She needs followup with provider within 2 weeks of 7/20 Endo appt.

## 2019-11-26 ENCOUNTER — Encounter: Payer: Self-pay | Admitting: Emergency Medicine

## 2019-11-26 ENCOUNTER — Other Ambulatory Visit: Payer: Self-pay

## 2019-11-26 ENCOUNTER — Emergency Department
Admission: EM | Admit: 2019-11-26 | Discharge: 2019-11-26 | Disposition: A | Discharge status: Home / Self Care | Payer: Self-pay | Attending: Emergency Medicine | Admitting: Emergency Medicine

## 2019-11-26 DIAGNOSIS — N644 Mastodynia: Secondary | ICD-10-CM | POA: Insufficient documentation

## 2019-11-26 DIAGNOSIS — E119 Type 2 diabetes mellitus without complications: Secondary | ICD-10-CM | POA: Insufficient documentation

## 2019-11-26 DIAGNOSIS — Z794 Long term (current) use of insulin: Secondary | ICD-10-CM | POA: Insufficient documentation

## 2019-11-26 DIAGNOSIS — Z79899 Other long term (current) drug therapy: Secondary | ICD-10-CM | POA: Insufficient documentation

## 2019-11-26 DIAGNOSIS — I10 Essential (primary) hypertension: Secondary | ICD-10-CM | POA: Insufficient documentation

## 2019-11-26 LAB — BASIC METABOLIC PANEL
Anion gap: 9 (ref 5–15)
BUN: 11 mg/dL (ref 6–20)
CO2: 25 mmol/L (ref 22–32)
Calcium: 8.9 mg/dL (ref 8.9–10.3)
Chloride: 104 mmol/L (ref 98–111)
Creatinine, Ser: 0.93 mg/dL (ref 0.44–1.00)
GFR calc Af Amer: >60 mL/min (ref >60)
GFR calc non Af Amer: >60 mL/min (ref >60)
Glucose, Bld: 239 mg/dL — ABNORMAL HIGH (ref 70–99)
Potassium: 3.6 mmol/L (ref 3.5–5.1)
Sodium: 138 mmol/L (ref 135–145)

## 2019-11-26 LAB — CBC
HCT: 32.2 % — ABNORMAL LOW (ref 36.0–46.0)
Hemoglobin: 10.2 g/dL — ABNORMAL LOW (ref 12.0–15.0)
MCH: 22.4 pg — ABNORMAL LOW (ref 26.0–34.0)
MCHC: 31.7 g/dL (ref 30.0–36.0)
MCV: 70.8 fL — ABNORMAL LOW (ref 80.0–100.0)
Platelets: 350 10*3/uL (ref 150–400)
RBC: 4.55 MIL/uL (ref 3.87–5.11)
RDW: 15.9 % — ABNORMAL HIGH (ref 11.5–15.5)
WBC: 8.1 10*3/uL (ref 4.0–10.5)
nRBC: 0 % (ref 0.0–0.2)

## 2019-11-26 MED ORDER — VALACYCLOVIR HCL 1 G PO TABS
2000.0000 mg | ORAL_TABLET | Freq: Three times a day (TID) | ORAL | 0 refills | Status: AC
Start: 2019-11-26 — End: 2019-12-03

## 2019-11-26 NOTE — Discharge Instructions (Signed)
Take Valtrex 3 times daily for the next 7 days. Please make appointment with primary care to schedule mammogram. If you notice rash developing under her breast, this is consistent with diagnosis of shingles as discussed during this emergency department encounter.

## 2019-11-26 NOTE — ED Provider Notes (Signed)
Emergency Department Provider Note  ____________________________________________  Time seen: Approximately 7:07 PM  I have reviewed the triage vital signs and the nursing notes.   HISTORY  Chief Complaint Breast Pain   Historian Patient   HPI [name] is a 44 y.o. female presents to the emergency department with acute right breast pain for the past 5 to 6 days.  Patient describes pain as a burning sensation that occurs deep in her breast.  She has not noticed any erythema or rash of the skin overlying the breast.  No discharge from the right nipple.  She states that she has had a right breast wound in the past that was managed conservatively at home several years ago.  She reports that is been several years since she had a mammogram.  She denies fever and chills at home.  Patient has a primary care provider that she is well established with.   Past Medical History:  Diagnosis Date   Diabetes mellitus without complication (HCC)    GERD (gastroesophageal reflux disease)    Headache    History of nephrolithiasis 2015   via CT imaging, including an obstructing stone   Hyperlipidemia    Hypertension    Neuromuscular disorder (HCC)      Immunizations up to date:  Yes.     Past Medical History:  Diagnosis Date   Diabetes mellitus without complication (HCC)    GERD (gastroesophageal reflux disease)    Headache    History of nephrolithiasis 2015   via CT imaging, including an obstructing stone   Hyperlipidemia    Hypertension    Neuromuscular disorder [facility])     Patient Active Problem List   Diagnosis Date Noted   Peripheral neuropathy 03/19/2019   Blurry vision 01/27/2019   Hyperlipidemia associated with type 2 diabetes mellitus (HCC) 11/13/2018   Rash 11/13/2018   Headache 01/16/2018   Back pain 01/16/2018   Obesity 05/01/2016   Depression 05/01/2016   Metrorrhagia 11/10/2015   Diabetes (HCC) 03/10/2015   Anemia 03/10/2015    Edema 03/10/2015    Past Surgical History:  Procedure Laterality Date   CHOLECYSTECTOMY  1998    Prior to Admission medications   Medication Sig Start Date End Date Taking? Authorizing Provider  acetaminophen (TYLENOL) 500 MG tablet Take 500 mg by mouth as needed.    [provider]  atorvastatin (LIPITOR) 40 MG tablet Take 1 tablet (40 mg total) by mouth daily. 06/29/19 07/29/19  [name], [name], NP  hydrocortisone cream 1 % Apply 1 application topically as needed for itching. Rash on arms    [provider]  ibuprofen (ADVIL) 200 MG tablet Take 600 mg by mouth at bedtime as needed for headache.    [provider]  insulin glargine (LANTUS) 100 UNIT/ML injection Inject 0.66 mLs (66 Units total) into the skin at bedtime. Inject up to 51 units every night, increase by 3 units every 3 days until blood sugar in the morning is between 80-130 mg/dl. 11/24/19 02/22/20  [name], [name], NP  liraglutide (VICTOZA) 18 MG/3ML SOPN Inject 0.3 mLs (1.8 mg total) into the skin every morning. 02/12/19   McGowan, Wellington Hampshire, PA-C  valACYclovir (VALTREX) 1000 MG tablet Take 2 tablets (2,000 mg total) by mouth 3 (three) times daily for 7 days. 11/26/19 12/03/19  Orvil Feil, PA-C    Allergies Penicillins and Topiramate er  Family History  Problem Relation Age of Onset   Kidney failure Father    Pancreatic cancer  Maternal Grandmother     Social History Social History   Tobacco Use   Smoking status: Never Smoker   Smokeless tobacco: Never Used  Building services engineer Use: Never used  Substance Use Topics   Alcohol use: No   Drug use: No     Review of Systems  Constitutional: No fever/chills Eyes:  No discharge ENT: No upper respiratory complaints. Respiratory: no cough. No SOB/ use of accessory muscles to breath Gastrointestinal:   No nausea, no vomiting.  No diarrhea.  No constipation. Musculoskeletal: Negative for musculoskeletal pain. Skin:  Patient has right breast pain.     ____________________________________________   PHYSICAL EXAM:  VITAL SIGNS: ED Triage Vitals  Enc Vitals Group     BP 11/26/19 1627 (!) 144/56     Pulse Rate 11/26/19 1627 97     Resp --      Temp 11/26/19 1627 98.4 F (36.9 C)     Temp Source 11/26/19 1627 Oral     SpO2 11/26/19 1627 98 %     Weight 11/26/19 1629 286 lb (129.7 kg)     Height 11/26/19 1629 5' 6.5" (1.689 m)     Head Circumference --      Peak Flow --      Pain Score 11/26/19 1633 7     Pain Loc --      Pain Edu? --      Excl. in GC? --      Constitutional: Alert and oriented. Well appearing and in no acute distress. Eyes: Conjunctivae are normal. PERRL. EOMI. Head: Atraumatic. ENT:      Nose: No congestion/rhinnorhea.      Mouth/Throat: Mucous membranes are moist.  Neck: No stridor.  No cervical spine tenderness to palpation. Cardiovascular: Normal rate, regular rhythm. Normal S1 and S2.  Good peripheral circulation. Respiratory: Normal respiratory effort without tachypnea or retractions. Lungs CTAB. Good air entry to the bases with no decreased or absent breath sounds Gastrointestinal: Bowel sounds x 4 quadrants. Soft and nontender to palpation. No guarding or rigidity. No distention. Musculoskeletal: Full range of motion to all extremities. No obvious deformities noted Neurologic:  Normal for age. No gross focal neurologic deficits are appreciated.  Skin: No erythema is visualized over the right breast.  No dimpling around the nipple.  No masses palpated of the right breast. Psychiatric: Mood and affect are normal for age. Speech and behavior are normal.   ____________________________________________   LABS (all labs ordered are listed, but only abnormal results are displayed)  Labs Reviewed  BASIC METABOLIC PANEL - Abnormal; Notable for the following components:      Result Value   Glucose, Bld 239 (*)    All other components within normal limits  CBC -  Abnormal; Notable for the following components:   Hemoglobin 10.2 (*)    HCT 32.2 (*)    MCV 70.8 (*)    MCH 22.4 (*)    RDW 15.9 (*)    All other components within normal limits   ____________________________________________  EKG   ____________________________________________  RADIOLOGY   No results found.  ____________________________________________    PROCEDURES  Procedure(s) performed:     Procedures     Medications - No data to display   ____________________________________________   INITIAL IMPRESSION / ASSESSMENT AND PLAN / ED COURSE  Pertinent labs & imaging results that were available during my care of the patient were reviewed by me and considered in my medical decision making (see chart for  details).      Assessment and plan Right breast pain 44 year old female presents to the emergency department with right breast pain for the past 5 to 6 days with associated diarrhea.  Patient characterizes right breast pain is a burning sensation.  She was mildly hypertensive at triage but vital signs were otherwise reassuring.  On physical exam, a did not appreciate any erythema or palpable induration or fluctuance that would suggest abscess.  I did not palpate any deep masses.  Differential diagnosis includes shingles, breast mass, neuropathy...  We will start patient on Valtrex as I am highly suspicious for early shingles.  We will also refer patient to primary care provider to schedule mammogram.  Low suspicion for abscess or breast cellulitis at this time with absence of erythema, induration and fluctuance.  Return precautions were given to return with new or worsening symptoms.   ____________________________________________  FINAL CLINICAL IMPRESSION(S) / ED DIAGNOSES  Final diagnoses:  Breast pain      NEW MEDICATIONS STARTED DURING THIS VISIT:  ED Discharge Orders         Ordered    valACYclovir (VALTREX) 1000 MG tablet  3 times daily      Discontinue  Reprint     11/26/19 1904              This chart was dictated using voice recognition software/Dragon. Despite best efforts to proofread, errors can occur which can change the meaning. Any change was purely unintentional.     Orvil Feil, PA-C 11/26/19 1912    Phineas Semen, MD 11/26/19 1919

## 2019-11-26 NOTE — ED Triage Notes (Signed)
Pt in via POV, reports right breast pain, just below the nipple x 5-6 days.  Denies any redness or discharge.  Ambulatory to triage, NAD noted at this time.

## 2019-12-01 ENCOUNTER — Ambulatory Visit: Payer: Self-pay

## 2019-12-01 ENCOUNTER — Other Ambulatory Visit: Payer: Self-pay

## 2019-12-01 NOTE — Progress Notes (Incomplete)
Visit with ophthalmology as soon as possible.      2).  Increase Lantus to 45 units from 42. Aim for fasting glucose between 100-150mg . If fasting glucose is >150 for 5 days in a row, increase Lantus by 2 units.  5). Consider SGLT2 inhibitor in the future.  11/11/2019: 8.7 (down from 9.3 in 06/30/2019) 06/10/2019: UACR 41 (up from 9.5 in 2019) Insulin lantus victoza   Follow up Diabetes/ Endocrine [facility]     Patient ID: [name], female   DOB: Dec 24, 1975, 44 y.o.   MRN: [id number] Assessment:  [name] is a 44 y.o. female who is seen in follow up for No primary diagnosis found. at the request of Doles-Johnson, Teah, NP.  Encounter Diagnoses No diagnosis found.  Assessment    Plan:        There are no Patient Instructions on file for this visit.   No orders of the defined types were placed in this encounter.    Subjective:  CC: [name]   ( ) Kidney stones: ( ) Shingles   Victoza 1.8units daily Insulin Lantus 72u at night  Atorvastatin 1x daily   Usually miss any doses of the victoza, but sometimes forget to take the insulin at night. Not that often. But will forget cholesterol med more often than insulin.  Have had spells where felt woozy and that is usually when blood glucose shoots down. Had a few times when "feeling lows" in the past three months - shot down to 89 at week before last. When having these low, will keep apple juice around and that will help with symptoms  Increased thirst had for a long time - for a few years. Has not changed Nocturia - 3-4x a night, has not changed Will have blurry vision at times, when walking around and blood sugar has either dropped or risen. In the past month, does not happen that often. Haven't seen an eye check up since ophthomalogist at [facility]. Leg swelling: Stay swollen Numbness and tingling in bilateral fingers: Have been having it for a quite a while, main thing is that writing hand (L) keeps  getting sharp pains in the thumb joint, from the thumb to the last two fingers in the hand. Hands right now feel tight. Pain and feeling of tightness has started back up recently, and discussed with her PCP. Sometimes have numbness/tingling in feet - but feet mostly stay swollen and hurt a lot. Took ibuprofen for the pain, but takes that mediation for about everything.  BG readings at home: on average, around 189. Check once in the morning (before any food) and once at night. 250 in the morning will be associated with nausea.  Lives at her mother, mainly stay gone  Diet: "Whatever what I feel like eating" at the time. Yesterday, fish sandwich at [facility] for lunch and dinner constituted for pizza (too hot in the house to cook without [facility]). Beverages: tea with sugar, apple juice (main drink), water Exercise: Not really  Never smoker EtOH use: Never drinker No drugs     Review of Systems  Respiratory: Negative for shortness of breath.   Cardiovascular: Negative for chest pain.    [name]  has a past medical history of Diabetes mellitus without complication (HCC), GERD (gastroesophageal reflux disease), Headache, History of nephrolithiasis (2015), Hyperlipidemia, Hypertension, and Neuromuscular disorder (HCC).  Family History, Social History, current Medications and allergies reviewed and updated in Epic.  Objective:    There  were no vitals taken for this visit. Physical Exam      Data : I have personally reviewed pertinent labs and imaging studies, if indicated,  with the patient in clinic today.   Lab Orders  No laboratory test(s) ordered today    HC Readings from Last 3 Encounters:  No data found for [facility]    Wt Readings from Last 3 Encounters:  11/26/19 286 lb (129.7 kg)  11/12/19 280 lb 12.8 oz (127.4 kg)  08/19/19 276 lb 9.6 oz (125.5 kg)

## 2019-12-02 ENCOUNTER — Encounter: Payer: Self-pay | Admitting: *Deleted

## 2019-12-02 ENCOUNTER — Other Ambulatory Visit: Payer: Self-pay

## 2019-12-02 ENCOUNTER — Emergency Department
Admission: EM | Admit: 2019-12-02 | Discharge: 2019-12-02 | Disposition: A | Discharge status: Home / Self Care | Payer: Self-pay | Attending: Emergency Medicine | Admitting: Emergency Medicine

## 2019-12-02 DIAGNOSIS — T50905A Adverse effect of unspecified drugs, medicaments and biological substances, initial encounter: Secondary | ICD-10-CM

## 2019-12-02 DIAGNOSIS — I10 Essential (primary) hypertension: Secondary | ICD-10-CM | POA: Insufficient documentation

## 2019-12-02 DIAGNOSIS — E119 Type 2 diabetes mellitus without complications: Secondary | ICD-10-CM | POA: Insufficient documentation

## 2019-12-02 DIAGNOSIS — T375X5A Adverse effect of antiviral drugs, initial encounter: Secondary | ICD-10-CM | POA: Insufficient documentation

## 2019-12-02 DIAGNOSIS — Z794 Long term (current) use of insulin: Secondary | ICD-10-CM | POA: Insufficient documentation

## 2019-12-02 DIAGNOSIS — L270 Generalized skin eruption due to drugs and medicaments taken internally: Secondary | ICD-10-CM | POA: Insufficient documentation

## 2019-12-02 DIAGNOSIS — L298 Other pruritus: Secondary | ICD-10-CM

## 2019-12-02 MED ORDER — PREDNISONE 10 MG (21) PO TBPK
ORAL_TABLET | ORAL | 0 refills | Status: DC
Start: 2019-12-02 — End: 2019-12-09

## 2019-12-02 MED ORDER — HYDROXYZINE HCL 10 MG PO TABS
10.0000 mg | ORAL_TABLET | Freq: Three times a day (TID) | ORAL | 0 refills | Status: DC | PRN
Start: 2019-12-02 — End: 2020-03-08

## 2019-12-02 MED ORDER — DEXAMETHASONE SODIUM PHOSPHATE 10 MG/ML IJ SOLN
10.0000 mg | Freq: Once | INTRAMUSCULAR | Status: AC
  Administered 2019-12-02: 10 mg via INTRAMUSCULAR
  Filled 2019-12-02: qty 1

## 2019-12-02 NOTE — ED Provider Notes (Signed)
[facility] Emergency Department Provider Note  ____________________________________________   First MD Initiated Contact with Patient 12/02/19 1730     (approximate)  I have reviewed the triage vital signs and the nursing notes.   HISTORY  Chief Complaint Allergic Reaction    HPI [name] is a 44 y.o. female presents emergency department complaining of itching.  She was given a prescription for Valtrex for possible shingles.  States that since she started taking this medication she has been itching all over.  She is cortisone cream without any relief.  No difficulty breathing.    Past Medical History:  Diagnosis Date  . Diabetes mellitus without complication (HCC)   . GERD (gastroesophageal reflux disease)   . Headache   . History of nephrolithiasis 2015   via CT imaging, including an obstructing stone  . Hyperlipidemia   . Hypertension   . Neuromuscular disorder [facility])     Patient Active Problem List   Diagnosis Date Noted  . Peripheral neuropathy 03/19/2019  . Blurry vision 01/27/2019  . Hyperlipidemia associated with type 2 diabetes mellitus (HCC) 11/13/2018  . Rash 11/13/2018  . Headache 01/16/2018  . Back pain 01/16/2018  . Obesity 05/01/2016  . Depression 05/01/2016  . Metrorrhagia 11/10/2015  . Diabetes (HCC) 03/10/2015  . Anemia 03/10/2015  . Edema 03/10/2015    Past Surgical History:  Procedure Laterality Date  . CHOLECYSTECTOMY  1998    Prior to Admission medications   Medication Sig Start Date End Date Taking? Authorizing Provider  acetaminophen (TYLENOL) 500 MG tablet Take 500 mg by mouth as needed.    [provider]  atorvastatin (LIPITOR) 40 MG tablet Take 1 tablet (40 mg total) by mouth daily. 06/29/19 07/29/19  [name], [name], NP  hydrocortisone cream 1 % Apply 1 application topically as needed for itching. Rash on arms    [provider]  hydrOXYzine (ATARAX/VISTARIL) 10 MG tablet  Take 1 tablet (10 mg total) by mouth 3 (three) times daily as needed. 12/02/19   Jo-Ann Johanning, Roselyn Bering, PA-C  ibuprofen (ADVIL) 200 MG tablet Take 600 mg by mouth at bedtime as needed for headache.    [provider]  insulin glargine (LANTUS) 100 UNIT/ML injection Inject 0.66 mLs (66 Units total) into the skin at bedtime. Inject up to 51 units every night, increase by 3 units every 3 days until blood sugar in the morning is between 80-130 mg/dl. 11/24/19 02/22/20  [name], [name], NP  liraglutide (VICTOZA) 18 MG/3ML SOPN Inject 0.3 mLs (1.8 mg total) into the skin every morning. 02/12/19   McGowan, Wellington Hampshire, PA-C  predniSONE (STERAPRED UNI-PAK 21 TAB) 10 MG (21) TBPK tablet Take 6 pills on day one then decrease by 1 pill each day 12/02/19   Faythe Ghee, PA-C  valACYclovir (VALTREX) 1000 MG tablet Take 2 tablets (2,000 mg total) by mouth 3 (three) times daily for 7 days. 11/26/19 12/03/19  Orvil Feil, PA-C    Allergies Penicillins and Topiramate er  Family History  Problem Relation Age of Onset  . Kidney failure Father   . Pancreatic cancer Maternal Grandmother     Social History Social History   Tobacco Use  . Smoking status: Never Smoker  . Smokeless tobacco: Never Used  Vaping Use  . Vaping Use: Never used  Substance Use Topics  . Alcohol use: No  . Drug use: No    Review of Systems  Constitutional: No fever/chills Eyes: No visual changes. ENT:  No sore throat. Respiratory: Denies cough Cardiovascular: Denies chest pain Gastrointestinal: Denies abdominal pain Genitourinary: Negative for dysuria. Musculoskeletal: Negative for back pain. Skin: Negative for rash. Psychiatric: no mood changes,     ____________________________________________   PHYSICAL EXAM:  VITAL SIGNS: ED Triage Vitals  Enc Vitals Group     BP 12/02/19 1631 (!) 136/46     Pulse Rate 12/02/19 1631 87     Resp 12/02/19 1631 20     Temp 12/02/19 1700 99.1 F (37.3 C)     Temp Source  12/02/19 1631 Oral     SpO2 12/02/19 1631 98 %     Weight 12/02/19 1633 284 lb (128.8 kg)     Height 12/02/19 1633 5\' 6"  (1.676 m)     Head Circumference --      Peak Flow --      Pain Score 12/02/19 1700 0     Pain Loc --      Pain Edu? --      Excl. in GC? --     Constitutional: Alert and oriented. Well appearing and in no acute distress. Eyes: Conjunctivae are normal.  Head: Atraumatic. Nose: No congestion/rhinnorhea. Mouth/Throat: Mucous membranes are moist.   Neck:  supple no lymphadenopathy noted Cardiovascular: Normal rate, regular rhythm. Heart sounds are normal Respiratory: Normal respiratory effort.  No retractions, lungs c t a  GU: deferred Musculoskeletal: FROM all extremities, warm and well perfused Neurologic:  Normal speech and language.  Skin:  Skin is warm, dry and intact. No rash noted. Psychiatric: Mood and affect are normal. Speech and behavior are normal.  ____________________________________________   LABS (all labs ordered are listed, but only abnormal results are displayed)  Labs Reviewed - No data to display ____________________________________________   ____________________________________________  RADIOLOGY    ____________________________________________   PROCEDURES  Procedure(s) performed: No  Procedures    ____________________________________________   INITIAL IMPRESSION / ASSESSMENT AND PLAN / ED COURSE  Pertinent labs & imaging results that were available during my care of the patient were reviewed by me and considered in my medical decision making (see chart for details).   Patient is 44 year old female presents emergency department with itching.  See HPI physical exam shows patient to appear well.  No rashes noted.  No difficulty breathing, lungs are clear to all station.  I explained the findings to the patient.  Could be due to the Valtrex medication.  Encouraged her to stop this medication and gave her an injection of  Decadron prior to discharge.  She was given prescriptions for Sterapred and Atarax for itching.  These were sent the medication management at her request.  She was discharged in stable condition.      As part of my medical decision making, I reviewed the following data within the electronic MEDICAL RECORD NUMBER Nursing notes reviewed and incorporated, Old chart reviewed, Notes from prior ED visits and [place] Controlled Substance Database  ____________________________________________   FINAL CLINICAL IMPRESSION(S) / ED DIAGNOSES  Final diagnoses:  Itching due to drug      NEW MEDICATIONS STARTED DURING THIS VISIT:  New Prescriptions   HYDROXYZINE (ATARAX/VISTARIL) 10 MG TABLET    Take 1 tablet (10 mg total) by mouth 3 (three) times daily as needed.   PREDNISONE (STERAPRED UNI-PAK 21 TAB) 10 MG (21) TBPK TABLET    Take 6 pills on day one then decrease by 1 pill each day     Note:  This document was prepared using 49  and may include unintentional dictation errors.    Faythe Ghee, PA-C 12/02/19 Wallace Going, MD 12/02/19 2014

## 2019-12-02 NOTE — ED Triage Notes (Signed)
Pt has been on valtrex for 7 days for shingles.  Pt has itching all over since taking the medicine.  No rash.  No resp distress, just itching.  Pt using cortisone cream without relief.  Pt alert.  Speech clear.

## 2019-12-02 NOTE — Discharge Instructions (Addendum)
Follow-up with your regular doctor if not improving in 1 to 2 days.  Stop taking the Valtrex.  Follow-up for your mammogram.  Return if worsening.  You will start your steroid pack tomorrow.

## 2019-12-08 ENCOUNTER — Ambulatory Visit: Payer: Self-pay | Attending: Oncology

## 2019-12-08 ENCOUNTER — Other Ambulatory Visit: Payer: Self-pay

## 2019-12-08 ENCOUNTER — Ambulatory Visit
Admission: RE | Admit: 2019-12-08 | Discharge: 2019-12-08 | Disposition: A | Discharge status: Home / Self Care | Payer: Self-pay | Source: Ambulatory Visit | Attending: Oncology | Admitting: Oncology

## 2019-12-08 ENCOUNTER — Observation Stay
Admission: EM | Admit: 2019-12-08 | Discharge: 2019-12-09 | Disposition: A | Discharge status: Home / Self Care | Payer: Self-pay | Attending: Internal Medicine | Admitting: Internal Medicine

## 2019-12-08 ENCOUNTER — Encounter: Payer: Self-pay | Admitting: Emergency Medicine

## 2019-12-08 VITALS — BP 152/76 | HR 73 | Temp 97.4°F | Ht 68.0 in | Wt 275.0 lb

## 2019-12-08 DIAGNOSIS — N179 Acute kidney failure, unspecified: Secondary | ICD-10-CM

## 2019-12-08 DIAGNOSIS — R112 Nausea with vomiting, unspecified: Secondary | ICD-10-CM

## 2019-12-08 DIAGNOSIS — E119 Type 2 diabetes mellitus without complications: Secondary | ICD-10-CM

## 2019-12-08 DIAGNOSIS — N644 Mastodynia: Secondary | ICD-10-CM

## 2019-12-08 DIAGNOSIS — E1165 Type 2 diabetes mellitus with hyperglycemia: Secondary | ICD-10-CM

## 2019-12-08 DIAGNOSIS — N200 Calculus of kidney: Secondary | ICD-10-CM | POA: Insufficient documentation

## 2019-12-08 DIAGNOSIS — N132 Hydronephrosis with renal and ureteral calculous obstruction: Secondary | ICD-10-CM

## 2019-12-08 DIAGNOSIS — Z20822 Contact with and (suspected) exposure to covid-19: Secondary | ICD-10-CM | POA: Insufficient documentation

## 2019-12-08 DIAGNOSIS — N12 Tubulo-interstitial nephritis, not specified as acute or chronic: Principal | ICD-10-CM | POA: Insufficient documentation

## 2019-12-08 DIAGNOSIS — E66813 Obesity, class 3: Secondary | ICD-10-CM

## 2019-12-08 DIAGNOSIS — Z794 Long term (current) use of insulin: Secondary | ICD-10-CM | POA: Insufficient documentation

## 2019-12-08 DIAGNOSIS — N201 Calculus of ureter: Secondary | ICD-10-CM

## 2019-12-08 DIAGNOSIS — N10 Acute tubulo-interstitial nephritis: Secondary | ICD-10-CM

## 2019-12-08 DIAGNOSIS — Z79899 Other long term (current) drug therapy: Secondary | ICD-10-CM | POA: Insufficient documentation

## 2019-12-08 DIAGNOSIS — N1330 Unspecified hydronephrosis: Secondary | ICD-10-CM | POA: Insufficient documentation

## 2019-12-08 DIAGNOSIS — R8281 Pyuria: Secondary | ICD-10-CM | POA: Insufficient documentation

## 2019-12-08 DIAGNOSIS — R748 Abnormal levels of other serum enzymes: Secondary | ICD-10-CM

## 2019-12-08 LAB — BASIC METABOLIC PANEL
Anion gap: 14 (ref 5–15)
BUN: 22 mg/dL — ABNORMAL HIGH (ref 6–20)
CO2: 25 mmol/L (ref 22–32)
Calcium: 9.2 mg/dL (ref 8.9–10.3)
Chloride: 95 mmol/L — ABNORMAL LOW (ref 98–111)
Creatinine, Ser: 1.43 mg/dL — ABNORMAL HIGH (ref 0.44–1.00)
GFR calc Af Amer: 51 mL/min — ABNORMAL LOW (ref >60)
GFR calc non Af Amer: 44 mL/min — ABNORMAL LOW (ref >60)
Glucose, Bld: 376 mg/dL — ABNORMAL HIGH (ref 70–99)
Potassium: 3.7 mmol/L (ref 3.5–5.1)
Sodium: 134 mmol/L — ABNORMAL LOW (ref 135–145)

## 2019-12-08 LAB — CBC
HCT: 34.1 % — ABNORMAL LOW (ref 36.0–46.0)
Hemoglobin: 11.5 g/dL — ABNORMAL LOW (ref 12.0–15.0)
MCH: 23 pg — ABNORMAL LOW (ref 26.0–34.0)
MCHC: 33.7 g/dL (ref 30.0–36.0)
MCV: 68.2 fL — ABNORMAL LOW (ref 80.0–100.0)
Platelets: 399 10*3/uL (ref 150–400)
RBC: 5 MIL/uL (ref 3.87–5.11)
RDW: 16.4 % — ABNORMAL HIGH (ref 11.5–15.5)
WBC: 19.3 10*3/uL — ABNORMAL HIGH (ref 4.0–10.5)
nRBC: 0 % (ref 0.0–0.2)

## 2019-12-08 MED ORDER — SODIUM CHLORIDE 0.9 % IV BOLUS
1000.0000 mL | Freq: Once | INTRAVENOUS | Status: AC
  Administered 2019-12-09: 1000 mL via INTRAVENOUS

## 2019-12-08 MED ORDER — DROPERIDOL 2.5 MG/ML IJ SOLN
2.5000 mg | Freq: Once | INTRAMUSCULAR | Status: AC
  Administered 2019-12-09: 2.5 mg via INTRAVENOUS
  Filled 2019-12-08: qty 2

## 2019-12-08 MED ORDER — MORPHINE SULFATE (PF) 4 MG/ML IV SOLN
4.0000 mg | Freq: Once | INTRAVENOUS | Status: AC
  Administered 2019-12-09: 4 mg via INTRAVENOUS
  Filled 2019-12-08: qty 1

## 2019-12-08 NOTE — ED Notes (Signed)
Jeannett Senior RN notified patient coming to room 18

## 2019-12-08 NOTE — Progress Notes (Signed)
[facility] data sheet complete.  Thank you Christy!!! 

## 2019-12-08 NOTE — ED Triage Notes (Signed)
Pt arrived via POV with c/o right flank pain since this afternoon, was told a couple of months ago she had kidney stones on XR to back, states she doesn't know which side they were on.  Pt states the pain radiating towards lower abdomen.  Denies any dysuria but does note dark urine.

## 2019-12-08 NOTE — Progress Notes (Signed)
  Subjective:     Patient ID: [name], female   DOB: [birth year], 44 y.o.   MRN: [id number]  HPI   Review of Systems     Objective:   Physical Exam Chest:     Breasts:        Right: Tenderness present. No swelling, bleeding, inverted nipple, mass, nipple discharge or skin change.        Left: No swelling, bleeding, inverted nipple, mass, nipple discharge, skin change or tenderness.          Assessment:     44 year old patient returns for [facility] screening.  Complains of right breast pain x 1 month.  Patient was seen in ED and treated with Valtrex for possible shingles.  Had reaction of generalized itching.  Never experienced rash.  Patient screened, and meets [facility] eligibility.  Patient does not have insurance, Medicare or Medicaid. Instructed patient on breast self awareness using teach back method. Clinical breast exam reveals pain, rated 5, lower inner right breast.  Intensified on palpation.  Risk Assessment    Risk Scores      12/08/2019   Last edited by: Jim Like, RN   5-year risk: 0.9 %   Lifetime risk: 9.2 %            Plan:     Sent for bilateral diagnostic mammogram, and ultrasound.

## 2019-12-08 NOTE — ED Provider Notes (Signed)
[facility] Emergency Department Provider Note  ____________________________________________   First MD Initiated Contact with Patient 12/08/19 2323     (approximate)  I have reviewed the triage vital signs and the nursing notes.   HISTORY  Chief Complaint Flank Pain    HPI [name] is a 44 y.o. female  with medical history as listed below who presents for evaluation of acute onset and severe right-sided abdominal pain and right sided flank pain as well as nausea and vomiting.  She says she has had similar though milder pain in the past and was told she had kidney stones.  The pain started after eating lunch this afternoon it has been constant but became much more severe just prior to coming into the emergency department.  It is constant and she cannot find a position of comfort.  Nothing in particular makes the symptoms better or worse.  She has not had any dysuria but reports that her urine looks darker than usual.  She is status post cholecystectomy but has not had any other abdominal surgeries.  She says that there is no chance she could be pregnant.  She denies fever/chills, sore throat, chest pain, shortness of breath.          Past Medical History:  Diagnosis Date  . Diabetes mellitus without complication (HCC)   . GERD (gastroesophageal reflux disease)   . Headache   . History of nephrolithiasis 2015   via CT imaging, including an obstructing stone  . Hyperlipidemia   . Hypertension   . Neuromuscular disorder [facility])     Patient Active Problem List   Diagnosis Date Noted  . Acute pyelonephritis 12/09/2019  . Hyperglycemia due to type 2 diabetes mellitus (HCC) 12/09/2019  . AKI (acute kidney injury) (HCC) 12/09/2019  . Hydronephrosis with renal and ureteral calculus obstruction 12/09/2019  . Peripheral neuropathy 03/19/2019  . Blurry vision 01/27/2019  . Hyperlipidemia associated with type 2 diabetes mellitus (HCC) 11/13/2018  .  Rash 11/13/2018  . Headache 01/16/2018  . Back pain 01/16/2018  . Obesity, Class III, BMI 40-49.9 (morbid obesity) (HCC) 05/01/2016  . Depression 05/01/2016  . Metrorrhagia 11/10/2015  . Diabetes (HCC) 03/10/2015  . Anemia 03/10/2015  . Edema 03/10/2015    Past Surgical History:  Procedure Laterality Date  . CHOLECYSTECTOMY  1998    Prior to Admission medications   Medication Sig Start Date End Date Taking? Authorizing Provider  acetaminophen (TYLENOL) 500 MG tablet Take 500 mg by mouth as needed.    [provider]  atorvastatin (LIPITOR) 40 MG tablet Take 1 tablet (40 mg total) by mouth daily. 06/29/19 07/29/19  [name], [name], NP  hydrocortisone cream 1 % Apply 1 application topically as needed for itching. Rash on arms    [provider]  hydrOXYzine (ATARAX/VISTARIL) 10 MG tablet Take 1 tablet (10 mg total) by mouth 3 (three) times daily as needed. 12/02/19   Fisher, Roselyn Bering, PA-C  ibuprofen (ADVIL) 200 MG tablet Take 600 mg by mouth at bedtime as needed for headache.    [provider]  insulin glargine (LANTUS) 100 UNIT/ML injection Inject 0.66 mLs (66 Units total) into the skin at bedtime. Inject up to 51 units every night, increase by 3 units every 3 days until blood sugar in the morning is between 80-130 mg/dl. 11/24/19 02/22/20  [name], [name], NP  liraglutide (VICTOZA) 18 MG/3ML SOPN Inject 0.3 mLs (1.8 mg total) into the skin every morning. 02/12/19  Michiel Cowboy A, PA-C  predniSONE (STERAPRED UNI-PAK 21 TAB) 10 MG (21) TBPK tablet Take 6 pills on day one then decrease by 1 pill each day 12/02/19   Faythe Ghee, PA-C    Allergies Penicillins, Topiramate, Topiramate er, and Valtrex [valacyclovir]  Family History  Problem Relation Age of Onset  . Kidney failure Father   . Pancreatic cancer Maternal Grandmother     Social History Social History   Tobacco Use  . Smoking status: Never Smoker  . Smokeless tobacco: Never Used    Vaping Use  . Vaping Use: Never used  Substance Use Topics  . Alcohol use: No  . Drug use: No    Review of Systems Constitutional: No fever/chills Eyes: No visual changes. ENT: No sore throat. Cardiovascular: Denies chest pain. Respiratory: Denies shortness of breath. Gastrointestinal: Right-sided abdominal and flank pain associated with nausea and vomiting.  Also having loose stools. Genitourinary: Negative for dysuria but the patient urine is darker than usual. Musculoskeletal:  Integumentary: Negative for rash. Neurological: Negative for headaches, focal weakness or numbness.   ____________________________________________   PHYSICAL EXAM:  VITAL SIGNS: ED Triage Vitals  Enc Vitals Group     BP 12/08/19 2236 (!) 143/89     Pulse Rate 12/08/19 2236 (S) 84     Resp --      Temp 12/08/19 2236 99.2 F (37.3 C)     Temp Source 12/08/19 2236 Oral     SpO2 12/08/19 2236 98 %     Weight 12/08/19 2238 (!) 124.7 kg (275 lb)     Height 12/08/19 2238 1.727 m (5\' 8" )     Head Circumference --      Peak Flow --      Pain Score 12/08/19 2237 9     Pain Loc --      Pain Edu? --      Excl. in GC? --     Constitutional: Alert and oriented.  Appears extremely uncomfortable and in pain.  Just recently vomited when I went to evaluate her. Eyes: Conjunctivae are normal.  Head: Atraumatic. Nose: No congestion/rhinnorhea. Mouth/Throat: Patient is wearing a mask. Neck: No stridor.  No meningeal signs.   Cardiovascular: Normal rate, regular rhythm. Good peripheral circulation. Grossly normal heart sounds. Respiratory: Normal respiratory effort.  No retractions. Gastrointestinal: Obese.  Soft with mild tenderness to palpation of the right side of the abdomen. Musculoskeletal: Right CVA tenderness to percussion.  No lower extremity tenderness nor edema. No gross deformities of extremities. Neurologic:  Normal speech and language. No gross focal neurologic deficits are appreciated.   Skin:  Skin is warm, dry and intact. Psychiatric: Mood and affect are normal. Speech and behavior are normal.  ____________________________________________   LABS (all labs ordered are listed, but only abnormal results are displayed)  Labs Reviewed  URINALYSIS, COMPLETE (UACMP) WITH MICROSCOPIC - Abnormal; Notable for the following components:      Result Value   Color, Urine STRAW (*)    APPearance CLEAR (*)    Specific Gravity, Urine 1.031 (*)    Glucose, UA >=500 (*)    Leukocytes,Ua TRACE (*)    All other components within normal limits  CBC - Abnormal; Notable for the following components:   WBC 19.3 (*)    Hemoglobin 11.5 (*)    HCT 34.1 (*)    MCV 68.2 (*)    MCH 23.0 (*)    RDW 16.4 (*)    All other components within normal limits  BASIC METABOLIC PANEL - Abnormal; Notable for the following components:   Sodium 134 (*)    Chloride 95 (*)    Glucose, Bld 376 (*)    BUN 22 (*)    Creatinine, Ser 1.43 (*)    GFR calc non Af Amer 44 (*)    GFR calc Af Amer 51 (*)    All other components within normal limits  HEPATIC FUNCTION PANEL - Abnormal; Notable for the following components:   Total Protein 8.2 (*)    Indirect Bilirubin 1.0 (*)    All other components within normal limits  LIPASE, BLOOD - Abnormal; Notable for the following components:   Lipase 72 (*)    All other components within normal limits  SARS CORONAVIRUS 2 BY RT PCR (HOSPITAL ORDER, PERFORMED IN [facility])  URINE CULTURE  LACTIC ACID, PLASMA  PROCALCITONIN  LACTIC ACID, PLASMA  HIV ANTIBODY (ROUTINE TESTING W REFLEX)  POC URINE PREG, ED   ____________________________________________  EKG  ED ECG REPORT I, Loleta Rose, the attending physician, personally viewed and interpreted this ECG.  Date: 12/08/2019 EKG Time: 22: 36 Rate: Eighty-nine Rhythm: Sinus rhythm with short PR interval and frequent PVCs QRS Axis: normal Intervals: Short PR interval of 110 ms ST/T Wave  abnormalities: Non-specific ST segment / T-wave changes, but no clear evidence of acute ischemia. Narrative Interpretation: no definitive evidence of acute ischemia; does not meet STEMI criteria.   ____________________________________________  RADIOLOGY I, Loleta Rose, personally viewed and evaluated these images (plain radiographs) as part of my medical decision making, as well as reviewing the written report by the radiologist.  ED MD interpretation: 5 mm obstructive right mid ureteral stone with significant perinephric and periureteral stranding  Official radiology report(s): CT ABDOMEN PELVIS W CONTRAST  Result Date: 12/09/2019 CLINICAL DATA:  Right-sided flank pain EXAM: CT ABDOMEN AND PELVIS WITH CONTRAST TECHNIQUE: Multidetector CT imaging of the abdomen and pelvis was performed using the standard protocol following bolus administration of intravenous contrast. CONTRAST:  OMNIPAQUE IOHEXOL 300 MG/ML  SOLN COMPARISON:  June 06, 2014 FINDINGS: Lower chest: The visualized heart size within normal limits. No pericardial fluid/thickening. No hiatal hernia. The visualized portions of the lungs are clear. Hepatobiliary: The liver is normal in density without focal abnormality.The main portal vein is patent. The patient is status post cholecystectomy. No biliary ductal dilation. Pancreas: Unremarkable. No pancreatic ductal dilatation or surrounding inflammatory changes. Spleen: Normal in size without focal abnormality. Adrenals/Urinary Tract: Both adrenal glands appear normal. Mild right-sided pelvicaliectasis and ureterectasis is seen at the mid ureter where there is a 5 mm calculus. There is significant right-sided perinephric and proximal periureteral fat stranding changes. There is scattered right-sided renal calculi noted the largest measuring 6 mm within the midpole. There is left-sided renal calculi noted within the lower pole measuring 4 mm. No left-sided hydronephrosis is seen. The  bladder is unremarkable. Stomach/Bowel: The stomach, small bowel, and colon are normal in appearance. No inflammatory changes, wall thickening, or obstructive findings.The appendix is normal. Vascular/Lymphatic: There are no enlarged mesenteric, retroperitoneal, or pelvic lymph nodes. No significant vascular findings are present. Reproductive: The uterus and adnexa are unremarkable. Other: No evidence of abdominal wall mass or hernia. Again noted is skin thickening at the umbilicus. Musculoskeletal: No acute or significant osseous findings. IMPRESSION: 1. 5 mm mid right ureteral calculus causing mild right hydronephrosis with significant right perinephric/periureteral stranding. 2. Nonobstructing bilateral renal calculi Electronically Signed   By: Heywood Bene.D.  On: 12/09/2019 01:12      ____________________________________________   PROCEDURES   Procedure(s) performed (including Critical Care):  .1-3 Lead EKG Interpretation Performed by: Loleta RoseForbach, Ericka Marcellus, MD Authorized by: Loleta RoseForbach, Darrelle Wiberg, MD     Interpretation: abnormal     ECG rate:  90   ECG rate assessment: normal     Rhythm: sinus rhythm     Ectopy: PVCs     Conduction: normal       ____________________________________________   INITIAL IMPRESSION / MDM / ASSESSMENT AND PLAN / ED COURSE  As part of my medical decision making, I reviewed the following data within the electronic MEDICAL RECORD NUMBER Nursing notes reviewed and incorporated, Labs reviewed , EKG interpreted , Old chart reviewed, Discussed with urologist (Dr. Lonna CobbStoioff), Discussed with admitting physician (Dr. Para Marchuncan) and Notes from prior ED visits   Differential diagnosis includes, but is not limited to, renal/ureteral colic, UTI/pyelonephritis, appendicitis, SBO/ileus, ovarian torsion or ovarian cyst.  The patient is on the cardiac monitor to evaluate for evidence of arrhythmia and/or significant heart rate changes given the number of PVCs seen on her initial EKG and  after she is on the monitor.  Electrolytes may be abnormal.  Patient says there is no chance she could be pregnant.  Blood work and urine are pending.  I will obtain a CT scan with IV contrast once her lab work confirms normal renal function; I suspect she is suffering from ureteral colic but IV contrast will help further evaluate for other causes of right-sided abdominal pain such as appendicitis.  Patient has had a substantial mental pain is still actively vomiting.  I have ordered morphine 4 mg IV and droperidol 2.5 mg IV for his analgesia and antiemetic effects.  I have also ordered 1 L normal saline IV bolus.    Clinical Course as of Dec 09 423  Tue Dec 08, 2019  2356 Possibly indicative of acute infection, though could be a stress response as well.  WBC(!): 19.3 [CF]  Wed Dec 09, 2019  0043 Slight elevation of lipase could be the result of acute pancreatitis or simply could represent the persistent nausea and vomiting she has been having.  Lipase(!): 72 [CF]  0044 Labs notable for mild AKI likely in the setting of volume depletion from persistent vomiting.,  Otherwise unremarkable other than hyperglycemia.  Basic metabolic panel(!) [CF]  [id number] Reassuring liver function tests.  Hepatic function panel(!) [CF]  [id number] Initially I was going to give her Toradol 15 mg IV but then I realized that she may be a candidate for lithotripsy so I'm holding on all NSAIDs for now   [CF]  0119 5 mm right-sided mid ureteral stone with significant surrounding stranding.  Still awaiting urinalysis to look for signs of infection, however given her white blood cell count and the severity of her symptoms I am going to ask her nurse to perform in and out catheterization and then likely will treat with ceftriaxone 1 g IV empirically.  CT ABDOMEN PELVIS W CONTRAST [CF]  0216 I discussed the case with Dr. Lonna CobbStoioff with urology by phone.  Given the patient's leukocytosis, acute kidney injury, perinephric and  periureteral stranding with an obstructive stone, and white cells evident on urinalysis, he agreed with my plan for admission to the hospitalist service for treatment of complicated pyelonephritis and he will consult on the patient in the morning.  I have consulted the hospitalist team for admission and I updated the patient and her mother.  I am  also giving her an additional gram of ceftriaxone.   [CF]  0350 Discussed case by phone with Dr. Para March with the hospitalist service who will admit.   [CF]    Clinical Course User Index [CF] Loleta Rose, MD   Of note, this patient does not meet SIRS/sepsis criteria, though I am concerned she is at significant risk of deteriorating if not treated and admitted. Lactic acid and procalcitonin pending at time of admission.  ____________________________________________  FINAL CLINICAL IMPRESSION(S) / ED DIAGNOSES  Final diagnoses:  Right ureteral stone  Kidney stones  Pyelonephritis, acute  Acute kidney injury (HCC)  Nausea and vomiting, intractability of vomiting not specified, unspecified vomiting type  Elevated lipase  Insulin dependent type 2 diabetes mellitus (HCC)     MEDICATIONS GIVEN DURING THIS VISIT:  Medications  insulin glargine (LANTUS) injection 40 Units (has no administration in time range)  0.9 %  sodium chloride infusion ( Intravenous New Bag/Given 12/09/19 0345)  acetaminophen (TYLENOL) tablet 650 mg (has no administration in time range)    Or  acetaminophen (TYLENOL) suppository 650 mg (has no administration in time range)  HYDROmorphone (DILAUDID) injection 1 mg (has no administration in time range)  ondansetron (ZOFRAN) tablet 4 mg (has no administration in time range)    Or  ondansetron (ZOFRAN) injection 4 mg (has no administration in time range)  promethazine (PHENERGAN) injection 12.5 mg (has no administration in time range)  insulin aspart (novoLOG) injection 0-15 Units (has no administration in time range)    insulin aspart (novoLOG) injection 0-5 Units (has no administration in time range)  cefTRIAXone (ROCEPHIN) 1 g in sodium chloride 0.9 % 100 mL IVPB (has no administration in time range)  tamsulosin (FLOMAX) capsule 0.4 mg (has no administration in time range)  droperidol (INAPSINE) 2.5 MG/ML injection 2.5 mg (2.5 mg Intravenous Given 12/09/19 0016)  morphine 4 MG/ML injection 4 mg (4 mg Intravenous Given 12/09/19 0016)  sodium chloride 0.9 % bolus 1,000 mL (0 mLs Intravenous Stopped 12/09/19 0230)  iohexol (OMNIPAQUE) 300 MG/ML solution 100 mL (100 mLs Intravenous Contrast Given 12/09/19 0051)  cefTRIAXone (ROCEPHIN) 1 g in sodium chloride 0.9 % 100 mL IVPB (0 g Intravenous Stopped 12/09/19 0230)  cefTRIAXone (ROCEPHIN) 1 g in sodium chloride 0.9 % 100 mL IVPB (0 g Intravenous Stopped 12/09/19 0345)     ED Discharge Orders    None      *Please note:  Denijah Lu Paradise was evaluated in Emergency Department on 12/09/2019 for the symptoms described in the history of present illness. She was evaluated in the context of the global COVID-19 pandemic, which necessitated consideration that the patient might be at risk for infection with the SARS-CoV-2 virus that causes COVID-19. Institutional protocols and algorithms that pertain to the evaluation of patients at risk for COVID-19 are in a state of rapid change based on information released by regulatory bodies including the CDC and federal and state organizations. These policies and algorithms were followed during the patient's care in the ED.  Some ED evaluations and interventions may be delayed as a result of limited staffing during and after the pandemic.*  Note:  This document was prepared using Dragon voice recognition software and may include unintentional dictation errors.   Loleta Rose, MD 12/09/19 [phone]

## 2019-12-08 NOTE — ED Notes (Signed)
EKG completed in triage, pt found to have irregular heart rate on dinamap and when palpated, pt has no known history of heart problems. EKG showed to Dr. Katrinka Blazing said patient needed to be room next.

## 2019-12-09 ENCOUNTER — Observation Stay: Payer: Self-pay | Admitting: Registered Nurse

## 2019-12-09 ENCOUNTER — Emergency Department: Payer: Self-pay

## 2019-12-09 ENCOUNTER — Observation Stay: Payer: Self-pay

## 2019-12-09 ENCOUNTER — Other Ambulatory Visit: Payer: Self-pay

## 2019-12-09 ENCOUNTER — Encounter: Payer: Self-pay | Admitting: Radiology

## 2019-12-09 ENCOUNTER — Encounter
Admission: EM | Disposition: A | Discharge status: Home / Self Care | Payer: Self-pay | Source: Home / Self Care | Attending: Emergency Medicine

## 2019-12-09 DIAGNOSIS — N132 Hydronephrosis with renal and ureteral calculous obstruction: Secondary | ICD-10-CM

## 2019-12-09 DIAGNOSIS — E1165 Type 2 diabetes mellitus with hyperglycemia: Secondary | ICD-10-CM

## 2019-12-09 DIAGNOSIS — N10 Acute tubulo-interstitial nephritis: Secondary | ICD-10-CM

## 2019-12-09 DIAGNOSIS — N390 Urinary tract infection, site not specified: Secondary | ICD-10-CM

## 2019-12-09 DIAGNOSIS — N179 Acute kidney failure, unspecified: Secondary | ICD-10-CM

## 2019-12-09 HISTORY — PX: CYSTOSCOPY WITH STENT PLACEMENT: SHX5790

## 2019-12-09 LAB — URINALYSIS, COMPLETE (UACMP) WITH MICROSCOPIC
Bacteria, UA: NONE SEEN
Bilirubin Urine: NEGATIVE
Glucose, UA: >=500 mg/dL — AB
Hgb urine dipstick: NEGATIVE
Ketones, ur: NEGATIVE mg/dL
Nitrite: NEGATIVE
Protein, ur: NEGATIVE mg/dL
Specific Gravity, Urine: 1.031 — ABNORMAL HIGH (ref 1.005–1.030)
pH: 7 (ref 5.0–8.0)

## 2019-12-09 LAB — PROCALCITONIN: Procalcitonin: <0.1 ng/mL

## 2019-12-09 LAB — GLUCOSE, CAPILLARY
Glucose-Capillary: 332 mg/dL — ABNORMAL HIGH (ref 70–99)
Glucose-Capillary: 223 mg/dL — ABNORMAL HIGH (ref 70–99)
Glucose-Capillary: 255 mg/dL — ABNORMAL HIGH (ref 70–99)
Glucose-Capillary: 208 mg/dL — ABNORMAL HIGH (ref 70–99)

## 2019-12-09 LAB — HEPATIC FUNCTION PANEL
ALT: 17 U/L (ref 0–44)
AST: 16 U/L (ref 15–41)
Albumin: 4.2 g/dL (ref 3.5–5.0)
Alkaline Phosphatase: 102 U/L (ref 38–126)
Bilirubin, Direct: 0.1 mg/dL (ref 0.0–0.2)
Indirect Bilirubin: 1 mg/dL — ABNORMAL HIGH (ref 0.3–0.9)
Total Bilirubin: 1.1 mg/dL (ref 0.3–1.2)
Total Protein: 8.2 g/dL — ABNORMAL HIGH (ref 6.5–8.1)

## 2019-12-09 LAB — PREGNANCY, URINE: Preg Test, Ur: NEGATIVE

## 2019-12-09 LAB — LACTIC ACID, PLASMA
Lactic Acid, Venous: 1.8 mmol/L (ref 0.5–1.9)
Lactic Acid, Venous: 1.9 mmol/L (ref 0.5–1.9)

## 2019-12-09 LAB — SARS CORONAVIRUS 2 BY RT PCR (HOSPITAL ORDER, PERFORMED IN ~~LOC~~ HOSPITAL LAB): SARS Coronavirus 2: NEGATIVE

## 2019-12-09 LAB — HIV ANTIBODY (ROUTINE TESTING W REFLEX): HIV Screen 4th Generation wRfx: NONREACTIVE

## 2019-12-09 LAB — LIPASE, BLOOD: Lipase: 72 U/L — ABNORMAL HIGH (ref 11–51)

## 2019-12-09 SURGERY — CYSTOSCOPY, WITH STENT INSERTION
Anesthesia: General | Laterality: Right

## 2019-12-09 MED ORDER — SODIUM CHLORIDE 0.9 % IV SOLN
1.0000 g | INTRAVENOUS | Status: AC
  Administered 2019-12-09: 1 g via INTRAVENOUS
  Filled 2019-12-09: qty 10

## 2019-12-09 MED ORDER — FENTANYL CITRATE (PF) 100 MCG/2ML IJ SOLN: 25.0000 ug | INTRAMUSCULAR | Status: DC | PRN

## 2019-12-09 MED ORDER — PROPOFOL 10 MG/ML IV BOLUS
INTRAVENOUS | Status: DC | PRN
  Administered 2019-12-09: 150 mg via INTRAVENOUS

## 2019-12-09 MED ORDER — DEXAMETHASONE SODIUM PHOSPHATE 10 MG/ML IJ SOLN
INTRAMUSCULAR | Status: AC
  Filled 2019-12-09: qty 1

## 2019-12-09 MED ORDER — IOHEXOL 300 MG/ML  SOLN
100.0000 mL | Freq: Once | INTRAMUSCULAR | Status: AC | PRN
  Administered 2019-12-09: 100 mL via INTRAVENOUS

## 2019-12-09 MED ORDER — MIDAZOLAM HCL 2 MG/2ML IJ SOLN
INTRAMUSCULAR | Status: DC | PRN
  Administered 2019-12-09: 2 mg via INTRAVENOUS

## 2019-12-09 MED ORDER — PHENYLEPHRINE HCL (PRESSORS) 10 MG/ML IV SOLN
INTRAVENOUS | Status: DC | PRN
  Administered 2019-12-09 (×2): 200 ug via INTRAVENOUS

## 2019-12-09 MED ORDER — OXYCODONE HCL 5 MG PO TABS: 5.0000 mg | ORAL_TABLET | Freq: Three times a day (TID) | ORAL | 0 refills | Status: AC | PRN

## 2019-12-09 MED ORDER — PROMETHAZINE HCL 25 MG/ML IJ SOLN: 12.5000 mg | INTRAMUSCULAR | Status: DC | PRN

## 2019-12-09 MED ORDER — SUCCINYLCHOLINE CHLORIDE 200 MG/10ML IV SOSY
PREFILLED_SYRINGE | INTRAVENOUS | Status: AC
  Filled 2019-12-09: qty 10

## 2019-12-09 MED ORDER — FENTANYL CITRATE (PF) 100 MCG/2ML IJ SOLN
INTRAMUSCULAR | Status: DC | PRN
  Administered 2019-12-09: 100 ug via INTRAVENOUS

## 2019-12-09 MED ORDER — IOPAMIDOL (ISOVUE-200) INJECTION 41%
INTRAVENOUS | Status: DC | PRN
  Administered 2019-12-09: 15 mL via INTRAVENOUS

## 2019-12-09 MED ORDER — KETOROLAC TROMETHAMINE 30 MG/ML IJ SOLN
15.0000 mg | Freq: Once | INTRAMUSCULAR | Status: DC
  Filled 2019-12-09: qty 1

## 2019-12-09 MED ORDER — CEPHALEXIN 500 MG PO CAPS: 500.0000 mg | ORAL_CAPSULE | Freq: Three times a day (TID) | ORAL | 0 refills | Status: AC

## 2019-12-09 MED ORDER — SODIUM CHLORIDE 0.9 % IV SOLN: INTRAVENOUS | Status: DC

## 2019-12-09 MED ORDER — EPHEDRINE SULFATE 50 MG/ML IJ SOLN
INTRAMUSCULAR | Status: DC | PRN
  Administered 2019-12-09: 20 mg via INTRAVENOUS

## 2019-12-09 MED ORDER — ACETAMINOPHEN 10 MG/ML IV SOLN
INTRAVENOUS | Status: AC
  Filled 2019-12-09: qty 100

## 2019-12-09 MED ORDER — ONDANSETRON HCL 4 MG PO TABS: 4.0000 mg | ORAL_TABLET | Freq: Four times a day (QID) | ORAL | Status: DC | PRN

## 2019-12-09 MED ORDER — GLYCOPYRROLATE 0.2 MG/ML IJ SOLN
INTRAMUSCULAR | Status: AC
  Filled 2019-12-09: qty 1

## 2019-12-09 MED ORDER — INSULIN ASPART 100 UNIT/ML ~~LOC~~ SOLN
0.0000 [IU] | Freq: Three times a day (TID) | SUBCUTANEOUS | Status: DC
  Administered 2019-12-09: 11 [IU] via SUBCUTANEOUS
  Filled 2019-12-09: qty 1

## 2019-12-09 MED ORDER — INSULIN GLARGINE 100 UNIT/ML ~~LOC~~ SOLN
40.0000 [IU] | Freq: Every day | SUBCUTANEOUS | Status: DC
  Filled 2019-12-09 (×2): qty 0.4

## 2019-12-09 MED ORDER — PROPOFOL 10 MG/ML IV BOLUS
INTRAVENOUS | Status: AC
  Filled 2019-12-09: qty 20

## 2019-12-09 MED ORDER — ONDANSETRON HCL 4 MG/2ML IJ SOLN: 4.0000 mg | Freq: Once | INTRAMUSCULAR | Status: DC | PRN

## 2019-12-09 MED ORDER — TAMSULOSIN HCL 0.4 MG PO CAPS
0.4000 mg | ORAL_CAPSULE | Freq: Every day | ORAL | Status: DC
  Administered 2019-12-09: 0.4 mg via ORAL
  Filled 2019-12-09: qty 1

## 2019-12-09 MED ORDER — ACETAMINOPHEN 325 MG PO TABS: 650.0000 mg | ORAL_TABLET | Freq: Four times a day (QID) | ORAL | Status: DC | PRN

## 2019-12-09 MED ORDER — INSULIN GLARGINE 100 UNIT/ML ~~LOC~~ SOLN
40.0000 [IU] | Freq: Every day | SUBCUTANEOUS | Status: DC
  Filled 2019-12-09: qty 0.4

## 2019-12-09 MED ORDER — ACETAMINOPHEN 650 MG RE SUPP: 650.0000 mg | Freq: Four times a day (QID) | RECTAL | Status: DC | PRN

## 2019-12-09 MED ORDER — SODIUM CHLORIDE 0.9 % IV SOLN: 1.0000 g | INTRAVENOUS | Status: DC

## 2019-12-09 MED ORDER — HYDROMORPHONE HCL 1 MG/ML IJ SOLN: 1.0000 mg | INTRAMUSCULAR | Status: DC | PRN

## 2019-12-09 MED ORDER — INSULIN ASPART 100 UNIT/ML ~~LOC~~ SOLN: 0.0000 [IU] | Freq: Every day | SUBCUTANEOUS | Status: DC

## 2019-12-09 MED ORDER — SUCCINYLCHOLINE CHLORIDE 20 MG/ML IJ SOLN
INTRAMUSCULAR | Status: DC | PRN
  Administered 2019-12-09: 100 mg via INTRAVENOUS

## 2019-12-09 MED ORDER — ONDANSETRON HCL 4 MG/2ML IJ SOLN
INTRAMUSCULAR | Status: AC
  Filled 2019-12-09: qty 2

## 2019-12-09 MED ORDER — ONDANSETRON HCL 4 MG/2ML IJ SOLN
4.0000 mg | Freq: Four times a day (QID) | INTRAMUSCULAR | Status: DC | PRN
  Administered 2019-12-09: 4 mg via INTRAVENOUS

## 2019-12-09 MED ORDER — LIDOCAINE HCL (CARDIAC) PF 100 MG/5ML IV SOSY
PREFILLED_SYRINGE | INTRAVENOUS | Status: DC | PRN
  Administered 2019-12-09: 100 mg via INTRAVENOUS

## 2019-12-09 MED ORDER — OXYCODONE HCL 5 MG/5ML PO SOLN: 5.0000 mg | Freq: Once | ORAL | Status: DC | PRN

## 2019-12-09 MED ORDER — MIDAZOLAM HCL 2 MG/2ML IJ SOLN
INTRAMUSCULAR | Status: AC
  Filled 2019-12-09: qty 2

## 2019-12-09 MED ORDER — PHENYLEPHRINE HCL (PRESSORS) 10 MG/ML IV SOLN
INTRAVENOUS | Status: AC
  Filled 2019-12-09: qty 1

## 2019-12-09 MED ORDER — LIDOCAINE HCL (PF) 2 % IJ SOLN
INTRAMUSCULAR | Status: AC
  Filled 2019-12-09: qty 5

## 2019-12-09 MED ORDER — FENTANYL CITRATE (PF) 100 MCG/2ML IJ SOLN
INTRAMUSCULAR | Status: AC
  Filled 2019-12-09: qty 2

## 2019-12-09 MED ORDER — DEXAMETHASONE SODIUM PHOSPHATE 10 MG/ML IJ SOLN
INTRAMUSCULAR | Status: DC | PRN
  Administered 2019-12-09: 10 mg via INTRAVENOUS

## 2019-12-09 MED ORDER — OXYCODONE HCL 5 MG PO TABS: 5.0000 mg | ORAL_TABLET | Freq: Once | ORAL | Status: DC | PRN

## 2019-12-09 MED ORDER — GLYCOPYRROLATE 0.2 MG/ML IJ SOLN
INTRAMUSCULAR | Status: DC | PRN
  Administered 2019-12-09: .2 mg via INTRAVENOUS

## 2019-12-09 SURGICAL SUPPLY — 21 items
BAG DRAIN CYSTO-URO LG1000N (MISCELLANEOUS) ×2 IMPLANT
BRUSH SCRUB EZ 1% IODOPHOR (MISCELLANEOUS) ×2 IMPLANT
CATH URETL 5X70 OPEN END (CATHETERS) IMPLANT
CNTNR SPEC 2.5X3XGRAD LEK (MISCELLANEOUS) ×1
CONT SPEC 4OZ STER OR WHT (MISCELLANEOUS) ×1
CONTAINER SPEC 2.5X3XGRAD LEK (MISCELLANEOUS) ×1 IMPLANT
GLOVE BIOGEL PI IND STRL 7.5 (GLOVE) ×1 IMPLANT
GLOVE BIOGEL PI INDICATOR 7.5 (GLOVE) ×1
GOWN STRL REUS W/ TWL XL LVL3 (GOWN DISPOSABLE) ×1 IMPLANT
GOWN STRL REUS W/TWL XL LVL3 (GOWN DISPOSABLE) ×1
GUIDEWIRE STR DUAL SENSOR (WIRE) ×2 IMPLANT
KIT TURNOVER CYSTO (KITS) ×2 IMPLANT
PACK CYSTO AR (MISCELLANEOUS) ×2 IMPLANT
SET CYSTO W/LG BORE CLAMP LF (SET/KITS/TRAYS/PACK) ×2 IMPLANT
SOL .9 NS 3000ML IRR  AL (IV SOLUTION) ×1
SOL .9 NS 3000ML IRR UROMATIC (IV SOLUTION) ×1 IMPLANT
STENT URET 6FRX24 CONTOUR (STENTS) IMPLANT
STENT URET 6FRX26 CONTOUR (STENTS) IMPLANT
SURGILUBE 2OZ TUBE FLIPTOP (MISCELLANEOUS) ×2 IMPLANT
SYR 30ML LL (SYRINGE) ×2 IMPLANT
WATER STERILE IRR 1000ML POUR (IV SOLUTION) ×2 IMPLANT

## 2019-12-09 NOTE — Anesthesia Preprocedure Evaluation (Addendum)
Anesthesia Evaluation  Patient identified by MRN, date of birth, ID band Patient awake    Reviewed: Allergy & Precautions, H&P , NPO status , Patient's Chart, lab work & pertinent test results  Airway Mallampati: III       Dental   Pulmonary neg pulmonary ROS,           Cardiovascular hypertension,      Neuro/Psych  Headaches, PSYCHIATRIC DISORDERS Depression    GI/Hepatic Neg liver ROS, GERD  ,  Endo/Other  diabetesMorbid obesity  Renal/GU Renal diseaseAcute pyelonephritis and AKI     Musculoskeletal   Abdominal   Peds  Hematology negative hematology ROS (+)   Anesthesia Other Findings Past Medical History: No date: Diabetes mellitus without complication (HCC) No date: GERD (gastroesophageal reflux disease) No date: Headache 2015: History of nephrolithiasis     Comment:  via CT imaging, including an obstructing stone No date: Hyperlipidemia No date: Hypertension No date: Neuromuscular disorder (HCC)  Past Surgical History: 1998: CHOLECYSTECTOMY  BMI    Body Mass Index: 41.81 kg/m      Reproductive/Obstetrics negative OB ROS                            Anesthesia Physical Anesthesia Plan  ASA: III  Anesthesia Plan: General ETT   Post-op Pain Management:    Induction:   PONV Risk Score and Plan: Ondansetron, Dexamethasone, Midazolam and Treatment may vary due to age or medical condition  Airway Management Planned:   Additional Equipment:   Intra-op Plan:   Post-operative Plan:   Informed Consent: I have reviewed the patients History and Physical, chart, labs and discussed the procedure including the risks, benefits and alternatives for the proposed anesthesia with the patient or authorized representative who has indicated his/her understanding and acceptance.     Dental Advisory Given  Plan Discussed with: Anesthesiologist, CRNA and Surgeon  Anesthesia Plan  Comments:         Anesthesia Quick Evaluation

## 2019-12-09 NOTE — Discharge Summary (Signed)
Physician Discharge Summary  [name] VOZ:[id number] DOB: [birth year] DOA: 12/08/2019  PCP: Jacelyn Pi, NP  Admit date: 12/08/2019 Discharge date: 12/09/2019  Admitted From: Home  Disposition:  Home   Recommendations for Outpatient Follow-up:  1. Follow up with Dr. Lonna Cobb for labs in 2 days 2. Follow up with Urology Dr. Lonna Cobb for stent removal within 2 weeks      Home Health: None  Equipment/Devices: None  Discharge Condition: Good  CODE STATUS: FULL Diet recommendation: Regular  Brief/Interim Summary: [name] is a 44 y.o. F with DM, MO, and nephrolithiasis who presented with 1 day right-sided flank pain with nausea and vomiting.  In the ER, she was hemodynamically stable, but WBC 19 K, urinalysis showed white cells, and CT of the abdomen and pelvis showed a 5 mm right ureteral calculus with hydronephrosis and perinephric stranding.  Started on antibiotics and admitted to the hospital service.        PRINCIPAL HOSPITAL DIAGNOSIS: Pyelonephritis with nephrolithiasis causing hydronephrosis requiring stent    Discharge Diagnoses:   Pyelonephritis complicated by ureterolithiasis causing hydronephrosis requiring stent Admitted and started on IV antibiotics and IV fluids.  Urology were consulted, who recommended cystoscopy with ureteral stent placement.  Postoperatively, her symptoms were resolved, she was comfortable in taking oral diet.  She had heart rate less than 90, normal respirations, blood pressure normal.  She has close follow-up with Dr. Lonna Cobb arranged for 2 days from now for repeat lab work.  Discharge with oral antibiotics, and close follow-up.   Type 2 diabetes, non-insulin-dependent, without complication  Morbid obesity BMI 41        Discharge Instructions  Discharge Instructions    Diet - low sodium heart healthy   Complete by: As directed    Discharge instructions   Complete by: As directed    From Dr.  Maryfrances Bunnell: You were admitted for a kidney stone. Call Dr. Heywood Footman office tomorrow at the number below.Tell them Dr. Lonna Cobb wanted you to have labs drawn (a BMP) on Friday.   Also ask them when your appointment to see Dr. Lonna Cobb is.  Go to his office on Friday for labs and then again next week when your appointment to see him is scheduled.  For the infection in your urine, take cephalexin 500 mg three times daily (with meals) for 5 more days  For pain, take acetaminophen or oxycodone Caution with oxycodone as we discussed  Do NOT take NSAIDs like ibuprofen, aleve motrin or advil until you have had your kidney function checked again  Drink plenty of fluids  Return to the ER for severe pain, vomiting, or new fever   Increase activity slowly   Complete by: As directed    No wound care   Complete by: As directed      Allergies as of 12/09/2019      Reactions   Penicillins Hives   Topiramate    Other reaction(s): Other (See Comments) Worsened migraines, N/V   Topiramate Er Nausea And Vomiting   Intensified Migraines   Valtrex [valacyclovir] Itching      Medication List    STOP taking these medications   ibuprofen 200 MG tablet Commonly known as: ADVIL     TAKE these medications   acetaminophen 500 MG tablet Commonly known as: TYLENOL Take 500 mg by mouth as needed.   cephALEXin 500 MG capsule Commonly known as: KEFLEX Take 1 capsule (500 mg total) by mouth 3 (three) times daily for 5 days.  hydrocortisone cream 1 % Apply 1 application topically as needed for itching. Rash on arms   hydrOXYzine 10 MG tablet Commonly known as: ATARAX/VISTARIL Take 1 tablet (10 mg total) by mouth 3 (three) times daily as needed.   insulin glargine 100 UNIT/ML injection Commonly known as: Lantus Inject 0.66 mLs (66 Units total) into the skin at bedtime. Inject up to 51 units every night, increase by 3 units every 3 days until blood sugar in the morning is between 80-130 mg/dl.    liraglutide 18 MG/3ML Sopn Commonly known as: VICTOZA Inject 0.3 mLs (1.8 mg total) into the skin every morning.   oxyCODONE 5 MG immediate release tablet Commonly known as: Roxicodone Take 1 tablet (5 mg total) by mouth every 8 (eight) hours as needed for up to 5 days.       Follow-up Information    Stoioff, Verna Czech, MD. Call in 1 day(s).   Specialty: Urology Why: Call Dr. Heywood Footman office tomorrow to schedule lab work on Friday Contact information: [address] [place] [address] [phone]              Allergies  Allergen Reactions  . Penicillins Hives  . Topiramate     Other reaction(s): Other (See Comments) Worsened migraines, N/V  . Topiramate Er Nausea And Vomiting    Intensified Migraines   . Valtrex [Valacyclovir] Itching    Consultations:  Urology   Procedures/Studies: CT ABDOMEN PELVIS W CONTRAST  Result Date: 12/09/2019 CLINICAL DATA:  Right-sided flank pain EXAM: CT ABDOMEN AND PELVIS WITH CONTRAST TECHNIQUE: Multidetector CT imaging of the abdomen and pelvis was performed using the standard protocol following bolus administration of intravenous contrast. CONTRAST:  OMNIPAQUE IOHEXOL 300 MG/ML  SOLN COMPARISON:  June 06, 2014 FINDINGS: Lower chest: The visualized heart size within normal limits. No pericardial fluid/thickening. No hiatal hernia. The visualized portions of the lungs are clear. Hepatobiliary: The liver is normal in density without focal abnormality.The main portal vein is patent. The patient is status post cholecystectomy. No biliary ductal dilation. Pancreas: Unremarkable. No pancreatic ductal dilatation or surrounding inflammatory changes. Spleen: Normal in size without focal abnormality. Adrenals/Urinary Tract: Both adrenal glands appear normal. Mild right-sided pelvicaliectasis and ureterectasis is seen at the mid ureter where there is a 5 mm calculus. There is significant right-sided perinephric and  proximal periureteral fat stranding changes. There is scattered right-sided renal calculi noted the largest measuring 6 mm within the midpole. There is left-sided renal calculi noted within the lower pole measuring 4 mm. No left-sided hydronephrosis is seen. The bladder is unremarkable. Stomach/Bowel: The stomach, small bowel, and colon are normal in appearance. No inflammatory changes, wall thickening, or obstructive findings.The appendix is normal. Vascular/Lymphatic: There are no enlarged mesenteric, retroperitoneal, or pelvic lymph nodes. No significant vascular findings are present. Reproductive: The uterus and adnexa are unremarkable. Other: No evidence of abdominal wall mass or hernia. Again noted is skin thickening at the umbilicus. Musculoskeletal: No acute or significant osseous findings. IMPRESSION: 1. 5 mm mid right ureteral calculus causing mild right hydronephrosis with significant right perinephric/periureteral stranding. 2. Nonobstructing bilateral renal calculi Electronically Signed   By: Jonna Clark M.D.   On: 12/09/2019 01:12   DG OR UROLOGY CYSTO IMAGE ([facility] ONLY)  Result Date: 12/09/2019 There is no interpretation for this exam.  This order is for images obtained during a surgical procedure.  Please See "Surgeries" Tab for more information regarding the procedure.   US BREAST LTD UNI RIGHT  INC AXILLA  Result Date: 12/08/2019 CLINICAL DATA:  44 year old female with intermittent sharp pain in lower inner right breast for approximately 1 month. EXAM: DIGITAL DIAGNOSTIC BILATERAL MAMMOGRAM WITH CAD AND TOMO ULTRASOUND RIGHT BREAST COMPARISON:  Previous exam(s). ACR Breast Density Category a: The breast tissue is almost entirely fatty. FINDINGS: A radiopaque BB was placed at the site of the patient's focal pain in the lower inner right breast. No focal or suspicious mammographic abnormalities is seen deep to the radiopaque BB. No suspicious mammographic findings are identified in either  breast. Mammographic images were processed with CAD. Targeted ultrasound is performed, showing normal fibroglandular tissue without focal or suspicious sonographic abnormality. Evaluation of the entire lower inner quadrant was performed. IMPRESSION: 1. No mammographic evidence of malignancy in either breast. 2. No suspicious sonographic findings at the site of the patient's focal right breast pain. RECOMMENDATION: 1. Clinical follow-up recommended for the painful area of concern in the right breast. Any further workup should be based on clinical grounds. Benign causes of breast pain, and possible remedies, were discussed with the patient. Patient was encouraged to follow-up with referring physician if pain became persistent or if a palpable lump/mass developed. 2.  Screening mammogram in one year.(Code:SM-B-01Y) I have discussed the findings and recommendations with the patient. If applicable, a reminder letter will be sent to the patient regarding the next appointment. BI-RADS CATEGORY  1: Negative. Electronically Signed   By: Sande Brothers M.D.   On: 12/08/2019 10:19   MS DIGITAL DIAG TOMO BILAT  Result Date: 12/08/2019 CLINICAL DATA:  44 year old female with intermittent sharp pain in lower inner right breast for approximately 1 month. EXAM: DIGITAL DIAGNOSTIC BILATERAL MAMMOGRAM WITH CAD AND TOMO ULTRASOUND RIGHT BREAST COMPARISON:  Previous exam(s). ACR Breast Density Category a: The breast tissue is almost entirely fatty. FINDINGS: A radiopaque BB was placed at the site of the patient's focal pain in the lower inner right breast. No focal or suspicious mammographic abnormalities is seen deep to the radiopaque BB. No suspicious mammographic findings are identified in either breast. Mammographic images were processed with CAD. Targeted ultrasound is performed, showing normal fibroglandular tissue without focal or suspicious sonographic abnormality. Evaluation of the entire lower inner quadrant was  performed. IMPRESSION: 1. No mammographic evidence of malignancy in either breast. 2. No suspicious sonographic findings at the site of the patient's focal right breast pain. RECOMMENDATION: 1. Clinical follow-up recommended for the painful area of concern in the right breast. Any further workup should be based on clinical grounds. Benign causes of breast pain, and possible remedies, were discussed with the patient. Patient was encouraged to follow-up with referring physician if pain became persistent or if a palpable lump/mass developed. 2.  Screening mammogram in one year.(Code:SM-B-01Y) I have discussed the findings and recommendations with the patient. If applicable, a reminder letter will be sent to the patient regarding the next appointment. BI-RADS CATEGORY  1: Negative. Electronically Signed   By: Sande Brothers M.D.   On: 12/08/2019 10:19      Subjective: Patient feeling well post operatively.  She is tolerating oral diet, she has no fever with confusion.  She has no nausea and is desiring to go home.  Discharge Exam: Vitals:   12/09/19 1335 12/09/19 1750  BP: (!) 119/59 (!) 131/67  Pulse:  75  Resp: (!) 25 21  Temp:  98 F (36.7 C)  SpO2:  99%   Vitals:   12/09/19 1305 12/09/19 1320 12/09/19 1335 12/09/19 1750  BP: Marland Kitchen)  120/60 (!) 112/64 (!) 119/59 (!) 131/67  Pulse: 65 66  75  Resp: 21 20 (!) 25 21  Temp:    98 F (36.7 C)  TempSrc:    Temporal  SpO2: 100% 100%  99%  Weight:      Height:        General: Pt is alert, awake, not in acute distress Cardiovascular: RRR, nl S1-S2, no murmurs appreciated.   No LE edema.   Respiratory: Normal respiratory rate and rhythm.  CTAB without rales or wheezes. Abdominal: Abdomen soft and non-tender.  No distension or HSM.   Neuro/Psych: Strength symmetric in upper and lower extremities.  Judgment and insight appear normal.   The results of significant diagnostics from this hospitalization (including imaging, microbiology, ancillary  and laboratory) are listed below for reference.     Microbiology: Recent Results (from the past 240 hour(s))  SARS Coronavirus 2 by RT PCR (hospital order, performed in [facility] hospital lab) Nasopharyngeal Nasopharyngeal Swab     Status: None   Collection Time: 12/09/19  2:31 AM   Specimen: Nasopharyngeal Swab  Result Value Ref Range Status   SARS Coronavirus 2 NEGATIVE NEGATIVE Final    Comment: (NOTE) SARS-CoV-2 target nucleic acids are NOT DETECTED.  The SARS-CoV-2 RNA is generally detectable in upper and lower respiratory specimens during the acute phase of infection. The lowest concentration of SARS-CoV-2 viral copies this assay can detect is 250 copies / mL. A negative result does not preclude SARS-CoV-2 infection and should not be used as the sole basis for treatment or other patient management decisions.  A negative result may occur with improper specimen collection / handling, submission of specimen other than nasopharyngeal swab, presence of viral mutation(s) within the areas targeted by this assay, and inadequate number of viral copies (<250 copies / mL). A negative result must be combined with clinical observations, patient history, and epidemiological information.  Fact Sheet for Patients:   BoilerBrush.com.cyhttps://www.fda.gov/media/136312/download  Fact Sheet for Healthcare Providers: https://pope.com/https://www.fda.gov/media/136313/download  This test is not yet approved or  cleared by the [place] FDA and has been authorized for detection and/or diagnosis of SARS-CoV-2 by FDA under an Emergency Use Authorization (EUA).  This EUA will remain in effect (meaning this test can be used) for the duration of the COVID-19 declaration under Section 564(b)(1) of the Act, 21 U.S.C. section 360bbb-3(b)(1), unless the authorization is terminated or revoked sooner.  Performed at [facility], [address] [place], [place] [address]      Labs: BNP (last 3 results) No results for  input(s): BNP in the last 8760 hours. Basic Metabolic Panel: Recent Labs  Lab 12/08/19 2335  NA 134*  K 3.7  CL 95*  CO2 25  GLUCOSE 376*  BUN 22*  CREATININE 1.43*  CALCIUM 9.2   Liver Function Tests: Recent Labs  Lab 12/08/19 2335  AST 16  ALT 17  ALKPHOS 102  BILITOT 1.1  PROT 8.2*  ALBUMIN 4.2   Recent Labs  Lab 12/08/19 2335  LIPASE 72*   No results for input(s): AMMONIA in the last 168 hours. CBC: Recent Labs  Lab 12/08/19 2335  WBC 19.3*  HGB 11.5*  HCT 34.1*  MCV 68.2*  PLT 399   Cardiac Enzymes: No results for input(s): CKTOTAL, CKMB, CKMBINDEX, TROPONINI in the last 168 hours. BNP: Invalid input(s): POCBNP CBG: Recent Labs  Lab 12/09/19 0724 12/09/19 1044 12/09/19 1128 12/09/19 1304  GLUCAP 332* 255* 223* 208*   D-Dimer No results for  input(s): DDIMER in the last 72 hours. Hgb A1c No results for input(s): HGBA1C in the last 72 hours. Lipid Profile No results for input(s): CHOL, HDL, LDLCALC, TRIG, CHOLHDL, LDLDIRECT in the last 72 hours. Thyroid function studies No results for input(s): TSH, T4TOTAL, T3FREE, THYROIDAB in the last 72 hours.  Invalid input(s): FREET3 Anemia work up No results for input(s): VITAMINB12, FOLATE, FERRITIN, TIBC, IRON, RETICCTPCT in the last 72 hours. Urinalysis    Component Value Date/Time   COLORURINE STRAW (A) 12/09/2019 0130   APPEARANCEUR CLEAR (A) 12/09/2019 0130   APPEARANCEUR Hazy 06/06/2014 1433   LABSPEC 1.031 (H) 12/09/2019 0130   LABSPEC 1.017 06/06/2014 1433   PHURINE 7.0 12/09/2019 0130   GLUCOSEU >=500 (A) 12/09/2019 0130   GLUCOSEU 150 mg/dL 78/29/5621 3086   HGBUR NEGATIVE 12/09/2019 0130   BILIRUBINUR NEGATIVE 12/09/2019 0130   BILIRUBINUR Negative 06/06/2014 1433   KETONESUR NEGATIVE 12/09/2019 0130   PROTEINUR NEGATIVE 12/09/2019 0130   UROBILINOGEN 0.2 01/23/2014 2221   NITRITE NEGATIVE 12/09/2019 0130   LEUKOCYTESUR TRACE (A) 12/09/2019 0130   LEUKOCYTESUR Negative  06/06/2014 1433   Sepsis Labs Invalid input(s): PROCALCITONIN,  WBC,  LACTICIDVEN Microbiology Recent Results (from the past 240 hour(s))  SARS Coronavirus 2 by RT PCR (hospital order, performed in [facility] hospital lab) Nasopharyngeal Nasopharyngeal Swab     Status: None   Collection Time: 12/09/19  2:31 AM   Specimen: Nasopharyngeal Swab  Result Value Ref Range Status   SARS Coronavirus 2 NEGATIVE NEGATIVE Final    Comment: (NOTE) SARS-CoV-2 target nucleic acids are NOT DETECTED.  The SARS-CoV-2 RNA is generally detectable in upper and lower respiratory specimens during the acute phase of infection. The lowest concentration of SARS-CoV-2 viral copies this assay can detect is 250 copies / mL. A negative result does not preclude SARS-CoV-2 infection and should not be used as the sole basis for treatment or other patient management decisions.  A negative result may occur with improper specimen collection / handling, submission of specimen other than nasopharyngeal swab, presence of viral mutation(s) within the areas targeted by this assay, and inadequate number of viral copies (<250 copies / mL). A negative result must be combined with clinical observations, patient history, and epidemiological information.  Fact Sheet for Patients:   BoilerBrush.com.cy  Fact Sheet for Healthcare Providers: https://pope.com/  This test is not yet approved or  cleared by the [place] FDA and has been authorized for detection and/or diagnosis of SARS-CoV-2 by FDA under an Emergency Use Authorization (EUA).  This EUA will remain in effect (meaning this test can be used) for the duration of the COVID-19 declaration under Section 564(b)(1) of the Act, 21 U.S.C. section 360bbb-3(b)(1), unless the authorization is terminated or revoked sooner.  Performed at [facility], [address] [place], [place] [address]      Time  coordinating discharge: 35 minutes The  controlled substances registry was reviewed for this patient prior to filling the <5 days supply controlled substances script.      SIGNED:   Alberteen Sam, MD  [facility] 12/09/2019, 7:22 PM

## 2019-12-09 NOTE — Transfer of Care (Signed)
Immediate Anesthesia Transfer of Care Note  Patient: [name]  Procedure(s) Performed: CYSTOSCOPY WITH STENT PLACEMENT (Right )  Patient Location: PACU  Anesthesia Type:General  Level of Consciousness: sedated  Airway & Oxygen Therapy: Patient Spontanous Breathing and Patient connected to face mask oxygen  Post-op Assessment: Report given to RN and Post -op Vital signs reviewed and stable  Post vital signs: Reviewed and stable  Last Vitals:  Vitals Value Taken Time  BP 116/62 12/09/19 1251  Temp 36.1 C 12/09/19 1251  Pulse 70 12/09/19 1255  Resp 20 12/09/19 1255  SpO2 100 % 12/09/19 1255  Vitals shown include unvalidated device data.  Last Pain:  Vitals:   12/09/19 1251  TempSrc:   PainSc: Asleep         Complications: No complications documented.

## 2019-12-09 NOTE — Op Note (Signed)
Preoperative diagnosis:  1. Right proximal ureteral calculus with obstruction 2. Pyuria  Postoperative diagnosis:  1. Same  Procedure:  1. Cystoscopy 2. Right ureteral stent placement (6FR) 24 cm 3. Right retrograde pyelography with interpretation   Surgeon: Lorin Picket C. Jadasia Haws, M.D.  Anesthesia: General  Complications: None  Intraoperative findings:   Right retrograde pyelogram-Stone not seen on fluoroscopy.  Mild right hydronephrosis and hydroureter to the lower proximal ureter  Cystoscopy-scattered areas of cystitis cystica.  No solid or papillary lesions.  No efflux noted right ureteral orifice  EBL: Minimal  Specimens: None  Indication: [name] is a 44 y.o. female who presented to the ED last night with flank pain, nausea/vomiting.  UA showed 11-20 WBCs and leukocytosis of 19,000.  Temp was 99 2. After reviewing the management options for treatment, he elected to proceed with the above surgical procedure(s). We have discussed the potential benefits and risks of the procedure, side effects of the proposed treatment, the likelihood of the patient achieving the goals of the procedure, and any potential problems that might occur during the procedure or recuperation. Informed consent has been obtained.  Description of procedure:  The patient was taken to the operating room and general anesthesia was induced.  The patient was placed in the dorsal lithotomy position, prepped and draped in the usual sterile fashion.  Antibiotics were started on admission. A preoperative time-out was performed.   A 21 French cystoscope with 30 degree lens was lubricated and passed per urethra.  Panendoscopy was performed with findings as described above.  A 0.38 sensor guidewire was then advanced up the right ureter into the renal pelvis under fluoroscopic guidance.  A 5 French open-ended ureteral cath was then placed over the wire and advanced through the region of the renal pelvis.   Pink-tinged urine was aspirated which was clear and sent for culture.  Retrograde pyelogram was then performed with findings described above.  The guidewire was replaced and the ureteral catheter was removed.  A 6FR/24 cm Contour ureteral stent was then placed over the guidewire without difficulty.  The proximal tip of the stent was curled in a lower pole calyx.  Distal and the stent was well-positioned.  Brisk efflux blood-tinged urine was noted after stent placement.  The bladder was then emptied and the procedure ended.  The patient appeared to tolerate the procedure well and without complications.  After anesthetic reversal she was transported to the PACU in satisfactory condition.   Plan: Continue IV antibiotics per hospitalist team.  Definitive stone treatment in approximately 2 weeks.

## 2019-12-09 NOTE — H&P (Addendum)
History and Physical    [name] ASN:[id number] DOB: February 03, 1976 DOA: 12/08/2019  PCP: Jacelyn Pi, NP   Patient coming from: Home  I have personally briefly reviewed patient's old medical records in [facility]  Chief Complaint: Right flank pain  HPI: [name] is a 44 y.o. female with medical history significant for diabetes, morbid obesity, and history of kidney stones who presents with acute onset severe right-sided flank pain associated with nausea and vomiting that started after having lunch but became more severe as the day progressed.  Denies fever and chills and denies change in bowel habits and denies dysuria.  Denies chest pain, cough. ED Course: On arrival low-grade temperature of 99.2, pulse 84, BP 143/89 O2 sat 98% on room air.  Blood work with WBC 19,000, hemoglobin 11.5, urinalysis with trace leukocytes, lipase 72, lactic acid not done.  CT abdomen and pelvis showing a 5 mm mid right ureteral calculus causing mild right hydronephrosis with significant right perinephric/periureteral stranding and nonobstructing bilateral renal calculi.  Patient given Rocephin.  ER provider contacted urologist Dr. Lonna Cobb who will see patient in the a.m.  Hospitalist consulted for admission.  Review of Systems: As per HPI otherwise all other systems on review of systems negative.    Past Medical History:  Diagnosis Date  . Diabetes mellitus without complication (HCC)   . GERD (gastroesophageal reflux disease)   . Headache   . History of nephrolithiasis 2015   via CT imaging, including an obstructing stone  . Hyperlipidemia   . Hypertension   . Neuromuscular disorder [facility])     Past Surgical History:  Procedure Laterality Date  . CHOLECYSTECTOMY  1998     reports that she has never smoked. She has never used smokeless tobacco. She reports that she does not drink alcohol and does not use drugs.  Allergies  Allergen Reactions  . Penicillins Hives    . Topiramate     Other reaction(s): Other (See Comments) Worsened migraines, N/V  . Topiramate Er Nausea And Vomiting    Intensified Migraines   . Valtrex [Valacyclovir] Itching    Family History  Problem Relation Age of Onset  . Kidney failure Father   . Pancreatic cancer Maternal Grandmother       Prior to Admission medications   Medication Sig Start Date End Date Taking? Authorizing Provider  acetaminophen (TYLENOL) 500 MG tablet Take 500 mg by mouth as needed.    [provider]  atorvastatin (LIPITOR) 40 MG tablet Take 1 tablet (40 mg total) by mouth daily. 06/29/19 07/29/19  [name], [name], NP  hydrocortisone cream 1 % Apply 1 application topically as needed for itching. Rash on arms    [provider]  hydrOXYzine (ATARAX/VISTARIL) 10 MG tablet Take 1 tablet (10 mg total) by mouth 3 (three) times daily as needed. 12/02/19   Fisher, Roselyn Bering, PA-C  ibuprofen (ADVIL) 200 MG tablet Take 600 mg by mouth at bedtime as needed for headache.    [provider]  insulin glargine (LANTUS) 100 UNIT/ML injection Inject 0.66 mLs (66 Units total) into the skin at bedtime. Inject up to 51 units every night, increase by 3 units every 3 days until blood sugar in the morning is between 80-130 mg/dl. 11/24/19 02/22/20  [name], [name], NP  liraglutide (VICTOZA) 18 MG/3ML SOPN Inject 0.3 mLs (1.8 mg total) into the skin every morning. 02/12/19   McGowan, Carollee Herter A, PA-C  predniSONE (STERAPRED UNI-PAK 21 TAB) 10 MG (21)  TBPK tablet Take 6 pills on day one then decrease by 1 pill each day 12/02/19   Faythe GheeFisher, Susan W, PA-C    Physical Exam: Vitals:   12/08/19 2252 12/08/19 2341 12/08/19 2352 12/09/19 0230  BP:  (!) 147/89  (!) 160/63  Pulse: 83 86 85 83  Resp: 13 19 20 15   Temp:      TempSrc:      SpO2: 97% 93% 99% 95%  Weight:      Height:         Vitals:   12/08/19 2252 12/08/19 2341 12/08/19 2352 12/09/19 0230  BP:  (!) 147/89  (!) 160/63  Pulse: 83 86  85 83  Resp: 13 19 20 15   Temp:      TempSrc:      SpO2: 97% 93% 99% 95%  Weight:      Height:          Constitutional:In moderate pain discomfort HEENT:      Head: Normocephalic and atraumatic.         Eyes: PERLA, EOMI, Conjunctivae are normal. Sclera is non-icteric.       Mouth/Throat: Mucous membranes are moist.       Neck: Supple with no signs of meningismus. Cardiovascular: Regular rate and rhythm . No murmurs, gallops, or rubs. 2+ symmetrical distal pulses are present . No JVD. No  LE edema Respiratory: Respiratory effort normal  .Lungs sounds clear  bilaterally. No  wheezes, crackles, or rhonchi.  Gastrointestinal: Soft, non tender , and non distended with positive bowel sounds. No rebound or guarding. Genitourinary: right CVA tenderness. Musculoskeletal: Nontender with normal range of motion in all extremities. No cyanosis, or erythema of extremities. Neurologic: Normal speech and language. Face is symmetric. Moving all extremities. No gross focal neurologic deficits . Skin: Skin is warm, dry.  No rash or ulcers Psychiatric: Mood and affect are normal Speech and behavior are normal   Labs on Admission: I have personally reviewed following labs and imaging studies  CBC: Recent Labs  Lab 12/08/19 2335  WBC 19.3*  HGB 11.5*  HCT 34.1*  MCV 68.2*  PLT 399   Basic Metabolic Panel: Recent Labs  Lab 12/08/19 2335  NA 134*  K 3.7  CL 95*  CO2 25  GLUCOSE 376*  BUN 22*  CREATININE 1.43*  CALCIUM 9.2   GFR: Estimated Creatinine Clearance: 68.4 mL/min (A) (by C-G formula based on SCr of 1.43 mg/dL (H)). Liver Function Tests: Recent Labs  Lab 12/08/19 2335  AST 16  ALT 17  ALKPHOS 102  BILITOT 1.1  PROT 8.2*  ALBUMIN 4.2   Recent Labs  Lab 12/08/19 2335  LIPASE 72*   No results for input(s): AMMONIA in the last 168 hours. Coagulation Profile: No results for input(s): INR, PROTIME in the last 168 hours. Cardiac Enzymes: No results for  input(s): CKTOTAL, CKMB, CKMBINDEX, TROPONINI in the last 168 hours. BNP (last 3 results) No results for input(s): PROBNP in the last 8760 hours. HbA1C: No results for input(s): HGBA1C in the last 72 hours. CBG: No results for input(s): GLUCAP in the last 168 hours. Lipid Profile: No results for input(s): CHOL, HDL, LDLCALC, TRIG, CHOLHDL, LDLDIRECT in the last 72 hours. Thyroid Function Tests: No results for input(s): TSH, T4TOTAL, FREET4, T3FREE, THYROIDAB in the last 72 hours. Anemia Panel: No results for input(s): VITAMINB12, FOLATE, FERRITIN, TIBC, IRON, RETICCTPCT in the last 72 hours. Urine analysis:    Component Value Date/Time   COLORURINE STRAW (A) 12/09/2019  0130   APPEARANCEUR CLEAR (A) 12/09/2019 0130   APPEARANCEUR Hazy 06/06/2014 1433   LABSPEC 1.031 (H) 12/09/2019 0130   LABSPEC 1.017 06/06/2014 1433   PHURINE 7.0 12/09/2019 0130   GLUCOSEU >=500 (A) 12/09/2019 0130   GLUCOSEU 150 mg/dL 40/98/1191 4782   HGBUR NEGATIVE 12/09/2019 0130   BILIRUBINUR NEGATIVE 12/09/2019 0130   BILIRUBINUR Negative 06/06/2014 1433   KETONESUR NEGATIVE 12/09/2019 0130   PROTEINUR NEGATIVE 12/09/2019 0130   UROBILINOGEN 0.2 01/23/2014 2221   NITRITE NEGATIVE 12/09/2019 0130   LEUKOCYTESUR TRACE (A) 12/09/2019 0130   LEUKOCYTESUR Negative 06/06/2014 1433    Radiological Exams on Admission: CT ABDOMEN PELVIS W CONTRAST  Result Date: 12/09/2019 CLINICAL DATA:  Right-sided flank pain EXAM: CT ABDOMEN AND PELVIS WITH CONTRAST TECHNIQUE: Multidetector CT imaging of the abdomen and pelvis was performed using the standard protocol following bolus administration of intravenous contrast. CONTRAST:  OMNIPAQUE IOHEXOL 300 MG/ML  SOLN COMPARISON:  June 06, 2014 FINDINGS: Lower chest: The visualized heart size within normal limits. No pericardial fluid/thickening. No hiatal hernia. The visualized portions of the lungs are clear. Hepatobiliary: The liver is normal in density without focal  abnormality.The main portal vein is patent. The patient is status post cholecystectomy. No biliary ductal dilation. Pancreas: Unremarkable. No pancreatic ductal dilatation or surrounding inflammatory changes. Spleen: Normal in size without focal abnormality. Adrenals/Urinary Tract: Both adrenal glands appear normal. Mild right-sided pelvicaliectasis and ureterectasis is seen at the mid ureter where there is a 5 mm calculus. There is significant right-sided perinephric and proximal periureteral fat stranding changes. There is scattered right-sided renal calculi noted the largest measuring 6 mm within the midpole. There is left-sided renal calculi noted within the lower pole measuring 4 mm. No left-sided hydronephrosis is seen. The bladder is unremarkable. Stomach/Bowel: The stomach, small bowel, and colon are normal in appearance. No inflammatory changes, wall thickening, or obstructive findings.The appendix is normal. Vascular/Lymphatic: There are no enlarged mesenteric, retroperitoneal, or pelvic lymph nodes. No significant vascular findings are present. Reproductive: The uterus and adnexa are unremarkable. Other: No evidence of abdominal wall mass or hernia. Again noted is skin thickening at the umbilicus. Musculoskeletal: No acute or significant osseous findings. IMPRESSION: 1. 5 mm mid right ureteral calculus causing mild right hydronephrosis with significant right perinephric/periureteral stranding. 2. Nonobstructing bilateral renal calculi Electronically Signed   By: Jonna Clark M.D.   On: 12/09/2019 01:12   US BREAST LTD UNI RIGHT INC AXILLA  Result Date: 12/08/2019 CLINICAL DATA:  44 year old female with intermittent sharp pain in lower inner right breast for approximately 1 month. EXAM: DIGITAL DIAGNOSTIC BILATERAL MAMMOGRAM WITH CAD AND TOMO ULTRASOUND RIGHT BREAST COMPARISON:  Previous exam(s). ACR Breast Density Category a: The breast tissue is almost entirely fatty. FINDINGS: A radiopaque BB was  placed at the site of the patient's focal pain in the lower inner right breast. No focal or suspicious mammographic abnormalities is seen deep to the radiopaque BB. No suspicious mammographic findings are identified in either breast. Mammographic images were processed with CAD. Targeted ultrasound is performed, showing normal fibroglandular tissue without focal or suspicious sonographic abnormality. Evaluation of the entire lower inner quadrant was performed. IMPRESSION: 1. No mammographic evidence of malignancy in either breast. 2. No suspicious sonographic findings at the site of the patient's focal right breast pain. RECOMMENDATION: 1. Clinical follow-up recommended for the painful area of concern in the right breast. Any further workup should be based on clinical grounds. Benign causes of breast pain, and possible remedies,  were discussed with the patient. Patient was encouraged to follow-up with referring physician if pain became persistent or if a palpable lump/mass developed. 2.  Screening mammogram in one year.(Code:SM-B-01Y) I have discussed the findings and recommendations with the patient. If applicable, a reminder letter will be sent to the patient regarding the next appointment. BI-RADS CATEGORY  1: Negative. Electronically Signed   By: Sande Brothers M.D.   On: 12/08/2019 10:19   MS DIGITAL DIAG TOMO BILAT  Result Date: 12/08/2019 CLINICAL DATA:  44 year old female with intermittent sharp pain in lower inner right breast for approximately 1 month. EXAM: DIGITAL DIAGNOSTIC BILATERAL MAMMOGRAM WITH CAD AND TOMO ULTRASOUND RIGHT BREAST COMPARISON:  Previous exam(s). ACR Breast Density Category a: The breast tissue is almost entirely fatty. FINDINGS: A radiopaque BB was placed at the site of the patient's focal pain in the lower inner right breast. No focal or suspicious mammographic abnormalities is seen deep to the radiopaque BB. No suspicious mammographic findings are identified in either breast.  Mammographic images were processed with CAD. Targeted ultrasound is performed, showing normal fibroglandular tissue without focal or suspicious sonographic abnormality. Evaluation of the entire lower inner quadrant was performed. IMPRESSION: 1. No mammographic evidence of malignancy in either breast. 2. No suspicious sonographic findings at the site of the patient's focal right breast pain. RECOMMENDATION: 1. Clinical follow-up recommended for the painful area of concern in the right breast. Any further workup should be based on clinical grounds. Benign causes of breast pain, and possible remedies, were discussed with the patient. Patient was encouraged to follow-up with referring physician if pain became persistent or if a palpable lump/mass developed. 2.  Screening mammogram in one year.(Code:SM-B-01Y) I have discussed the findings and recommendations with the patient. If applicable, a reminder letter will be sent to the patient regarding the next appointment. BI-RADS CATEGORY  1: Negative. Electronically Signed   By: Sande Brothers M.D.   On: 12/08/2019 10:19    EKG: Independently reviewed. Interpretation : Sinus rhythm with PVCs  Assessment/Plan 44 year old female with history of diabetes, morbid obesity, and history of kidney stones who presents with acute onset severe right-sided flank pain associated with nausea and vomiting, WBC 19,000, CT showing 5 mm right  ureteral calculus with right hydronephrosis    Acute pyelonephritis Right hydronephrosis secondary to obstructing ureteral calculus -IV Rocephin, flomax -IV hydration -Urology consult -N.p.o. for possible urology procedure in the a.m. -Follow cultures -strain urine    Hyperglycemia due to type 2 diabetes mellitus (HCC) -Regular insulin sliding scale coverage    AKI (acute kidney injury) (HCC) -IV hydration and monitor renal function    Obesity, Class III, BMI 40-49.9 (morbid obesity) (HCC) -Potentially complicates overall  prognosis and care    DVT prophylaxis: SCDs given possible procedure in the a.m. Code Status: full code  Family Communication:  none  Disposition Plan: Back to previous home environment Consults called: urology Status: Observation    Andris Baumann MD [facility]     12/09/2019, 2:49 AM

## 2019-12-09 NOTE — H&P (View-Only) (Signed)
Urology Consult  Requesting physician: Dr. Para March  Reason for consultation: Obstructing right ureteral calculus and acute pyelonephritis  Chief Complaint: Kidney stone  History of Present Illness: [name] is a 44 y.o. female seen at request of Dr. Para March for a right ureteral calculus.     Presented to the ED late last night with acute onset of right lower quadrant abdominal pain radiating to right flank.    Pain was described as severe and associated with nausea, vomiting.    No identifiable precipitating, aggravating or alleviating factors.    Denied fever or chills.    Temp in the ED was 99.2.   CT abdomen pelvis with contrast was performed which was remarkable for a 5 mm calculus in the lower right proximal ureter with mild hydronephrosis/proximal hydroureter.  Significant perinephric and proximal periureteral stranding.  Bilateral nonobstructing renal calculi  Denies previous history of stone disease or prior urologic problems  WBC was 19,000  UA with 11-20 WBC/0-5 RBC  Past history significant for diabetes  Since hospital admission pain is better.  Has remained afebrile.  Past Medical History:  Diagnosis Date  . Diabetes mellitus without complication (HCC)   . GERD (gastroesophageal reflux disease)   . Headache   . History of nephrolithiasis 2015   via CT imaging, including an obstructing stone  . Hyperlipidemia   . Hypertension   . Neuromuscular disorder [facility])     Past Surgical History:  Procedure Laterality Date  . CHOLECYSTECTOMY  1998    Home Medications:  No outpatient medications have been marked as taking for the 12/08/19 encounter [facility] Encounter).    Allergies:  Allergies  Allergen Reactions  . Penicillins Hives  . Topiramate     Other reaction(s): Other (See Comments) Worsened migraines, N/V  . Topiramate Er Nausea And Vomiting    Intensified Migraines   . Valtrex [Valacyclovir] Itching    Family History    Problem Relation Age of Onset  . Kidney failure Father   . Pancreatic cancer Maternal Grandmother     Social History:  reports that she has never smoked. She has never used smokeless tobacco. She reports that she does not drink alcohol and does not use drugs.  ROS: A complete review of systems was performed.  All systems are negative except for pertinent findings as noted.  Physical Exam:  Vital signs in last 24 hours: Temp:  [99.2 F (37.3 C)] 99.2 F (37.3 C) (07/27 2236) Pulse Rate:  [81-89] 89 (07/28 0638) Resp:  [11-20] 11 (07/28 0712) BP: (111-160)/(59-92) 111/82 (07/28 1975) SpO2:  [92 %-99 %] 96 % (07/28 8832) Weight:  [124.7 kg] 124.7 kg (07/27 2238) Constitutional:  Alert, No acute distress HEENT: [place] AT, moist mucus membranes.  Trachea midline, no masses Cardiovascular: Regular rate and rhythm, no clubbing, cyanosis, or edema. Respiratory: Normal respiratory effort, lungs clear bilaterally GI: Abdomen is soft, nontender, nondistended, no abdominal masses Skin: No rashes, bruises or suspicious lesions Lymph: No cervical or inguinal adenopathy Neurologic: Grossly intact, no focal deficits, moving all 4 extremities Psychiatric: Normal mood and affect   Laboratory Data:  Recent Labs    12/08/19 2335  WBC 19.3*  HGB 11.5*  HCT 34.1*   Recent Labs    12/08/19 2335  NA 134*  K 3.7  CL 95*  CO2 25  GLUCOSE 376*  BUN 22*  CREATININE 1.43*  CALCIUM 9.2   No results for input(s): LABPT, INR in the last 72 hours. No results  for input(s): LABURIN in the last 72 hours. Results for orders placed or performed during the hospital encounter of 12/08/19  SARS Coronavirus 2 by RT PCR (hospital order, [facility]) Nasopharyngeal Nasopharyngeal Swab     Status: None   Collection Time: 12/09/19  2:31 AM   Specimen: Nasopharyngeal Swab  Result Value Ref Range Status   SARS Coronavirus 2 NEGATIVE NEGATIVE Final    Comment: (NOTE) SARS-CoV-2  target nucleic acids are NOT DETECTED.  The SARS-CoV-2 RNA is generally detectable in upper and lower respiratory specimens during the acute phase of infection. The lowest concentration of SARS-CoV-2 viral copies this assay can detect is 250 copies / mL. A negative result does not preclude SARS-CoV-2 infection and should not be used as the sole basis for treatment or other patient management decisions.  A negative result may occur with improper specimen collection / handling, submission of specimen other than nasopharyngeal swab, presence of viral mutation(s) within the areas targeted by this assay, and inadequate number of viral copies (<250 copies / mL). A negative result must be combined with clinical observations, patient history, and epidemiological information.  Fact Sheet for Patients:   BoilerBrush.com.cyhttps://www.fda.gov/media/136312/download  Fact Sheet for Healthcare Providers: https://pope.com/https://www.fda.gov/media/136313/download  This test is not yet approved or  cleared by the [place] FDA and has been authorized for detection and/or diagnosis of SARS-CoV-2 by FDA under an Emergency Use Authorization (EUA).  This EUA will remain in effect (meaning this test can be used) for the duration of the COVID-19 declaration under Section 564(b)(1) of the Act, 21 U.S.C. section 360bbb-3(b)(1), unless the authorization is terminated or revoked sooner.  Performed at [facility], [address] [place], [place] [address]      Radiologic Imaging: Images were personally reviewed CT ABDOMEN PELVIS W CONTRAST  Result Date: 12/09/2019 CLINICAL DATA:  Right-sided flank pain EXAM: CT ABDOMEN AND PELVIS WITH CONTRAST TECHNIQUE: Multidetector CT imaging of the abdomen and pelvis was performed using the standard protocol following bolus administration of intravenous contrast. CONTRAST:  100mL OMNIPAQUE IOHEXOL 300 MG/ML  SOLN COMPARISON:  June 06, 2014 FINDINGS: Lower chest: The visualized heart  size within normal limits. No pericardial fluid/thickening. No hiatal hernia. The visualized portions of the lungs are clear. Hepatobiliary: The liver is normal in density without focal abnormality.The main portal vein is patent. The patient is status post cholecystectomy. No biliary ductal dilation. Pancreas: Unremarkable. No pancreatic ductal dilatation or surrounding inflammatory changes. Spleen: Normal in size without focal abnormality. Adrenals/Urinary Tract: Both adrenal glands appear normal. Mild right-sided pelvicaliectasis and ureterectasis is seen at the mid ureter where there is a 5 mm calculus. There is significant right-sided perinephric and proximal periureteral fat stranding changes. There is scattered right-sided renal calculi noted the largest measuring 6 mm within the midpole. There is left-sided renal calculi noted within the lower pole measuring 4 mm. No left-sided hydronephrosis is seen. The bladder is unremarkable. Stomach/Bowel: The stomach, small bowel, and colon are normal in appearance. No inflammatory changes, wall thickening, or obstructive findings.The appendix is normal. Vascular/Lymphatic: There are no enlarged mesenteric, retroperitoneal, or pelvic lymph nodes. No significant vascular findings are present. Reproductive: The uterus and adnexa are unremarkable. Other: No evidence of abdominal wall mass or hernia. Again noted is skin thickening at the umbilicus. Musculoskeletal: No acute or significant osseous findings. IMPRESSION: 1. 5 mm mid right ureteral calculus causing mild right hydronephrosis with significant right perinephric/periureteral stranding. 2. Nonobstructing bilateral renal calculi Electronically Signed  By: Jonna Clark M.D.   On: 12/09/2019 01:12   US BREAST LTD UNI RIGHT INC AXILLA  Result Date: 12/08/2019 CLINICAL DATA:  44 year old female with intermittent sharp pain in lower inner right breast for approximately 1 month. EXAM: DIGITAL DIAGNOSTIC BILATERAL  MAMMOGRAM WITH CAD AND TOMO ULTRASOUND RIGHT BREAST COMPARISON:  Previous exam(s). ACR Breast Density Category a: The breast tissue is almost entirely fatty. FINDINGS: A radiopaque BB was placed at the site of the patient's focal pain in the lower inner right breast. No focal or suspicious mammographic abnormalities is seen deep to the radiopaque BB. No suspicious mammographic findings are identified in either breast. Mammographic images were processed with CAD. Targeted ultrasound is performed, showing normal fibroglandular tissue without focal or suspicious sonographic abnormality. Evaluation of the entire lower inner quadrant was performed. IMPRESSION: 1. No mammographic evidence of malignancy in either breast. 2. No suspicious sonographic findings at the site of the patient's focal right breast pain. RECOMMENDATION: 1. Clinical follow-up recommended for the painful area of concern in the right breast. Any further workup should be based on clinical grounds. Benign causes of breast pain, and possible remedies, were discussed with the patient. Patient was encouraged to follow-up with referring physician if pain became persistent or if a palpable lump/mass developed. 2.  Screening mammogram in one year.(Code:SM-B-01Y) I have discussed the findings and recommendations with the patient. If applicable, a reminder letter will be sent to the patient regarding the next appointment. BI-RADS CATEGORY  1: Negative. Electronically Signed   By: Sande Brothers M.D.   On: 12/08/2019 10:19   MS DIGITAL DIAG TOMO BILAT  Result Date: 12/08/2019 CLINICAL DATA:  44 year old female with intermittent sharp pain in lower inner right breast for approximately 1 month. EXAM: DIGITAL DIAGNOSTIC BILATERAL MAMMOGRAM WITH CAD AND TOMO ULTRASOUND RIGHT BREAST COMPARISON:  Previous exam(s). ACR Breast Density Category a: The breast tissue is almost entirely fatty. FINDINGS: A radiopaque BB was placed at the site of the patient's focal pain  in the lower inner right breast. No focal or suspicious mammographic abnormalities is seen deep to the radiopaque BB. No suspicious mammographic findings are identified in either breast. Mammographic images were processed with CAD. Targeted ultrasound is performed, showing normal fibroglandular tissue without focal or suspicious sonographic abnormality. Evaluation of the entire lower inner quadrant was performed. IMPRESSION: 1. No mammographic evidence of malignancy in either breast. 2. No suspicious sonographic findings at the site of the patient's focal right breast pain. RECOMMENDATION: 1. Clinical follow-up recommended for the painful area of concern in the right breast. Any further workup should be based on clinical grounds. Benign causes of breast pain, and possible remedies, were discussed with the patient. Patient was encouraged to follow-up with referring physician if pain became persistent or if a palpable lump/mass developed. 2.  Screening mammogram in one year.(Code:SM-B-01Y) I have discussed the findings and recommendations with the patient. If applicable, a reminder letter will be sent to the patient regarding the next appointment. BI-RADS CATEGORY  1: Negative. Electronically Signed   By: Sande Brothers M.D.   On: 12/08/2019 10:19    Impression/Assessment:  44 y.o. female with a right lower proximal ureteral calculus.  UA with 11-20 WBCs and leukocytosis of 19,000.  Afebrile and clinically stable  Recommendation:   OR add-on today for cystoscopy with placement right ureteral stent.  The procedure was cussed in detail including potential risks of bleeding, worsening sepsis, ureteral injury as well as anesthetic risks.  Possibility of percutaneous  nephrostomy was discussed in the event of an impacted calculus  Alternative of continued antibiotics and observation was also discussed.  She would like to proceed with stent placement.  12/09/2019, 8:30 AM  Irineo Axon,  MD

## 2019-12-09 NOTE — OR Nursing (Signed)
Patient has been stable all day, she has ate dinner used the restroom multiple times, no complaints of pain and is tolerating everything well.  Hospitalist and Dr. Lonna Cobb want to discharge patient home.

## 2019-12-09 NOTE — Discharge Instructions (Addendum)
You have been seen in the Emergency Department (ED) today for pain caused by kidney stones.  As we have discussed, please drink plenty of fluids.  Please make a follow up appointment with the physician(s) listed elsewhere in this documentation.  You may take pain medication as needed but ONLY as prescribed.  Please also take your prescribed Flomax daily.  We also recommend that you take over-the-counter ibuprofen regularly according to label instructions over the next 5 days.  Take it with meals to minimize stomach discomfort.  Please see your doctor as soon as possible as stones may take 1-3 weeks to pass and you may require additional care or medications.  Do not drink alcohol, drive or participate in any other potentially dangerous activities while taking opiate pain medication as it may make you sleepy. Do not take this medication with any other sedating medications, either prescription or over-the-counter. If you were prescribed Percocet or Vicodin, do not take these with acetaminophen (Tylenol) as it is already contained within these medications.   Take Percocet as needed for severe pain.  This medication is an opiate (or narcotic) pain medication and can be habit forming.  Use it as little as possible to achieve adequate pain control.  Do not use or use it with extreme caution if you have a history of opiate abuse or dependence.  If you are on a pain contract with your primary care doctor or a pain specialist, be sure to let them know you were prescribed this medication today from the [facility] Emergency Department.  This medication is intended for your use only - do not give any to anyone else and keep it in a secure place where nobody else, especially children, have access to it.  It will also cause or worsen constipation, so you may want to consider taking an over-the-counter stool softener while you are taking this medication.  Return to the Emergency Department (ED) or call your doctor  if you have any worsening pain, fever, painful urination, are unable to urinate, or develop other symptoms that concern you.   AMBULATORY SURGERY  DISCHARGE INSTRUCTIONS   1) The drugs that you were given will stay in your system until tomorrow so for the next 24 hours you should not:  A) Drive an automobile B) Make any legal decisions C) Drink any alcoholic beverage   2) You may resume regular meals tomorrow.  Today it is better to start with liquids and gradually work up to solid foods.  You may eat anything you prefer, but it is better to start with liquids, then soup and crackers, and gradually work up to solid foods.   3) Please notify your doctor immediately if you have any unusual bleeding, trouble breathing, redness and pain at the surgery site, drainage, fever, or pain not relieved by medication.    4) Additional Instructions:        Please contact your physician with any problems or Same Day Surgery at [phone], Monday through Friday 6 am to 4 pm, or [place] at [facility] number at [phone].

## 2019-12-09 NOTE — Anesthesia Procedure Notes (Signed)
Procedure Name: Intubation Performed by: Doreen Salvage, CRNA Pre-anesthesia Checklist: Patient identified, Patient being monitored, Timeout performed, Emergency Drugs available and Suction available Patient Re-evaluated:Patient Re-evaluated prior to induction Oxygen Delivery Method: Circle system utilized Preoxygenation: Pre-oxygenation with 100% oxygen Induction Type: IV induction Laryngoscope Size: Mac, 3 and McGraph Grade View: Grade I Tube type: Oral Tube size: 7.0 mm Number of attempts: 1 Airway Equipment and Method: Stylet Placement Confirmation: ETT inserted through vocal cords under direct vision,  positive ETCO2 and breath sounds checked- equal and bilateral Secured at: 21 cm Tube secured with: Tape Dental Injury: Teeth and Oropharynx as per pre-operative assessment

## 2019-12-09 NOTE — Consult Note (Signed)
Urology Consult  Requesting physician: Dr. Para March  Reason for consultation: Obstructing right ureteral calculus and acute pyelonephritis  Chief Complaint: Kidney stone  History of Present Illness: [name] is a 44 y.o. female seen at request of Dr. Para March for a right ureteral calculus.     Presented to the ED late last night with acute onset of right lower quadrant abdominal pain radiating to right flank.    Pain was described as severe and associated with nausea, vomiting.    No identifiable precipitating, aggravating or alleviating factors.    Denied fever or chills.    Temp in the ED was 99.2.   CT abdomen pelvis with contrast was performed which was remarkable for a 5 mm calculus in the lower right proximal ureter with mild hydronephrosis/proximal hydroureter.  Significant perinephric and proximal periureteral stranding.  Bilateral nonobstructing renal calculi  Denies previous history of stone disease or prior urologic problems  WBC was 19,000  UA with 11-20 WBC/0-5 RBC  Past history significant for diabetes  Since hospital admission pain is better.  Has remained afebrile.  Past Medical History:  Diagnosis Date  . Diabetes mellitus without complication (HCC)   . GERD (gastroesophageal reflux disease)   . Headache   . History of nephrolithiasis 2015   via CT imaging, including an obstructing stone  . Hyperlipidemia   . Hypertension   . Neuromuscular disorder [facility])     Past Surgical History:  Procedure Laterality Date  . CHOLECYSTECTOMY  1998    Home Medications:  No outpatient medications have been marked as taking for the 12/08/19 encounter [facility] Encounter).    Allergies:  Allergies  Allergen Reactions  . Penicillins Hives  . Topiramate     Other reaction(s): Other (See Comments) Worsened migraines, N/V  . Topiramate Er Nausea And Vomiting    Intensified Migraines   . Valtrex [Valacyclovir] Itching    Family History    Problem Relation Age of Onset  . Kidney failure Father   . Pancreatic cancer Maternal Grandmother     Social History:  reports that she has never smoked. She has never used smokeless tobacco. She reports that she does not drink alcohol and does not use drugs.  ROS: A complete review of systems was performed.  All systems are negative except for pertinent findings as noted.  Physical Exam:  Vital signs in last 24 hours: Temp:  [99.2 F (37.3 C)] 99.2 F (37.3 C) (07/27 2236) Pulse Rate:  [81-89] 89 (07/28 0638) Resp:  [11-20] 11 (07/28 0712) BP: (111-160)/(59-92) 111/82 (07/28 1975) SpO2:  [92 %-99 %] 96 % (07/28 8832) Weight:  [124.7 kg] 124.7 kg (07/27 2238) Constitutional:  Alert, No acute distress HEENT: [place] AT, moist mucus membranes.  Trachea midline, no masses Cardiovascular: Regular rate and rhythm, no clubbing, cyanosis, or edema. Respiratory: Normal respiratory effort, lungs clear bilaterally GI: Abdomen is soft, nontender, nondistended, no abdominal masses Skin: No rashes, bruises or suspicious lesions Lymph: No cervical or inguinal adenopathy Neurologic: Grossly intact, no focal deficits, moving all 4 extremities Psychiatric: Normal mood and affect   Laboratory Data:  Recent Labs    12/08/19 2335  WBC 19.3*  HGB 11.5*  HCT 34.1*   Recent Labs    12/08/19 2335  NA 134*  K 3.7  CL 95*  CO2 25  GLUCOSE 376*  BUN 22*  CREATININE 1.43*  CALCIUM 9.2   No results for input(s): LABPT, INR in the last 72 hours. No results  for input(s): LABURIN in the last 72 hours. Results for orders placed or performed during the hospital encounter of 12/08/19  SARS Coronavirus 2 by RT PCR (hospital order, performed in [place] hospital lab) Nasopharyngeal Nasopharyngeal Swab     Status: None   Collection Time: 12/09/19  2:31 AM   Specimen: Nasopharyngeal Swab  Result Value Ref Range Status   SARS Coronavirus 2 NEGATIVE NEGATIVE Final    Comment: (NOTE) SARS-CoV-2  target nucleic acids are NOT DETECTED.  The SARS-CoV-2 RNA is generally detectable in upper and lower respiratory specimens during the acute phase of infection. The lowest concentration of SARS-CoV-2 viral copies this assay can detect is 250 copies / mL. A negative result does not preclude SARS-CoV-2 infection and should not be used as the sole basis for treatment or other patient management decisions.  A negative result may occur with improper specimen collection / handling, submission of specimen other than nasopharyngeal swab, presence of viral mutation(s) within the areas targeted by this assay, and inadequate number of viral copies (<250 copies / mL). A negative result must be combined with clinical observations, patient history, and epidemiological information.  Fact Sheet for Patients:   https://www.fda.gov/media/136312/download  Fact Sheet for Healthcare Providers: https://www.fda.gov/media/136313/download  This test is not yet approved or  cleared by the [place] FDA and has been authorized for detection and/or diagnosis of SARS-CoV-2 by FDA under an Emergency Use Authorization (EUA).  This EUA will remain in effect (meaning this test can be used) for the duration of the COVID-19 declaration under Section 564(b)(1) of the Act, 21 U.S.C. section 360bbb-3(b)(1), unless the authorization is terminated or revoked sooner.  Performed at [facility], [address] [place], [place] [address]      Radiologic Imaging: Images were personally reviewed CT ABDOMEN PELVIS W CONTRAST  Result Date: 12/09/2019 CLINICAL DATA:  Right-sided flank pain EXAM: CT ABDOMEN AND PELVIS WITH CONTRAST TECHNIQUE: Multidetector CT imaging of the abdomen and pelvis was performed using the standard protocol following bolus administration of intravenous contrast. CONTRAST:  100mL OMNIPAQUE IOHEXOL 300 MG/ML  SOLN COMPARISON:  June 06, 2014 FINDINGS: Lower chest: The visualized heart  size within normal limits. No pericardial fluid/thickening. No hiatal hernia. The visualized portions of the lungs are clear. Hepatobiliary: The liver is normal in density without focal abnormality.The main portal vein is patent. The patient is status post cholecystectomy. No biliary ductal dilation. Pancreas: Unremarkable. No pancreatic ductal dilatation or surrounding inflammatory changes. Spleen: Normal in size without focal abnormality. Adrenals/Urinary Tract: Both adrenal glands appear normal. Mild right-sided pelvicaliectasis and ureterectasis is seen at the mid ureter where there is a 5 mm calculus. There is significant right-sided perinephric and proximal periureteral fat stranding changes. There is scattered right-sided renal calculi noted the largest measuring 6 mm within the midpole. There is left-sided renal calculi noted within the lower pole measuring 4 mm. No left-sided hydronephrosis is seen. The bladder is unremarkable. Stomach/Bowel: The stomach, small bowel, and colon are normal in appearance. No inflammatory changes, wall thickening, or obstructive findings.The appendix is normal. Vascular/Lymphatic: There are no enlarged mesenteric, retroperitoneal, or pelvic lymph nodes. No significant vascular findings are present. Reproductive: The uterus and adnexa are unremarkable. Other: No evidence of abdominal wall mass or hernia. Again noted is skin thickening at the umbilicus. Musculoskeletal: No acute or significant osseous findings. IMPRESSION: 1. 5 mm mid right ureteral calculus causing mild right hydronephrosis with significant right perinephric/periureteral stranding. 2. Nonobstructing bilateral renal calculi Electronically Signed     By: Jonna Clark M.D.   On: 12/09/2019 01:12   US BREAST LTD UNI RIGHT INC AXILLA  Result Date: 12/08/2019 CLINICAL DATA:  44 year old female with intermittent sharp pain in lower inner right breast for approximately 1 month. EXAM: DIGITAL DIAGNOSTIC BILATERAL  MAMMOGRAM WITH CAD AND TOMO ULTRASOUND RIGHT BREAST COMPARISON:  Previous exam(s). ACR Breast Density Category a: The breast tissue is almost entirely fatty. FINDINGS: A radiopaque BB was placed at the site of the patient's focal pain in the lower inner right breast. No focal or suspicious mammographic abnormalities is seen deep to the radiopaque BB. No suspicious mammographic findings are identified in either breast. Mammographic images were processed with CAD. Targeted ultrasound is performed, showing normal fibroglandular tissue without focal or suspicious sonographic abnormality. Evaluation of the entire lower inner quadrant was performed. IMPRESSION: 1. No mammographic evidence of malignancy in either breast. 2. No suspicious sonographic findings at the site of the patient's focal right breast pain. RECOMMENDATION: 1. Clinical follow-up recommended for the painful area of concern in the right breast. Any further workup should be based on clinical grounds. Benign causes of breast pain, and possible remedies, were discussed with the patient. Patient was encouraged to follow-up with referring physician if pain became persistent or if a palpable lump/mass developed. 2.  Screening mammogram in one year.(Code:SM-B-01Y) I have discussed the findings and recommendations with the patient. If applicable, a reminder letter will be sent to the patient regarding the next appointment. BI-RADS CATEGORY  1: Negative. Electronically Signed   By: Sande Brothers M.D.   On: 12/08/2019 10:19   MS DIGITAL DIAG TOMO BILAT  Result Date: 12/08/2019 CLINICAL DATA:  44 year old female with intermittent sharp pain in lower inner right breast for approximately 1 month. EXAM: DIGITAL DIAGNOSTIC BILATERAL MAMMOGRAM WITH CAD AND TOMO ULTRASOUND RIGHT BREAST COMPARISON:  Previous exam(s). ACR Breast Density Category a: The breast tissue is almost entirely fatty. FINDINGS: A radiopaque BB was placed at the site of the patient's focal pain  in the lower inner right breast. No focal or suspicious mammographic abnormalities is seen deep to the radiopaque BB. No suspicious mammographic findings are identified in either breast. Mammographic images were processed with CAD. Targeted ultrasound is performed, showing normal fibroglandular tissue without focal or suspicious sonographic abnormality. Evaluation of the entire lower inner quadrant was performed. IMPRESSION: 1. No mammographic evidence of malignancy in either breast. 2. No suspicious sonographic findings at the site of the patient's focal right breast pain. RECOMMENDATION: 1. Clinical follow-up recommended for the painful area of concern in the right breast. Any further workup should be based on clinical grounds. Benign causes of breast pain, and possible remedies, were discussed with the patient. Patient was encouraged to follow-up with referring physician if pain became persistent or if a palpable lump/mass developed. 2.  Screening mammogram in one year.(Code:SM-B-01Y) I have discussed the findings and recommendations with the patient. If applicable, a reminder letter will be sent to the patient regarding the next appointment. BI-RADS CATEGORY  1: Negative. Electronically Signed   By: Sande Brothers M.D.   On: 12/08/2019 10:19    Impression/Assessment:  44 y.o. female with a right lower proximal ureteral calculus.  UA with 11-20 WBCs and leukocytosis of 19,000.  Afebrile and clinically stable  Recommendation:   OR add-on today for cystoscopy with placement right ureteral stent.  The procedure was cussed in detail including potential risks of bleeding, worsening sepsis, ureteral injury as well as anesthetic risks.  Possibility of percutaneous  nephrostomy was discussed in the event of an impacted calculus  Alternative of continued antibiotics and observation was also discussed.  She would like to proceed with stent placement.  12/09/2019, 8:30 AM  Irineo Axon,  MD

## 2019-12-10 ENCOUNTER — Other Ambulatory Visit: Payer: Self-pay

## 2019-12-10 ENCOUNTER — Encounter: Payer: Self-pay | Admitting: Urology

## 2019-12-10 DIAGNOSIS — N200 Calculus of kidney: Secondary | ICD-10-CM

## 2019-12-10 LAB — URINE CULTURE: Culture: NO GROWTH

## 2019-12-10 NOTE — Progress Notes (Signed)
bmp 

## 2019-12-11 ENCOUNTER — Other Ambulatory Visit: Payer: Self-pay

## 2019-12-11 ENCOUNTER — Telehealth: Payer: Self-pay | Admitting: Pharmacist

## 2019-12-11 DIAGNOSIS — N200 Calculus of kidney: Secondary | ICD-10-CM

## 2019-12-11 LAB — URINE CULTURE
Culture: >=100000 — AB
Special Requests: NORMAL

## 2019-12-11 NOTE — Anesthesia Postprocedure Evaluation (Signed)
Anesthesia Post Note  Patient: Anaiah Mcmannis  Procedure(s) Performed: CYSTOSCOPY WITH STENT PLACEMENT (Right )  Patient location during evaluation: PACU Anesthesia Type: General Level of consciousness: awake and alert Pain management: pain level controlled Vital Signs Assessment: post-procedure vital signs reviewed and stable Respiratory status: spontaneous breathing, nonlabored ventilation and respiratory function stable Cardiovascular status: blood pressure returned to baseline and stable Postop Assessment: no apparent nausea or vomiting Anesthetic complications: no   No complications documented.   Last Vitals:  Vitals:   12/09/19 1750 12/09/19 1943  BP: (!) 131/67 (!) 147/76  Pulse: 75   Resp: 21   Temp: 36.7 C   SpO2: 99%     Last Pain:  Vitals:   12/09/19 1750  TempSrc: Temporal  PainSc: 0-No pain                 Aurelio Brash Kaid Seeberger

## 2019-12-11 NOTE — Telephone Encounter (Signed)
12/11/2019 2:56:50 PM - Victoza & tips refill to [facility]  -- Rhetta Mura - Friday, December 11, 2019 2:55 PM --[facility] refill request for [employer] 1.8mg  every morning #4 boxes & Novofine 32G tips #2 boxes.

## 2019-12-12 LAB — CBC WITH DIFFERENTIAL
Basophils Absolute: 0.1 10*3/uL (ref 0.0–0.2)
Basos: 1 %
EOS (ABSOLUTE): 0.2 10*3/uL (ref 0.0–0.4)
Eos: 2 %
Hematocrit: 39.1 % (ref 34.0–46.6)
Hemoglobin: 11.1 g/dL (ref 11.1–15.9)
Immature Grans (Abs): 0 10*3/uL (ref 0.0–0.1)
Immature Granulocytes: 0 %
Lymphocytes Absolute: 1.3 10*3/uL (ref 0.7–3.1)
Lymphs: 13 %
MCH: 21.7 pg — ABNORMAL LOW (ref 26.6–33.0)
MCHC: 28.4 g/dL — ABNORMAL LOW (ref 31.5–35.7)
MCV: 77 fL — ABNORMAL LOW (ref 79–97)
Monocytes Absolute: 0.3 10*3/uL (ref 0.1–0.9)
Monocytes: 3 %
Neutrophils Absolute: 8.2 10*3/uL — ABNORMAL HIGH (ref 1.4–7.0)
Neutrophils: 81 %
RBC: 5.11 x10E6/uL (ref 3.77–5.28)
RDW: 15.5 % — ABNORMAL HIGH (ref 11.7–15.4)
WBC: 10 10*3/uL (ref 3.4–10.8)

## 2019-12-12 LAB — BASIC METABOLIC PANEL
BUN/Creatinine Ratio: 14 (ref 9–23)
BUN: 14 mg/dL (ref 6–24)
CO2: 27 mmol/L (ref 20–29)
Calcium: 10 mg/dL (ref 8.7–10.2)
Chloride: 94 mmol/L — ABNORMAL LOW (ref 96–106)
Creatinine, Ser: 0.99 mg/dL (ref 0.57–1.00)
GFR calc Af Amer: 79 mL/min/{1.73_m2} (ref >59)
GFR calc non Af Amer: 69 mL/min/{1.73_m2} (ref >59)
Glucose: 374 mg/dL — ABNORMAL HIGH (ref 65–99)
Potassium: 5.1 mmol/L (ref 3.5–5.2)
Sodium: 137 mmol/L (ref 134–144)

## 2019-12-13 DIAGNOSIS — I10 Essential (primary) hypertension: Secondary | ICD-10-CM

## 2019-12-13 DIAGNOSIS — M545 Low back pain, unspecified: Secondary | ICD-10-CM

## 2019-12-13 HISTORY — DX: Low back pain: M54.5

## 2019-12-13 HISTORY — DX: Essential (primary) hypertension: I10

## 2019-12-13 HISTORY — DX: Low back pain, unspecified: M54.50

## 2019-12-15 ENCOUNTER — Ambulatory Visit: Payer: Self-pay | Admitting: Gerontology

## 2019-12-21 ENCOUNTER — Other Ambulatory Visit: Payer: Self-pay

## 2019-12-21 ENCOUNTER — Telehealth: Payer: Self-pay | Admitting: Radiology

## 2019-12-21 ENCOUNTER — Other Ambulatory Visit: Admission: RE | Admit: 2019-12-21 | Payer: Self-pay | Source: Home / Self Care | Admitting: Urology

## 2019-12-21 ENCOUNTER — Other Ambulatory Visit: Payer: Self-pay | Admitting: Radiology

## 2019-12-21 DIAGNOSIS — N200 Calculus of kidney: Secondary | ICD-10-CM

## 2019-12-21 NOTE — Telephone Encounter (Signed)
LMOM to return call. Need to discuss surgery for kidney stone with Dr Lonna Cobb.

## 2019-12-22 LAB — URINALYSIS, COMPLETE
Bilirubin, UA: NEGATIVE
Ketones, UA: NEGATIVE
Nitrite, UA: NEGATIVE
Specific Gravity, UA: 1.025 (ref 1.005–1.030)
Urobilinogen, Ur: 0.2 mg/dL (ref 0.2–1.0)
pH, UA: 5 (ref 5.0–7.5)

## 2019-12-22 LAB — MICROSCOPIC EXAMINATION: RBC, Urine: >30 /hpf — AB (ref 0–2)

## 2019-12-24 ENCOUNTER — Other Ambulatory Visit: Payer: Self-pay

## 2019-12-24 ENCOUNTER — Other Ambulatory Visit
Admission: RE | Admit: 2019-12-24 | Discharge: 2019-12-24 | Disposition: A | Discharge status: Home / Self Care | Payer: Self-pay | Source: Ambulatory Visit | Attending: Urology | Admitting: Urology

## 2019-12-24 HISTORY — DX: Depression, unspecified: F32.A

## 2019-12-24 HISTORY — DX: Personal history of urinary calculi: Z87.442

## 2019-12-24 LAB — CULTURE, URINE COMPREHENSIVE

## 2019-12-24 NOTE — Patient Instructions (Addendum)
INSTRUCTIONS FOR SURGERY     Your surgery is scheduled for:   Tuesday, AUGUST 17TH     To find out your arrival time for the day of surgery,          please call [phone] between 1 pm and 3 pm on :  Monday, AUGUST 16TH     When you arrive for surgery, report to the SECOND FLOOR OF THE MEDICAL MALL.       Do NOT stop on the first floor to register.    REMEMBER: Instructions that are not followed completely may result in serious medical risk,  p to and including death, or upon the discretion of your surgeon and anesthesiologist,            your surgery may need to be rescheduled.  __X__ 1. Do not eat food after midnight the night before your procedure.                    No gum, candy, lozenger, tic tacs, tums or hard candies.                  ABSOLUTELY NOTHING SOLID IN YOUR MOUTH AFTER MIDNIGHT                    You may drink unlimited clear liquids up to 2 hours before you are scheduled to arrive for surgery.                   Do not drink anything within those 2 hours unless you need to take medicine, then take the                   smallest amount you need.  Clear liquids include:  water, apple juice without pulp,                   any flavor Gatorade, Black coffee, black tea.  Sugar may be added but no dairy/ honey /lemon.                        Broth and jello is not considered a clear liquid.  __x__  2. On the morning of surgery, please brush your teeth with toothpaste and water. You may rinse with                  mouthwash if you wish but DO NOT SWALLOW TOOTHPASTE OR MOUTHWASH  __X___3. NO alcohol for 24 hours before or after surgery.  __x___ 4.  Do NOT smoke or use e-cigarettes for 24 HOURS PRIOR TO SURGERY.                      DO NOT Use any chewable tobacco products for at least 6 hours prior to surgery.  __x___ 5. If you start any new medication after this appointment and prior to surgery, please                    Bring it with you on the day of surgery.  ___x__ 6. Notify your doctor if there is any change in  your medical condition, such as fever,                  infection, vomitting,diarrhea or any open sores.  __x___ 7.  USE ANTIBACTERIAL SOAP as instructed, the night before surgery and the day of surgery.                   Once you have washed with this soap, do NOT use any of the following: Powders, perfumes                    or lotions. Please do not wear make up, hairpins, clips or nail polish. You MAY  wear deodorant.                   Men may shave their face and neck.  Women need to shave 48 hours prior to surgery.                   DO NOT wear ANY jewelry on the day of surgery. If there are rings that are too tight to                    remove easily, please address this prior to the surgery day. Piercings need to be removed.                                                                     NO METAL ON YOUR BODY.                    Do NOT bring any valuables.  If you came to Pre-Admit testing then you will not need license,                     insurance card or credit card.  If you will be staying overnight, please either leave your things in                     the car or have your family be responsible for these items.                     [facility] IS NOT RESPONSIBLE FOR BELONGINGS OR VALUABLES.  ___X__ 8. DO NOT wear contact lenses on surgery day.  You may not have dentures,                     Hearing aides, contacts or glasses in the operating room. These items can be                    Placed in the Recovery Room to receive immediately after surgery.  __x___ 9. IF YOU ARE SCHEDULED TO GO HOME ON THE SAME DAY, YOU MUST                   Have someone to drive you home and to stay with you  for the first 24 hours.                    Have an arrangement prior to arriving on surgery day.  ___x__ 10. Take the following medications on the morning  of surgery with a sip of water:                               1. NOTHING                     2.  _____ 11.  Follow any instructions provided to you by your surgeon.                        Such as enema, clear liquid bowel prep  __X__  12. STOP ALL ASPIRIN PRODUCTS AS OF TODAY, 12/24/19.                       THIS INCLUDES BC POWDERS / GOODIES POWDER  __x___ 13. STOP Anti-inflammatories as of: TODAY, 12/24/19                      This includes IBUPROFEN / MOTRIN / ADVIL / ALEVE/ NAPROXYN                    YOU MAY TAKE TYLENOL ANY TIME PRIOR TO SURGERY.   __X____16.  TAKE 1/2 OF USUAL INSULIN DOSE ON THE EVENING PRIOR TO SURGERY.                     YOU MAY TAKE THE FULL DOSE OF VICTOZA THE DAY PRIOR TO SURGERY.                     Do NOT take any diabetes medications on surgery day.  ______18.  Wear clean and comfortable clothing to the hospital.

## 2019-12-25 ENCOUNTER — Other Ambulatory Visit
Admission: RE | Admit: 2019-12-25 | Discharge: 2019-12-25 | Disposition: A | Discharge status: Home / Self Care | Payer: Self-pay | Source: Ambulatory Visit | Attending: Urology | Admitting: Urology

## 2019-12-25 DIAGNOSIS — Z20822 Contact with and (suspected) exposure to covid-19: Secondary | ICD-10-CM | POA: Insufficient documentation

## 2019-12-25 DIAGNOSIS — Z01812 Encounter for preprocedural laboratory examination: Secondary | ICD-10-CM | POA: Insufficient documentation

## 2019-12-25 LAB — BASIC METABOLIC PANEL
Anion gap: 8 (ref 5–15)
BUN: 12 mg/dL (ref 6–20)
CO2: 27 mmol/L (ref 22–32)
Calcium: 9.2 mg/dL (ref 8.9–10.3)
Chloride: 100 mmol/L (ref 98–111)
Creatinine, Ser: 0.65 mg/dL (ref 0.44–1.00)
GFR calc Af Amer: >60 mL/min (ref >60)
GFR calc non Af Amer: >60 mL/min (ref >60)
Glucose, Bld: 247 mg/dL — ABNORMAL HIGH (ref 70–99)
Potassium: 3.6 mmol/L (ref 3.5–5.1)
Sodium: 135 mmol/L (ref 135–145)

## 2019-12-26 LAB — SARS CORONAVIRUS 2 (TAT 6-24 HRS): SARS Coronavirus 2: NEGATIVE

## 2019-12-29 ENCOUNTER — Encounter
Admission: RE | Disposition: A | Discharge status: Home / Self Care | Payer: Self-pay | Source: Home / Self Care | Attending: Urology

## 2019-12-29 ENCOUNTER — Encounter: Payer: Self-pay | Admitting: Urology

## 2019-12-29 ENCOUNTER — Ambulatory Visit
Admission: RE | Admit: 2019-12-29 | Discharge: 2019-12-29 | Disposition: A | Discharge status: Home / Self Care | Payer: Self-pay | Attending: Urology | Admitting: Urology

## 2019-12-29 ENCOUNTER — Ambulatory Visit: Payer: Self-pay

## 2019-12-29 ENCOUNTER — Ambulatory Visit: Payer: Self-pay | Admitting: Anesthesiology

## 2019-12-29 DIAGNOSIS — E1140 Type 2 diabetes mellitus with diabetic neuropathy, unspecified: Secondary | ICD-10-CM | POA: Insufficient documentation

## 2019-12-29 DIAGNOSIS — Z466 Encounter for fitting and adjustment of urinary device: Secondary | ICD-10-CM | POA: Insufficient documentation

## 2019-12-29 DIAGNOSIS — Z888 Allergy status to other drugs, medicaments and biological substances status: Secondary | ICD-10-CM | POA: Insufficient documentation

## 2019-12-29 DIAGNOSIS — Z87442 Personal history of urinary calculi: Secondary | ICD-10-CM | POA: Insufficient documentation

## 2019-12-29 DIAGNOSIS — N202 Calculus of kidney with calculus of ureter: Secondary | ICD-10-CM

## 2019-12-29 DIAGNOSIS — N200 Calculus of kidney: Secondary | ICD-10-CM

## 2019-12-29 DIAGNOSIS — Z880 Allergy status to penicillin: Secondary | ICD-10-CM | POA: Insufficient documentation

## 2019-12-29 DIAGNOSIS — I10 Essential (primary) hypertension: Secondary | ICD-10-CM | POA: Insufficient documentation

## 2019-12-29 HISTORY — PX: CYSTOSCOPY/RETROGRADE/URETEROSCOPY: SHX5316

## 2019-12-29 LAB — GLUCOSE, CAPILLARY
Glucose-Capillary: 254 mg/dL — ABNORMAL HIGH (ref 70–99)
Glucose-Capillary: 204 mg/dL — ABNORMAL HIGH (ref 70–99)

## 2019-12-29 LAB — POCT PREGNANCY, URINE: Preg Test, Ur: NEGATIVE

## 2019-12-29 SURGERY — CYSTOSCOPY/RETROGRADE/URETEROSCOPY
Anesthesia: General | Laterality: Right

## 2019-12-29 MED ORDER — SODIUM CHLORIDE 0.9 % IV SOLN
1.0000 g | INTRAVENOUS | Status: AC
  Administered 2019-12-29: 1 g via INTRAVENOUS
  Filled 2019-12-29: qty 10

## 2019-12-29 MED ORDER — MIDAZOLAM HCL 2 MG/2ML IJ SOLN
INTRAMUSCULAR | Status: DC | PRN
  Administered 2019-12-29: 2 mg via INTRAVENOUS

## 2019-12-29 MED ORDER — ONDANSETRON HCL 4 MG/2ML IJ SOLN
INTRAMUSCULAR | Status: DC | PRN
  Administered 2019-12-29: 4 mg via INTRAVENOUS

## 2019-12-29 MED ORDER — FENTANYL CITRATE (PF) 100 MCG/2ML IJ SOLN: 25.0000 ug | INTRAMUSCULAR | Status: DC | PRN

## 2019-12-29 MED ORDER — FENTANYL CITRATE (PF) 100 MCG/2ML IJ SOLN
INTRAMUSCULAR | Status: AC
  Filled 2019-12-29: qty 2

## 2019-12-29 MED ORDER — OXYCODONE HCL 5 MG/5ML PO SOLN: 5.0000 mg | Freq: Once | ORAL | Status: DC | PRN

## 2019-12-29 MED ORDER — LIDOCAINE HCL (CARDIAC) PF 100 MG/5ML IV SOSY
PREFILLED_SYRINGE | INTRAVENOUS | Status: DC | PRN
  Administered 2019-12-29: 100 mg via INTRAVENOUS

## 2019-12-29 MED ORDER — LIDOCAINE HCL (PF) 2 % IJ SOLN
INTRAMUSCULAR | Status: AC
  Filled 2019-12-29: qty 5

## 2019-12-29 MED ORDER — CHLORHEXIDINE GLUCONATE 0.12 % MT SOLN
OROMUCOSAL | Status: AC
  Filled 2019-12-29: qty 15

## 2019-12-29 MED ORDER — OXYCODONE HCL 5 MG PO TABS: 5.0000 mg | ORAL_TABLET | Freq: Once | ORAL | Status: DC | PRN

## 2019-12-29 MED ORDER — FENTANYL CITRATE (PF) 100 MCG/2ML IJ SOLN
INTRAMUSCULAR | Status: DC | PRN
  Administered 2019-12-29 (×2): 50 ug via INTRAVENOUS

## 2019-12-29 MED ORDER — PROPOFOL 10 MG/ML IV BOLUS
INTRAVENOUS | Status: AC
  Filled 2019-12-29: qty 20

## 2019-12-29 MED ORDER — PHENYLEPHRINE HCL (PRESSORS) 10 MG/ML IV SOLN
INTRAVENOUS | Status: DC | PRN
  Administered 2019-12-29 (×2): 100 ug via INTRAVENOUS

## 2019-12-29 MED ORDER — MIDAZOLAM HCL 2 MG/2ML IJ SOLN
INTRAMUSCULAR | Status: AC
  Filled 2019-12-29: qty 2

## 2019-12-29 MED ORDER — CHLORHEXIDINE GLUCONATE 0.12 % MT SOLN
15.0000 mL | Freq: Once | OROMUCOSAL | Status: AC
  Administered 2019-12-29: 15 mL via OROMUCOSAL

## 2019-12-29 MED ORDER — SODIUM CHLORIDE 0.9 % IV SOLN: INTRAVENOUS | Status: DC

## 2019-12-29 MED ORDER — PROPOFOL 10 MG/ML IV BOLUS
INTRAVENOUS | Status: DC | PRN
  Administered 2019-12-29: 200 mg via INTRAVENOUS

## 2019-12-29 MED ORDER — ORAL CARE MOUTH RINSE: 15.0000 mL | Freq: Once | OROMUCOSAL | Status: AC

## 2019-12-29 SURGICAL SUPPLY — 33 items
BAG DRAIN CYSTO-URO LG1000N (MISCELLANEOUS) ×3 IMPLANT
BASKET ZERO TIP 1.9FR (BASKET) IMPLANT
BRUSH SCRUB EZ 1% IODOPHOR (MISCELLANEOUS) ×3 IMPLANT
BSKT STON RTRVL ZERO TP 1.9FR (BASKET)
CATH URETL 5X70 OPEN END (CATHETERS) IMPLANT
CNTNR SPEC 2.5X3XGRAD LEK (MISCELLANEOUS)
CONT SPEC 4OZ STER OR WHT (MISCELLANEOUS)
CONT SPEC 4OZ STRL OR WHT (MISCELLANEOUS)
CONTAINER SPEC 2.5X3XGRAD LEK (MISCELLANEOUS) IMPLANT
DRAPE UTILITY 15X26 TOWEL STRL (DRAPES) ×3 IMPLANT
FIBER LASER TRACTIP 200 (UROLOGICAL SUPPLIES) ×3 IMPLANT
FORCEPS BIOP PIRANHA Y (CUTTING FORCEPS) IMPLANT
GLOVE BIOGEL PI IND STRL 7.5 (GLOVE) ×2 IMPLANT
GLOVE BIOGEL PI INDICATOR 7.5 (GLOVE) ×1
GOWN STRL REUS W/ TWL LRG LVL3 (GOWN DISPOSABLE) ×2 IMPLANT
GOWN STRL REUS W/ TWL XL LVL3 (GOWN DISPOSABLE) ×2 IMPLANT
GOWN STRL REUS W/TWL LRG LVL3 (GOWN DISPOSABLE) ×3
GOWN STRL REUS W/TWL XL LVL3 (GOWN DISPOSABLE) ×3
GUIDEWIRE STR DUAL SENSOR (WIRE) ×3 IMPLANT
INFUSOR MANOMETER BAG 3000ML (MISCELLANEOUS) ×3 IMPLANT
INTRODUCER DILATOR DOUBLE (INTRODUCER) IMPLANT
KIT TURNOVER CYSTO (KITS) ×3 IMPLANT
PACK CYSTO AR (MISCELLANEOUS) ×3 IMPLANT
SET CYSTO W/LG BORE CLAMP LF (SET/KITS/TRAYS/PACK) ×3 IMPLANT
SHEATH URETERAL 12FRX35CM (MISCELLANEOUS) IMPLANT
SOL .9 NS 3000ML IRR  AL (IV SOLUTION) ×1
SOL .9 NS 3000ML IRR AL (IV SOLUTION) ×2
SOL .9 NS 3000ML IRR UROMATIC (IV SOLUTION) ×2 IMPLANT
STENT URET 6FRX24 CONTOUR (STENTS) IMPLANT
STENT URET 6FRX26 CONTOUR (STENTS) IMPLANT
SURGILUBE 2OZ TUBE FLIPTOP (MISCELLANEOUS) ×3 IMPLANT
VALVE UROSEAL ADJ ENDO (VALVE) ×3 IMPLANT
WATER STERILE IRR 1000ML POUR (IV SOLUTION) ×3 IMPLANT

## 2019-12-29 NOTE — Anesthesia Preprocedure Evaluation (Addendum)
Anesthesia Evaluation  Patient identified by MRN, date of birth, ID band Patient awake    Reviewed: Allergy & Precautions, H&P , NPO status , Patient's Chart, lab work & pertinent test results  History of Anesthesia Complications Negative for: history of anesthetic complications  Airway Mallampati: III  TM Distance: >3 FB Neck ROM: full    Dental  (+) Chipped, Poor Dentition, Missing   Pulmonary neg pulmonary ROS, neg shortness of breath,    Pulmonary exam normal        Cardiovascular Exercise Tolerance: Good hypertension, (-) angina(-) Past MI and (-) DOE Normal cardiovascular exam     Neuro/Psych  Headaches, PSYCHIATRIC DISORDERS  Neuromuscular disease negative psych ROS   GI/Hepatic Neg liver ROS, GERD  Medicated and Controlled,  Endo/Other  diabetes, Type 2, Insulin Dependent  Renal/GU Renal disease     Musculoskeletal   Abdominal   Peds  Hematology negative hematology ROS (+)   Anesthesia Other Findings Past Medical History: No date: Depression No date: Diabetes mellitus without complication (HCC) No date: GERD (gastroesophageal reflux disease) No date: Headache No date: History of kidney stones 2015: History of nephrolithiasis     Comment:  via CT imaging, including an obstructing stone No date: Hyperlipidemia 12/2019: Hypertension     Comment:  per patient, she has never been treated for high blood               pressure 12/2019: Low back pain No date: Neuromuscular disorder (HCC)     Comment:  neuropathy  Past Surgical History: 1998: CHOLECYSTECTOMY 12/09/2019: CYSTOSCOPY WITH STENT PLACEMENT; Right     Comment:  Procedure: CYSTOSCOPY WITH STENT PLACEMENT;  Surgeon:               Riki Altes, MD;  Location: [facility] ORS;  Service:               Urology;  Laterality: Right;     Reproductive/Obstetrics negative OB ROS                            Anesthesia  Physical Anesthesia Plan  ASA: III  Anesthesia Plan: General LMA   Post-op Pain Management:    Induction: Intravenous  PONV Risk Score and Plan: Dexamethasone, Ondansetron, Midazolam and Treatment may vary due to age or medical condition  Airway Management Planned: LMA  Additional Equipment:   Intra-op Plan:   Post-operative Plan: Extubation in OR  Informed Consent: I have reviewed the patients History and Physical, chart, labs and discussed the procedure including the risks, benefits and alternatives for the proposed anesthesia with the patient or authorized representative who has indicated his/her understanding and acceptance.     Dental Advisory Given  Plan Discussed with: Anesthesiologist, CRNA and Surgeon  Anesthesia Plan Comments: (Patient consented for risks of anesthesia including but not limited to:  - adverse reactions to medications - damage to eyes, teeth, lips or other oral mucosa - nerve damage due to positioning  - sore throat or hoarseness - Damage to heart, brain, nerves, lungs, other parts of body or loss of life  Patient voiced understanding.)        Anesthesia Quick Evaluation

## 2019-12-29 NOTE — Anesthesia Procedure Notes (Signed)
Procedure Name: LMA Insertion Performed by: Dejah Droessler Ben, CRNA Pre-anesthesia Checklist: Patient identified, Emergency Drugs available, Suction available and Patient being monitored Patient Re-evaluated:Patient Re-evaluated prior to induction Oxygen Delivery Method: Circle system utilized Preoxygenation: Pre-oxygenation with 100% oxygen Induction Type: IV induction Ventilation: Mask ventilation without difficulty LMA: LMA inserted LMA Size: 4.0 Tube type: Oral Number of attempts: 1 Airway Equipment and Method: Oral airway Placement Confirmation: positive ETCO2 and breath sounds checked- equal and bilateral Tube secured with: Tape Dental Injury: Teeth and Oropharynx as per pre-operative assessment        

## 2019-12-29 NOTE — Op Note (Signed)
Preoperative diagnosis:  Right proximal ureteral calculus Right nephrolithiasis  Postoperative diagnosis:  No urinary tract stones identified  Procedure:  Cystoscopy with stent removal Diagnostic ureteroscopy Right retrograde pyelography with interpretation  Surgeon: Lorin Picket C. Tyion Boylen, M.D.  Anesthesia: General  Complications: None  Intraoperative findings:  1.  Right retrograde pyelography post procedure showed no filling defects, calculi or contrast extravasation  EBL: Minimal  Specimens: None   Indication: [name] is a 44 y.o. female status post right ureteral stent placement late July 2021 for a right proximal ureteral calculus with pyelonephritis.  CT also showed right nonobstructing renal calculi.  She presents today for definitive stone treatment today after reviewing the management options for treatment, the patient elected to proceed with the above surgical procedure(s). We have discussed the potential benefits and risks of the procedure, side effects of the proposed treatment, the likelihood of the patient achieving the goals of the procedure, and any potential problems that might occur during the procedure or recuperation. Informed consent has been obtained.  Description of procedure:  The patient was taken to the operating room and general anesthesia was induced.  The patient was placed in the dorsal lithotomy position, prepped and draped in the usual sterile fashion, and preoperative antibiotics were administered. A preoperative time-out was performed.   A 21 French cystoscope was lubricated and passed under direct vision. Panendoscopy was performed and the bladder mucosa showed no erythema, solid or papillary lesions.  The stent was not identified in the bladder and fluoroscopy showed proximal migration.  Attention was directed to the right ureteral orifice and a 0.038 Sensor wire was then advanced up the ureter into the renal pelvis under fluoroscopic  guidance.   A 4.5 French semirigid ureteroscope was passed per urethra and the right ureteral orifice was easily engaged.  The stent was identified and grasped with the Piranha forceps and removed without difficulty.  The ureteroscope was repassed.  The right ureter showed excellent passive dilation with the ureteral stent.  No stone was identified with scope passage up to the proximal ureter.  The semirigid ureteroscope was removed  A single channel digital ureteroscope was then passed per urethra.  The scope was easily advanced into the ureteral orifice alongside the guidewire and advanced proximally.  No calculi were identified within the ureter.  Retrograde pyelogram was performed with findings as described above.  All calyces were examined and no calculi were seen with exception of Randall's plaque in a midpole calyx.  The ureteroscope was slowly withdrawn and it was verified the ureter was clear of urinary calculi.  The ureteral stent was not replaced.  The patient appeared to tolerate the procedure well and without complications.  After anesthetic reversal the patient was transported to the PACU in stable condition.   Plan: Follow-up office visit 1 month   Irineo Axon, MD

## 2019-12-29 NOTE — Transfer of Care (Signed)
Immediate Anesthesia Transfer of Care Note  Patient: [name]  Procedure(s) Performed: CYSTOSCOPY/URETEROSCOPY/HOLMIUM LASER/STENT PLACEMENT (Right )  Patient Location: PACU  Anesthesia Type:General  Level of Consciousness: awake and alert   Airway & Oxygen Therapy: Patient Spontanous Breathing and Patient connected to nasal cannula oxygen  Post-op Assessment: Report given to RN and Post -op Vital signs reviewed and stable  Post vital signs: Reviewed and stable  Last Vitals:  Vitals Value Taken Time  BP 132/80 12/29/19 1308  Temp 36.1 C 12/29/19 1308  Pulse 75 12/29/19 1311  Resp 0 12/29/19 1311  SpO2 100 % 12/29/19 1311  Vitals shown include unvalidated device data.  Last Pain:  Vitals:   12/29/19 1308  PainSc: Asleep         Complications: No complications documented.

## 2019-12-29 NOTE — Interval H&P Note (Signed)
History and Physical Interval Note:  Status post placement right ureteral stent 12/09/2019 for an obstructing right proximal ureteral calculus with pyelonephritis.  Urine culture grew Klebsiella.  Preoperative urine culture had no significant growth.  She presents for definitive stone management with ureteroscopy, laser lithotripsy and stone removal.  The procedure was discussed in detail occluding potential risks of bleeding, infection, ureteral injury.  The potential need for replacement of her ureteral stent temporarily was also discussed.  She indicated all questions were answered and desires to proceed  12/29/2019 12:05 PM  Elona Anitha Kreiser  has presented today for surgery, with the diagnosis of right ureteral calculus, right nephrolithiasis.  The various methods of treatment have been discussed with the patient and family. After consideration of risks, benefits and other options for treatment, the patient has consented to  Procedure(s): CYSTOSCOPY/URETEROSCOPY/HOLMIUM LASER/STENT PLACEMENT (Right) as a surgical intervention.  The patient's history has been reviewed, patient examined, no change in status, stable for surgery.  I have reviewed the patient's chart and labs.  Questions were answered to the patient's satisfaction.     Sava Proby C Maurilio Puryear

## 2019-12-29 NOTE — Discharge Instructions (Signed)
AMBULATORY SURGERY  DISCHARGE INSTRUCTIONS   1) The drugs that you were given will stay in your system until tomorrow so for the next 24 hours you should not:  A) Drive an automobile B) Make any legal decisions C) Drink any alcoholic beverage   2) You may resume regular meals tomorrow.  Today it is better to start with liquids and gradually work up to solid foods.  You may eat anything you prefer, but it is better to start with liquids, then soup and crackers, and gradually work up to solid foods.   3) Please notify your doctor immediately if you have any unusual bleeding, trouble breathing, redness and pain at the surgery site, drainage, fever, or pain not relieved by medication.    4) Additional Instructions:        Please contact your physician with any problems or Same Day Surgery at [phone], Monday through Friday 6 am to 4 pm, or [place] at [facility] number at [phone].DISCHARGE INSTRUCTIONS FOR KIDNEY STONE/URETERAL STENT   MEDICATIONS:  1. Resume all your other meds from home.  2.  AZO (over-the-counter) can help with the burning/stinging when you urinate.  ACTIVITY:  1. May resume regular activities in 24 hours. 2. No driving while on narcotic pain medications  3. Drink plenty of water  4. Continue to walk at home - you can still get blood clots when you are at home, so keep active, but don't over do it.  5. May return to work/school tomorrow or when you feel ready   SIGNS/SYMPTOMS TO CALL:  Please call us if you have a fever greater than 101.5, uncontrolled nausea/vomiting, uncontrolled pain, dizziness, unable to urinate, bloody urine, chest pain, shortness of breath, leg swelling, leg pain, or any other concerns or questions.   You can reach [place] at [phone].   FOLLOW-UP:  1.  You will be contacted regarding follow-up appointment in approximately 1 month

## 2019-12-30 ENCOUNTER — Encounter: Payer: Self-pay | Admitting: Urology

## 2019-12-30 ENCOUNTER — Telehealth: Payer: Self-pay | Admitting: Urology

## 2019-12-30 NOTE — Telephone Encounter (Signed)
-----   Message from Riki Altes, MD sent at 12/29/2019  5:42 PM EDT ----- Regarding: Follow-up Please schedule office follow-up 1 month

## 2019-12-30 NOTE — Telephone Encounter (Signed)
APP MADE PATIENT IS AWARE  [name] 

## 2019-12-31 NOTE — Anesthesia Postprocedure Evaluation (Signed)
Anesthesia Post Note  Patient: [name]  Procedure(s) Performed: CYSTOSCOPY/RETROGRADE/URETEROSCOPY  Patient location during evaluation: PACU Anesthesia Type: General Level of consciousness: awake and alert Pain management: pain level controlled Vital Signs Assessment: post-procedure vital signs reviewed and stable Respiratory status: spontaneous breathing, nonlabored ventilation, respiratory function stable and patient connected to nasal cannula oxygen Cardiovascular status: blood pressure returned to baseline and stable Postop Assessment: no apparent nausea or vomiting Anesthetic complications: no   No complications documented.   Last Vitals:  Vitals:   12/29/19 1408 12/29/19 1425  BP: 133/77 113/60  Pulse: 72 94  Resp: 15 17  Temp: (!) 36.2 C 36.6 C  SpO2: 93% 97%    Last Pain:  Vitals:   12/30/19 0859  TempSrc:   PainSc: 0-No pain                 Yevette Edwards

## 2020-01-31 NOTE — Progress Notes (Signed)
02/01/2020 2:57 PM   [name] September 10, 1975 [id number]  Referring provider: Jacelyn Pi, NP [address] [place],  [place] [address] Chief Complaint  Patient presents with  . Nephrolithiasis    HPI: [name] is a 44 y.o. female who presents today for 1 month follow up.    -S/p right ureteral stent placement late 11/2019 for a right proximal ureteral calculus with pyelonephritis. -S/p right ureteroscopy on 04-Jan-2020; she had passed the ureteral calculus, pyeloscopy showed no calculi but Randall's plaques and midpole calyces -Stent was not replaced -She has no complaints.    PMH: Past Medical History:  Diagnosis Date  . Depression   . Diabetes mellitus without complication (HCC)   . GERD (gastroesophageal reflux disease)   . Headache   . History of kidney stones   . History of nephrolithiasis 2015   via CT imaging, including an obstructing stone  . Hyperlipidemia   . Hypertension 12/2019   per patient, she has never been treated for high blood pressure  . Low back pain 12/2019  . Neuromuscular disorder [facility]    neuropathy    Surgical History: Past Surgical History:  Procedure Laterality Date  . CHOLECYSTECTOMY  1998  . CYSTOSCOPY WITH STENT PLACEMENT Right 12/09/2019   Procedure: CYSTOSCOPY WITH STENT PLACEMENT;  Surgeon: Riki Altes, MD;  Location: [facility] ORS;  Service: Urology;  Laterality: Right;  . CYSTOSCOPY/RETROGRADE/URETEROSCOPY  01/04/2020   Procedure: CYSTOSCOPY/RETROGRADE/URETEROSCOPY;  Surgeon: Riki Altes, MD;  Location: [facility] ORS;  Service: Urology;;    Home Medications:  Allergies as of 02/01/2020      Reactions   Penicillins Hives   Topiramate    Other reaction(s): Other (See Comments) Worsened migraines, N/V   Topiramate Er Nausea And Vomiting   Intensified Migraines   Valtrex [valacyclovir] Itching      Medication List       Accurate as of February 01, 2020  2:57 PM. If you have any  questions, ask your nurse or doctor.        acetaminophen 500 MG tablet Commonly known as: TYLENOL Take 500 mg by mouth as needed.   hydrocortisone cream 1 % Apply 1 application topically as needed for itching. Rash on arms   hydrOXYzine 10 MG tablet Commonly known as: ATARAX/VISTARIL Take 1 tablet (10 mg total) by mouth 3 (three) times daily as needed.   insulin glargine 100 UNIT/ML injection Commonly known as: Lantus Inject 0.66 mLs (66 Units total) into the skin at bedtime. Inject up to 51 units every night, increase by 3 units every 3 days until blood sugar in the morning is between 80-130 mg/dl. What changed: how much to take   liraglutide 18 MG/3ML Sopn Commonly known as: VICTOZA Inject 0.3 mLs (1.8 mg total) into the skin every morning.       Allergies:  Allergies  Allergen Reactions  . Penicillins Hives  . Topiramate     Other reaction(s): Other (See Comments) Worsened migraines, N/V  . Topiramate Er Nausea And Vomiting    Intensified Migraines   . Valtrex [Valacyclovir] Itching    Family History: Family History  Problem Relation Age of Onset  . Kidney failure Father   . Pancreatic cancer Maternal Grandmother     Social History:  reports that she has never smoked. She has never used smokeless tobacco. She reports that she does not drink alcohol and does not use drugs.   Physical Exam: BP 123/79   Pulse 81  Ht 5\' 8"  (1.727 m)   Wt 280 lb (127 kg)   BMI 42.57 kg/m   Constitutional:  Alert and oriented, No acute distress. HEENT: [place] AT, moist mucus membranes.  Trachea midline, no masses. Cardiovascular: No clubbing, cyanosis, or edema. Respiratory: Normal respiratory effort, no increased work of breathing. Skin: No rashes, bruises or suspicious lesions. Neurologic: Grossly intact, no focal deficits, moving all 4 extremities. Psychiatric: Normal mood and affect.   Assessment & Plan:    1. Nephrolithiasis  Recommend metabolic evaluation to  include 24-hour urine study and blood work  She is in agreement and desires to proceed   [facility] [address] [place], [place] [id number]  I, [phone], am acting as a scribe for Dr. Theador Hawthorne C. Amoria Mclees,  I have reviewed the above documentation for accuracy and completeness, and I agree with the above.   Lorin Picket, MD

## 2020-02-01 ENCOUNTER — Ambulatory Visit (INDEPENDENT_AMBULATORY_CARE_PROVIDER_SITE_OTHER): Payer: Self-pay | Admitting: Urology

## 2020-02-01 ENCOUNTER — Encounter: Payer: Self-pay | Admitting: Urology

## 2020-02-01 ENCOUNTER — Other Ambulatory Visit: Payer: Self-pay

## 2020-02-01 VITALS — BP 123/79 | HR 81 | Ht 68.0 in | Wt 280.0 lb

## 2020-02-01 DIAGNOSIS — N200 Calculus of kidney: Secondary | ICD-10-CM

## 2020-02-01 NOTE — Patient Instructions (Signed)

## 2020-02-02 ENCOUNTER — Telehealth: Payer: Self-pay | Admitting: Pharmacist

## 2020-02-02 NOTE — Telephone Encounter (Signed)
02/02/2020 12:10:37 PM - Lantus Vials refill to [facility]  -- Rhetta Mura - Tuesday, February 02, 2020 12:09 PM --Sending [employer] refill request for Lantus Vials Inject 72 units daily at bedtime # 8 vials to [facility] for Lanora Manis to sign.

## 2020-02-03 ENCOUNTER — Encounter: Payer: Self-pay | Admitting: Urology

## 2020-02-15 ENCOUNTER — Telehealth: Payer: Self-pay | Admitting: Pharmacist

## 2020-02-15 ENCOUNTER — Other Ambulatory Visit: Payer: Self-pay

## 2020-02-15 NOTE — Telephone Encounter (Signed)
02/15/2020 8:58:09 AM - Lantus vials refill to [employer]  -- Rhetta Mura - Monday, February 15, 2020 8:57 AM --Arneta Cliche [employer] refill for Lantus Vials Inject 72 units daily at bedtime # 8 vials.

## 2020-02-22 ENCOUNTER — Other Ambulatory Visit: Payer: Self-pay | Admitting: Gerontology

## 2020-02-22 DIAGNOSIS — Z794 Long term (current) use of insulin: Secondary | ICD-10-CM

## 2020-02-22 DIAGNOSIS — E1142 Type 2 diabetes mellitus with diabetic polyneuropathy: Secondary | ICD-10-CM

## 2020-02-23 ENCOUNTER — Telehealth: Payer: Self-pay | Admitting: Gerontology

## 2020-03-01 ENCOUNTER — Telehealth: Payer: Self-pay | Admitting: Gerontology

## 2020-03-02 ENCOUNTER — Other Ambulatory Visit: Payer: Self-pay | Admitting: Urology

## 2020-03-03 ENCOUNTER — Other Ambulatory Visit: Payer: Self-pay

## 2020-03-03 ENCOUNTER — Ambulatory Visit (INDEPENDENT_AMBULATORY_CARE_PROVIDER_SITE_OTHER): Payer: Self-pay | Admitting: Urology

## 2020-03-03 ENCOUNTER — Encounter: Payer: Self-pay | Admitting: Urology

## 2020-03-03 ENCOUNTER — Other Ambulatory Visit: Payer: Self-pay | Admitting: Urology

## 2020-03-03 VITALS — BP 118/71 | HR 84 | Ht 68.0 in | Wt 285.0 lb

## 2020-03-03 DIAGNOSIS — N200 Calculus of kidney: Secondary | ICD-10-CM

## 2020-03-03 MED ORDER — POTASSIUM CITRATE ER 15 MEQ (1620 MG) PO TBCR: EXTENDED_RELEASE_TABLET | ORAL | 11 refills | Status: DC

## 2020-03-03 NOTE — Progress Notes (Signed)
03/03/2020 1:34 PM   [name] 08-24-1975 [id number]  Referring provider: Jacelyn Pi, NP [address] [place],  [place] [address]  Chief Complaint  Patient presents with  . Nephrolithiasis    Urologic history: 1.  Recurrent stone disease -Right ureteral stent placement 11/2019 for right proximal ureteral calculus with sepsis -Right ureteroscopy 12/29/2019 with calculus no longer present; Randall's plaques midpole calyces   HPI: 44 y.o. female presents for review of her 24-hour urine report and blood work.   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 Depression   . Diabetes mellitus without complication (HCC)   . GERD (gastroesophageal reflux disease)   . Headache   . History of kidney stones   . History of nephrolithiasis 2015   via CT imaging, including an obstructing stone  . Hyperlipidemia   . Hypertension 12/2019   per patient, she has never been treated for high blood pressure  . Low back pain 12/2019  . Neuromuscular disorder [facility])    neuropathy    Surgical History: Past Surgical History:  Procedure Laterality Date  . CHOLECYSTECTOMY  1998  . CYSTOSCOPY WITH STENT PLACEMENT Right 12/09/2019   Procedure: CYSTOSCOPY WITH STENT PLACEMENT;  Surgeon: Riki Altes, MD;  Location: [facility] ORS;  Service: Urology;  Laterality: Right;  . CYSTOSCOPY/RETROGRADE/URETEROSCOPY  12/29/2019   Procedure: CYSTOSCOPY/RETROGRADE/URETEROSCOPY;  Surgeon: Riki Altes, MD;  Location: [facility] ORS;  Service: Urology;;    Home Medications:  Allergies as of 03/03/2020      Reactions   Penicillins Hives   Topiramate    Other reaction(s): Other (See Comments) Worsened migraines, N/V   Topiramate Er Nausea And  Vomiting   Intensified Migraines   Valtrex [valacyclovir] Itching      Medication List       Accurate as of March 03, 2020  1:34 PM. If you have any questions, ask your nurse or doctor.        acetaminophen 500 MG tablet Commonly known as: TYLENOL Take 500 mg by mouth as needed.   hydrocortisone cream 1 % Apply 1 application topically as needed for itching. Rash on arms   hydrOXYzine 10 MG tablet Commonly known as: ATARAX/VISTARIL Take 1 tablet (10 mg total) by mouth 3 (three) times daily as needed.   insulin glargine 100 UNIT/ML injection Commonly known as: Lantus Inject 0.66 mLs (66 Units total) into the skin at bedtime. Inject up to 51 units every night, increase by 3 units every 3 days until blood sugar in the morning is between 80-130 mg/dl. What changed: how much to take   liraglutide 18 MG/3ML Sopn Commonly known as: VICTOZA Inject 0.3 mLs (1.8 mg total) into the skin every morning.       Allergies:  Allergies  Allergen Reactions  . Penicillins Hives  . Topiramate     Other reaction(s): Other (See Comments) Worsened migraines, N/V  . Topiramate Er Nausea And Vomiting    Intensified Migraines   . Valtrex [Valacyclovir] Itching    Family History: Family History  Problem Relation Age of Onset  . Kidney failure Father   . Pancreatic cancer Maternal Grandmother     Social History:  reports that she has never smoked. She has never used smokeless tobacco. She  reports that she does not drink alcohol and does not use drugs.   Physical Exam: BP 118/71   Pulse 84   Ht 5\' 8"  (1.727 m)   Wt 285 lb (129.3 kg)   BMI 43.33 kg/m   Constitutional:  Alert and oriented, No acute distress. HEENT: [place] AT, moist mucus membranes.  Trachea midline, no masses. Cardiovascular: No clubbing, cyanosis, or edema. Respiratory: Normal respiratory effort, no increased work of breathing.   Assessment & Plan:    1.  Recurrent nephrolithiasis  24-hour urine study  remarkable for hyperuricosuria and low urine pH  Borderline hyperoxaluria and calcium oxalate supersaturation  Recommend continued good fluid intake to keep urine output 2+ liters per day  She was given literature on dietary oxalate moderation  She eats meat with every meal and we discussed reducing protein intake  Start potassium citrate for urinary alkalinization  Follow-up 6 months with KUB  We will discuss repeat metabolic evaluation at that visit   , MD  [facility] [address] [place], [place] [phone]

## 2020-03-04 ENCOUNTER — Telehealth: Payer: Self-pay | Admitting: Pharmacist

## 2020-03-04 NOTE — Telephone Encounter (Signed)
03/04/2020 2:58:59 PM - Victoza & tips forms to pat & dr  -- Rhetta Mura - Friday, March 04, 2020 2:57 PM --Mailing patient her portion of [employer] renewal application for [employer] 1.8mg  once every morning # 4 boxes & Novo fine 32G tips # 2 boxes, also sending to [facility] for Dr. Candelaria Stagers to sign.

## 2020-03-07 ENCOUNTER — Encounter: Payer: Self-pay | Admitting: Urology

## 2020-03-08 ENCOUNTER — Ambulatory Visit: Payer: Self-pay | Admitting: Gerontology

## 2020-03-08 ENCOUNTER — Other Ambulatory Visit: Payer: Self-pay

## 2020-03-08 VITALS — BP 118/74 | HR 64 | Wt 277.0 lb

## 2020-03-08 DIAGNOSIS — E1142 Type 2 diabetes mellitus with diabetic polyneuropathy: Secondary | ICD-10-CM

## 2020-03-08 DIAGNOSIS — E1165 Type 2 diabetes mellitus with hyperglycemia: Secondary | ICD-10-CM

## 2020-03-08 DIAGNOSIS — R600 Localized edema: Secondary | ICD-10-CM | POA: Insufficient documentation

## 2020-03-08 DIAGNOSIS — Z794 Long term (current) use of insulin: Secondary | ICD-10-CM

## 2020-03-08 NOTE — Patient Instructions (Addendum)
Carbohydrate Counting for Diabetes Mellitus, Adult  Carbohydrate counting is a method of keeping track of how many carbohydrates you eat. Eating carbohydrates naturally increases the amount of sugar (glucose) in the blood. Counting how many carbohydrates you eat helps keep your blood glucose within normal limits, which helps you manage your diabetes (diabetes mellitus). It is important to know how many carbohydrates you can safely have in each meal. This is different for every person. A diet and nutrition specialist (registered dietitian) can help you make a meal plan and calculate how many carbohydrates you should have at each meal and snack. Carbohydrates are found in the following foods:  Grains, such as breads and cereals.  Dried beans and soy products.  Starchy vegetables, such as potatoes, peas, and corn.  Fruit and fruit juices.  Milk and yogurt.  Sweets and snack foods, such as cake, cookies, candy, chips, and soft drinks. How  DASH Eating Plan DASH stands for "Dietary Approaches to Stop Hypertension." The DASH eating plan is a healthy eating plan that has been shown to reduce high blood pressure (hypertension). It may also reduce your risk for type 2 diabetes, heart disease, and stroke. The DASH eating plan may also help with weight loss. What are tips for following this plan?  General guidelines  Avoid eating more than 2,300 mg (milligrams) of salt (sodium) a day. If you have hypertension, you may need to reduce your sodium intake to 1,500 mg a day.  Limit alcohol intake to no more than 1 drink a day for nonpregnant women and 2 drinks a day for men. One drink equals 12 oz of beer, 5 oz of wine, or 1 oz of hard liquor.  Work with your health care provider to maintain a healthy body weight or to lose weight. Ask what an ideal weight is for you.  Get at least 30 minutes of exercise that causes your heart to beat faster (aerobic exercise) most days of the week. Activities may  include walking, swimming, or biking.  Work with your health care provider or diet and nutrition specialist (dietitian) to adjust your eating plan to your individual calorie needs. Reading food labels   Check food labels for the amount of sodium per serving. Choose foods with less than 5 percent of the Daily Value of sodium. Generally, foods with less than 300 mg of sodium per serving fit into this eating plan.  To find whole grains, look for the word "whole" as the first word in the ingredient list. Shopping  Buy products labeled as "low-sodium" or "no salt added."  Buy fresh foods. Avoid canned foods and premade or frozen meals. Cooking  Avoid adding salt when cooking. Use salt-free seasonings or herbs instead of table salt or sea salt. Check with your health care provider or pharmacist before using salt substitutes.  Do not fry foods. Cook foods using healthy methods such as baking, boiling, grilling, and broiling instead.  Cook with heart-healthy oils, such as olive, canola, soybean, or sunflower oil. Meal planning  Eat a balanced diet that includes: ? 5 or more servings of fruits and vegetables each day. At each meal, try to fill half of your plate with fruits and vegetables. ? Up to 6-8 servings of whole grains each day. ? Less than 6 oz of lean meat, poultry, or fish each day. A 3-oz serving of meat is about the same size as a deck of cards. One egg equals 1 oz. ? 2 servings of low-fat dairy each day. ?  A serving of nuts, seeds, or beans 5 times each week. ? Heart-healthy fats. Healthy fats called Omega-3 fatty acids are found in foods such as flaxseeds and coldwater fish, like sardines, salmon, and mackerel.  Limit how much you eat of the following: ? Canned or prepackaged foods. ? Food that is high in trans fat, such as fried foods. ? Food that is high in saturated fat, such as fatty meat. ? Sweets, desserts, sugary drinks, and other foods with added sugar. ? Full-fat  dairy products.  Do not salt foods before eating.  Try to eat at least 2 vegetarian meals each week.  Eat more home-cooked food and less restaurant, buffet, and fast food.  When eating at a restaurant, ask that your food be prepared with less salt or no salt, if possible. What foods are recommended? The items listed may not be a complete list. Talk with your dietitian about what dietary choices are best for you. Grains Whole-grain or whole-wheat bread. Whole-grain or whole-wheat pasta. Brown rice. Orpah Cobb. Bulgur. Whole-grain and low-sodium cereals. Pita bread. Low-fat, low-sodium crackers. Whole-wheat flour tortillas. Vegetables Fresh or frozen vegetables (raw, steamed, roasted, or grilled). Low-sodium or reduced-sodium tomato and vegetable juice. Low-sodium or reduced-sodium tomato sauce and tomato paste. Low-sodium or reduced-sodium canned vegetables. Fruits All fresh, dried, or frozen fruit. Canned fruit in natural juice (without added sugar). Meat and other protein foods Skinless chicken or [place]. Ground chicken or [place]. Pork with fat trimmed off. Fish and seafood. Egg whites. Dried beans, peas, or lentils. Unsalted nuts, nut butters, and seeds. Unsalted canned beans. Lean cuts of beef with fat trimmed off. Low-sodium, lean deli meat. Dairy Low-fat (1%) or fat-free (skim) milk. Fat-free, low-fat, or reduced-fat cheeses. Nonfat, low-sodium ricotta or cottage cheese. Low-fat or nonfat yogurt. Low-fat, low-sodium cheese. Fats and oils Soft margarine without trans fats. Vegetable oil. Low-fat, reduced-fat, or light mayonnaise and salad dressings (reduced-sodium). Canola, safflower, olive, soybean, and sunflower oils. Avocado. Seasoning and other foods Herbs. Spices. Seasoning mixes without salt. Unsalted popcorn and pretzels. Fat-free sweets. What foods are not recommended? The items listed may not be a complete list. Talk with your dietitian about what dietary choices are best  for you. Grains Baked goods made with fat, such as croissants, muffins, or some breads. Dry pasta or rice meal packs. Vegetables Creamed or fried vegetables. Vegetables in a cheese sauce. Regular canned vegetables (not low-sodium or reduced-sodium). Regular canned tomato sauce and paste (not low-sodium or reduced-sodium). Regular tomato and vegetable juice (not low-sodium or reduced-sodium). Rosita Fire. Olives. Fruits Canned fruit in a light or heavy syrup. Fried fruit. Fruit in cream or butter sauce. Meat and other protein foods Fatty cuts of meat. Ribs. Fried meat. Tomasa Blase. Sausage. Bologna and other processed lunch meats. Salami. Fatback. Hotdogs. Bratwurst. Salted nuts and seeds. Canned beans with added salt. Canned or smoked fish. Whole eggs or egg yolks. Chicken or [place] with skin. Dairy Whole or 2% milk, cream, and half-and-half. Whole or full-fat cream cheese. Whole-fat or sweetened yogurt. Full-fat cheese. Nondairy creamers. Whipped toppings. Processed cheese and cheese spreads. Fats and oils Butter. Stick margarine. Lard. Shortening. Ghee. Bacon fat. Tropical oils, such as coconut, palm kernel, or palm oil. Seasoning and other foods Salted popcorn and pretzels. Onion salt, garlic salt, seasoned salt, table salt, and sea salt. Worcestershire sauce. Tartar sauce. Barbecue sauce. Teriyaki sauce. Soy sauce, including reduced-sodium. Steak sauce. Canned and packaged gravies. Fish sauce. Oyster sauce. Cocktail sauce. Horseradish that you find on the shelf. Ketchup.  Mustard. Meat flavorings and tenderizers. Bouillon cubes. Hot sauce and Tabasco sauce. Premade or packaged marinades. Premade or packaged taco seasonings. Relishes. Regular salad dressings. Where to find more information:  National Heart, Lung, and Blood Institute: PopSteam.iswww.nhlbi.nih.gov  American Heart Association: www.heart.org Summary  The DASH eating plan is a healthy eating plan that has been shown to reduce high blood pressure  (hypertension). It may also reduce your risk for type 2 diabetes, heart disease, and stroke.  With the DASH eating plan, you should limit salt (sodium) intake to 2,300 mg a day. If you have hypertension, you may need to reduce your sodium intake to 1,500 mg a day.  When on the DASH eating plan, aim to eat more fresh fruits and vegetables, whole grains, lean proteins, low-fat dairy, and heart-healthy fats.  Work with your health care provider or diet and nutrition specialist (dietitian) to adjust your eating plan to your individual calorie needs. This information is not intended to replace advice given to you by your health care provider. Make sure you discuss any questions you have with your health care provider. Document Revised: 04/12/2017 Document Reviewed: 04/23/2016 Elsevier Patient Education  2020 [employer] do I count carbohydrates? There are two ways to count carbohydrates in food. You can use either of the methods or a combination of both. Reading "Nutrition Facts" on packaged food The "Nutrition Facts" list is included on the labels of almost all packaged foods and beverages in the [place] It includes:  The serving size.  Information about nutrients in each serving, including the grams (g) of carbohydrate per serving. To use the "Nutrition Facts":  Decide how many servings you will have.  Multiply the number of servings by the number of carbohydrates per serving.  The resulting number is the total amount of carbohydrates that you will be having. Learning standard serving sizes of other foods When you eat carbohydrate foods that are not packaged or do not include "Nutrition Facts" on the label, you need to measure the servings in order to count the amount of carbohydrates:  Measure the foods that you will eat with a food scale or measuring cup, if needed.  Decide how many standard-size servings you will eat.  Multiply the number of servings by 15. Most carbohydrate-rich  foods have about 15 g of carbohydrates per serving. ? For example, if you eat 8 oz (170 g) of strawberries, you will have eaten 2 servings and 30 g of carbohydrates (2 servings x 15 g = 30 g).  For foods that have more than one food mixed, such as soups and casseroles, you must count the carbohydrates in each food that is included. The following list contains standard serving sizes of common carbohydrate-rich foods. Each of these servings has about 15 g of carbohydrates:   hamburger bun or  English muffin.   oz (15 mL) syrup.   oz (14 g) jelly.  1 slice of bread.  1 six-inch tortilla.  3 oz (85 g) cooked rice or pasta.  4 oz (113 g) cooked dried beans.  4 oz (113 g) starchy vegetable, such as peas, corn, or potatoes.  4 oz (113 g) hot cereal.  4 oz (113 g) mashed potatoes or  of a large baked potato.  4 oz (113 g) canned or frozen fruit.  4 oz (120 mL) fruit juice.  4-6 crackers.  6 chicken nuggets.  6 oz (170 g) unsweetened dry cereal.  6 oz (170 g) plain fat-free yogurt or yogurt sweetened with  artificial sweeteners.  8 oz (240 mL) milk.  8 oz (170 g) fresh fruit or one small piece of fruit.  24 oz (680 g) popped popcorn. Example of carbohydrate counting Sample meal  3 oz (85 g) chicken breast.  6 oz (170 g) brown rice.  4 oz (113 g) corn.  8 oz (240 mL) milk.  8 oz (170 g) strawberries with sugar-free whipped topping. Carbohydrate calculation 1. Identify the foods that contain carbohydrates: ? Rice. ? Corn. ? Milk. ? Strawberries. 2. Calculate how many servings you have of each food: ? 2 servings rice. ? 1 serving corn. ? 1 serving milk. ? 1 serving strawberries. 3. Multiply each number of servings by 15 g: ? 2 servings rice x 15 g = 30 g. ? 1 serving corn x 15 g = 15 g. ? 1 serving milk x 15 g = 15 g. ? 1 serving strawberries x 15 g = 15 g. 4. Add together all of the amounts to find the total grams of carbohydrates eaten: ? 30 g + 15 g  + 15 g + 15 g = 75 g of carbohydrates total. Summary  Carbohydrate counting is a method of keeping track of how many carbohydrates you eat.  Eating carbohydrates naturally increases the amount of sugar (glucose) in the blood.  Counting how many carbohydrates you eat helps keep your blood glucose within normal limits, which helps you manage your diabetes.  A diet and nutrition specialist (registered dietitian) can help you make a meal plan and calculate how many carbohydrates you should have at each meal and snack. This information is not intended to replace advice given to you by your health care provider. Make sure you discuss any questions you have with your health care provider. Document Revised: 11/22/2016 Document Reviewed: 10/12/2015 Elsevier Patient Education  2020 [employer].

## 2020-03-08 NOTE — Progress Notes (Signed)
Established Patient Office Visit  Subjective:  Patient ID: [name], female    DOB: 02-26-1976  Age: 44 y.o. MRN: [id number]  CC: No chief complaint on file.   HPI [name] presents for follow up of type 2 diabetes and c/o edema to BLE that has been going on for more than 6 months. She states that prior to the beginning of summer the swelling subsides with elevating her legs but since she's being eating out, it stays swollen even with elevation. She denies claudication and erythema. She reports that she's unable to cook because her cooking stove is not working .She missed her lab appointments, last HgbA1c was done on 11/11/2019 and it was 8.7%. She states that she checks her blood glucose bid and it's usually between 270-280 mg/dl and her fasting readings are usually between 250-270 mg/dl. She denies hypoglycemic symptoms, peripheral neuropathy and occasional polyphagia. She performs daily foot checks. Overall, she states that she's doing well and offers no further complaint.  Past Medical History:  Diagnosis Date  . Depression   . Diabetes mellitus without complication ([place])   . GERD (gastroesophageal reflux disease)   . Headache   . History of kidney stones   . History of nephrolithiasis 2015   via CT imaging, including an obstructing stone  . Hyperlipidemia   . Hypertension 12/2019   per patient, she has never been treated for high blood pressure  . Low back pain 12/2019  . Neuromuscular disorder ([place])    neuropathy    Past Surgical History:  Procedure Laterality Date  . CHOLECYSTECTOMY  1998  . CYSTOSCOPY WITH STENT PLACEMENT Right 12/09/2019   Procedure: CYSTOSCOPY WITH STENT PLACEMENT;  Surgeon: Abbie Sons, MD;  Location: [facility] ORS;  Service: Urology;  Laterality: Right;  . CYSTOSCOPY/RETROGRADE/URETEROSCOPY  12/29/2019   Procedure: CYSTOSCOPY/RETROGRADE/URETEROSCOPY;  Surgeon: Abbie Sons, MD;  Location: [facility] OR[facility];  Service: Urology;;     Family History  Problem Relation Age of Onset  . Kidney failure Father   . Pancreatic cancer Maternal Grandmother     Social History   Socioeconomic History  . Marital status: Single    Spouse name: Not on file  . Number of children: Not on file  . Years of education: Not on file  . Highest education level: Not on file  Occupational History  . Occupation: not currently working  Tobacco Use  . Smoking status: Never Smoker  . Smokeless tobacco: Never Used  Vaping Use  . Vaping Use: Never used  Substance and Sexual Activity  . Alcohol use: No  . Drug use: No  . Sexual activity: Never  Other Topics Concern  . Not on file  Social History Narrative   - Needs a blood sugar monitor       Patient said she is living with her mom and things are going ok. Mom provides for her food, transport, etc.       Patient wants to wait until next visit before any information is shared.    Social Determinants of Health   Financial Resource Strain:   . Difficulty of Paying Living Expenses: Not on file  Food Insecurity:   . Worried About Charity fundraiser in the Last Year: Not on file  . Ran Out of Food in the Last Year: Not on file  Transportation Needs:   . Lack of Transportation (Medical): Not on file  . Lack of Transportation (Non-Medical): Not on file  Physical Activity:   .  Days of Exercise per Week: Not on file  . Minutes of Exercise per Session: Not on file  Stress:   . Feeling of Stress : Not on file  Social Connections:   . Frequency of Communication with Friends and Family: Not on file  . Frequency of Social Gatherings with Friends and Family: Not on file  . Attends Religious Services: Not on file  . Active Member of Clubs or Organizations: Not on file  . Attends Archivist Meetings: Not on file  . Marital Status: Not on file  Intimate Partner Violence:   . Fear of Current or Ex-Partner: Not on file  . Emotionally Abused: Not on file  . Physically Abused:  Not on file  . Sexually Abused: Not on file    Outpatient Medications Prior to Visit  Medication Sig Dispense Refill  . acetaminophen (TYLENOL) 500 MG tablet Take 650 mg by mouth as needed (toothache).     . insulin glargine (LANTUS) 100 UNIT/ML injection Inject 0.66 mLs (66 Units total) into the skin at bedtime. Inject up to 51 units every night, increase by 3 units every 3 days until blood sugar in the morning is between 80-130 mg/dl. (Patient taking differently: Inject 72 Units into the skin at bedtime. Inject up to 51 units every night, increase by 3 units every 3 days until blood sugar in the morning is between 80-130 mg/dl.) 80 mL 0  . liraglutide (VICTOZA) 18 MG/3ML SOPN Inject 0.3 mLs (1.8 mg total) into the skin every morning. 4 pen 3  . Potassium Citrate 15 MEQ (1620 MG) TBCR 1 tab twice daily with meals 60 tablet 11  . hydrocortisone cream 1 % Apply 1 application topically as needed for itching. Rash on arms (Patient not taking: Reported on 03/08/2020)    . hydrOXYzine (ATARAX/VISTARIL) 10 MG tablet Take 1 tablet (10 mg total) by mouth 3 (three) times daily as needed. (Patient not taking: Reported on 03/08/2020) 15 tablet 0   No facility-administered medications prior to visit.    Allergies  Allergen Reactions  . Penicillins Hives  . Topiramate     Other reaction(s): Other (See Comments) Worsened migraines, N/V  . Topiramate Er Nausea And Vomiting    Intensified Migraines   . Valtrex [Valacyclovir] Itching    ROS Review of Systems  Constitutional: Negative.   Eyes: Negative.   Respiratory: Negative.   Cardiovascular: Positive for leg swelling.  Endocrine: Positive for polyphagia.  Neurological: Negative.   Psychiatric/Behavioral: Negative.       Objective:    Physical Exam HENT:     Head: Normocephalic and atraumatic.  Eyes:     Pupils: Pupils are equal, round, and reactive to light.  Cardiovascular:     Rate and Rhythm: Normal rate and regular rhythm.      Pulses: Normal pulses.     Heart sounds: Normal heart sounds.  Pulmonary:     Effort: Pulmonary effort is normal.     Breath sounds: Normal breath sounds.  Musculoskeletal:     Right lower leg: Edema (+1 edema to BLE and feet) present.     Left lower leg: Edema present.  Skin:    General: Skin is warm and dry.  Neurological:     General: No focal deficit present.     Mental Status: She is alert and oriented to person, place, and time. Mental status is at baseline.  Psychiatric:        Mood and Affect: Mood normal.  Behavior: Behavior normal.        Thought Content: Thought content normal.        Judgment: Judgment normal.     BP 118/74 (BP Location: Left Arm, Patient Position: Sitting, Cuff Size: Large)   Pulse 64   Wt 277 lb (125.6 kg)   BMI 42.12 kg/m  Wt Readings from Last 3 Encounters:  03/08/20 277 lb (125.6 kg)  03/03/20 285 lb (129.3 kg)  02/01/20 280 lb (127 kg)   She lost 8 pounds in 5 days and was encouraged to continue her weight loss regimen.  Health Maintenance Due  Topic Date Due  . Hepatitis C Screening  Never done  . PNEUMOCOCCAL POLYSACCHARIDE VACCINE AGE 70-64 HIGH RISK  Never done  . COVID-19 Vaccine (1) Never done  . TETANUS/TDAP  Never done  . PAP SMEAR-Modifier  Never done  . OPHTHALMOLOGY EXAM  07/04/2018  . INFLUENZA VACCINE  Never done    There are no preventive care reminders to display for this patient.  Lab Results  Component Value Date   TSH 2.200 01/16/2018   Lab Results  Component Value Date   WBC 10.0 12/11/2019   HGB 11.1 12/11/2019   HCT 39.1 12/11/2019   MCV 77 (L) 12/11/2019   PLT 399 12/08/2019   Lab Results  Component Value Date   NA 135 12/25/2019   K 3.6 12/25/2019   CO2 27 12/25/2019   GLUCOSE 247 (H) 12/25/2019   BUN 12 12/25/2019   CREATININE 0.65 12/25/2019   BILITOT 1.1 12/08/2019   ALKPHOS 102 12/08/2019   AST 16 12/08/2019   ALT 17 12/08/2019   PROT 8.2 (H) 12/08/2019   ALBUMIN 4.2 12/08/2019    CALCIUM 9.2 12/25/2019   ANIONGAP 8 12/25/2019   Lab Results  Component Value Date   CHOL 142 11/11/2019   Lab Results  Component Value Date   HDL 32 (L) 11/11/2019   Lab Results  Component Value Date   LDLCALC 94 11/11/2019   Lab Results  Component Value Date   TRIG 84 11/11/2019   Lab Results  Component Value Date   CHOLHDL 4.4 11/11/2019   Lab Results  Component Value Date   HGBA1C 8.7 (H) 11/11/2019      Assessment & Plan:     1. Type 2 diabetes mellitus with hyperglycemia, with long-term current use of insulin (HCC) -Her HgbA1c was 8.7% and her goal was less than 7%. She will continue on her current treatment regimen, low carb and non concentrated sweet and exercise as tolerated. - HgB A1c; Future  2. Bilateral edema of lower extremity - Will check labs , was advised to elevate legs while sitting down and wear compression stockings. - Comp Met (CMET); Future - Urinalysis; Future - B Nat Peptide; Future   Follow-up: Return in about 15 days (around 03/23/2020), or if symptoms worsen or fail to improve.    Nakhi Choi Jerold Coombe, NP

## 2020-03-09 MED ORDER — INSULIN GLARGINE 100 UNIT/ML ~~LOC~~ SOLN: 72.0000 [IU] | Freq: Every day | SUBCUTANEOUS | 5 refills | Status: DC

## 2020-03-10 ENCOUNTER — Telehealth: Payer: Self-pay | Admitting: Gerontology

## 2020-03-11 ENCOUNTER — Other Ambulatory Visit: Payer: Self-pay

## 2020-03-16 ENCOUNTER — Other Ambulatory Visit: Payer: Self-pay

## 2020-03-17 ENCOUNTER — Telehealth: Payer: Self-pay | Admitting: Pharmacy Technician

## 2020-03-17 NOTE — Telephone Encounter (Signed)
During MTM it was discussed that patient prepare foods at home instead of eating fast foods for every meal.  Patient stated that stove has been broken for 3 months.  Cannot afford to pay for repair or purchase a new stove.  Contacted [employer] and [employer] inquiring about funding for stove.  Both agencies stated that they did not have funds to help this patient.  Contacted [facility]'s [employer].  [place] [employer] stated that they would purchase stove for patient.  Contacted [employer] to make arrangements for the purchase and delivery of stove.  Stove to be delivered and set-up for patient on 03/21/20.  Sherilyn Dacosta Care Manager [facility]

## 2020-03-23 ENCOUNTER — Ambulatory Visit: Payer: Self-pay | Admitting: Gerontology

## 2020-03-23 ENCOUNTER — Other Ambulatory Visit: Payer: Self-pay

## 2020-03-23 DIAGNOSIS — E1165 Type 2 diabetes mellitus with hyperglycemia: Secondary | ICD-10-CM

## 2020-03-23 DIAGNOSIS — R600 Localized edema: Secondary | ICD-10-CM

## 2020-03-24 LAB — COMPREHENSIVE METABOLIC PANEL
ALT: 14 IU/L (ref 0–32)
AST: 12 IU/L (ref 0–40)
Albumin/Globulin Ratio: 1.2 (ref 1.2–2.2)
Albumin: 4 g/dL (ref 3.8–4.8)
Alkaline Phosphatase: 125 IU/L — ABNORMAL HIGH (ref 44–121)
BUN/Creatinine Ratio: 13 (ref 9–23)
BUN: 11 mg/dL (ref 6–24)
Bilirubin Total: 0.5 mg/dL (ref 0.0–1.2)
CO2: 24 mmol/L (ref 20–29)
Calcium: 9.6 mg/dL (ref 8.7–10.2)
Chloride: 101 mmol/L (ref 96–106)
Creatinine, Ser: 0.83 mg/dL (ref 0.57–1.00)
GFR calc Af Amer: 98 mL/min/{1.73_m2} (ref >59)
GFR calc non Af Amer: 85 mL/min/{1.73_m2} (ref >59)
Globulin, Total: 3.4 g/dL (ref 1.5–4.5)
Glucose: 118 mg/dL — ABNORMAL HIGH (ref 65–99)
Potassium: 4.4 mmol/L (ref 3.5–5.2)
Sodium: 139 mmol/L (ref 134–144)
Total Protein: 7.4 g/dL (ref 6.0–8.5)

## 2020-03-24 LAB — HEMOGLOBIN A1C
Est. average glucose Bld gHb Est-mCnc: 200 mg/dL
Hgb A1c MFr Bld: 8.6 % — ABNORMAL HIGH (ref 4.8–5.6)

## 2020-03-24 LAB — URINALYSIS
Bilirubin, UA: NEGATIVE
Glucose, UA: NEGATIVE
Ketones, UA: NEGATIVE
Leukocytes,UA: NEGATIVE
Nitrite, UA: NEGATIVE
Protein,UA: NEGATIVE
Specific Gravity, UA: 1.016 (ref 1.005–1.030)
Urobilinogen, Ur: 0.2 mg/dL (ref 0.2–1.0)
pH, UA: 5.5 (ref 5.0–7.5)

## 2020-03-24 LAB — BRAIN NATRIURETIC PEPTIDE: BNP: 18.2 pg/mL (ref 0.0–100.0)

## 2020-03-29 ENCOUNTER — Encounter: Payer: Self-pay | Admitting: Gerontology

## 2020-03-29 ENCOUNTER — Ambulatory Visit: Payer: Self-pay | Admitting: Gerontology

## 2020-03-29 ENCOUNTER — Other Ambulatory Visit: Payer: Self-pay | Admitting: Gerontology

## 2020-03-29 ENCOUNTER — Other Ambulatory Visit: Payer: Self-pay

## 2020-03-29 VITALS — BP 136/71 | HR 97 | Temp 97.5°F | Resp 16 | Wt 280.4 lb

## 2020-03-29 DIAGNOSIS — G629 Polyneuropathy, unspecified: Secondary | ICD-10-CM

## 2020-03-29 DIAGNOSIS — M25512 Pain in left shoulder: Secondary | ICD-10-CM

## 2020-03-29 DIAGNOSIS — E118 Type 2 diabetes mellitus with unspecified complications: Secondary | ICD-10-CM

## 2020-03-29 DIAGNOSIS — I499 Cardiac arrhythmia, unspecified: Secondary | ICD-10-CM

## 2020-03-29 MED ORDER — LIRAGLUTIDE 18 MG/3ML ~~LOC~~ SOPN: 1.8000 mg | PEN_INJECTOR | Freq: Every morning | SUBCUTANEOUS | 5 refills | Status: DC

## 2020-03-29 NOTE — Patient Instructions (Signed)
Carbohydrate Counting for Diabetes Mellitus, Adult  Carbohydrate counting is a method of keeping track of how many carbohydrates you eat. Eating carbohydrates naturally increases the amount of sugar (glucose) in the blood. Counting how many carbohydrates you eat helps keep your blood glucose within normal limits, which helps you manage your diabetes (diabetes mellitus). It is important to know how many carbohydrates you can safely have in each meal. This is different for every person. A diet and nutrition specialist (registered dietitian) can help you make a meal plan and calculate how many carbohydrates you should have at each meal and snack. Carbohydrates are found in the following foods:  Grains, such as breads and cereals.  Dried beans and soy products.  Starchy vegetables, such as potatoes, peas, and corn.  Fruit and fruit juices.  Milk and yogurt.  Sweets and snack foods, such as cake, cookies, candy, chips, and soft drinks. How do I count carbohydrates? There are two ways to count carbohydrates in food. You can use either of the methods or a combination of both. Reading "Nutrition Facts" on packaged food The "Nutrition Facts" list is included on the labels of almost all packaged foods and beverages in the [place] It includes:  The serving size.  Information about nutrients in each serving, including the grams (g) of carbohydrate per serving. To use the "Nutrition Facts":  Decide how many servings you will have.  Multiply the number of servings by the number of carbohydrates per serving.  The resulting number is the total amount of carbohydrates that you will be having. Learning standard serving sizes of other foods When you eat carbohydrate foods that are not packaged or do not include "Nutrition Facts" on the label, you need to measure the servings in order to count the amount of carbohydrates:  Measure the foods that you will eat with a food scale or measuring cup, if  needed.  Decide how many standard-size servings you will eat.  Multiply the number of servings by 15. Most carbohydrate-rich foods have about 15 g of carbohydrates per serving. ? For example, if you eat 8 oz (170 g) of strawberries, you will have eaten 2 servings and 30 g of carbohydrates (2 servings x 15 g = 30 g).  For foods that have more than one food mixed, such as soups and casseroles, you must count the carbohydrates in each food that is included. The following list contains standard serving sizes of common carbohydrate-rich foods. Each of these servings has about 15 g of carbohydrates:   hamburger bun or  English muffin.   oz (15 mL) syrup.   oz (14 g) jelly.  1 slice of bread.  1 six-inch tortilla.  3 oz (85 g) cooked rice or pasta.  4 oz (113 g) cooked dried beans.  4 oz (113 g) starchy vegetable, such as peas, corn, or potatoes.  4 oz (113 g) hot cereal.  4 oz (113 g) mashed potatoes or  of a large baked potato.  4 oz (113 g) canned or frozen fruit.  4 oz (120 mL) fruit juice.  4-6 crackers.  6 chicken nuggets.  6 oz (170 g) unsweetened dry cereal.  6 oz (170 g) plain fat-free yogurt or yogurt sweetened with artificial sweeteners.  8 oz (240 mL) milk.  8 oz (170 g) fresh fruit or one small piece of fruit.  24 oz (680 g) popped popcorn. Example of carbohydrate counting Sample meal  3 oz (85 g) chicken breast.  6 oz (170 g)   brown rice.  4 oz (113 g) corn.  8 oz (240 mL) milk.  8 oz (170 g) strawberries with sugar-free whipped topping. Carbohydrate calculation 1. Identify the foods that contain carbohydrates: ? Rice. ? Corn. ? Milk. ? Strawberries. 2. Calculate how many servings you have of each food: ? 2 servings rice. ? 1 serving corn. ? 1 serving milk. ? 1 serving strawberries. 3. Multiply each number of servings by 15 g: ? 2 servings rice x 15 g = 30 g. ? 1 serving corn x 15 g = 15 g. ? 1 serving milk x 15 g = 15 g. ? 1  serving strawberries x 15 g = 15 g. 4. Add together all of the amounts to find the total grams of carbohydrates eaten: ? 30 g + 15 g + 15 g + 15 g = 75 g of carbohydrates total. Summary  Carbohydrate counting is a method of keeping track of how many carbohydrates you eat.  Eating carbohydrates naturally increases the amount of sugar (glucose) in the blood.  Counting how many carbohydrates you eat helps keep your blood glucose within normal limits, which helps you manage your diabetes.  A diet and nutrition specialist (registered dietitian) can help you make a meal plan and calculate how many carbohydrates you should have at each meal and snack. This information is not intended to replace advice given to you by your health care provider. Make sure you discuss any questions you have with your health care provider. Document Revised: 11/22/2016 Document Reviewed: 10/12/2015 Elsevier Patient Education  2020 Elsevier Inc.  

## 2020-03-29 NOTE — Progress Notes (Signed)
Established Patient Office Visit  Subjective:  Patient ID: [name], female    DOB: 10-14-1975  Age: 44 y.o. MRN: [id number]  CC: No chief complaint on file.   HPI [name] presents for follow up of type 2 diabetes, edema and lab review. Her HgbA1c done on 03/23/2020 decreased from 8.7% to 8.6%, she states that she checks her blood glucose bid and her fasting reading this morning was 177 mg/dl. She states that her stove is repaired and she's cooking more instead of eating fast food. She denies hypoglycemia. She reports that the edema to her bilateral lower extremity has resolved. Her BNP and kidney function was within normal limit. Currently, she is c/o non trumatic constant  3/10 dull pain to left shoulder that has been going on for 2-3 weeks. She states that pain radiates to her left arm and left breast. She states that pain intensity to her left shoulder increases with extending and adducting her left arm. She also states that laying on her right side at night aggravates symptoms, and denies any relieving symptoms. She also reports that she experiences intermittent numbness to her left hand especially in the morning. She self discontinued gabapentin many months ago. She denies chest pain, palpitation and light headedness . Overall, she states that she's doing well and offers no further complaint.  Past Medical History:  Diagnosis Date  . Depression   . Diabetes mellitus without complication (HCC)   . GERD (gastroesophageal reflux disease)   . Headache   . History of kidney stones   . History of nephrolithiasis 2015   via CT imaging, including an obstructing stone  . Hyperlipidemia   . Hypertension 12/2019   per patient, she has never been treated for high blood pressure  . Low back pain 12/2019  . Neuromuscular disorder (HCC)    neuropathy    Past Surgical History:  Procedure Laterality Date  . CHOLECYSTECTOMY  1998  . CYSTOSCOPY WITH STENT PLACEMENT  Right 12/09/2019   Procedure: CYSTOSCOPY WITH STENT PLACEMENT;  Surgeon: Riki Altes, MD;  Location: [facility] ORS;  Service: Urology;  Laterality: Right;  . CYSTOSCOPY/RETROGRADE/URETEROSCOPY  12/29/2019   Procedure: CYSTOSCOPY/RETROGRADE/URETEROSCOPY;  Surgeon: Riki Altes, MD;  Location: [facility] OR[facility];  Service: Urology;;    Family History  Problem Relation Age of Onset  . Kidney failure Father   . Pancreatic cancer Maternal Grandmother     Social History   Socioeconomic History  . Marital status: Single    Spouse name: Not on file  . Number of children: Not on file  . Years of education: Not on file  . Highest education level: Not on file  Occupational History  . Occupation: not currently working  Tobacco Use  . Smoking status: Never Smoker  . Smokeless tobacco: Never Used  Vaping Use  . Vaping Use: Never used  Substance and Sexual Activity  . Alcohol use: No  . Drug use: No  . Sexual activity: Never  Other Topics Concern  . Not on file  Social History Narrative   - Needs a blood sugar monitor       Patient said she is living with her mom and things are going ok. Mom provides for her food, transport, etc.       Patient wants to wait until next visit before any information is shared.    Social Determinants of Health   Financial Resource Strain:   . Difficulty of Paying Living Expenses: Not on file  Food Insecurity:   . Worried About Programme researcher, broadcasting/film/video in the Last Year: Not on file  . Ran Out of Food in the Last Year: Not on file  Transportation Needs:   . Lack of Transportation (Medical): Not on file  . Lack of Transportation (Non-Medical): Not on file  Physical Activity:   . Days of Exercise per Week: Not on file  . Minutes of Exercise per Session: Not on file  Stress:   . Feeling of Stress : Not on file  Social Connections:   . Frequency of Communication with Friends and Family: Not on file  . Frequency of Social Gatherings with Friends and Family: Not on  file  . Attends Religious Services: Not on file  . Active Member of Clubs or Organizations: Not on file  . Attends Banker Meetings: Not on file  . Marital Status: Not on file  Intimate Partner Violence:   . Fear of Current or Ex-Partner: Not on file  . Emotionally Abused: Not on file  . Physically Abused: Not on file  . Sexually Abused: Not on file    Outpatient Medications Prior to Visit  Medication Sig Dispense Refill  . acetaminophen (TYLENOL) 500 MG tablet Take 650 mg by mouth as needed (toothache).     . insulin glargine (LANTUS) 100 UNIT/ML injection Inject 0.72 mLs (72 Units total) into the skin at bedtime. Inject up to 51 units every night, increase by 3 units every 3 days until blood sugar in the morning is between 80-130 mg/dl. 10 mL 5  . Potassium Citrate 15 MEQ (1620 MG) TBCR 1 tab twice daily with meals 60 tablet 11  . liraglutide (VICTOZA) 18 MG/3ML SOPN Inject 0.3 mLs (1.8 mg total) into the skin every morning. 4 pen 3   No facility-administered medications prior to visit.    Allergies  Allergen Reactions  . Penicillins Hives  . Topiramate     Other reaction(s): Other (See Comments) Worsened migraines, N/V  . Topiramate Er Nausea And Vomiting    Intensified Migraines   . Valtrex [Valacyclovir] Itching    ROS Review of Systems  Constitutional: Negative.   HENT: Negative.   Respiratory: Negative.   Cardiovascular: Negative.   Gastrointestinal: Negative.   Musculoskeletal: Positive for arthralgias (left shoulder pain).  Neurological: Positive for numbness.  Psychiatric/Behavioral: Negative.       Objective:    Physical Exam HENT:     Head: Normocephalic.  Eyes:     Extraocular Movements: Extraocular movements intact.     Conjunctiva/sclera: Conjunctivae normal.     Pupils: Pupils are equal, round, and reactive to light.  Cardiovascular:     Rate and Rhythm: Rhythm irregular.     Pulses: Normal pulses.     Heart sounds: Normal heart  sounds.  Pulmonary:     Effort: Pulmonary effort is normal.     Breath sounds: Normal breath sounds.  Musculoskeletal:        General: Tenderness (left shoulder with palpation) present.  Skin:    General: Skin is warm and dry.  Neurological:     General: No focal deficit present.     Mental Status: She is alert and oriented to person, place, and time. Mental status is at baseline.  Psychiatric:        Mood and Affect: Mood normal.        Behavior: Behavior normal.        Thought Content: Thought content normal.  Judgment: Judgment normal.     BP 136/71 (BP Location: Left Arm, Patient Position: Sitting, Cuff Size: Large)   Pulse 97   Temp (!) 97.5 F (36.4 C)   Resp 16   Wt 280 lb 6.4 oz (127.2 kg)   SpO2 99%   BMI 42.63 kg/m  Wt Readings from Last 3 Encounters:  03/29/20 280 lb 6.4 oz (127.2 kg)  03/08/20 277 lb (125.6 kg)  03/03/20 285 lb (129.3 kg)    She was encouraged to loose weight  Health Maintenance Due  Topic Date Due  . Hepatitis C Screening  Never done  . PNEUMOCOCCAL POLYSACCHARIDE VACCINE AGE 65-64 HIGH RISK  Never done  . COVID-19 Vaccine (1) Never done  . TETANUS/TDAP  Never done  . PAP SMEAR-Modifier  Never done  . OPHTHALMOLOGY EXAM  07/04/2018  . INFLUENZA VACCINE  Never done    There are no preventive care reminders to display for this patient.  Lab Results  Component Value Date   TSH 2.200 01/16/2018   Lab Results  Component Value Date   WBC 10.0 12/11/2019   HGB 11.1 12/11/2019   HCT 39.1 12/11/2019   MCV 77 (L) 12/11/2019   PLT 399 12/08/2019   Lab Results  Component Value Date   NA 139 03/23/2020   K 4.4 03/23/2020   CO2 24 03/23/2020   GLUCOSE 118 (H) 03/23/2020   BUN 11 03/23/2020   CREATININE 0.83 03/23/2020   BILITOT 0.5 03/23/2020   ALKPHOS 125 (H) 03/23/2020   AST 12 03/23/2020   ALT 14 03/23/2020   PROT 7.4 03/23/2020   ALBUMIN 4.0 03/23/2020   CALCIUM 9.6 03/23/2020   ANIONGAP 8 12/25/2019   Lab Results   Component Value Date   CHOL 142 11/11/2019   Lab Results  Component Value Date   HDL 32 (L) 11/11/2019   Lab Results  Component Value Date   LDLCALC 94 11/11/2019   Lab Results  Component Value Date   TRIG 84 11/11/2019   Lab Results  Component Value Date   CHOLHDL 4.4 11/11/2019   Lab Results  Component Value Date   HGBA1C 8.6 (H) 03/23/2020      Assessment & Plan:   1. Type 2 diabetes mellitus with complication, without long-term current use of insulin (HCC) - Her HgbA1c was 8.6%, and her goal should be less than 7%. She will continue on current treatment regimen, check blood glucose daily, record and bring log to follow up visit. She was advised that her fasting blood glucose reading should be between 80-130 mg/dl. She was advised to continue on low carb/non concentrated sweet diet and exercise as tolerated. - liraglutide (VICTOZA) 18 MG/3ML SOPN; Inject 1.8 mg into the skin every morning.  Dispense: 3 mL; Refill: 5 - Endocrinology referral  2. Acute pain of left shoulder - Ddx Frozen shoulder, she was advised to take tylenol 650 mg every eight 8 hours as tolerated. -She will follow up with [facility] Orthopedic Surgeon Dr Justice Rocher  3. Peripheral polyneuropathy  - Ambulatory referral to Neurology for chronic neuropathy to left hand  4. Irregular heart beat - She was advised to schedule - EKG 12-Lead, and to go to the ED for chest pain.    Follow-up: Return in about 4 weeks (around 04/26/2020), or if symptoms worsen or fail to improve.    Dannis Deroche Trellis Paganini, NP

## 2020-04-04 ENCOUNTER — Ambulatory Visit
Admission: RE | Admit: 2020-04-04 | Discharge: 2020-04-04 | Disposition: A | Discharge status: Home / Self Care | Payer: Self-pay | Source: Ambulatory Visit | Attending: Adult Health Nurse Practitioner | Admitting: Adult Health Nurse Practitioner

## 2020-04-04 ENCOUNTER — Other Ambulatory Visit: Payer: Self-pay

## 2020-04-04 ENCOUNTER — Other Ambulatory Visit: Payer: Self-pay | Admitting: Adult Health Nurse Practitioner

## 2020-04-04 DIAGNOSIS — I499 Cardiac arrhythmia, unspecified: Secondary | ICD-10-CM | POA: Insufficient documentation

## 2020-04-26 ENCOUNTER — Other Ambulatory Visit: Payer: Self-pay

## 2020-04-26 ENCOUNTER — Encounter: Payer: Self-pay | Admitting: Gerontology

## 2020-04-26 ENCOUNTER — Ambulatory Visit: Payer: Self-pay | Admitting: Gerontology

## 2020-04-26 ENCOUNTER — Ambulatory Visit: Payer: Self-pay | Admitting: Specialist

## 2020-04-26 VITALS — BP 143/62 | HR 76 | Temp 97.8°F | Resp 16 | Wt 281.6 lb

## 2020-04-26 DIAGNOSIS — Z794 Long term (current) use of insulin: Secondary | ICD-10-CM

## 2020-04-26 DIAGNOSIS — I499 Cardiac arrhythmia, unspecified: Secondary | ICD-10-CM

## 2020-04-26 DIAGNOSIS — M25512 Pain in left shoulder: Secondary | ICD-10-CM

## 2020-04-26 DIAGNOSIS — E1142 Type 2 diabetes mellitus with diabetic polyneuropathy: Secondary | ICD-10-CM

## 2020-04-26 NOTE — Progress Notes (Signed)
Established Patient Office Visit  Subjective:  Patient ID: [name], female    DOB: 1976-02-03  Age: 44 y.o. MRN: [id number]  CC: No chief complaint on file.   HPI Cyndy Nicha Hemann presents for  follow up of EKG review. Her EKG done on 04/04/2020 was abnormal. She denies palpaitaion, chest pain and light headedness. She was seen by Dr Justice Rocher today for left shoulder pain and Cervical and left shoulder x ray was recommended. She also continues to experience intermittent neuropathy to left fingers and she's yet to follow up with Neurologist. Overall, she states that she's doing well and offers no further complaint.   Past Medical History:  Diagnosis Date   Depression    Diabetes mellitus without complication (HCC)    GERD (gastroesophageal reflux disease)    Headache    History of kidney stones    History of nephrolithiasis 2015   via CT imaging, including an obstructing stone   Hyperlipidemia    Hypertension 12/2019   per patient, she has never been treated for high blood pressure   Low back pain 12/2019   Neuromuscular disorder (HCC)    neuropathy    Past Surgical History:  Procedure Laterality Date   CHOLECYSTECTOMY  1998   CYSTOSCOPY WITH STENT PLACEMENT Right 12/09/2019   Procedure: CYSTOSCOPY WITH STENT PLACEMENT;  Surgeon: Riki Altes, MD;  Location: [facility] ORS;  Service: Urology;  Laterality: Right;   CYSTOSCOPY/RETROGRADE/URETEROSCOPY  12/29/2019   Procedure: CYSTOSCOPY/RETROGRADE/URETEROSCOPY;  Surgeon: Riki Altes, MD;  Location: [facility] OR[facility];  Service: Urology;;    Family History  Problem Relation Age of Onset   Kidney failure Father    Pancreatic cancer Maternal Grandmother     Social History   Socioeconomic History   Marital status: Single    Spouse name: Not on file   Number of children: Not on file   Years of education: Not on file   Highest education level: Not on file  Occupational History   Occupation: not  currently working  Tobacco Use   Smoking status: Never Smoker   Smokeless tobacco: Never Used  Building services engineer Use: Never used  Substance and Sexual Activity   Alcohol use: No   Drug use: No   Sexual activity: Never  Other Topics Concern   Not on file  Social History Narrative   - Needs a blood sugar monitor       Patient said she is living with her mom and things are going ok. Mom provides for her food, transport, etc.       Patient wants to wait until next visit before any information is shared.    Social Determinants of Health   Financial Resource Strain: Not on file  Food Insecurity: Not on file  Transportation Needs: Not on file  Physical Activity: Not on file  Stress: Not on file  Social Connections: Not on file  Intimate Partner Violence: Not on file    Outpatient Medications Prior to Visit  Medication Sig Dispense Refill   acetaminophen (TYLENOL) 500 MG tablet Take 650 mg by mouth as needed (toothache).      insulin glargine (LANTUS) 100 UNIT/ML injection Inject 0.72 mLs (72 Units total) into the skin at bedtime. Inject up to 51 units every night, increase by 3 units every 3 days until blood sugar in the morning is between 80-130 mg/dl. 10 mL 5   liraglutide (VICTOZA) 18 MG/3ML SOPN Inject 1.8 mg into the skin every morning.  3 mL 5   Potassium Citrate 15 MEQ (1620 MG) TBCR 1 tab twice daily with meals 60 tablet 11   No facility-administered medications prior to visit.    Allergies  Allergen Reactions   Penicillins Hives   Topiramate     Other reaction(s): Other (See Comments) Worsened migraines, N/V   Topiramate Er Nausea And Vomiting    Intensified Migraines    Valtrex [Valacyclovir] Itching    ROS Review of Systems  Constitutional: Negative.   Eyes: Negative.   Respiratory: Negative.   Cardiovascular: Negative.   Neurological: Positive for numbness (to left fingers).  Psychiatric/Behavioral: Negative.       Objective:     Physical Exam HENT:     Head: Normocephalic and atraumatic.  Eyes:     Extraocular Movements: Extraocular movements intact.     Conjunctiva/sclera: Conjunctivae normal.     Pupils: Pupils are equal, round, and reactive to light.  Cardiovascular:     Rate and Rhythm: Normal rate. Rhythm irregular.     Pulses: Normal pulses.     Heart sounds: Normal heart sounds.  Pulmonary:     Effort: Pulmonary effort is normal.     Breath sounds: Normal breath sounds.  Neurological:     General: No focal deficit present.     Mental Status: She is alert and oriented to person, place, and time. Mental status is at baseline.  Psychiatric:        Mood and Affect: Mood normal.        Behavior: Behavior normal.        Thought Content: Thought content normal.        Judgment: Judgment normal.     BP (!) 143/62 (BP Location: Left Arm, Patient Position: Sitting, Cuff Size: Large)    Pulse 76    Temp 97.8 F (36.6 C)    Resp 16    Wt 281 lb 9.6 oz (127.7 kg)    SpO2 100%    BMI 42.82 kg/m  Wt Readings from Last 3 Encounters:  04/26/20 281 lb 9.6 oz (127.7 kg)  03/29/20 280 lb 6.4 oz (127.2 kg)  03/08/20 277 lb (125.6 kg)   She was strongly encouraged to loose weight.  Health Maintenance Due  Topic Date Due   Hepatitis C Screening  Never done   PNEUMOCOCCAL POLYSACCHARIDE VACCINE AGE 2-64 HIGH RISK  Never done   COVID-19 Vaccine (1) Never done   TETANUS/TDAP  Never done   PAP SMEAR-Modifier  Never done   OPHTHALMOLOGY EXAM  07/04/2018   INFLUENZA VACCINE  Never done   URINE MICROALBUMIN  06/09/2020    There are no preventive care reminders to display for this patient.  Lab Results  Component Value Date   TSH 2.200 01/16/2018   Lab Results  Component Value Date   WBC 10.0 12/11/2019   HGB 11.1 12/11/2019   HCT 39.1 12/11/2019   MCV 77 (L) 12/11/2019   PLT 399 12/08/2019   Lab Results  Component Value Date   NA 139 03/23/2020   K 4.4 03/23/2020   CO2 24 03/23/2020    GLUCOSE 118 (H) 03/23/2020   BUN 11 03/23/2020   CREATININE 0.83 03/23/2020   BILITOT 0.5 03/23/2020   ALKPHOS 125 (H) 03/23/2020   AST 12 03/23/2020   ALT 14 03/23/2020   PROT 7.4 03/23/2020   ALBUMIN 4.0 03/23/2020   CALCIUM 9.6 03/23/2020   ANIONGAP 8 12/25/2019   Lab Results  Component Value Date   CHOL  142 11/11/2019   Lab Results  Component Value Date   HDL 32 (L) 11/11/2019   Lab Results  Component Value Date   LDLCALC 94 11/11/2019   Lab Results  Component Value Date   TRIG 84 11/11/2019   Lab Results  Component Value Date   CHOLHDL 4.4 11/11/2019   Lab Results  Component Value Date   HGBA1C 8.6 (H) 03/23/2020      Assessment & Plan:     1. Irregular heart beat - Her EKG was abnormal,- Ambulatory referral to Cardiology  2. Type 2 diabetes mellitus with diabetic polyneuropathy, with long-term current use of insulin (HCC) She will continue on her current treatment regimen, low carb/non concentrated sweet diet and exercise as tolerated. - Will check HgB A1c; Future   Follow-up: Return in about 2 months (around 06/29/2020), or if symptoms worsen or fail to improve.    Johnesha Acheampong Trellis Paganini, NP

## 2020-04-26 NOTE — Patient Instructions (Signed)
Carbohydrate Counting for Diabetes Mellitus, Adult  Carbohydrate counting is a method of keeping track of how many carbohydrates you eat. Eating carbohydrates naturally increases the amount of sugar (glucose) in the blood. Counting how many carbohydrates you eat helps keep your blood glucose within normal limits, which helps you manage your diabetes (diabetes mellitus). It is important to know how many carbohydrates you can safely have in each meal. This is different for every person. A diet and nutrition specialist (registered dietitian) can help you make a meal plan and calculate how many carbohydrates you should have at each meal and snack. Carbohydrates are found in the following foods:  Grains, such as breads and cereals.  Dried beans and soy products.  Starchy vegetables, such as potatoes, peas, and corn.  Fruit and fruit juices.  Milk and yogurt.  Sweets and snack foods, such as cake, cookies, candy, chips, and soft drinks. How do I count carbohydrates? There are two ways to count carbohydrates in food. You can use either of the methods or a combination of both. Reading "Nutrition Facts" on packaged food The "Nutrition Facts" list is included on the labels of almost all packaged foods and beverages in the [place] It includes:  The serving size.  Information about nutrients in each serving, including the grams (g) of carbohydrate per serving. To use the "Nutrition Facts":  Decide how many servings you will have.  Multiply the number of servings by the number of carbohydrates per serving.  The resulting number is the total amount of carbohydrates that you will be having. Learning standard serving sizes of other foods When you eat carbohydrate foods that are not packaged or do not include "Nutrition Facts" on the label, you need to measure the servings in order to count the amount of carbohydrates:  Measure the foods that you will eat with a food scale or measuring cup, if  needed.  Decide how many standard-size servings you will eat.  Multiply the number of servings by 15. Most carbohydrate-rich foods have about 15 g of carbohydrates per serving. ? For example, if you eat 8 oz (170 g) of strawberries, you will have eaten 2 servings and 30 g of carbohydrates (2 servings x 15 g = 30 g).  For foods that have more than one food mixed, such as soups and casseroles, you must count the carbohydrates in each food that is included. The following list contains standard serving sizes of common carbohydrate-rich foods. Each of these servings has about 15 g of carbohydrates:   hamburger bun or  English muffin.   oz (15 mL) syrup.   oz (14 g) jelly.  1 slice of bread.  1 six-inch tortilla.  3 oz (85 g) cooked rice or pasta.  4 oz (113 g) cooked dried beans.  4 oz (113 g) starchy vegetable, such as peas, corn, or potatoes.  4 oz (113 g) hot cereal.  4 oz (113 g) mashed potatoes or  of a large baked potato.  4 oz (113 g) canned or frozen fruit.  4 oz (120 mL) fruit juice.  4-6 crackers.  6 chicken nuggets.  6 oz (170 g) unsweetened dry cereal.  6 oz (170 g) plain fat-free yogurt or yogurt sweetened with artificial sweeteners.  8 oz (240 mL) milk.  8 oz (170 g) fresh fruit or one small piece of fruit.  24 oz (680 g) popped popcorn. Example of carbohydrate counting Sample meal  3 oz (85 g) chicken breast.  6 oz (170 g)   brown rice.  4 oz (113 g) corn.  8 oz (240 mL) milk.  8 oz (170 g) strawberries with sugar-free whipped topping. Carbohydrate calculation 1. Identify the foods that contain carbohydrates: ? Rice. ? Corn. ? Milk. ? Strawberries. 2. Calculate how many servings you have of each food: ? 2 servings rice. ? 1 serving corn. ? 1 serving milk. ? 1 serving strawberries. 3. Multiply each number of servings by 15 g: ? 2 servings rice x 15 g = 30 g. ? 1 serving corn x 15 g = 15 g. ? 1 serving milk x 15 g = 15 g. ? 1  serving strawberries x 15 g = 15 g. 4. Add together all of the amounts to find the total grams of carbohydrates eaten: ? 30 g + 15 g + 15 g + 15 g = 75 g of carbohydrates total. Summary  Carbohydrate counting is a method of keeping track of how many carbohydrates you eat.  Eating carbohydrates naturally increases the amount of sugar (glucose) in the blood.  Counting how many carbohydrates you eat helps keep your blood glucose within normal limits, which helps you manage your diabetes.  A diet and nutrition specialist (registered dietitian) can help you make a meal plan and calculate how many carbohydrates you should have at each meal and snack. This information is not intended to replace advice given to you by your health care provider. Make sure you discuss any questions you have with your health care provider. Document Revised: 11/22/2016 Document Reviewed: 10/12/2015 Elsevier Patient Education  2020 Elsevier Inc.  

## 2020-04-26 NOTE — Progress Notes (Unsigned)
HPI: [name] is a 44 year old thats LHD with left shoulder pain and neck pain. The pain begins in the upper arm and radiates down the arm. It is throbbing in nature.    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 Her RT shoulder has a FROM in all planes. On her left side, FF is to 130 degrees. She has 50 degrees of extension. She has 20 degrees of ADD and 110 degree ABD. She has 90/90 of IR/ER.  She is not TTP. Hawkans' Speed's test cause pain above the deltoid. She is slightly weak but probably because of pain inhibition.   DTRs at the biceps, triceps, brachioradiialis 1+= Active Spurling's causes some trapzius pain  to the left: neg. to the right.  Plan: Will xray both neck and shourder and refer her to physical therapy. 2 view of cervial spine. Xray of left shoulder. Return in 4 weeks.

## 2020-05-03 ENCOUNTER — Other Ambulatory Visit: Payer: Self-pay

## 2020-05-03 ENCOUNTER — Ambulatory Visit: Payer: Self-pay | Admitting: Specialist

## 2020-05-03 ENCOUNTER — Ambulatory Visit: Payer: Self-pay

## 2020-05-03 NOTE — Progress Notes (Unsigned)
°  Subjective:     Patient ID: [name], female   DOB: [birth year], 44 y.o.   MRN: [id number]  HPI   Review of Systems     Objective:   Physical Exam     Assessment:     ***    Plan:     ***Patient did not have X-rays ordered from 04/26/20 visit performed. Printed the orders for patient to take to [facility] imaging to have done. Patient will follow up with our office 06/07/20

## 2020-05-05 ENCOUNTER — Ambulatory Visit
Admission: RE | Admit: 2020-05-05 | Discharge: 2020-05-05 | Disposition: A | Discharge status: Home / Self Care | Payer: Self-pay | Attending: Gerontology | Admitting: Gerontology

## 2020-05-05 ENCOUNTER — Ambulatory Visit
Admission: RE | Admit: 2020-05-05 | Discharge: 2020-05-05 | Disposition: A | Discharge status: Home / Self Care | Payer: Self-pay | Source: Ambulatory Visit | Attending: Gerontology | Admitting: Gerontology

## 2020-05-05 DIAGNOSIS — M25512 Pain in left shoulder: Secondary | ICD-10-CM

## 2020-05-31 ENCOUNTER — Ambulatory Visit: Payer: Self-pay | Admitting: Student

## 2020-05-31 NOTE — Progress Notes (Signed)
I interviewed and discussed the patient with the student at the time of the visit. I agree with the findings and plans documented. Hannah Sabins, MD  Ms. Eley has longstanding diabetes with history of poor control. She has recently improved with increasing insulin. Currently managed on 72U glargine + Trulicity. She has not tolerated metformin in the past. Last A1c 8.6%, but unclear what fasting BG was at the time. Recent FBG ~130-140. PP 200s in the evening.     A1c goal 7-8% of <7% without hypoglycemia. Ideally would target HbA1c <7.5%. Given that we cannot directly increase her Trulicity, we have suggested a slight increase in her Lantus. If repeat A1c continue to be >7.5% at next visit, would increase Trulicity to 3mg . Would also benefit from SGLT2i given albuminuria.     With long-standing poorly controlled diabetes and some description of chest pain, agree with referral to cardiology for consultation. Though chest pain is atypical, she is reasonably high risk for cardiovascular disease and merits at least consultation. GLP1-RA (Trulicity), SGLT2 inhibitors, and metformin are agents that have data in regards to cardiovascular risk. Would recommend these agents as first line for cardiovascular risk reduction, and as they may reduce need for insulin.

## 2020-05-31 NOTE — Progress Notes (Signed)
Follow up Diabetes/ Endocrine [facility]     Patient ID: [name], female   DOB: Oct 30, 1975, 45 y.o.   MRN: [id number] Assessment:  [name] is a 45 y.o. female who presents for T2DM management. She is currently not a goal with treatment, but her HbA1c has been decreasing (HbA1c 8.6 in November).  Encounter Diagnoses No diagnosis found.    Plan:  - Increase insulin glargine to 76 units (from 72) at bedtime. Patient was praised on her efforts to reduce her HbA1c. She was educated and reassured about the high doses of insulin as she had expressed concerns about the association between high insulin doses and blindness. Patient was advised on the potential benefit of adding an SGLT2 inhibitor or pioglitazone while reducing her insulin to bring her at treatment goal (<7.5).  - Follow up in 3 months for routine diabetes care.  - Refer to Ophthalmology for diabetic retinopathy screening.  - Patient was counseled on lifestyle modifications, more specifically about ways to reduce her carbohydrate intake.  - Patient was encouraged to attend her Cardiology appointment given her higher risk of cardiovascular conditions.     There are no Patient Instructions on file for this visit.   No orders of the defined types were placed in this encounter.    Subjective:  Patient is a 45 year-old female with a history of T2DM, HTN, hyperlipidemia, GERD, and nephrolithiasis presenting for DM management. She reports measuring her BG twice daily, with fasting BG 130-140 (lowest in past year: 98), and BG 2 hours after dinner around 220 (less than 260-270).   She manages her DM with: liraglutide 1.8 mg qAM, and insulin glargine 72 units at bedtime. She denies any side effects, recent missed doses, and difficulties affording her medications.  She states that her most pressing concern is the constant throbbing sharp pain in her arms that radiates to her chest and hands, with the pain  in the left arm being the worst. She could not identify any precipitating factors or anything that relieved it (including Acetaminophen). She reports instances of hand numbness in the past but not recently.  She reports feeling thirsty all the time, drinking water frequently, and urinating often. She denies any symptoms of hypoglycemia but reports some "dizzy spells" that she does not think are related to her DM. She states that her glucose levels was 98 during one of those "spells" (She does not measure her BG often during these episodes to save on her strips).  She reports swelling in her feet and a burning sensation in her feet ("outer skin") with her right bid toe throbbing (feeling "like an open wound") when she wears closed-toe shoes. She reports that her last eye exam was over a year ago.  She denies any significant weight change (fluctuates +/- 5-8 lbs over time). She descrbs her diet as follows: informal breakfast consisting of muffin or apple tarts or fresh fruits; lunch with couple of hot dogs, sandwich, or apple sauce; dinner with vegetables and meat and mashed potatoes; no soda but fruit juice and water and tea. She denies doing any exercise as she feels tired all the time and does not walk in the neighborhood because of "too many dogs." She denies smoking, using any recreational drugs or alcohol.      Review of Systems  Endocrine: Positive for polydipsia and polyuria.  Neurological: Positive for numbness.    [name]  has a past medical history of Depression, Diabetes mellitus  without complication (HCC), GERD (gastroesophageal reflux disease), Headache, History of kidney stones, History of nephrolithiasis (2015), Hyperlipidemia, Hypertension (12/2019), Low back pain (12/2019), and Neuromuscular disorder (HCC).  Family History, Social History, current Medications and allergies reviewed and updated in Epic.  Objective:    There were no vitals taken for this  visit. Physical Exam      Data : I have personally reviewed pertinent labs and imaging studies, if indicated,  with the patient in clinic today.   Lab Orders  No laboratory test(s) ordered today    HC Readings from Last 3 Encounters:  No data found for [facility]    Wt Readings from Last 3 Encounters:  04/26/20 281 lb 9.6 oz (127.7 kg)  03/29/20 280 lb 6.4 oz (127.2 kg)  03/08/20 277 lb (125.6 kg)     James Ivanoff, MS3

## 2020-06-07 ENCOUNTER — Ambulatory Visit: Payer: Self-pay | Admitting: Specialist

## 2020-06-14 ENCOUNTER — Ambulatory Visit: Payer: Self-pay | Admitting: Specialist

## 2020-06-15 ENCOUNTER — Emergency Department
Admission: EM | Admit: 2020-06-15 | Discharge: 2020-06-15 | Disposition: A | Discharge status: Home / Self Care | Payer: HRSA Program | Attending: Emergency Medicine | Admitting: Emergency Medicine

## 2020-06-15 ENCOUNTER — Encounter: Payer: Self-pay | Admitting: Emergency Medicine

## 2020-06-15 ENCOUNTER — Other Ambulatory Visit: Payer: Self-pay | Admitting: Emergency Medicine

## 2020-06-15 ENCOUNTER — Other Ambulatory Visit: Payer: Self-pay

## 2020-06-15 ENCOUNTER — Emergency Department: Payer: HRSA Program

## 2020-06-15 DIAGNOSIS — E785 Hyperlipidemia, unspecified: Secondary | ICD-10-CM | POA: Insufficient documentation

## 2020-06-15 DIAGNOSIS — Z794 Long term (current) use of insulin: Secondary | ICD-10-CM | POA: Insufficient documentation

## 2020-06-15 DIAGNOSIS — I10 Essential (primary) hypertension: Secondary | ICD-10-CM | POA: Insufficient documentation

## 2020-06-15 DIAGNOSIS — E1169 Type 2 diabetes mellitus with other specified complication: Secondary | ICD-10-CM | POA: Insufficient documentation

## 2020-06-15 DIAGNOSIS — R0789 Other chest pain: Secondary | ICD-10-CM

## 2020-06-15 DIAGNOSIS — U071 COVID-19: Secondary | ICD-10-CM | POA: Diagnosis not present

## 2020-06-15 DIAGNOSIS — E1165 Type 2 diabetes mellitus with hyperglycemia: Secondary | ICD-10-CM | POA: Diagnosis not present

## 2020-06-15 DIAGNOSIS — R079 Chest pain, unspecified: Secondary | ICD-10-CM | POA: Diagnosis present

## 2020-06-15 DIAGNOSIS — J020 Streptococcal pharyngitis: Secondary | ICD-10-CM | POA: Diagnosis not present

## 2020-06-15 LAB — BASIC METABOLIC PANEL
Anion gap: 14 (ref 5–15)
BUN: 10 mg/dL (ref 6–20)
CO2: 23 mmol/L (ref 22–32)
Calcium: 9 mg/dL (ref 8.9–10.3)
Chloride: 95 mmol/L — ABNORMAL LOW (ref 98–111)
Creatinine, Ser: 0.84 mg/dL (ref 0.44–1.00)
GFR, Estimated: >60 mL/min (ref >60)
Glucose, Bld: 398 mg/dL — ABNORMAL HIGH (ref 70–99)
Potassium: 3.8 mmol/L (ref 3.5–5.1)
Sodium: 132 mmol/L — ABNORMAL LOW (ref 135–145)

## 2020-06-15 LAB — CBC
HCT: 30.7 % — ABNORMAL LOW (ref 36.0–46.0)
Hemoglobin: 9.8 g/dL — ABNORMAL LOW (ref 12.0–15.0)
MCH: 23 pg — ABNORMAL LOW (ref 26.0–34.0)
MCHC: 31.9 g/dL (ref 30.0–36.0)
MCV: 72.1 fL — ABNORMAL LOW (ref 80.0–100.0)
Platelets: 266 10*3/uL (ref 150–400)
RBC: 4.26 MIL/uL (ref 3.87–5.11)
RDW: 14.7 % (ref 11.5–15.5)
WBC: 7.3 10*3/uL (ref 4.0–10.5)
nRBC: 0 % (ref 0.0–0.2)

## 2020-06-15 LAB — POC URINE PREG, ED: Preg Test, Ur: NEGATIVE

## 2020-06-15 LAB — TROPONIN I (HIGH SENSITIVITY)
Troponin I (High Sensitivity): 4 ng/L (ref <18)
Troponin I (High Sensitivity): 6 ng/L (ref <18)

## 2020-06-15 MED ORDER — CEPHALEXIN 500 MG PO CAPS: 500.0000 mg | ORAL_CAPSULE | Freq: Two times a day (BID) | ORAL | 0 refills | Status: DC

## 2020-06-15 MED ORDER — IBUPROFEN 600 MG PO TABS: 600.0000 mg | ORAL_TABLET | Freq: Four times a day (QID) | ORAL | 0 refills | Status: DC | PRN

## 2020-06-15 MED ORDER — SODIUM CHLORIDE 0.9 % IV BOLUS
1000.0000 mL | Freq: Once | INTRAVENOUS | Status: AC
  Administered 2020-06-15: 1000 mL via INTRAVENOUS

## 2020-06-15 MED ORDER — ONDANSETRON HCL 4 MG/2ML IJ SOLN
4.0000 mg | Freq: Once | INTRAMUSCULAR | Status: AC
  Administered 2020-06-15: 4 mg via INTRAVENOUS
  Filled 2020-06-15: qty 2

## 2020-06-15 MED ORDER — KETOROLAC TROMETHAMINE 30 MG/ML IJ SOLN
15.0000 mg | Freq: Once | INTRAMUSCULAR | Status: AC
  Administered 2020-06-15: 15 mg via INTRAVENOUS
  Filled 2020-06-15: qty 1

## 2020-06-15 MED ORDER — ACETAMINOPHEN 500 MG PO TABS
1000.0000 mg | ORAL_TABLET | Freq: Once | ORAL | Status: AC
  Administered 2020-06-15: 1000 mg via ORAL
  Filled 2020-06-15: qty 2

## 2020-06-15 MED ORDER — ONDANSETRON 4 MG PO TBDP: 4.0000 mg | ORAL_TABLET | Freq: Three times a day (TID) | ORAL | 0 refills | Status: DC | PRN

## 2020-06-15 MED ORDER — CEPHALEXIN 500 MG PO CAPS
500.0000 mg | ORAL_CAPSULE | Freq: Once | ORAL | Status: AC
  Administered 2020-06-15: 500 mg via ORAL
  Filled 2020-06-15: qty 1

## 2020-06-15 NOTE — ED Triage Notes (Signed)
Pt to ED via POV with c/o strep throat and "I keep feeling sharp pains in my chest". Pt provided wheelchair at this time.

## 2020-06-15 NOTE — ED Provider Notes (Addendum)
[facility] Emergency Department Provider Note  ____________________________________________   Event Date/Time   First MD Initiated Contact with Patient 06/15/20 1835     (approximate)  I have reviewed the triage vital signs and the nursing notes.   HISTORY  Chief Complaint Chest Pain    HPI [name] is a 45 y.o. female with diabetes, depression, kidney stones, hypertension, hyperlipidemia who comes in with chest pain.  Patient reports not feeling well for about 2 weeks.  States that she has been around COVID people.  Had an at-home Covid test that was negative.  Mostly is concerned about sore throat that is constant, nothing makes it better, nothing makes it worse.  She is not been taking all of her medications for her diabetes secondary to taking over-the-counter flu medicine.  She reports some sharp stabbing chest pain that occurs intermittently.  None at this time.  Denies any shortness of breath abdominal pain or urinary symptoms          Past Medical History:  Diagnosis Date  . Depression   . Diabetes mellitus without complication (HCC)   . GERD (gastroesophageal reflux disease)   . Headache   . History of kidney stones   . History of nephrolithiasis 2015   via CT imaging, including an obstructing stone  . Hyperlipidemia   . Hypertension 12/2019   per patient, she has never been treated for high blood pressure  . Low back pain 12/2019  . Neuromuscular disorder [facility])    neuropathy    Patient Active Problem List   Diagnosis Date Noted  . Left shoulder pain 03/29/2020  . Irregular heart beat 03/29/2020  . Bilateral edema of lower extremity 03/08/2020  . Acute pyelonephritis 12/09/2019  . Hyperglycemia due to type 2 diabetes mellitus (HCC) 12/09/2019  . AKI (acute kidney injury) (HCC) 12/09/2019  . Hydronephrosis with renal and ureteral calculus obstruction 12/09/2019  . Peripheral neuropathy 03/19/2019  . Blurry vision  01/27/2019  . Hyperlipidemia associated with type 2 diabetes mellitus (HCC) 11/13/2018  . Rash 11/13/2018  . Headache 01/16/2018  . Back pain 01/16/2018  . Obesity, Class III, BMI 40-49.9 (morbid obesity) (HCC) 05/01/2016  . Depression 05/01/2016  . Metrorrhagia 11/10/2015  . Diabetes (HCC) 03/10/2015  . Anemia 03/10/2015  . Edema 03/10/2015    Past Surgical History:  Procedure Laterality Date  . CHOLECYSTECTOMY  1998  . CYSTOSCOPY WITH STENT PLACEMENT Right 12/09/2019   Procedure: CYSTOSCOPY WITH STENT PLACEMENT;  Surgeon: Riki Altes, MD;  Location: [facility] ORS;  Service: Urology;  Laterality: Right;  . CYSTOSCOPY/RETROGRADE/URETEROSCOPY  12/29/2019   Procedure: CYSTOSCOPY/RETROGRADE/URETEROSCOPY;  Surgeon: Riki Altes, MD;  Location: [facility] ORS;  Service: Urology;;    Prior to Admission medications   Medication Sig Start Date End Date Taking? Authorizing Provider  acetaminophen (TYLENOL) 500 MG tablet Take 650 mg by mouth as needed (toothache).     [provider]  insulin glargine (LANTUS) 100 UNIT/ML injection Inject 0.72 mLs (72 Units total) into the skin at bedtime. Inject up to 51 units every night, increase by 3 units every 3 days until blood sugar in the morning is between 80-130 mg/dl. 03/09/20 06/07/20  [name], [name], NP  liraglutide (VICTOZA) 18 MG/3ML SOPN Inject 1.8 mg into the skin every morning. 03/29/20   [name], [name], NP  Potassium Citrate 15 MEQ (1620 MG) TBCR 1 tab twice daily with meals 03/03/20   Stoioff, Verna Czech, MD    Allergies Penicillins, Topiramate,  Topiramate er, and Valtrex [valacyclovir]  Family History  Problem Relation Age of Onset  . Kidney failure Father   . Pancreatic cancer Maternal Grandmother     Social History Social History   Tobacco Use  . Smoking status: Never Smoker  . Smokeless tobacco: Never Used  Vaping Use  . Vaping Use: Never used  Substance Use Topics  . Alcohol use: No  . Drug use: No       Review of Systems Constitutional: No fever/chills Eyes: No visual changes. ENT: Positive sore throat Cardiovascular: Positive chest pain Respiratory: Denies shortness of breath. Gastrointestinal: No abdominal pain.  No nausea, no vomiting.  No diarrhea.  No constipation. Genitourinary: Negative for dysuria. Musculoskeletal: Negative for back pain. Skin: Negative for rash. Neurological: Negative for headaches, focal weakness or numbness. All other ROS negative ____________________________________________   PHYSICAL EXAM:  VITAL SIGNS: ED Triage Vitals  Enc Vitals Group     BP 06/15/20 1637 127/76     Pulse Rate 06/15/20 1637 97     Resp 06/15/20 1637 (!) 22     Temp 06/15/20 1637 99.5 F (37.5 C)     Temp Source 06/15/20 1637 Oral     SpO2 06/15/20 1637 96 %     Weight 06/15/20 1631 270 lb (122.5 kg)     Height 06/15/20 1631 5\' 8"  (1.727 m)     Head Circumference --      Peak Flow --      Pain Score 06/15/20 1631 5     Pain Loc --      Pain Edu? --      Excl. in GC? --     Constitutional: Alert and oriented. Well appearing and in no acute distress. Eyes: Conjunctivae are normal. EOMI. Head: Atraumatic. Nose: No congestion/rhinnorhea. Mouth/Throat: Mucous membranes are moist.  OP with bilaterally enlarged tonsils with exudates on them. Neck: No stridor. Trachea Midline. FROM Cardiovascular: Normal rate, regular rhythm. Grossly normal heart sounds.  Good peripheral circulation. Respiratory: Normal respiratory effort.  No retractions. Lungs CTAB. Gastrointestinal: Soft and nontender. No distention. No abdominal bruits.  Musculoskeletal: No lower extremity tenderness nor edema.  No joint effusions. Neurologic:  Normal speech and language. No gross focal neurologic deficits are appreciated.  Skin:  Skin is warm, dry and intact. No rash noted. Psychiatric: Mood and affect are normal. Speech and behavior are normal. GU: Deferred    ____________________________________________   LABS (all labs ordered are listed, but only abnormal results are displayed)  Labs Reviewed  BASIC METABOLIC PANEL - Abnormal; Notable for the following components:      Result Value   Sodium 132 (*)    Chloride 95 (*)    Glucose, Bld 398 (*)    All other components within normal limits  CBC - Abnormal; Notable for the following components:   Hemoglobin 9.8 (*)    HCT 30.7 (*)    MCV 72.1 (*)    MCH 23.0 (*)    All other components within normal limits  POC URINE PREG, ED  TROPONIN I (HIGH SENSITIVITY)  TROPONIN I (HIGH SENSITIVITY)   ____________________________________________   ED ECG REPORT I, Concha Se, the attending physician, personally viewed and interpreted this ECG.  Sinus rhythm rate of 96, no ST elevation but does have occasional PVC, no T wave inversions ____________________________________________  RADIOLOGY Vela Prose, personally viewed and evaluated these images (plain radiographs) as part of my medical decision making, as well as reviewing the written report  by the radiologist.  ED MD interpretation: No focal consolidation  Official radiology report(s): DG Chest 2 View  Result Date: 06/15/2020 CLINICAL DATA:  45 year old female with chest pain.  COVID exposure. EXAM: CHEST - 2 VIEW COMPARISON:  Chest radiograph dated 08/01/2018. FINDINGS: No focal consolidation, pleural effusion or pneumothorax. Faint peribronchial densities may represent reactive small airway disease. Stable cardiac silhouette. No acute osseous pathology. IMPRESSION: No focal consolidation. Electronically Signed   By: Elgie Collard M.D.   On: 06/15/2020 17:31    ____________________________________________   PROCEDURES  Procedure(s) performed (including Critical Care):  Procedures   ____________________________________________   INITIAL IMPRESSION / ASSESSMENT AND PLAN / ED COURSE  [name] was evaluated  in Emergency Department on 06/15/2020 for the symptoms described in the history of present illness. She was evaluated in the context of the global COVID-19 pandemic, which necessitated consideration that the patient might be at risk for infection with the SARS-CoV-2 virus that causes COVID-19. Institutional protocols and algorithms that pertain to the evaluation of patients at risk for COVID-19 are in a state of rapid change based on information released by regulatory bodies including the CDC and federal and state organizations. These policies and algorithms were followed during the patient's care in the ED.    Patient is a 45 year old who comes in with sore throat in the setting of flulike symptoms.  On exam patient has what looks like strep throat given the exudates bilaterally.  No  peritonsillar abscess.  Full range of motion of neck I have low suspicion for retropharyngeal abscess.  Labs ordered to evaluate for Electra abnormalities, AKI.  Low suspicion for ACS but EKG and cardiac markers were ordered to evaluate. Chest x-ray ordered from triage to evaluate for pneumonia.  Sugar is elevated at 398 but no anion gap elevation to suggest DKA.  Patient understands that she needs to restart her insulin when she goes home.  She felt comfortable with this plan. Hemoglobin is 9.8 denies any bleeding history.  Reports a history of anemia.  Understands that she should follow this up with her primary care doctor. Troponin is 4  We will give patient Tylenol, Toradol, a liter of fluid. Holding off on Decadron for her sore throat due to my concern of her sugars being elevated. No evidence of DKA at this time. Encourage patient to restart her diabetes medicine.  Her Covid test is also pending. Discussed quarantine precautions with patient  Patient feeling better after medications and she feels comfortable with being discharged home at this time     ____________________________________________   FINAL CLINICAL  IMPRESSION(S) / ED DIAGNOSES   Final diagnoses:  Strep pharyngitis  Atypical chest pain      MEDICATIONS GIVEN DURING THIS VISIT:  Medications  sodium chloride 0.9 % bolus 1,000 mL (1,000 mLs Intravenous New Bag/Given 06/15/20 1859)  ondansetron (ZOFRAN) injection 4 mg (4 mg Intravenous Given 06/15/20 1859)  ketorolac (TORADOL) 30 MG/ML injection 15 mg (15 mg Intravenous Given 06/15/20 1859)  acetaminophen (TYLENOL) tablet 1,000 mg (1,000 mg Oral Given 06/15/20 1902)  cephALEXin (KEFLEX) capsule 500 mg (500 mg Oral Given 06/15/20 1903)     ED Discharge Orders         Ordered    ondansetron (ZOFRAN ODT) 4 MG disintegrating tablet  Every 8 hours PRN        06/15/20 1913    ibuprofen (ADVIL) 600 MG tablet  Every 6 hours PRN        06/15/20 1913  cephALEXin (KEFLEX) 500 MG capsule  2 times daily        06/15/20 1913           Note:  This document was prepared using Dragon voice recognition software and may include unintentional dictation errors.   Concha Se, MD 06/15/20 1914    Concha Se, MD 06/15/20 Norberta Keens

## 2020-06-15 NOTE — Discharge Instructions (Addendum)
Your covid results are pending. However I do think you have strep throat we have prophylactically treated you with antibiotics. Finish the course of these antibiotics that were prescribed.  Restart your medications for your blood sugar given it was high today  Your hemoglobin was also slightly low and this needs to be followed up with your primary care doctor  Follow up in Mychart for these results.   Stay quarentine until your results are back. Take tylenol 1g every 8 hours And can take  ibuprofen 600mg  every 6-8 hours with food as long as no history of kidney issues or on a blood thinner.  Return to ER for worsening SOB, unable to keep food down, or any other concerns.

## 2020-06-15 NOTE — ED Triage Notes (Signed)
Pt comes into the ED via POV c/o left chest pain that started a few days ago.  Pt states she has been sick for a couple weeks, but her home COVID test said she was negative. Pt has nausea, dizziness, but denies any SHOB.  Pt states she hasnt had much PO intake in a week.  Pt denies any cardiac history.  Pt states the chest pain is shooting up into the left arm.

## 2020-06-16 LAB — SARS CORONAVIRUS 2 (TAT 6-24 HRS): SARS Coronavirus 2: POSITIVE — AB

## 2020-06-22 ENCOUNTER — Ambulatory Visit: Payer: Self-pay | Admitting: Gerontology

## 2020-06-29 ENCOUNTER — Ambulatory Visit: Payer: Self-pay | Admitting: Gerontology

## 2020-06-29 ENCOUNTER — Other Ambulatory Visit: Payer: Self-pay | Admitting: Gerontology

## 2020-06-29 ENCOUNTER — Other Ambulatory Visit: Payer: Self-pay

## 2020-06-29 ENCOUNTER — Encounter: Payer: Self-pay | Admitting: Gerontology

## 2020-06-29 VITALS — BP 121/74 | HR 79 | Temp 97.2°F | Resp 16 | Wt 273.8 lb

## 2020-06-29 DIAGNOSIS — E1165 Type 2 diabetes mellitus with hyperglycemia: Secondary | ICD-10-CM

## 2020-06-29 LAB — POCT GLYCOSYLATED HEMOGLOBIN (HGB A1C)
HbA1c POC (<> result, manual entry): 9.1 % (ref 4.0–5.6)
HbA1c, POC (controlled diabetic range): 0 % (ref 0.0–7.0)
HbA1c, POC (prediabetic range): 0 % — AB (ref 5.7–6.4)
Hemoglobin A1C: 9.1 % — AB (ref 4.0–5.6)

## 2020-06-29 LAB — GLUCOSE, POCT (MANUAL RESULT ENTRY): POC Glucose: 115 mg/dl — AB (ref 70–99)

## 2020-06-29 MED ORDER — INSULIN GLARGINE 100 UNIT/ML ~~LOC~~ SOLN: 76.0000 [IU] | Freq: Every day | SUBCUTANEOUS | 5 refills | Status: DC

## 2020-06-29 NOTE — Progress Notes (Signed)
Established Patient Office Visit  Subjective:  Patient ID: [name], female    DOB: 01/13/1976  Age: 45 y.o. MRN: [id number]  CC: No chief complaint on file.   HPI [name] presents for follow up of type 2 diabetes. She states that she's compliant with her medications and continues to make dietary changes. Her HgbA1c done on 06/29/20 increased from 8.6% to 9.1%. She checks her blood glucose bid and her fasting blood glucose yesterday morning was 230 mg/dl. Her finger stick was 115 mg/dl during clinic visit. She reports that she just resumed taking her Lantus and Victoza 3 days ago and wasn't taking it for 2 weeks, because she was sick with Covid infection. She denies hypoglycemic symptoms, peripheral neuropathy ,performs daily foot checks, but admits to polyuria. She denies chest pain, palpitation, and dizziness. Overall, she states that she's doing well and offers no further complaint.  Past Medical History:  Diagnosis Date  . Depression   . Diabetes mellitus without complication (HCC)   . GERD (gastroesophageal reflux disease)   . Headache   . History of kidney stones   . History of nephrolithiasis 2015   via CT imaging, including an obstructing stone  . Hyperlipidemia   . Hypertension 12/2019   per patient, she has never been treated for high blood pressure  . Low back pain 12/2019  . Neuromuscular disorder (HCC)    neuropathy    Past Surgical History:  Procedure Laterality Date  . CHOLECYSTECTOMY  1998  . CYSTOSCOPY WITH STENT PLACEMENT Right 12/09/2019   Procedure: CYSTOSCOPY WITH STENT PLACEMENT;  Surgeon: Riki Altes, MD;  Location: [facility] ORS;  Service: Urology;  Laterality: Right;  . CYSTOSCOPY/RETROGRADE/URETEROSCOPY  12/29/2019   Procedure: CYSTOSCOPY/RETROGRADE/URETEROSCOPY;  Surgeon: Riki Altes, MD;  Location: [facility] ORS;  Service: Urology;;    Family History  Problem Relation Age of Onset  . Kidney failure Father   . Pancreatic  cancer Maternal Grandmother     Social History   Socioeconomic History  . Marital status: Single    Spouse name: Not on file  . Number of children: Not on file  . Years of education: Not on file  . Highest education level: Not on file  Occupational History  . Occupation: not currently working  Tobacco Use  . Smoking status: Never Smoker  . Smokeless tobacco: Never Used  Vaping Use  . Vaping Use: Never used  Substance and Sexual Activity  . Alcohol use: No  . Drug use: No  . Sexual activity: Never  Other Topics Concern  . Not on file  Social History Narrative   - Needs a blood sugar monitor       Patient said she is living with her mom and things are going ok. Mom provides for her food, transport, etc.       Patient wants to wait until next visit before any information is shared.    Social Determinants of Health   Financial Resource Strain: Not on file  Food Insecurity: Not on file  Transportation Needs: Not on file  Physical Activity: Not on file  Stress: Not on file  Social Connections: Not on file  Intimate Partner Violence: Not on file    Outpatient Medications Prior to Visit  Medication Sig Dispense Refill  . acetaminophen (TYLENOL) 500 MG tablet Take 650 mg by mouth as needed (toothache).     . liraglutide (VICTOZA) 18 MG/3ML SOPN Inject 1.8 mg into the skin every morning. 3 mL  5  . Potassium Citrate 15 MEQ (1620 MG) TBCR 1 tab twice daily with meals 60 tablet 11  . insulin glargine (LANTUS) 100 UNIT/ML injection Inject 0.72 mLs (72 Units total) into the skin at bedtime. Inject up to 51 units every night, increase by 3 units every 3 days until blood sugar in the morning is between 80-130 mg/dl. 10 mL 5  . ondansetron (ZOFRAN ODT) 4 MG disintegrating tablet Take 1 tablet (4 mg total) by mouth every 8 (eight) hours as needed for nausea or vomiting. (Patient not taking: Reported on 06/29/2020) 20 tablet 0   No facility-administered medications prior to visit.     Allergies  Allergen Reactions  . Penicillins Hives  . Topiramate     Other reaction(s): Other (See Comments) Worsened migraines, N/V  . Topiramate Er Nausea And Vomiting    Intensified Migraines   . Valtrex [Valacyclovir] Itching    ROS Review of Systems  Constitutional: Negative.   Eyes: Negative.   Respiratory: Negative.   Cardiovascular: Negative.   Endocrine: Positive for polyuria. Negative for polydipsia and polyphagia.  Skin: Negative.   Neurological: Negative.   Psychiatric/Behavioral: Negative.       Objective:    Physical Exam HENT:     Head: Normocephalic and atraumatic.  Eyes:     Extraocular Movements: Extraocular movements intact.     Conjunctiva/sclera: Conjunctivae normal.     Pupils: Pupils are equal, round, and reactive to light.  Cardiovascular:     Rate and Rhythm: Normal rate and regular rhythm.     Pulses: Normal pulses.     Heart sounds: Normal heart sounds.  Pulmonary:     Effort: Pulmonary effort is normal.     Breath sounds: Normal breath sounds.  Skin:    General: Skin is warm.  Neurological:     General: No focal deficit present.     Mental Status: She is alert and oriented to person, place, and time. Mental status is at baseline.     BP 121/74 (BP Location: Left Arm, Patient Position: Sitting, Cuff Size: Large)   Pulse 79   Temp (!) 97.2 F (36.2 C)   Resp 16   Wt 273 lb 12.8 oz (124.2 kg)   LMP 12/13/2018   SpO2 100%   BMI 41.63 kg/m  Wt Readings from Last 3 Encounters:  06/29/20 273 lb 12.8 oz (124.2 kg)  06/15/20 270 lb (122.5 kg)  04/26/20 281 lb 9.6 oz (127.7 kg)   Encouraged weight loss  Health Maintenance Due  Topic Date Due  . Hepatitis C Screening  Never done  . PNEUMOCOCCAL POLYSACCHARIDE VACCINE AGE 64-64 HIGH RISK  Never done  . COVID-19 Vaccine (1) Never done  . TETANUS/TDAP  Never done  . PAP SMEAR-Modifier  Never done  . OPHTHALMOLOGY EXAM  07/04/2018  . COLONOSCOPY (Pts 45-88yrs Insurance  coverage will need to be confirmed)  Never done  . INFLUENZA VACCINE  Never done    There are no preventive care reminders to display for this patient.  Lab Results  Component Value Date   TSH 2.200 01/16/2018   Lab Results  Component Value Date   WBC 7.3 06/15/2020   HGB 9.8 (L) 06/15/2020   HCT 30.7 (L) 06/15/2020   MCV 72.1 (L) 06/15/2020   PLT 266 06/15/2020   Lab Results  Component Value Date   NA 132 (L) 06/15/2020   K 3.8 06/15/2020   CO2 23 06/15/2020   GLUCOSE 398 (H) 06/15/2020  BUN 10 06/15/2020   CREATININE 0.84 06/15/2020   BILITOT 0.5 03/23/2020   ALKPHOS 125 (H) 03/23/2020   AST 12 03/23/2020   ALT 14 03/23/2020   PROT 7.4 03/23/2020   ALBUMIN 4.0 03/23/2020   CALCIUM 9.0 06/15/2020   ANIONGAP 14 06/15/2020   Lab Results  Component Value Date   CHOL 142 11/11/2019   Lab Results  Component Value Date   HDL 32 (L) 11/11/2019   Lab Results  Component Value Date   LDLCALC 94 11/11/2019   Lab Results  Component Value Date   TRIG 84 11/11/2019   Lab Results  Component Value Date   CHOLHDL 4.4 11/11/2019   Lab Results  Component Value Date   HGBA1C 9.1 (A) 06/29/2020   HGBA1C 9.1 06/29/2020   HGBA1C 0 (A) 06/29/2020   HGBA1C 0.0 06/29/2020      Assessment & Plan:    1. Type 2 diabetes mellitus with hyperglycemia, with long-term current use of insulin (HCC) - Her HgbA1c was 9.1%, her goal should be less than 7%. She will continue on current treatment regimen, low carb/non concentrated sweet diet and exercise as tolerated. She will check her blood glucose bid, record and bring log to follow up appointment. - insulin glargine (LANTUS) 100 UNIT/ML injection; Inject 0.76 mLs (76 Units total) into the skin at bedtime. .  Dispense: 10 mL; Refill: 5 - POCT HgB A1C; Future - POCT Glucose (CBG); Future - Urine Microalbumin w/creat. ratio; Future - POCT HgB A1C - POCT Glucose (CBG) - Urine Microalbumin w/creat. ratio - Ambulatory referral to  Ophthalmology for Diabetic eye exam.    Follow-up: Return in about 29 days (around 07/28/2020), or if symptoms worsen or fail to improve.    Zamar Odwyer Trellis Paganini, NP

## 2020-06-29 NOTE — Patient Instructions (Signed)

## 2020-06-30 ENCOUNTER — Telehealth: Payer: Self-pay | Admitting: Pharmacist

## 2020-06-30 LAB — MICROALBUMIN / CREATININE URINE RATIO
Creatinine, Urine: 143 mg/dL
Microalb/Creat Ratio: 42 mg/g creat — ABNORMAL HIGH (ref 0–29)
Microalbumin, Urine: 60 ug/mL

## 2020-06-30 NOTE — Telephone Encounter (Signed)
06/30/2020 10:04:57 AM - Lantus Solostar Dose change to provider  -- Rhetta Mura - Thursday, June 30, 2020 10:02 AM --I have received a pharmacy printout for Lantus Solostar Inject 76 units into the skin daily at bedtime # 5 boxes--Printed [employer] refill request and sending to [facility]. to sign Noted on request DOSE CHANGE AND PREVIOUSLY ON VIALS NOW SOLOSTAR. PATIENT ENROLLMENT GOOD WITH [employer] TILL 11/02/20.

## 2020-07-14 ENCOUNTER — Telehealth: Payer: Self-pay | Admitting: Pharmacist

## 2020-07-14 NOTE — Telephone Encounter (Signed)
07/14/2020 10:17:35 AM - Lantus Solostar refill faxed to [facility]  -- Rhetta Mura - Thursday, July 14, 2020 10:16 AM -- Arneta Cliche [employer] refill request for Lantus Solostar Inject 76 units into the skin daily at bedtime # 5 boxes  (this is a Dose change and now on Solostar-previously on vials.)

## 2020-07-25 ENCOUNTER — Other Ambulatory Visit: Payer: Self-pay | Admitting: Pharmacist

## 2020-07-28 ENCOUNTER — Ambulatory Visit: Payer: Self-pay | Admitting: Gerontology

## 2020-07-28 ENCOUNTER — Other Ambulatory Visit: Payer: Self-pay

## 2020-07-28 ENCOUNTER — Ambulatory Visit: Payer: Self-pay | Admitting: Pharmacist

## 2020-07-28 DIAGNOSIS — Z79899 Other long term (current) drug therapy: Secondary | ICD-10-CM

## 2020-07-28 NOTE — Progress Notes (Signed)
[facility] Visit Note  Patient: [name] MRN: [id number] Date of Birth: 09-17-1975 PCP: Rolm Gala, NP   [name] 45 y.o. female, contacted today via telephone for her annual medication therapy management review. She was identified by name and date of birth.  LMP 12/13/2018   Patient Information   Past Medical History:  Diagnosis Date  . Depression   . Diabetes mellitus without complication (HCC)   . GERD (gastroesophageal reflux disease)   . Headache   . History of kidney stones   . History of nephrolithiasis 2015   via CT imaging, including an obstructing stone  . Hyperlipidemia   . Hypertension 12/2019   per patient, she has never been treated for high blood pressure  . Low back pain 12/2019  . Neuromuscular disorder (HCC)    neuropathy      Past Surgical History:  Procedure Laterality Date  . CHOLECYSTECTOMY  1998  . CYSTOSCOPY WITH STENT PLACEMENT Right 12/09/2019   Procedure: CYSTOSCOPY WITH STENT PLACEMENT;  Surgeon: Riki Altes, MD;  Location: [facility];  Service: Urology;  Laterality: Right;  . CYSTOSCOPY/RETROGRADE/URETEROSCOPY  12/29/2019   Procedure: CYSTOSCOPY/RETROGRADE/URETEROSCOPY;  Surgeon: Riki Altes, MD;  Location: [facility];  Service: Urology;;     Family History  Problem Relation Age of Onset  . Kidney failure Father   . Pancreatic cancer Maternal Grandmother     New Diagnoses (since last visit):   Family Support: Good  Lifestyle Diet: Eats 3 meals per day. Eating more vegetables and protein.   Current Exercise Habits: The patient does not participate in regular exercise at present       Social History   Substance and Sexual Activity  Alcohol Use No      Social History   Tobacco Use  Smoking Status Never Smoker  Smokeless Tobacco Never Used      Health Maintenance  Topic Date Due  . Hepatitis C Screening  Never done  . PNEUMOCOCCAL POLYSACCHARIDE VACCINE AGE  40-64 HIGH RISK  Never done  . COVID-19 Vaccine (1) Never done  . TETANUS/TDAP  Never done  . PAP SMEAR-Modifier  Never done  . OPHTHALMOLOGY EXAM  07/04/2018  . COLONOSCOPY (Pts 45-56yrs Insurance coverage will need to be confirmed)  Never done  . INFLUENZA VACCINE  Never done  . FOOT EXAM  08/18/2020  . HEMOGLOBIN A1C  12/27/2020  . URINE MICROALBUMIN  06/29/2021  . HIV Screening  Completed  . HPV VACCINES  Aged Out    Health Maintenance/Date Completed  Last ED visit: 06/15/20 Last Visit to PCP: 06/29/20 Next Visit to PCP: 08/09/20 Mammogram: ? 2021 Flu Vaccine: no Pneumonia Vaccine: no COVID-19 Vaccine: no  Outpatient Encounter Medications as of 07/28/2020  Medication Sig  . insulin glargine (LANTUS) 100 UNIT/ML injection Inject 0.76 mLs (76 Units total) into the skin at bedtime. .  . liraglutide (VICTOZA) 18 MG/3ML SOPN Inject 1.8 mg into the skin every morning.  Marland Kitchen OVER THE COUNTER MEDICATION Take 1 capsule by mouth daily. [facility] Gallbladder Care  . Potassium Citrate 15 MEQ (1620 MG) TBCR 1 tab twice daily with meals  . [DISCONTINUED] acetaminophen (TYLENOL) 500 MG tablet Take 650 mg by mouth as needed (toothache).    No facility-administered encounter medications on file as of 07/28/2020.    Assessment and Plan:  Adherence/Access: Takes medication as prescribed each day. [facility] is providing access to Lantus, Victoza and Potassium Citrate.  Diabetes: Currently using Lantus and  Victoza. Blood sugar ranges between 115-230. Denies low blood sugars. Last A1c was 9.1% on 06/29/20 (up from 8.6%).  Lipid Panel (11/11/19): TC = 142 mg/dl; TG = 84 mg/dl; HDL = 32 mg/dl; LDL = 94 mg/dl  Pain: Describes pain in her lower back and a solid heavy feeling in her abdomen. Denies constipation or diarrhea. Patient to discuss with PCP at visit on 08/09/20.  Kidney Stones: History of kidney stones. Right ureteral stent placement in 11/2019. Denies blood in urine,  pain or prolems voiding. Currently on potassium citrate.  Overall Health:  Noticing more issues with her memory in the last few months; patient to discuss at next PCP visit on 08/09/20. Continues to have Issues with sleepiness and lack of energy. Encouraged patient to start walking/exercising.   RTC 1 year  [name] K. Joelene Millin, PharmD [facility]-Pharmacy Operations Coordinator [phone]

## 2020-08-09 ENCOUNTER — Other Ambulatory Visit: Payer: Self-pay

## 2020-08-09 ENCOUNTER — Emergency Department
Admission: EM | Admit: 2020-08-09 | Discharge: 2020-08-09 | Disposition: A | Discharge status: Home / Self Care | Payer: Self-pay | Attending: Emergency Medicine | Admitting: Emergency Medicine

## 2020-08-09 ENCOUNTER — Emergency Department: Payer: Self-pay

## 2020-08-09 ENCOUNTER — Encounter: Payer: Self-pay | Admitting: Emergency Medicine

## 2020-08-09 ENCOUNTER — Ambulatory Visit: Payer: Self-pay | Admitting: Gerontology

## 2020-08-09 ENCOUNTER — Encounter: Payer: Self-pay | Admitting: Gerontology

## 2020-08-09 VITALS — BP 169/68 | HR 47 | Resp 16 | Wt 281.0 lb

## 2020-08-09 DIAGNOSIS — R030 Elevated blood-pressure reading, without diagnosis of hypertension: Secondary | ICD-10-CM | POA: Insufficient documentation

## 2020-08-09 DIAGNOSIS — Z794 Long term (current) use of insulin: Secondary | ICD-10-CM | POA: Insufficient documentation

## 2020-08-09 DIAGNOSIS — M549 Dorsalgia, unspecified: Secondary | ICD-10-CM | POA: Insufficient documentation

## 2020-08-09 DIAGNOSIS — E1142 Type 2 diabetes mellitus with diabetic polyneuropathy: Secondary | ICD-10-CM | POA: Insufficient documentation

## 2020-08-09 DIAGNOSIS — R1030 Lower abdominal pain, unspecified: Secondary | ICD-10-CM | POA: Insufficient documentation

## 2020-08-09 DIAGNOSIS — R001 Bradycardia, unspecified: Secondary | ICD-10-CM | POA: Insufficient documentation

## 2020-08-09 DIAGNOSIS — I10 Essential (primary) hypertension: Secondary | ICD-10-CM | POA: Insufficient documentation

## 2020-08-09 DIAGNOSIS — M545 Low back pain, unspecified: Secondary | ICD-10-CM

## 2020-08-09 DIAGNOSIS — M5416 Radiculopathy, lumbar region: Secondary | ICD-10-CM | POA: Insufficient documentation

## 2020-08-09 DIAGNOSIS — G8929 Other chronic pain: Secondary | ICD-10-CM | POA: Insufficient documentation

## 2020-08-09 LAB — BASIC METABOLIC PANEL
Anion gap: 8 (ref 5–15)
BUN: 15 mg/dL (ref 6–20)
CO2: 26 mmol/L (ref 22–32)
Calcium: 9.6 mg/dL (ref 8.9–10.3)
Chloride: 104 mmol/L (ref 98–111)
Creatinine, Ser: 0.7 mg/dL (ref 0.44–1.00)
GFR, Estimated: >60 mL/min (ref >60)
Glucose, Bld: 181 mg/dL — ABNORMAL HIGH (ref 70–99)
Potassium: 3.8 mmol/L (ref 3.5–5.1)
Sodium: 138 mmol/L (ref 135–145)

## 2020-08-09 LAB — CBC
HCT: 35.6 % — ABNORMAL LOW (ref 36.0–46.0)
Hemoglobin: 11.6 g/dL — ABNORMAL LOW (ref 12.0–15.0)
MCH: 23.3 pg — ABNORMAL LOW (ref 26.0–34.0)
MCHC: 32.6 g/dL (ref 30.0–36.0)
MCV: 71.5 fL — ABNORMAL LOW (ref 80.0–100.0)
Platelets: 371 10*3/uL (ref 150–400)
RBC: 4.98 MIL/uL (ref 3.87–5.11)
RDW: 15.5 % (ref 11.5–15.5)
WBC: 8.3 10*3/uL (ref 4.0–10.5)
nRBC: 0 % (ref 0.0–0.2)

## 2020-08-09 LAB — TROPONIN I (HIGH SENSITIVITY): Troponin I (High Sensitivity): 5 ng/L (ref <18)

## 2020-08-09 LAB — MAGNESIUM: Magnesium: 1.9 mg/dL (ref 1.7–2.4)

## 2020-08-09 NOTE — Progress Notes (Signed)
Established Patient Office Visit  Subjective:  Patient ID: [name], female    DOB: 06/13/1975  Age: 45 y.o. MRN: [id number]  CC: No chief complaint on file.   HPI [name] is a 45 year old female who presents for follow up of DM. She was last seen in February and was noted to have an increased Hgb A1c from 8.6% to 9.1%. At the time, she reported she had not been taking her Lantus and Victoza due to an illness. She restarted these and she has been compliant since. She admits to making healthy choices but does admit she drinks one 8 oz cup of cranberry or cran-grape juice a day. She has been trying to increase her water intake. She does check her BS at home and reports readings between 120's-240's. She denies any lightheadedness or dizziness. She denies any polyuria, polyphagia, or polydipsia. She does admit intermittent tingling in her hands but denies any numbness/tingling to her feet. She performs daily self foot exams. Her FSBG today was 174 mg/dL.   She does admit having back pain, especially upon standing. This began a couple months ago but she does not note a traumatic event/injury. She denies saddle anesthesia, urinary or bowel incontinence. She denies numbness or tingling to her lower extremities but does report tingling in her hands. She describes it as pressure and aching. The pain is intermittent. It is worse after sitting for long periods of time. She rates is as moderate in intensity. She has been seen by Dr. Justice RocherFossier in clinic for cervical pain and the tingling in her hands. X rays were ordered but never completed by patient. She was referred to neurology as well for further evaluation.   She also reports chest pain that began months ago. It is intermittent in nature. She describes it as an intermittent, shooting pain that came on rapidly and resolved spontaneously within minutes. She denies any palpitations or dyspnea upon exertion. There was no correlation to  activity, per the patient. She did have an EKG in February when she was seen in the ED for unrelated event. The EKG showed sinus rhythm with no ST elevation or T wave inversions but did have occasional PVCs. She reports they were going to refer her to cardiology but she never received any information on this. She is not currently experience any chest pain and reports the last time was earlier last month.  Past Medical History:  Diagnosis Date  . Depression   . Diabetes mellitus without complication (HCC)   . GERD (gastroesophageal reflux disease)   . Headache   . History of kidney stones   . History of nephrolithiasis 2015   via CT imaging, including an obstructing stone  . Hyperlipidemia   . Hypertension 12/2019   per patient, she has never been treated for high blood pressure  . Low back pain 12/2019  . Neuromuscular disorder (HCC)    neuropathy    Past Surgical History:  Procedure Laterality Date  . CHOLECYSTECTOMY  1998  . CYSTOSCOPY WITH STENT PLACEMENT Right 12/09/2019   Procedure: CYSTOSCOPY WITH STENT PLACEMENT;  Surgeon: Riki AltesStoioff, Scott C, MD;  Location: [facility];  Service: Urology;  Laterality: Right;  . CYSTOSCOPY/RETROGRADE/URETEROSCOPY  12/29/2019   Procedure: CYSTOSCOPY/RETROGRADE/URETEROSCOPY;  Surgeon: Riki AltesStoioff, Scott C, MD;  Location: [facility];  Service: Urology;;    Family History  Problem Relation Age of Onset  . Kidney failure Father   . Pancreatic cancer Maternal Grandmother     Social History  Socioeconomic History  . Marital status: Single    Spouse name: Not on file  . Number of children: Not on file  . Years of education: Not on file  . Highest education level: Not on file  Occupational History  . Occupation: not currently working  Tobacco Use  . Smoking status: Never Smoker  . Smokeless tobacco: Never Used  Vaping Use  . Vaping Use: Never used  Substance and Sexual Activity  . Alcohol use: No  . Drug use: No  . Sexual activity: Never  Other  Topics Concern  . Not on file  Social History Narrative   - Needs a blood sugar monitor       Patient said she is living with her mom and things are going ok. Mom provides for her food, transport, etc.       Patient wants to wait until next visit before any information is shared.    Social Determinants of Health   Financial Resource Strain: Not on file  Food Insecurity: Not on file  Transportation Needs: Not on file  Physical Activity: Not on file  Stress: Not on file  Social Connections: Not on file  Intimate Partner Violence: Not on file    Outpatient Medications Prior to Visit  Medication Sig Dispense Refill  . insulin glargine (LANTUS) 100 UNIT/ML injection Inject 0.76 mLs (76 Units total) into the skin at bedtime. . 10 mL 5  . liraglutide (VICTOZA) 18 MG/3ML SOPN Inject 1.8 mg into the skin every morning. 3 mL 5  . OVER THE COUNTER MEDICATION Take 1 capsule by mouth daily. [facility] Gallbladder Care    . Potassium Citrate 15 MEQ (1620 MG) TBCR 1 tab twice daily with meals 60 tablet 11   No facility-administered medications prior to visit.    Allergies  Allergen Reactions  . Penicillins Hives  . Topiramate     Other reaction(s): Other (See Comments) Worsened migraines, N/V  . Topiramate Er Nausea And Vomiting    Intensified Migraines   . Valtrex [Valacyclovir] Itching    ROS Review of Systems  Constitutional: Positive for fatigue. Negative for chills and fever.  HENT: Negative.   Respiratory: Negative.   Cardiovascular: Negative for chest pain (reports intermittent, shooting CP; no current chest pain), palpitations and leg swelling.  Gastrointestinal: Negative for abdominal pain and blood in stool.  Endocrine: Negative.   Genitourinary: Negative.   Musculoskeletal: Positive for back pain. Negative for arthralgias, gait problem and myalgias.  Skin: Negative.   Neurological: Negative.   Psychiatric/Behavioral: Negative.       Objective:    Physical  Exam Constitutional:      Appearance: Normal appearance. She is obese.  HENT:     Head: Normocephalic.     Nose: Nose normal.     Mouth/Throat:     Mouth: Mucous membranes are moist.     Pharynx: Oropharynx is clear.  Eyes:     Extraocular Movements: Extraocular movements intact.     Conjunctiva/sclera: Conjunctivae normal.     Pupils: Pupils are equal, round, and reactive to light.  Cardiovascular:     Rate and Rhythm: Bradycardia present.     Pulses: Normal pulses.     Heart sounds: Normal heart sounds.     Comments: Apical HR 42 Pulmonary:     Effort: Pulmonary effort is normal.     Breath sounds: Normal breath sounds.  Abdominal:     General: Bowel sounds are normal.     Palpations: Abdomen  is soft.  Musculoskeletal:     Right lower leg: Edema present.     Left lower leg: Edema present.  Skin:    General: Skin is warm and dry.     Capillary Refill: Capillary refill takes less than 2 seconds.  Neurological:     General: No focal deficit present.     Mental Status: She is alert and oriented to person, place, and time. Mental status is at baseline.     Motor: No weakness.     Coordination: Coordination normal.     Gait: Gait normal.  Psychiatric:        Mood and Affect: Mood normal.        Behavior: Behavior normal.     LMP 12/13/2018  Wt Readings from Last 3 Encounters:  06/29/20 273 lb 12.8 oz (124.2 kg)  06/15/20 270 lb (122.5 kg)  04/26/20 281 lb 9.6 oz (127.7 kg)     Health Maintenance Due  Topic Date Due  . Hepatitis C Screening  Never done  . PNEUMOCOCCAL POLYSACCHARIDE VACCINE AGE 28-64 HIGH RISK  Never done  . COVID-19 Vaccine (1) Never done  . TETANUS/TDAP  Never done  . PAP SMEAR-Modifier  Never done  . OPHTHALMOLOGY EXAM  07/04/2018  . COLONOSCOPY (Pts 45-62yrs Insurance coverage will need to be confirmed)  Never done  . INFLUENZA VACCINE  Never done    There are no preventive care reminders to display for this patient.  Lab Results   Component Value Date   TSH 2.200 01/16/2018   Lab Results  Component Value Date   WBC 7.3 06/15/2020   HGB 9.8 (L) 06/15/2020   HCT 30.7 (L) 06/15/2020   MCV 72.1 (L) 06/15/2020   PLT 266 06/15/2020   Lab Results  Component Value Date   NA 132 (L) 06/15/2020   K 3.8 06/15/2020   CO2 23 06/15/2020   GLUCOSE 398 (H) 06/15/2020   BUN 10 06/15/2020   CREATININE 0.84 06/15/2020   BILITOT 0.5 03/23/2020   ALKPHOS 125 (H) 03/23/2020   AST 12 03/23/2020   ALT 14 03/23/2020   PROT 7.4 03/23/2020   ALBUMIN 4.0 03/23/2020   CALCIUM 9.0 06/15/2020   ANIONGAP 14 06/15/2020   Lab Results  Component Value Date   CHOL 142 11/11/2019   Lab Results  Component Value Date   HDL 32 (L) 11/11/2019   Lab Results  Component Value Date   LDLCALC 94 11/11/2019   Lab Results  Component Value Date   TRIG 84 11/11/2019   Lab Results  Component Value Date   CHOLHDL 4.4 11/11/2019   Lab Results  Component Value Date   HGBA1C 9.1 (A) 06/29/2020   HGBA1C 9.1 06/29/2020   HGBA1C 0 (A) 06/29/2020   HGBA1C 0.0 06/29/2020      Assessment & Plan:   1. Bradycardia New onset of bradycardia. Apical pulse 42 bpm. No lightheadedness, dizziness, vision changes, or hypotension. Pt denies chest pain or SOB. Previous history of irregularity with PVCs on EKG; however, noted to have normal rate. Pt is also hypertensive today, complaining of back pain, and has a history of intermittent chest pain, which she never followed up with cardiology for. Due to acute etiology, patient sent to ED for further evaluation and workup.   2. Type 2 diabetes mellitus with diabetic polyneuropathy, with long-term current use of insulin (HCC) BS is still uncontrolled at home, ranging from 120's-240's. Today, her FSBG was 174 mg/dL. Advised to continue checking at home  and bring log to next visit. Refrain from drinking sugary beverages and switch to diet alternatives for juice. Increase water intake. Will recheck FSBG and  evaluate logs at follow up visit. Continue current regimen for now.   3. Peripheral polyneuropathy Ongoing numbness and tingling to extremities. Follow up with x rays as ordered by Dr. Justice Rocher and neurology.   4. Elevated blood pressure reading SBP elevated to 160's upon initial reading and recheck. Pt denies any headaches or leg swelling at home. She denies any current pain or anxiety. She does have +1 pitting edema of the ankles and non-pitting edema to bilateral feet today. Further evaluation at the ED.   5. Acute low back pain without sciatica, unspecified back pain laterality Ongoing, intermittent aching pressure/pain to low and mid back. No saddle anesthesia or bowel/bladder incontinence. Not currently experiencing pain and no focal deficits noted upon exam. Will refer back to ortho. Continue PRN ibuprofen and notify if any hematochezia occurs.   Follow-up: Return in 2 weeks (08/23/20), or if symptoms do not improve or worsen.    Noemi Chapel, RN, BSN, FNP-S

## 2020-08-09 NOTE — ED Triage Notes (Signed)
Pt to ED via POV with c/o hypertension. Pt states sent by [facility] due to HR being "really low" and BP being "really high". Pt also c/o lower back back and tightness to lower abd with long periods sitting and then standing up. Pt states she does not currently take any medications for BP.

## 2020-08-09 NOTE — ED Provider Notes (Signed)
[facility] Emergency Department Provider Note   ____________________________________________   Event Date/Time   First MD Initiated Contact with Patient 08/09/20 1646     (approximate)  I have reviewed the triage vital signs and the nursing notes.   HISTORY  Chief Complaint Hypertension    HPI [name] is a 45 y.o. female with stated past medical history of type 2 diabetes, hypertension, and chronic low back pain who presents from urgent care due to low heart rate and increased blood pressure.  Patient has no complaints at this time related to her blood pressure or hypertension.  Patient is concerned about some low back pain that has, over the past 2 weeks, begin to "wrap around" to the front of her abdomen.  Patient states that this pain occurs whenever she is sitting for prolonged period of time and stands up.  Patient states that she was told she has herniated lumbar disks and has had symptoms similar to this in the past but never this severe.  Patient currently denies any vision changes, tinnitus, difficulty speaking, facial droop, sore throat, chest pain, shortness of breath, nausea/vomiting/diarrhea, dysuria, or weakness/numbness/paresthesias in any extremity         Past Medical History:  Diagnosis Date  . Depression   . Diabetes mellitus without complication (HCC)   . GERD (gastroesophageal reflux disease)   . Headache   . History of kidney stones   . History of nephrolithiasis 2015   via CT imaging, including an obstructing stone  . Hyperlipidemia   . Hypertension 12/2019   per patient, she has never been treated for high blood pressure  . Low back pain 12/2019  . Neuromuscular disorder [facility])    neuropathy    Patient Active Problem List   Diagnosis Date Noted  . Bradycardia 08/09/2020  . Elevated blood pressure reading 08/09/2020  . Back pain 08/09/2020  . Left shoulder pain 03/29/2020  . Irregular heart beat 03/29/2020   . Bilateral edema of lower extremity 03/08/2020  . Acute pyelonephritis 12/09/2019  . Hyperglycemia due to type 2 diabetes mellitus (HCC) 12/09/2019  . AKI (acute kidney injury) (HCC) 12/09/2019  . Hydronephrosis with renal and ureteral calculus obstruction 12/09/2019  . Peripheral neuropathy 03/19/2019  . Blurry vision 01/27/2019  . Hyperlipidemia associated with type 2 diabetes mellitus (HCC) 11/13/2018  . Rash 11/13/2018  . Headache 01/16/2018  . Back pain 01/16/2018  . Obesity, Class III, BMI 40-49.9 (morbid obesity) (HCC) 05/01/2016  . Depression 05/01/2016  . Metrorrhagia 11/10/2015  . Diabetes (HCC) 03/10/2015  . Anemia 03/10/2015  . Edema 03/10/2015    Past Surgical History:  Procedure Laterality Date  . CHOLECYSTECTOMY  1998  . CYSTOSCOPY WITH STENT PLACEMENT Right 12/09/2019   Procedure: CYSTOSCOPY WITH STENT PLACEMENT;  Surgeon: Riki Altes, MD;  Location: [facility] ORS;  Service: Urology;  Laterality: Right;  . CYSTOSCOPY/RETROGRADE/URETEROSCOPY  12/29/2019   Procedure: CYSTOSCOPY/RETROGRADE/URETEROSCOPY;  Surgeon: Riki Altes, MD;  Location: [facility] ORS;  Service: Urology;;    Prior to Admission medications   Medication Sig Start Date End Date Taking? Authorizing Provider  ibuprofen (ADVIL) 600 MG tablet Take 600 mg by mouth every 6 (six) hours as needed for mild pain or moderate pain.    [provider]  insulin glargine (LANTUS) 100 UNIT/ML injection Inject 0.76 mLs (76 Units total) into the skin at bedtime. . 06/29/20 09/27/20  [name], [name], NP  liraglutide (VICTOZA) 18 MG/3ML SOPN Inject 1.8 mg into the skin  every morning. 03/29/20   [name], [name], NP  OVER THE COUNTER MEDICATION Take 1 capsule by mouth daily. [facility]    [provider]  Potassium Citrate 15 MEQ (1620 MG) TBCR 1 tab twice daily with meals 03/03/20   Stoioff, Verna Czech, MD    Allergies Penicillins, Topiramate, Topiramate er, and Valtrex  [valacyclovir]  Family History  Problem Relation Age of Onset  . Kidney failure Father   . Pancreatic cancer Maternal Grandmother     Social History Social History   Tobacco Use  . Smoking status: Never Smoker  . Smokeless tobacco: Never Used  Vaping Use  . Vaping Use: Never used  Substance Use Topics  . Alcohol use: No  . Drug use: No    Review of Systems Constitutional: No fever/chills Eyes: No visual changes. ENT: No sore throat. Cardiovascular: Denies chest pain. Respiratory: Denies shortness of breath. Gastrointestinal: Endorses abdominal pain.  No nausea, no vomiting.  No diarrhea. Genitourinary: Negative for dysuria. Musculoskeletal: Negative for acute arthralgias Skin: Negative for rash. Neurological: Negative for headaches, weakness/numbness/paresthesias in any extremity Psychiatric: Negative for suicidal ideation/homicidal ideation   ____________________________________________   PHYSICAL EXAM:  VITAL SIGNS: ED Triage Vitals  Enc Vitals Group     BP 08/09/20 1531 (!) 170/50     Pulse Rate 08/09/20 1531 (!) 49     Resp 08/09/20 1531 18     Temp 08/09/20 1531 98.5 F (36.9 C)     Temp Source 08/09/20 1531 Oral     SpO2 08/09/20 1531 98 %     Weight 08/09/20 1530 281 lb (127.5 kg)     Height 08/09/20 1530 5\' 8"  (1.727 m)     Head Circumference --      Peak Flow --      Pain Score 08/09/20 1530 0     Pain Loc --      Pain Edu? --      Excl. in GC? --    Constitutional: Alert and oriented. Well appearing and in no acute distress. Eyes: Conjunctivae are normal. PERRL. Head: Atraumatic. Nose: No congestion/rhinnorhea. Mouth/Throat: Mucous membranes are moist. Neck: No stridor Cardiovascular: Grossly normal heart sounds.  Good peripheral circulation. Respiratory: Normal respiratory effort.  No retractions. Gastrointestinal: Soft and nontender. No distention. Musculoskeletal: No obvious deformities.  Bilateral lower lumbar paraspinal musculature  tenderness to palpation Neurologic:  Normal speech and language. No gross focal neurologic deficits are appreciated. Skin:  Skin is warm and dry. No rash noted. Psychiatric: Mood and affect are normal. Speech and behavior are normal.  ____________________________________________   LABS (all labs ordered are listed, but only abnormal results are displayed)  Labs Reviewed  BASIC METABOLIC PANEL - Abnormal; Notable for the following components:      Result Value   Glucose, Bld 181 (*)    All other components within normal limits  CBC - Abnormal; Notable for the following components:   Hemoglobin 11.6 (*)    HCT 35.6 (*)    MCV 71.5 (*)    MCH 23.3 (*)    All other components within normal limits  MAGNESIUM  POC URINE PREG, ED  TROPONIN I (HIGH SENSITIVITY)  TROPONIN I (HIGH SENSITIVITY)   ____________________________________________  EKG  ED ECG REPORT I, 08/11/20, the attending physician, personally viewed and interpreted this ECG.  Date: 08/09/2020 EKG Time: 1533 Rate: 92 Rhythm: normal sinus rhythm QRS Axis: normal Intervals: normal ST/T Wave abnormalities: normal Narrative Interpretation: no evidence of  acute ischemia.  Multiple PVCs in bigeminy and trigeminy pattern  ____________________________________________  RADIOLOGY  ED MD interpretation: CT without contrast of the abdomen and pelvis shows bilateral nonobstructing kidney stones  2 view chest x-ray shows no evidence of acute abnormalities including no pneumonia, pneumothorax, or widened mediastinum  Official radiology report(s): CT Abdomen Pelvis Wo Contrast  Result Date: 08/09/2020 CLINICAL DATA:  Low back pain EXAM: CT ABDOMEN AND PELVIS WITHOUT CONTRAST TECHNIQUE: Multidetector CT imaging of the abdomen and pelvis was performed following the standard protocol without IV contrast. COMPARISON:  CT 12/09/2019 FINDINGS: Lower chest: No acute abnormality. Hepatobiliary: No focal liver abnormality is  seen. Status post cholecystectomy. No biliary dilatation. Pancreas: Unremarkable. No pancreatic ductal dilatation or surrounding inflammatory changes. Spleen: Normal in size without focal abnormality. Adrenals/Urinary Tract: Adrenal glands are normal. Kidneys show no hydronephrosis. Bilateral nonobstructing kidney stones. Lower pole stone on the left measures 3 mm. Two adjacent stones lower pole right kidney, the larger measures 6 mm. No ureteral stone. The bladder is normal Stomach/Bowel: Stomach is within normal limits. Appendix appears normal. No evidence of bowel wall thickening, distention, or inflammatory changes. Vascular/Lymphatic: No significant vascular findings are present. No enlarged abdominal or pelvic lymph nodes. Reproductive: Uterus and bilateral adnexa are unremarkable. Other: No abdominal wall hernia or abnormality. No abdominopelvic ascites. Musculoskeletal: No acute or significant osseous findings. IMPRESSION: 1. Negative for hydronephrosis or ureteral stone. Bilateral nonobstructing kidney stones. 2. No CT evidence for acute intra-abdominal or pelvic abnormality. Electronically Signed   By: Jasmine PangKim  Fujinaga M.D.   On: 08/09/2020 18:20   DG Chest 2 View  Result Date: 08/09/2020 CLINICAL DATA:  Hypertension, bradycardia EXAM: CHEST - 2 VIEW COMPARISON:  06/15/2020 FINDINGS: Frontal and lateral views of the chest demonstrate an unremarkable cardiac silhouette. No airspace disease, effusion, or pneumothorax. No acute bony abnormalities. IMPRESSION: 1. No acute intrathoracic process. Electronically Signed   By: Sharlet SalinaMichael  Brown M.D.   On: 08/09/2020 15:58    ____________________________________________   PROCEDURES  Procedure(s) performed (including Critical Care):  .1-3 Lead EKG Interpretation Performed by: Merwyn KatosBradler, Izsak Meir K, MD Authorized by: Merwyn KatosBradler, Paisly Fingerhut K, MD     Interpretation: abnormal     ECG rate:  65   ECG rate assessment: normal     Rhythm: sinus rhythm     Ectopy: PVCs      Conduction: normal       ____________________________________________   INITIAL IMPRESSION / ASSESSMENT AND PLAN / ED COURSE  As part of my medical decision making, I reviewed the following data within the electronic MEDICAL RECORD NUMBER Nursing notes reviewed and incorporated, Labs reviewed, EKG interpreted, Old chart reviewed, Radiograph reviewed and Notes from prior ED visits reviewed and incorporated        Patients symptoms not typical for emergent causes of abdominal pain such as, but not limited to, appendicitis, abdominal aortic aneurysm, surgical biliary disease, pancreatitis, SBO, mesenteric ischemia, serious intra-abdominal bacterial illness. Presentation also not typical of gynecologic emergencies such as TOA, Ovarian Torsion, PID. Not Ectopic. Doubt atypical ACS.  Patient likely experiencing some sort of radiculopathy given the bilateral and dermatomal distribution of this pain.  Patient's bradycardia and hypertension are currently being evaluated by her primary care physician as well as cardiology.  Patient has no complaints related to palpitations, chest pain, dyspnea on exertion, nausea/vomiting  Pt tolerating PO. Disposition: Patient will be discharged with strict return precautions and follow up with primary MD within 12-24 hours for further evaluation. Patient understands that this still  may have an early presentation of an emergent medical condition such as appendicitis that will require a recheck.      ____________________________________________   FINAL CLINICAL IMPRESSION(S) / ED DIAGNOSES  Final diagnoses:  Primary hypertension  Lower abdominal pain  Lumbar back pain  Lumbar radiculopathy     ED Discharge Orders    None       Note:  This document was prepared using Dragon voice recognition software and may include unintentional dictation errors.   Merwyn Katos, MD 08/09/20 2101

## 2020-08-09 NOTE — Patient Instructions (Signed)
Bradycardia, Adult Bradycardia is a slower-than-normal heartbeat. A normal resting heart rate for an adult ranges from 60 to 100 beats per minute. With bradycardia, the resting heart rate is less than 60 beats per minute. Bradycardia can prevent enough oxygen from reaching certain areas of your body when you are active. It can be serious if it keeps enough oxygen from reaching your brain and other parts of your body. Bradycardia is not a problem for everyone. For some healthy adults, a slow resting heart rate is normal. What are the causes? This condition may be caused by:  A problem with the heart, including: ? A problem with the heart's electrical system, such as a heart block. With a heart block, electrical signals between the chambers of the heart are partially or completely blocked, so they are not able to work as they should. ? A problem with the heart's natural pacemaker (sinus node). ? Heart disease. ? A heart attack. ? Heart damage. ? Lyme disease. ? A heart infection. ? A heart condition that is present at birth (congenital heart defect).  Certain medicines that treat heart conditions.  Certain conditions, such as hypothyroidism and obstructive sleep apnea.  Problems with the balance of chemicals and other substances, like potassium, in the blood.  Trauma.  Radiation therapy.   What increases the risk? You are more likely to develop this condition if you:  Are age 65 or older.  Have high blood pressure (hypertension), high cholesterol (hyperlipidemia), or diabetes.  Drink heavily, use tobacco or nicotine products, or use drugs. What are the signs or symptoms? Symptoms of this condition include:  Light-headedness.  Feeling faint or fainting.  Fatigue and weakness.  Trouble with activity or exercise.  Shortness of breath.  Chest pain (angina).  Drowsiness.  Confusion.  Dizziness. How is this diagnosed? This condition may be diagnosed based on:  Your  symptoms.  Your medical history.  A physical exam. During the exam, your health care provider will listen to your heartbeat and check your pulse. To confirm the diagnosis, your health care provider may order tests, such as:  Blood tests.  An electrocardiogram (ECG). This test records the heart's electrical activity. The test can show how fast your heart is beating and whether the heartbeat is steady.  A test in which you wear a portable device (event recorder or Holter monitor) to record your heart's electrical activity while you go about your day.  Anexercise test. How is this treated? Treatment for this condition depends on the cause of the condition and how severe your symptoms are. Treatment may involve:  Treatment of the underlying condition.  Changing your medicines or how much medicine you take.  Having a small, battery-operated device called a pacemaker implanted under the skin. When bradycardia occurs, this device can be used to increase your heart rate and help your heart beat in a regular rhythm. Follow these instructions at home: Lifestyle  Manage any health conditions that contribute to bradycardia as told by your health care provider.  Follow a heart-healthy diet. A nutrition specialist (dietitian) can help educate you about healthy food options and changes.  Follow an exercise program that is approved by your health care provider.  Maintain a healthy weight.  Try to reduce or manage your stress, such as with yoga or meditation. If you need help reducing stress, ask your health care provider.  Do not use any products that contain nicotine or tobacco, such as cigarettes, e-cigarettes, and chewing tobacco. If you need   help quitting, ask your health care provider.  Do not use illegal drugs.  Limit alcohol intake to no more than 1 drink a day for nonpregnant women and 2 drinks a day for men. Be aware of how much alcohol is in your drink. In the [place] one drink equals  one 12 oz bottle of beer (355 mL), one 5 oz glass of wine (148 mL), or one 1 oz glass of hard liquor (44 mL).   General instructions  Take over-the-counter and prescription medicines only as told by your health care provider.  Keep all follow-up visits as told by your health care provider. This is important. How is this prevented? In some cases, bradycardia may be prevented by:  Treating underlying medical problems.  Stopping behaviors or medicines that can trigger the condition. Contact a health care provider if you:  Feel light-headed or dizzy.  Almost faint.  Feel weak or are easily fatigued during physical activity.  Experience confusion or have memory problems. Get help right away if:  You faint.  You have: ? An irregular heartbeat (palpitations). ? Chest pain. ? Trouble breathing. Summary  Bradycardia is a slower-than-normal heartbeat. With bradycardia, the resting heart rate is less than 60 beats per minute.  Treatment for this condition depends on the cause.  Manage any health conditions that contribute to bradycardia as told by your health care provider.  Do not use any products that contain nicotine or tobacco, such as cigarettes, e-cigarettes, and chewing tobacco, and limit alcohol intake.  Keep all follow-up visits as told by your health care provider. This is important. This information is not intended to replace advice given to you by your health care provider. Make sure you discuss any questions you have with your health care provider. Document Revised: 11/11/2017 Document Reviewed: 10/09/2017 Elsevier Patient Education  2021 Elsevier Inc. https://www.diabeteseducator.org/docs/default-source/living-with-diabetes/conquering-the-grocery-store-v1.pdf?sfvrsn=4">  Carbohydrate Counting for Diabetes Mellitus, Adult Carbohydrate counting is a method of keeping track of how many carbohydrates you eat. Eating carbohydrates naturally increases the amount of sugar  (glucose) in the blood. Counting how many carbohydrates you eat improves your blood glucose control, which helps you manage your diabetes. It is important to know how many carbohydrates you can safely have in each meal. This is different for every person. A dietitian can help you make a meal plan and calculate how many carbohydrates you should have at each meal and snack. What foods contain carbohydrates? Carbohydrates are found in the following foods:  Grains, such as breads and cereals.  Dried beans and soy products.  Starchy vegetables, such as potatoes, peas, and corn.  Fruit and fruit juices.  Milk and yogurt.  Sweets and snack foods, such as cake, cookies, candy, chips, and soft drinks.   How do I count carbohydrates in foods? There are two ways to count carbohydrates in food. You can read food labels or learn standard serving sizes of foods. You can use either of the methods or a combination of both. Using the Nutrition Facts label The Nutrition Facts list is included on the labels of almost all packaged foods and beverages in the [place] It includes:  The serving size.  Information about nutrients in each serving, including the grams (g) of carbohydrate per serving. To use the Nutrition Facts:  Decide how many servings you will have.  Multiply the number of servings by the number of carbohydrates per serving.  The resulting number is the total amount of carbohydrates that you will be having. Learning the standard serving sizes  of foods When you eat carbohydrate foods that are not packaged or do not include Nutrition Facts on the label, you need to measure the servings in order to count the amount of carbohydrates.  Measure the foods that you will eat with a food scale or measuring cup, if needed.  Decide how many standard-size servings you will eat.  Multiply the number of servings by 15. For foods that contain carbohydrates, one serving equals 15 g of carbohydrates. ? For  example, if you eat 2 cups or 10 oz (300 g) of strawberries, you will have eaten 2 servings and 30 g of carbohydrates (2 servings x 15 g = 30 g).  For foods that have more than one food mixed, such as soups and casseroles, you must count the carbohydrates in each food that is included. The following list contains standard serving sizes of common carbohydrate-rich foods. Each of these servings has about 15 g of carbohydrates:  1 slice of bread.  1 six-inch (15 cm) tortilla.  ? cup or 2 oz (53 g) cooked rice or pasta.   cup or 3 oz (85 g) cooked or canned, drained and rinsed beans or lentils.   cup or 3 oz (85 g) starchy vegetable, such as peas, corn, or squash.   cup or 4 oz (120 g) hot cereal.   cup or 3 oz (85 g) boiled or mashed potatoes, or  or 3 oz (85 g) of a large baked potato.   cup or 4 fl oz (118 mL) fruit juice.  1 cup or 8 fl oz (237 mL) milk.  1 small or 4 oz (106 g) apple.   or 2 oz (63 g) of a medium banana.  1 cup or 5 oz (150 g) strawberries.  3 cups or 1 oz (24 g) popped popcorn. What is an example of carbohydrate counting? To calculate the number of carbohydrates in this sample meal, follow the steps shown below. Sample meal  3 oz (85 g) chicken breast.  ? cup or 4 oz (106 g) brown rice.   cup or 3 oz (85 g) corn.  1 cup or 8 fl oz (237 mL) milk.  1 cup or 5 oz (150 g) strawberries with sugar-free whipped topping. Carbohydrate calculation 1. Identify the foods that contain carbohydrates: ? Rice. ? Corn. ? Milk. ? Strawberries. 2. Calculate how many servings you have of each food: ? 2 servings rice. ? 1 serving corn. ? 1 serving milk. ? 1 serving strawberries. 3. Multiply each number of servings by 15 g: ? 2 servings rice x 15 g = 30 g. ? 1 serving corn x 15 g = 15 g. ? 1 serving milk x 15 g = 15 g. ? 1 serving strawberries x 15 g = 15 g. 4. Add together all of the amounts to find the total grams of carbohydrates eaten: ? 30 g + 15  g + 15 g + 15 g = 75 g of carbohydrates total. What are tips for following this plan? Shopping  Develop a meal plan and then make a shopping list.  Buy fresh and frozen vegetables, fresh and frozen fruit, dairy, eggs, beans, lentils, and whole grains.  Look at food labels. Choose foods that have more fiber and less sugar.  Avoid processed foods and foods with added sugars. Meal planning  Aim to have the same amount of carbohydrates at each meal and for each snack time.  Plan to have regular, balanced meals and snacks. Where to  find more information  American Diabetes Association: www.diabetes.org  Centers for Disease Control and Prevention: [employer].br Summary  Carbohydrate counting is a method of keeping track of how many carbohydrates you eat.  Eating carbohydrates naturally increases the amount of sugar (glucose) in the blood.  Counting how many carbohydrates you eat improves your blood glucose control, which helps you manage your diabetes.  A dietitian can help you make a meal plan and calculate how many carbohydrates you should have at each meal and snack. This information is not intended to replace advice given to you by your health care provider. Make sure you discuss any questions you have with your health care provider. Document Revised: 04/30/2019 Document Reviewed: 05/01/2019 Elsevier Patient Education  2021 [employer].

## 2020-08-09 NOTE — ED Notes (Signed)
ED Provider at bedside. 

## 2020-08-16 ENCOUNTER — Other Ambulatory Visit: Payer: Self-pay

## 2020-08-16 MED FILL — Insulin Syringe/Needle U-100 1 ML 31 x 15/64": 30 days supply | Qty: 30 | Fill #0 | Status: AC

## 2020-08-18 ENCOUNTER — Other Ambulatory Visit: Payer: Self-pay

## 2020-08-18 ENCOUNTER — Other Ambulatory Visit: Payer: Self-pay | Admitting: Gerontology

## 2020-08-18 DIAGNOSIS — E1165 Type 2 diabetes mellitus with hyperglycemia: Secondary | ICD-10-CM

## 2020-08-18 DIAGNOSIS — Z794 Long term (current) use of insulin: Secondary | ICD-10-CM

## 2020-08-18 MED ORDER — INSULIN GLARGINE 100 UNIT/ML SOLOSTAR PEN
76.0000 [IU] | PEN_INJECTOR | Freq: Every day | SUBCUTANEOUS | 5 refills | Status: DC
  Filled 2020-08-18 – 2020-08-26 (×2): qty 10, 13d supply, fill #0
  Filled 2020-08-26: qty 75, 98d supply, fill #0
  Filled 2020-08-26: qty 10, 13d supply, fill #0

## 2020-08-19 ENCOUNTER — Other Ambulatory Visit: Payer: Self-pay

## 2020-08-24 ENCOUNTER — Ambulatory Visit: Payer: Self-pay | Admitting: Gerontology

## 2020-08-24 ENCOUNTER — Other Ambulatory Visit: Payer: Self-pay

## 2020-08-24 ENCOUNTER — Encounter: Payer: Self-pay | Admitting: Gerontology

## 2020-08-24 VITALS — BP 143/80 | HR 82 | Temp 98.0°F | Resp 16 | Ht 68.0 in | Wt 279.6 lb

## 2020-08-24 DIAGNOSIS — E1165 Type 2 diabetes mellitus with hyperglycemia: Secondary | ICD-10-CM

## 2020-08-24 DIAGNOSIS — D509 Iron deficiency anemia, unspecified: Secondary | ICD-10-CM

## 2020-08-24 DIAGNOSIS — I10 Essential (primary) hypertension: Secondary | ICD-10-CM

## 2020-08-24 DIAGNOSIS — R413 Other amnesia: Secondary | ICD-10-CM

## 2020-08-24 DIAGNOSIS — R600 Localized edema: Secondary | ICD-10-CM

## 2020-08-24 DIAGNOSIS — Z09 Encounter for follow-up examination after completed treatment for conditions other than malignant neoplasm: Secondary | ICD-10-CM

## 2020-08-24 DIAGNOSIS — R001 Bradycardia, unspecified: Secondary | ICD-10-CM

## 2020-08-24 DIAGNOSIS — M545 Low back pain, unspecified: Secondary | ICD-10-CM

## 2020-08-24 MED ORDER — CHLORTHALIDONE 25 MG PO TABS
12.5000 mg | ORAL_TABLET | Freq: Every day | ORAL | 0 refills | Status: DC
  Filled 2020-08-24: qty 30, 60d supply, fill #0

## 2020-08-24 MED ORDER — FERROUS SULFATE 325 (65 FE) MG PO TABS
325.0000 mg | ORAL_TABLET | Freq: Every day | ORAL | 2 refills | Status: DC
  Filled 2020-08-24: qty 30, 30d supply, fill #0

## 2020-08-24 NOTE — Patient Instructions (Signed)
https://www.nhlbi.nih.gov/files/docs/public/heart/dash_brief.pdf">  DASH Eating Plan DASH stands for Dietary Approaches to Stop Hypertension. The DASH eating plan is a healthy eating plan that has been shown to:  Reduce high blood pressure (hypertension).  Reduce your risk for type 2 diabetes, heart disease, and stroke.  Help with weight loss. What are tips for following this plan? Reading food labels  Check food labels for the amount of salt (sodium) per serving. Choose foods with less than 5 percent of the Daily Value of sodium. Generally, foods with less than 300 milligrams (mg) of sodium per serving fit into this eating plan.  To find whole grains, look for the word "whole" as the first word in the ingredient list. Shopping  Buy products labeled as "low-sodium" or "no salt added."  Buy fresh foods. Avoid canned foods and pre-made or frozen meals. Cooking  Avoid adding salt when cooking. Use salt-free seasonings or herbs instead of table salt or sea salt. Check with your health care provider or pharmacist before using salt substitutes.  Do not fry foods. Cook foods using healthy methods such as baking, boiling, grilling, roasting, and broiling instead.  Cook with heart-healthy oils, such as olive, canola, avocado, soybean, or sunflower oil. Meal planning  Eat a balanced diet that includes: ? 4 or more servings of fruits and 4 or more servings of vegetables each day. Try to fill one-half of your plate with fruits and vegetables. ? 6-8 servings of whole grains each day. ? Less than 6 oz (170 g) of lean meat, poultry, or fish each day. A 3-oz (85-g) serving of meat is about the same size as a deck of cards. One egg equals 1 oz (28 g). ? 2-3 servings of low-fat dairy each day. One serving is 1 cup (237 mL). ? 1 serving of nuts, seeds, or beans 5 times each week. ? 2-3 servings of heart-healthy fats. Healthy fats called omega-3 fatty acids are found in foods such as walnuts,  flaxseeds, fortified milks, and eggs. These fats are also found in cold-water fish, such as sardines, salmon, and mackerel.  Limit how much you eat of: ? Canned or prepackaged foods. ? Food that is high in trans fat, such as some fried foods. ? Food that is high in saturated fat, such as fatty meat. ? Desserts and other sweets, sugary drinks, and other foods with added sugar. ? Full-fat dairy products.  Do not salt foods before eating.  Do not eat more than 4 egg yolks a week.  Try to eat at least 2 vegetarian meals a week.  Eat more home-cooked food and less restaurant, buffet, and fast food.   Lifestyle  When eating at a restaurant, ask that your food be prepared with less salt or no salt, if possible.  If you drink alcohol: ? Limit how much you use to:  0-1 drink a day for women who are not pregnant.  0-2 drinks a day for men. ? Be aware of how much alcohol is in your drink. In the [place] one drink equals one 12 oz bottle of beer (355 mL), one 5 oz glass of wine (148 mL), or one 1 oz glass of hard liquor (44 mL). General information  Avoid eating more than 2,300 mg of salt a day. If you have hypertension, you may need to reduce your sodium intake to 1,500 mg a day.  Work with your health care provider to maintain a healthy body weight or to lose weight. Ask what an ideal weight is for   you.  Get at least 30 minutes of exercise that causes your heart to beat faster (aerobic exercise) most days of the week. Activities may include walking, swimming, or biking.  Work with your health care provider or dietitian to adjust your eating plan to your individual calorie needs. What foods should I eat? Fruits All fresh, dried, or frozen fruit. Canned fruit in natural juice (without added sugar). Vegetables Fresh or frozen vegetables (raw, steamed, roasted, or grilled). Low-sodium or reduced-sodium tomato and vegetable juice. Low-sodium or reduced-sodium tomato sauce and tomato paste.  Low-sodium or reduced-sodium canned vegetables. Grains Whole-grain or whole-wheat bread. Whole-grain or whole-wheat pasta. Brown rice. Oatmeal. Quinoa. Bulgur. Whole-grain and low-sodium cereals. Pita bread. Low-fat, low-sodium crackers. Whole-wheat flour tortillas. Meats and other proteins Skinless chicken or turkey. Ground chicken or turkey. Pork with fat trimmed off. Fish and seafood. Egg whites. Dried beans, peas, or lentils. Unsalted nuts, nut butters, and seeds. Unsalted canned beans. Lean cuts of beef with fat trimmed off. Low-sodium, lean precooked or cured meat, such as sausages or meat loaves. Dairy Low-fat (1%) or fat-free (skim) milk. Reduced-fat, low-fat, or fat-free cheeses. Nonfat, low-sodium ricotta or cottage cheese. Low-fat or nonfat yogurt. Low-fat, low-sodium cheese. Fats and oils Soft margarine without trans fats. Vegetable oil. Reduced-fat, low-fat, or light mayonnaise and salad dressings (reduced-sodium). Canola, safflower, olive, avocado, soybean, and sunflower oils. Avocado. Seasonings and condiments Herbs. Spices. Seasoning mixes without salt. Other foods Unsalted popcorn and pretzels. Fat-free sweets. The items listed above may not be a complete list of foods and beverages you can eat. Contact a dietitian for more information. What foods should I avoid? Fruits Canned fruit in a light or heavy syrup. Fried fruit. Fruit in cream or butter sauce. Vegetables Creamed or fried vegetables. Vegetables in a cheese sauce. Regular canned vegetables (not low-sodium or reduced-sodium). Regular canned tomato sauce and paste (not low-sodium or reduced-sodium). Regular tomato and vegetable juice (not low-sodium or reduced-sodium). Pickles. Olives. Grains Baked goods made with fat, such as croissants, muffins, or some breads. Dry pasta or rice meal packs. Meats and other proteins Fatty cuts of meat. Ribs. Fried meat. Bacon. Bologna, salami, and other precooked or cured meats, such as  sausages or meat loaves. Fat from the back of a pig (fatback). Bratwurst. Salted nuts and seeds. Canned beans with added salt. Canned or smoked fish. Whole eggs or egg yolks. Chicken or turkey with skin. Dairy Whole or 2% milk, cream, and half-and-half. Whole or full-fat cream cheese. Whole-fat or sweetened yogurt. Full-fat cheese. Nondairy creamers. Whipped toppings. Processed cheese and cheese spreads. Fats and oils Butter. Stick margarine. Lard. Shortening. Ghee. Bacon fat. Tropical oils, such as coconut, palm kernel, or palm oil. Seasonings and condiments Onion salt, garlic salt, seasoned salt, table salt, and sea salt. Worcestershire sauce. Tartar sauce. Barbecue sauce. Teriyaki sauce. Soy sauce, including reduced-sodium. Steak sauce. Canned and packaged gravies. Fish sauce. Oyster sauce. Cocktail sauce. Store-bought horseradish. Ketchup. Mustard. Meat flavorings and tenderizers. Bouillon cubes. Hot sauces. Pre-made or packaged marinades. Pre-made or packaged taco seasonings. Relishes. Regular salad dressings. Other foods Salted popcorn and pretzels. The items listed above may not be a complete list of foods and beverages you should avoid. Contact a dietitian for more information. Where to find more information  National Heart, Lung, and Blood Institute: www.nhlbi.nih.gov  American Heart Association: www.heart.org  Academy of Nutrition and Dietetics: www.eatright.org  National Kidney Foundation: www.kidney.org Summary  The DASH eating plan is a healthy eating plan that has been shown to   reduce high blood pressure (hypertension). It may also reduce your risk for type 2 diabetes, heart disease, and stroke.  When on the DASH eating plan, aim to eat more fresh fruits and vegetables, whole grains, lean proteins, low-fat dairy, and heart-healthy fats.  With the DASH eating plan, you should limit salt (sodium) intake to 2,300 mg a day. If you have hypertension, you may need to reduce your  sodium intake to 1,500 mg a day.  Work with your health care provider or dietitian to adjust your eating plan to your individual calorie needs. This information is not intended to replace advice given to you by your health care provider. Make sure you discuss any questions you have with your health care provider. Document Revised: 04/03/2019 Document Reviewed: 04/03/2019 Elsevier Patient Education  2021 [employer] https://www.diabeteseducator.org/docs/default-source/living-with-diabetes/conquering-the-grocery-store-v1.pdf?sfvrsn=4">  Carbohydrate Counting for Diabetes Mellitus, Adult Carbohydrate counting is a method of keeping track of how many carbohydrates you eat. Eating carbohydrates naturally increases the amount of sugar (glucose) in the blood. Counting how many carbohydrates you eat improves your blood glucose control, which helps you manage your diabetes. It is important to know how many carbohydrates you can safely have in each meal. This is different for every person. A dietitian can help you make a meal plan and calculate how many carbohydrates you should have at each meal and snack. What foods contain carbohydrates? Carbohydrates are found in the following foods:  Grains, such as breads and cereals.  Dried beans and soy products.  Starchy vegetables, such as potatoes, peas, and corn.  Fruit and fruit juices.  Milk and yogurt.  Sweets and snack foods, such as cake, cookies, candy, chips, and soft drinks.   How do I count carbohydrates in foods? There are two ways to count carbohydrates in food. You can read food labels or learn standard serving sizes of foods. You can use either of the methods or a combination of both. Using the Nutrition Facts label The Nutrition Facts list is included on the labels of almost all packaged foods and beverages in the [place] It includes:  The serving size.  Information about nutrients in each serving, including the grams (g) of carbohydrate  per serving. To use the Nutrition Facts:  Decide how many servings you will have.  Multiply the number of servings by the number of carbohydrates per serving.  The resulting number is the total amount of carbohydrates that you will be having. Learning the standard serving sizes of foods When you eat carbohydrate foods that are not packaged or do not include Nutrition Facts on the label, you need to measure the servings in order to count the amount of carbohydrates.  Measure the foods that you will eat with a food scale or measuring cup, if needed.  Decide how many standard-size servings you will eat.  Multiply the number of servings by 15. For foods that contain carbohydrates, one serving equals 15 g of carbohydrates. ? For example, if you eat 2 cups or 10 oz (300 g) of strawberries, you will have eaten 2 servings and 30 g of carbohydrates (2 servings x 15 g = 30 g).  For foods that have more than one food mixed, such as soups and casseroles, you must count the carbohydrates in each food that is included. The following list contains standard serving sizes of common carbohydrate-rich foods. Each of these servings has about 15 g of carbohydrates:  1 slice of bread.  1 six-inch (15 cm) tortilla.  ? cup or 2   oz (53 g) cooked rice or pasta.   cup or 3 oz (85 g) cooked or canned, drained and rinsed beans or lentils.   cup or 3 oz (85 g) starchy vegetable, such as peas, corn, or squash.   cup or 4 oz (120 g) hot cereal.   cup or 3 oz (85 g) boiled or mashed potatoes, or  or 3 oz (85 g) of a large baked potato.   cup or 4 fl oz (118 mL) fruit juice.  1 cup or 8 fl oz (237 mL) milk.  1 small or 4 oz (106 g) apple.   or 2 oz (63 g) of a medium banana.  1 cup or 5 oz (150 g) strawberries.  3 cups or 1 oz (24 g) popped popcorn. What is an example of carbohydrate counting? To calculate the number of carbohydrates in this sample meal, follow the steps shown below. Sample  meal  3 oz (85 g) chicken breast.  ? cup or 4 oz (106 g) brown rice.   cup or 3 oz (85 g) corn.  1 cup or 8 fl oz (237 mL) milk.  1 cup or 5 oz (150 g) strawberries with sugar-free whipped topping. Carbohydrate calculation 1. Identify the foods that contain carbohydrates: ? Rice. ? Corn. ? Milk. ? Strawberries. 2. Calculate how many servings you have of each food: ? 2 servings rice. ? 1 serving corn. ? 1 serving milk. ? 1 serving strawberries. 3. Multiply each number of servings by 15 g: ? 2 servings rice x 15 g = 30 g. ? 1 serving corn x 15 g = 15 g. ? 1 serving milk x 15 g = 15 g. ? 1 serving strawberries x 15 g = 15 g. 4. Add together all of the amounts to find the total grams of carbohydrates eaten: ? 30 g + 15 g + 15 g + 15 g = 75 g of carbohydrates total. What are tips for following this plan? Shopping  Develop a meal plan and then make a shopping list.  Buy fresh and frozen vegetables, fresh and frozen fruit, dairy, eggs, beans, lentils, and whole grains.  Look at food labels. Choose foods that have more fiber and less sugar.  Avoid processed foods and foods with added sugars. Meal planning  Aim to have the same amount of carbohydrates at each meal and for each snack time.  Plan to have regular, balanced meals and snacks. Where to find more information  American Diabetes Association: www.diabetes.org  Centers for Disease Control and Prevention: www.cdc.gov Summary  Carbohydrate counting is a method of keeping track of how many carbohydrates you eat.  Eating carbohydrates naturally increases the amount of sugar (glucose) in the blood.  Counting how many carbohydrates you eat improves your blood glucose control, which helps you manage your diabetes.  A dietitian can help you make a meal plan and calculate how many carbohydrates you should have at each meal and snack. This information is not intended to replace advice given to you by your health care  provider. Make sure you discuss any questions you have with your health care provider. Document Revised: 04/30/2019 Document Reviewed: 05/01/2019 Elsevier Patient Education  2021 [employer]  

## 2020-08-24 NOTE — Progress Notes (Signed)
Established Patient Office Visit  Subjective:  Patient ID: [name], female    DOB: 1975-09-05  Age: 45 y.o. MRN: [id number]  CC: Hospital follow up  HPI [name] is a 45 year old female who presents for follow up after an ED visit. At her last appt at [facility] on 08/09/20, she was found to be bradycardic into the 40's bpm and hypertensive. She was also reporting back pain, which is chronic but she stated that it was more severe than usual. She was found to been in sinus rhythm on her EKG with frequent PVC's. Her BP was still elevated upon arrival to the ED but resolved without pharm intervention. She denies any episodes of lightheadedness or dizziness, palpitations, or chest pain. Her lab results did show a microcytic, hypochromic anemia which has been persistent on her last two CBC results. She does admit to experiencing persistent fatigue but denies any excessive bruising. They investigated her reports of back pain and she was diagnosed with radiculopathy. She was not discharged with any medications or further referrals. She denies any saddle anesthesia or bowel/bladder incontinence. She denies numbness/tingling to her feet and has no difficulties with ambulation. She was provided with a list of exercises to perform but has had difficulties doing so given the layout of her house.   She does report some memory impairment. Lately, she feels as though she has difficulties remembering what she was going to get or do when she goes into a different room. She has not experienced any episodes of forgetting where she is going when driving or forgetting names of loved ones. She has no difficulties with ADLs. She denies any family history of alzheimer's or dementia.   At her visit in February, her Hgb A1c was increased to 9.1% from 8.6%. Her blood sugars at home have been uncontrolled, ranging from high 100's to the 200's. Her FSBG today was 167 mg/dL. She denies polyuria, polydipsia, or  polyphagia. She does not report any episodes of hypoglycemia and performs daily self foot exams. She is compliant with her medication regimen and continues to make healthy lifestyle changes.    Past Medical History:  Diagnosis Date  . Depression   . Diabetes mellitus without complication ([place])   . GERD (gastroesophageal reflux disease)   . Headache   . History of kidney stones   . History of nephrolithiasis 2015   via CT imaging, including an obstructing stone  . Hyperlipidemia   . Hypertension 12/2019   per patient, she has never been treated for high blood pressure  . Low back pain 12/2019  . Neuromuscular disorder ([place])    neuropathy    Past Surgical History:  Procedure Laterality Date  . CHOLECYSTECTOMY  1998  . CYSTOSCOPY WITH STENT PLACEMENT Right 12/09/2019   Procedure: CYSTOSCOPY WITH STENT PLACEMENT;  Surgeon: Abbie Sons, MD;  Location: [facility] ORS;  Service: Urology;  Laterality: Right;  . CYSTOSCOPY/RETROGRADE/URETEROSCOPY  12/29/2019   Procedure: CYSTOSCOPY/RETROGRADE/URETEROSCOPY;  Surgeon: Abbie Sons, MD;  Location: [facility] ORS;  Service: Urology;;    Family History  Problem Relation Age of Onset  . Kidney failure Father   . Pancreatic cancer Maternal Grandmother     Social History   Socioeconomic History  . Marital status: Single    Spouse name: Not on file  . Number of children: Not on file  . Years of education: Not on file  . Highest education level: Not on file  Occupational History  . Occupation: not  currently working  Tobacco Use  . Smoking status: Never Smoker  . Smokeless tobacco: Never Used  Vaping Use  . Vaping Use: Never used  Substance and Sexual Activity  . Alcohol use: No  . Drug use: No  . Sexual activity: Never  Other Topics Concern  . Not on file  Social History Narrative   - Needs a blood sugar monitor       Patient said she is living with her mom and things are going ok. Mom provides for her food, transport, etc.        Patient wants to wait until next visit before any information is shared.    Social Determinants of Health   Financial Resource Strain: Not on file  Food Insecurity: Not on file  Transportation Needs: Not on file  Physical Activity: Not on file  Stress: Not on file  Social Connections: Not on file  Intimate Partner Violence: Not on file    Outpatient Medications Prior to Visit  Medication Sig Dispense Refill  . cephALEXin (KEFLEX) 500 MG capsule TAKE ONE CAPSULE BY MOUTH 2 TIMES A DAY 20 capsule 0  . ibuprofen (ADVIL) 600 MG tablet Take 600 mg by mouth every 6 (six) hours as needed for mild pain or moderate pain.    Marland Kitchen ibuprofen (ADVIL) 600 MG tablet TAKE ONE TABLET BY MOUTH EVERY 6 HOURS AS NEEDED 30 tablet 0  . insulin glargine (LANTUS) 100 UNIT/ML injection Inject 0.76 mLs (76 Units total) into the skin at bedtime. . 10 mL 5  . Insulin Syringe-Needle U-100 31G X 15/64" 1 ML MISC USE AS DIRECTED TO DRAW UP INSULIN 30 each 11  . liraglutide (VICTOZA) 18 MG/3ML SOPN INJECT 1.$RemoveBefor'8MG'[id number]$  UNDER THE SKIN EVERY MORNING 9 mL 5  . OVER THE COUNTER MEDICATION Take 1 capsule by mouth daily. [facility] Gallbladder Care    . Potassium Citrate 15 MEQ (1620 MG) TBCR TAKE ONE TABLET BY MOUTH 2 TIMES A DAY WITH MEALS 60 tablet 11   No facility-administered medications prior to visit.    Allergies  Allergen Reactions  . Penicillins Hives  . Topiramate     Other reaction(s): Other (See Comments) Worsened migraines, N/V  . Topiramate Er Nausea And Vomiting    Intensified Migraines   . Valtrex [Valacyclovir] Itching    ROS Review of Systems  Constitutional: Positive for fatigue. Negative for fever and unexpected weight change.  HENT: Negative.   Respiratory: Negative.  Negative for shortness of breath.   Cardiovascular: Positive for leg swelling. Negative for chest pain and palpitations.  Gastrointestinal: Negative.   Endocrine: Negative.   Genitourinary: Negative.   Musculoskeletal: Positive  for back pain.  Skin: Negative.   Neurological: Negative.   Hematological: Does not bruise/bleed easily.  Psychiatric/Behavioral: Negative for confusion. The patient is not nervous/anxious.        Forgetfulness per pt       Objective:    Physical Exam Constitutional:      Appearance: Normal appearance. She is obese.  HENT:     Mouth/Throat:     Mouth: Mucous membranes are moist.     Pharynx: Oropharynx is clear.  Eyes:     Conjunctiva/sclera: Conjunctivae normal.     Pupils: Pupils are equal, round, and reactive to light.  Cardiovascular:     Rate and Rhythm: Normal rate and regular rhythm.     Pulses: Normal pulses.     Heart sounds: Normal heart sounds.  Pulmonary:     Effort:  Pulmonary effort is normal.     Breath sounds: Normal breath sounds.  Abdominal:     General: Bowel sounds are normal.     Palpations: Abdomen is soft.  Musculoskeletal:        General: Normal range of motion.     Right lower leg: 2+ Pitting Edema present.     Left lower leg: 1+ Pitting Edema present.  Skin:    General: Skin is warm and dry.     Capillary Refill: Capillary refill takes less than 2 seconds.  Neurological:     General: No focal deficit present.     Mental Status: She is alert and oriented to person, place, and time. Mental status is at baseline.     Motor: No weakness.     Gait: Gait normal.  Psychiatric:        Mood and Affect: Mood normal.        Behavior: Behavior normal.        Thought Content: Thought content normal.        Judgment: Judgment normal.     BP (!) 143/80 (BP Location: Left Arm, Patient Position: Sitting, Cuff Size: Large)   Pulse 82   Temp 98 F (36.7 C)   Resp 16   Ht 5\' 8"  (1.727 m)   Wt 279 lb 9.6 oz (126.8 kg)   LMP 05/21/2020 (Approximate)   SpO2 96%   BMI 42.51 kg/m  Wt Readings from Last 3 Encounters:  08/24/20 279 lb 9.6 oz (126.8 kg)  08/09/20 281 lb (127.5 kg)  08/09/20 281 lb (127.5 kg)     Health Maintenance Due  Topic Date Due   . Hepatitis C Screening  Never done  . PNEUMOCOCCAL POLYSACCHARIDE VACCINE AGE 80-64 HIGH RISK  Never done  . COVID-19 Vaccine (1) Never done  . TETANUS/TDAP  Never done  . PAP SMEAR-Modifier  Never done  . OPHTHALMOLOGY EXAM  07/04/2018  . COLONOSCOPY (Pts 45-18yrs Insurance coverage will need to be confirmed)  Never done  . FOOT EXAM  08/18/2020    There are no preventive care reminders to display for this patient.  Lab Results  Component Value Date   TSH 2.200 01/16/2018   Lab Results  Component Value Date   WBC 8.3 08/09/2020   HGB 11.6 (L) 08/09/2020   HCT 35.6 (L) 08/09/2020   MCV 71.5 (L) 08/09/2020   PLT 371 08/09/2020   Lab Results  Component Value Date   NA 138 08/09/2020   K 3.8 08/09/2020   CO2 26 08/09/2020   GLUCOSE 181 (H) 08/09/2020   BUN 15 08/09/2020   CREATININE 0.70 08/09/2020   BILITOT 0.5 03/23/2020   ALKPHOS 125 (H) 03/23/2020   AST 12 03/23/2020   ALT 14 03/23/2020   PROT 7.4 03/23/2020   ALBUMIN 4.0 03/23/2020   CALCIUM 9.6 08/09/2020   ANIONGAP 8 08/09/2020   Lab Results  Component Value Date   CHOL 142 11/11/2019   Lab Results  Component Value Date   HDL 32 (L) 11/11/2019   Lab Results  Component Value Date   LDLCALC 94 11/11/2019   Lab Results  Component Value Date   TRIG 84 11/11/2019   Lab Results  Component Value Date   CHOLHDL 4.4 11/11/2019   Lab Results  Component Value Date   HGBA1C 9.1 (A) 06/29/2020   HGBA1C 9.1 06/29/2020   HGBA1C 0 (A) 06/29/2020   HGBA1C 0.0 06/29/2020      Assessment & Plan:  1. Microcytic anemia Pt educated on proper administration of iron supplements. Educated on foods high in iron. Likely cause of fatigue and possible cause of memory impairment. - Iron Binding Cap (TIBC)([facility]) - Ferritin - CBC w/Diff; Future - ferrous sulfate 325 (65 FE) MG tablet; Take 1 tablet (325 mg total) by mouth daily with breakfast.  Dispense: 30 tablet; Refill: 2  2. Bradycardia Normal  rate today at visit. No irregularity noted upon auscultation. Due to history of bradycardia and abnormal EKGs, cardiology referral ordered. Pt advised to return to the ED with lightheadedness, dizziness, palpitations or chest pain. [facility] Financial Assistance application provided to patient.  - Ambulatory referral to Cardiology  3. Hospital discharge follow-up See above plan.  4. Type 2 diabetes mellitus with hyperglycemia, with long-term current use of insulin (HCC) Blood sugars poorly controlled on current regimen. Endocrinology referral for further evaluation and treatment. Continue daily self foot exams. Avoid sugary beverages and monitor carb intake. Check BS TID and bring log to next visit. - Ambulatory referral to Endocrinology - HgB A1c; Future - Comp Met (CMET); Future  5. Bilateral edema of lower extremity Pt noted to have edema at last visit as well as this one. Possibly related to HTN. Further evaluation by cardiology warranted. Will start on chlorthalidone for time being. Pt educated on possible side effects and when to notify. Advised to use compression stockings during the day and elevate feet when sitting.  - chlorthalidone (HYGROTON) 25 MG tablet; Take 0.5 tablets (12.5 mg total) by mouth daily.  Dispense: 30 tablet; Refill: 0 - Comp Met (CMET); Future  6. Essential hypertension Recurrent elevations in BP. Pt denies CP, vision changes, or headaches. She is experiencing bilateral lower extremity edema. She does not check her BP at home. She was advised to check her BP at home and keep a log. ACEi/ARB vs diuretic for initial treatment. Diuretic identified as appropriate first line treatment due to patient's lower extremity edema. Further evaluation and treatment by cardiology warranted. - chlorthalidone (HYGROTON) 25 MG tablet; Take 0.5 tablets (12.5 mg total) by mouth daily.  Dispense: 30 tablet; Refill: 0 - Comp Met (CMET); Future  7. Acute low back pain without sciatica,  unspecified back pain laterality Referral to [facility] orthopedic physician Dr. Vickki Hearing for further evaluation and treatment.   8. Memory impairment Mini-cog negative. Possibly related to anemia. Pt advised to monitor and notify of any worsening symptoms.   Follow-up:  Return in one month (09/21/20), or if symptoms worsen or do not improve.    Clayton Bibles, RN, BSN, FNP-S

## 2020-08-25 ENCOUNTER — Other Ambulatory Visit: Payer: Self-pay

## 2020-08-26 ENCOUNTER — Other Ambulatory Visit: Payer: Self-pay

## 2020-08-26 MED ORDER — INSULIN PEN NEEDLE 32G X 4 MM MISC
11 refills | Status: DC
  Filled 2020-08-26: qty 100, 30d supply, fill #0
  Filled 2020-12-30: qty 100, 25d supply, fill #1
  Filled 2021-01-25: qty 100, 100d supply, fill #2
  Filled 2021-04-20: qty 100, 25d supply, fill #2
  Filled 2021-05-29: qty 100, 25d supply, fill #3

## 2020-08-30 ENCOUNTER — Ambulatory Visit: Payer: Self-pay | Admitting: Endocrinology

## 2020-08-30 ENCOUNTER — Ambulatory Visit: Payer: Self-pay | Admitting: Specialist

## 2020-08-30 ENCOUNTER — Other Ambulatory Visit: Payer: Self-pay

## 2020-08-30 VITALS — Ht 68.0 in | Wt 281.9 lb

## 2020-08-30 DIAGNOSIS — E1142 Type 2 diabetes mellitus with diabetic polyneuropathy: Secondary | ICD-10-CM

## 2020-08-30 DIAGNOSIS — Z794 Long term (current) use of insulin: Secondary | ICD-10-CM

## 2020-08-30 NOTE — Progress Notes (Incomplete)
Follow up Diabetes/ Endocrine [facility]     Patient ID: [name], female   DOB: 26-Jul-1975, 45 y.o.   MRN: [id number] Assessment:  [name] is a 45 y.o. female who is seen in follow up for Type 2 diabetes mellitus with diabetic polyneuropathy, with long-term current use of insulin (HCC) [E11.42, Z79.4] at the request of [name], [name], NP.  Encounter Diagnoses 1. Type 2 diabetes mellitus with diabetic polyneuropathy, with long-term current use of insulin (HCC)     Assessment    Plan:  - Check BMP, Ca2+, Phosphate, Mg2+ for electrolyte imbalance. - Check TSH for endocrinopathy. - Follow up in 1 month to check on DM management with change in Lantus dosis. - Refer to ophthalmology for routine diabetic retinopathy screening.    Patient Instructions  Randie Heinz to see you. Please increase lantus by 2 units every week if AM sugars are over 150 mg/dl on average.  It is ok to go higher than 80 units per day.  Please stop at 90 units if you get to that dose.  For the other symptoms recommend labs to rule out electrolyte problems that might contribute since you are on the chlorthalidone.  Lets consider low dose metformin ER in a future visit. Re perimenopause, it is unclear to [place] if these symptoms are related to that so please let someone know if they don't improve.     Orders Placed This Encounter  Procedures  . Basic metabolic panel  . Magnesium  . Phosphorus  . TSH     Subjective:  HPI   Review of Systems  [name]  has a past medical history of Depression, Diabetes mellitus without complication (HCC), GERD (gastroesophageal reflux disease), Headache, History of kidney stones, History of nephrolithiasis (2015), Hyperlipidemia, Hypertension (12/2019), Low back pain (12/2019), and Neuromuscular disorder (HCC).  Family History, Social History, current Medications and allergies reviewed and updated in Epic.  Objective:    Height 5\' 8"  (1.727  m), weight 281 lb 14.4 oz (127.9 kg), last menstrual period 05/21/2020. Physical Exam      Data : I have personally reviewed pertinent labs and imaging studies, if indicated,  with the patient in clinic today.    Lab Orders     Basic metabolic panel     Magnesium     Phosphorus     TSH  HC Readings from Last 3 Encounters:  No data found for [facility]    Wt Readings from Last 3 Encounters:  08/30/20 281 lb 14.4 oz (127.9 kg)  08/24/20 279 lb 9.6 oz (126.8 kg)  08/09/20 281 lb (127.5 kg)

## 2020-08-30 NOTE — Patient Instructions (Signed)
Great to see you. Please increase lantus by 2 units every week if AM sugars are over 150 mg/dl on average.  It is ok to go higher than 80 units per day.  Please stop at 90 units if you get to that dose.  For the other symptoms recommend labs to rule out electrolyte problems that might contribute since you are on the chlorthalidone.  Lets consider low dose metformin ER in a future visit. Re perimenopause, it is unclear to [place] if these symptoms are related to that so please let someone know if they don't improve.

## 2020-08-30 NOTE — Progress Notes (Deleted)
Follow up Diabetes/ Endocrine [facility]     Patient ID: [name], female   DOB: Jun 25, 1975, 45 y.o.   MRN: [id number] Assessment:  [name] is a 45 y.o. female who is seen in follow up for Type 2 diabetes mellitus with diabetic polyneuropathy, with long-term current use of insulin (HCC) [E11.42, Z79.4] at the request of [name], [name], NP.  Encounter Diagnoses 1. Type 2 diabetes mellitus with diabetic polyneuropathy, with long-term current use of insulin [facility])     Assessment    Plan:        Patient Instructions  Great to see you. Please increase lantus by 2 units every week if AM sugars are over 150 mg/dl on average.  It is ok to go higher than 80 units per day.  Please stop at 90 units if you get to that dose.  For the other symptoms recommend labs to rule out electrolyte problems that might contribute since you are on the chlorthalidone.  Lets consider low dose metformin ER in a future visit. Re perimenopause, it is unclear to [place] if these symptoms are related to that so please let someone know if they don't improve.     Orders Placed This Encounter  Procedures  . Basic metabolic panel  . Magnesium  . Phosphorus  . TSH     Subjective:  HPI   Review of Systems  [name]  has a past medical history of Depression, Diabetes mellitus without complication (HCC), GERD (gastroesophageal reflux disease), Headache, History of kidney stones, History of nephrolithiasis (2015), Hyperlipidemia, Hypertension (12/2019), Low back pain (12/2019), and Neuromuscular disorder (HCC).  Family History, Social History, current Medications and allergies reviewed and updated in Epic.  Objective:    Height 5\' 8"  (1.727 m), weight 281 lb 14.4 oz (127.9 kg), last menstrual period 05/21/2020. Physical Exam      Data : I have personally reviewed pertinent labs and imaging studies, if indicated,  with the patient in clinic today.    Lab Orders     Basic  metabolic panel     Magnesium     Phosphorus     TSH  HC Readings from Last 3 Encounters:  No data found for [facility]    Wt Readings from Last 3 Encounters:  08/30/20 281 lb 14.4 oz (127.9 kg)  08/24/20 279 lb 9.6 oz (126.8 kg)  08/09/20 281 lb (127.5 kg)

## 2020-08-30 NOTE — Progress Notes (Unsigned)
HPI 1 mo history of mid back pain without radiation. There is no hx of trauma. She was seen in the ED for bradycardia. She states the doctor said she had a pinched nerve on both sides. I have read the ED note and they discusses possible radiculopathy because of the dermatomal pattern. Today she has no radicular pain. Her tenderness is lower thoracic not lumbar.   PHYSICAL ASSESSMENT Her gait is normal. She is able to walk on her toes and heels.   She is not tender to downward compression of shoulders or with en bloc rotation.  She is able to march in place with normal recruitment and relaxation. She is able to stand in place with regular trendelenburg sign.  She has a combined thoracolumbar lateral flexion is 25 degrees to R and L but she has pain when bending to the right.  She has 60 degrees of forward flexion. She has 10 degrees of extension with some pain.  Truncal rotation seated is 30 degrees each way. She had pain at the end range bilaterally.  Manual motor testing 5/5. Sensation in tact.  DTR's 1+ KJ and AJ.  PLAN AP and lateral TLT/L spine.

## 2020-08-30 NOTE — Progress Notes (Signed)
Follow up Diabetes/ Endocrine [facility]     Patient ID: [name], female   DOB: 11/23/1975, 45 y.o.   MRN: [id number] Assessment:  [name] is a 45 y.o. female who is seen in follow up for Type 2 diabetes mellitus with diabetic polyneuropathy, with long-term current use of insulin (HCC) [E11.42, Z79.4] at the request of [name], [name], NP.  Encounter Diagnoses 1. Type 2 diabetes mellitus with diabetic polyneuropathy, with long-term current use of insulin [facility])     Assessment   Patient is a 45 year old female with a history of T2DM, HTN, hyperlipidemia, GERD, and nephrolithiasis who presents for routine DM care follow-up. She is currently not at goal with treatment (A1c increasing and currently at 9.1).  Plan:  - Continue Victoza 1.8 mg in the morning.  - Increase nighttime Lantus by 2 units weekly if average fasting BG > 150.  Patient was reassured that it was all right to go over 80 units/day and was recommended to not go over 90 units total/day. Patient was also advised on the possibility to change to a more concentrated insulin if [facility] formulary allows it; add another medication (SGLT2i); change the Victoza dose; or restart metformin extended release formulation at a low dose as patient had experienced side effects in the past.  - Check BMP, Ca2+, Phosphate, Mg2+ for electrolyte imbalance.  - Check TSH for other endocrinopathy.  - Follow up in 1 month to check on DM management with change in Lantus dose.  - Refer to ophthalmology for routine diabetic retinopathy screening.  - Follow up with PCP about hot flashes, which may be perimenopausal.    Patient Instructions  Great to see you. Please increase lantus by 2 units every week if AM sugars are over 150 mg/dl on average.  It is ok to go higher than 80 units per day.  Please stop at 90 units if you get to that dose.  For the other symptoms recommend labs to rule out electrolyte problems that might  contribute since you are on the chlorthalidone.  Lets consider low dose metformin ER in a future visit. Re perimenopause, it is unclear to [place] if these symptoms are related to that so please let someone know if they don't improve.     Orders Placed This Encounter  Procedures  . Basic metabolic panel  . Magnesium  . Phosphorus  . TSH     Subjective:  Patient is a 45 year old female with a history of T2DM, HTN, hyperlipidemia, GERD, and nephrolithiasis who presents for routine DM care follow-up. She reports measuring her BG twice daily with fasting readings about 160-230 and evening readings over 230 but less than 300. She states that her current levels are lower after the Lantus dose increase (previous PM readings around 300). She currently manages her DM with Lantus 76 units at nighttime, Victoza 1.8 mg in the morning. She denies any side effects, missed doses, or financial barriers.  Patient denies symptoms of hyperglycemia and hypoglycemia. She continues to report finger numbness and states that she has been experiencing sporadic hand swelling when she wakes up in the morning for the past few weeks. She states that the swelling can be to the point she is unable to bend her fingers "stiff joints").   Patient reports that she was recently started on chlorthalidone 12.5 mg daily for her HTN, and has been on potassium citrate for her nephrolithiasis (urologist prescription). She states that she has not had an eye exam  in the past year.  Patient also reports "hot flashes" mostly in her arms, a "burning heat feeling" everyday for the past 1-2 months. Her LMP was in February 2022; before this, she has not had her period for over a year. She denies any injury and states that she recently had to change her sleeping position because of a recent diagnosis of herniated disk. She states that the hot flashes occur mostly in the morning along with the hand swelling. She denies sensation of heart racing or  estrogen intake.  She reports resolution of her big toe issue and decreased frequency of her chest pain (some arrhythmia she needs to follow up about per her cardiologist) -both noted in her last visit.  Patient reports taking steps to improve her diet, cooking more meals at home (on her donated new stove), and avoiding fried food since her HTN diagnosis. She describes her diet as follows: regular breakfast of eggs, bacon; lunch often at [facility] for a salad, baked potato and chicken; dinner at home mostly of canned soup. She denies any exercise, smoking, recreational drug use, and alcohol.        Review of Systems  Constitutional: Negative for unexpected weight change.  Cardiovascular: Negative for chest pain.    [name]  has a past medical history of Depression, Diabetes mellitus without complication (HCC), GERD (gastroesophageal reflux disease), Headache, History of kidney stones, History of nephrolithiasis (2015), Hyperlipidemia, Hypertension (12/2019), Low back pain (12/2019), and Neuromuscular disorder (HCC).  Family History, Social History, current Medications and allergies reviewed and updated in Epic.  Objective:    Height 5\' 8"  (1.727 m), weight 281 lb 14.4 oz (127.9 kg), last menstrual period 05/21/2020. Physical Exam Constitutional:      Appearance: Normal appearance.  Cardiovascular:     Rate and Rhythm: Normal rate and regular rhythm.  Pulmonary:     Effort: Pulmonary effort is normal.     Breath sounds: Normal breath sounds.  Musculoskeletal:        General: No swelling.  Neurological:     Mental Status: She is alert.   Neck: Acanthosis nigricans  Monofilament foot test: normal sensation throughout      Data : I have personally reviewed pertinent labs and imaging studies, if indicated,  with the patient in clinic today.    Lab Orders     Basic metabolic panel     Magnesium     Phosphorus     TSH  HC Readings from Last 3 Encounters:   No data found for [facility]    Wt Readings from Last 3 Encounters:  08/30/20 281 lb 14.4 oz (127.9 kg)  08/24/20 279 lb 9.6 oz (126.8 kg)  08/09/20 281 lb (127.5 kg)

## 2020-08-31 ENCOUNTER — Other Ambulatory Visit: Payer: Self-pay

## 2020-08-31 DIAGNOSIS — D509 Iron deficiency anemia, unspecified: Secondary | ICD-10-CM

## 2020-08-31 DIAGNOSIS — E1165 Type 2 diabetes mellitus with hyperglycemia: Secondary | ICD-10-CM

## 2020-08-31 DIAGNOSIS — I10 Essential (primary) hypertension: Secondary | ICD-10-CM

## 2020-08-31 DIAGNOSIS — E1142 Type 2 diabetes mellitus with diabetic polyneuropathy: Secondary | ICD-10-CM

## 2020-08-31 DIAGNOSIS — Z794 Long term (current) use of insulin: Secondary | ICD-10-CM

## 2020-08-31 DIAGNOSIS — R600 Localized edema: Secondary | ICD-10-CM

## 2020-09-01 LAB — SPECIMEN STATUS REPORT

## 2020-09-01 LAB — HEMOGLOBIN A1C
Est. average glucose Bld gHb Est-mCnc: 192 mg/dL
Hgb A1c MFr Bld: 8.3 % — ABNORMAL HIGH (ref 4.8–5.6)

## 2020-09-01 LAB — CBC WITH DIFFERENTIAL/PLATELET
Basophils Absolute: 0.1 10*3/uL (ref 0.0–0.2)
Basos: 1 %
EOS (ABSOLUTE): 0.3 10*3/uL (ref 0.0–0.4)
Eos: 4 %
Hematocrit: 36.9 % (ref 34.0–46.6)
Hemoglobin: 12 g/dL (ref 11.1–15.9)
Immature Grans (Abs): 0 10*3/uL (ref 0.0–0.1)
Immature Granulocytes: 0 %
Lymphocytes Absolute: 1.9 10*3/uL (ref 0.7–3.1)
Lymphs: 24 %
MCH: 24.4 pg — ABNORMAL LOW (ref 26.6–33.0)
MCHC: 32.5 g/dL (ref 31.5–35.7)
MCV: 75 fL — ABNORMAL LOW (ref 79–97)
Monocytes Absolute: 0.5 10*3/uL (ref 0.1–0.9)
Monocytes: 6 %
Neutrophils Absolute: 5.4 10*3/uL (ref 1.4–7.0)
Neutrophils: 65 %
Platelets: 412 10*3/uL (ref 150–450)
RBC: 4.92 x10E6/uL (ref 3.77–5.28)
RDW: 15.3 % (ref 11.7–15.4)
WBC: 8.1 10*3/uL (ref 3.4–10.8)

## 2020-09-01 LAB — COMPREHENSIVE METABOLIC PANEL
ALT: 15 IU/L (ref 0–32)
AST: 12 IU/L (ref 0–40)
Albumin/Globulin Ratio: 1.3 (ref 1.2–2.2)
Albumin: 4.4 g/dL (ref 3.8–4.8)
Alkaline Phosphatase: 134 IU/L — ABNORMAL HIGH (ref 44–121)
BUN/Creatinine Ratio: 15 (ref 9–23)
BUN: 13 mg/dL (ref 6–24)
Bilirubin Total: 0.4 mg/dL (ref 0.0–1.2)
CO2: 23 mmol/L (ref 20–29)
Calcium: 9.9 mg/dL (ref 8.7–10.2)
Chloride: 97 mmol/L (ref 96–106)
Creatinine, Ser: 0.84 mg/dL (ref 0.57–1.00)
Globulin, Total: 3.5 g/dL (ref 1.5–4.5)
Glucose: 224 mg/dL — ABNORMAL HIGH (ref 65–99)
Potassium: 4.1 mmol/L (ref 3.5–5.2)
Sodium: 138 mmol/L (ref 134–144)
Total Protein: 7.9 g/dL (ref 6.0–8.5)
eGFR: 86 mL/min/{1.73_m2} (ref >59)

## 2020-09-01 LAB — TSH: TSH: 2.13 u[IU]/mL (ref 0.450–4.500)

## 2020-09-01 LAB — MAGNESIUM: Magnesium: 1.7 mg/dL (ref 1.6–2.3)

## 2020-09-01 LAB — PHOSPHORUS: Phosphorus: 4.3 mg/dL (ref 3.0–4.3)

## 2020-09-02 ENCOUNTER — Other Ambulatory Visit: Payer: Self-pay

## 2020-09-02 ENCOUNTER — Ambulatory Visit
Admission: RE | Admit: 2020-09-02 | Discharge: 2020-09-02 | Disposition: A | Discharge status: Home / Self Care | Payer: Self-pay | Source: Ambulatory Visit | Attending: Urology | Admitting: Urology

## 2020-09-02 ENCOUNTER — Ambulatory Visit (INDEPENDENT_AMBULATORY_CARE_PROVIDER_SITE_OTHER): Payer: Self-pay | Admitting: Urology

## 2020-09-02 ENCOUNTER — Encounter: Payer: Self-pay | Admitting: Urology

## 2020-09-02 ENCOUNTER — Ambulatory Visit
Admission: RE | Admit: 2020-09-02 | Discharge: 2020-09-02 | Disposition: A | Discharge status: Home / Self Care | Payer: Self-pay | Attending: Urology | Admitting: Urology

## 2020-09-02 VITALS — BP 108/64 | HR 87 | Ht 68.0 in | Wt 274.0 lb

## 2020-09-02 DIAGNOSIS — N200 Calculus of kidney: Secondary | ICD-10-CM

## 2020-09-02 LAB — URINALYSIS, COMPLETE
Bilirubin, UA: NEGATIVE
Glucose, UA: NEGATIVE
Ketones, UA: NEGATIVE
Leukocytes,UA: NEGATIVE
Nitrite, UA: NEGATIVE
Specific Gravity, UA: 1.02 (ref 1.005–1.030)
Urobilinogen, Ur: 0.2 mg/dL (ref 0.2–1.0)
pH, UA: 5 (ref 5.0–7.5)

## 2020-09-02 LAB — MICROSCOPIC EXAMINATION

## 2020-09-02 NOTE — Progress Notes (Signed)
09/02/2020 11:21 AM   [name] 09-08-1975 [id number]  Referring provider: Jacelyn Pi, NP [address] [place],  [place] [address]  Chief Complaint  Patient presents with  . Nephrolithiasis    13mo w/KUB    Urologic history: 1.  Recurrent stone disease -Right ureteral stent placement 11/2019 for right proximal ureteral calculus with sepsis -Right ureteroscopy 12/29/2019 with calculus no longer present; Randall's plaques midpole calyces -Metabolic evaluation with hyperoxaluria, low urine pH and hyperuricosuria -Started potassium citrate and discussed dietary factors of increased protein and oxalate rich foods  HPI: 45 y.o. female presents for 45-month follow-up visit.   Notes occasional back pain  + Urinary frequency  No dysuria or gross hematuria  Remains on potassium citrate   PMH: Past Medical History:  Diagnosis Date  . Depression   . Diabetes mellitus without complication (HCC)   . GERD (gastroesophageal reflux disease)   . Headache   . History of kidney stones   . History of nephrolithiasis 2015   via CT imaging, including an obstructing stone  . Hyperlipidemia   . Hypertension 12/2019   per patient, she has never been treated for high blood pressure  . Low back pain 12/2019  . Neuromuscular disorder [facility])    neuropathy    Surgical History: Past Surgical History:  Procedure Laterality Date  . CHOLECYSTECTOMY  1998  . CYSTOSCOPY WITH STENT PLACEMENT Right 12/09/2019   Procedure: CYSTOSCOPY WITH STENT PLACEMENT;  Surgeon: Riki Altes, MD;  Location: [facility] ORS;  Service: Urology;  Laterality: Right;  . CYSTOSCOPY/RETROGRADE/URETEROSCOPY  12/29/2019   Procedure: CYSTOSCOPY/RETROGRADE/URETEROSCOPY;  Surgeon: Riki Altes, MD;  Location: [facility] ORS;  Service: Urology;;    Home Medications:  Allergies as of 09/02/2020      Reactions   Penicillins Hives   Topiramate    Other reaction(s): Other (See Comments) Worsened  migraines, N/V   Topiramate Er Nausea And Vomiting   Intensified Migraines   Valtrex [valacyclovir] Itching      Medication List       Accurate as of September 02, 2020 11:21 AM. If you have any questions, ask your nurse or doctor.        BD Veo Insulin Syringe U/F 31G X 15/64" 1 ML Misc Generic drug: Insulin Syringe-Needle U-100 USE AS DIRECTED TO DRAW UP INSULIN   chlorthalidone 25 MG tablet Commonly known as: HYGROTON Take 0.5 tablets (12.5 mg total) by mouth daily.   Comfort EZ Pen Needles 32G X 4 MM Misc Generic drug: Insulin Pen Needle Use as directed with insulin   FeroSul 325 (65 FE) MG tablet Generic drug: ferrous sulfate Take 1 tablet (325 mg total) by mouth daily with breakfast.   ibuprofen 600 MG tablet Commonly known as: ADVIL Take 600 mg by mouth every 6 (six) hours as needed for mild pain or moderate pain.   Lantus SoloStar 100 UNIT/ML Solostar Pen Generic drug: insulin glargine Inject 76 Units into the skin at bedtime.   OVER THE COUNTER MEDICATION Take 1 capsule by mouth daily. [facility] Gallbladder Care   Potassium Citrate 15 MEQ (1620 MG) Tbcr TAKE ONE TABLET BY MOUTH 2 TIMES A DAY WITH MEALS   Victoza 18 MG/3ML Sopn Generic drug: liraglutide INJECT 1.8MG  UNDER THE SKIN EVERY MORNING       Allergies:  Allergies  Allergen Reactions  . Penicillins Hives  . Topiramate     Other reaction(s): Other (See Comments) Worsened migraines, N/V  . Topiramate Er Nausea And  Vomiting    Intensified Migraines   . Valtrex [Valacyclovir] Itching    Family History: Family History  Problem Relation Age of Onset  . Kidney failure Father   . Pancreatic cancer Maternal Grandmother     Social History:  reports that she has never smoked. She has never used smokeless tobacco. She reports that she does not drink alcohol and does not use drugs.   Physical Exam: BP 108/64   Pulse 87   Ht 5\' 8"  (1.727 m)   Wt 274 lb (124.3 kg)   LMP 05/21/2020  (Approximate)   BMI 41.66 kg/m   Constitutional:  Alert and oriented, No acute distress. HEENT: [place] AT, moist mucus membranes.  Trachea midline, no masses. Cardiovascular: No clubbing, cyanosis, or edema. Respiratory: Normal respiratory effort, no increased work of breathing. Neurologic: Grossly intact, no focal deficits, moving all 4 extremities. Psychiatric: Normal mood and affect.   Assessment & Plan:    1.  History of urolithiasis  Hyperoxaluria, low urine pH, increased supersaturation calcium oxalate/uric acid  KUB today- will call with results  UA today to check urine pH  Follow-up 1 year with KUB  Instructed call earlier for worsening pain   07/19/2020, MD  [facility] [address] [place], [place] [phone]

## 2020-09-04 ENCOUNTER — Encounter: Payer: Self-pay | Admitting: Urology

## 2020-09-06 ENCOUNTER — Encounter: Payer: Self-pay | Admitting: *Deleted

## 2020-09-06 ENCOUNTER — Telehealth: Payer: Self-pay | Admitting: *Deleted

## 2020-09-06 NOTE — Telephone Encounter (Signed)
Notified patient as instructed, patient pleased. Discussed follow-up appointments, patient agrees  

## 2020-09-06 NOTE — Telephone Encounter (Signed)
-----   Message from Riki Altes, MD sent at 09/06/2020  3:38 PM EDT ----- KUB shows bilateral, nonobstructing renal calculi.  Recommend rescheduling her 1 year follow-up/KUB to a 74-month follow-up with KUB

## 2020-09-14 ENCOUNTER — Other Ambulatory Visit: Payer: Self-pay

## 2020-09-15 LAB — IRON AND TIBC
Iron Saturation: 20 % (ref 15–55)
Iron: 61 ug/dL (ref 27–159)
Total Iron Binding Capacity: 299 ug/dL (ref 250–450)
UIBC: 238 ug/dL (ref 131–425)

## 2020-09-15 LAB — FERRITIN: Ferritin: 227 ng/mL — ABNORMAL HIGH (ref 15–150)

## 2020-09-20 ENCOUNTER — Ambulatory Visit: Payer: Self-pay | Admitting: Gerontology

## 2020-09-20 ENCOUNTER — Encounter: Payer: Self-pay | Admitting: Gerontology

## 2020-09-20 ENCOUNTER — Other Ambulatory Visit: Payer: Self-pay

## 2020-09-20 ENCOUNTER — Ambulatory Visit: Payer: Self-pay | Admitting: Specialist

## 2020-09-20 VITALS — BP 133/67 | HR 85 | Temp 97.3°F | Resp 16 | Wt 277.9 lb

## 2020-09-20 DIAGNOSIS — M25512 Pain in left shoulder: Secondary | ICD-10-CM

## 2020-09-20 DIAGNOSIS — E1165 Type 2 diabetes mellitus with hyperglycemia: Secondary | ICD-10-CM

## 2020-09-20 DIAGNOSIS — Z794 Long term (current) use of insulin: Secondary | ICD-10-CM

## 2020-09-20 DIAGNOSIS — M545 Low back pain, unspecified: Secondary | ICD-10-CM

## 2020-09-20 DIAGNOSIS — I10 Essential (primary) hypertension: Secondary | ICD-10-CM

## 2020-09-20 DIAGNOSIS — Z0000 Encounter for general adult medical examination without abnormal findings: Secondary | ICD-10-CM

## 2020-09-20 DIAGNOSIS — R8290 Unspecified abnormal findings in urine: Secondary | ICD-10-CM

## 2020-09-20 DIAGNOSIS — R600 Localized edema: Secondary | ICD-10-CM

## 2020-09-20 MED ORDER — CHLORTHALIDONE 25 MG PO TABS
12.5000 mg | ORAL_TABLET | Freq: Every day | ORAL | 1 refills | Status: DC
  Filled 2020-09-20: qty 30, 60d supply, fill #0

## 2020-09-20 MED ORDER — INSULIN GLARGINE 100 UNIT/ML SOLOSTAR PEN
78.0000 [IU] | PEN_INJECTOR | Freq: Every day | SUBCUTANEOUS | 5 refills | Status: DC
  Filled 2020-09-20: qty 75, 96d supply, fill #0
  Filled 2020-12-30: qty 75, 97d supply, fill #0
  Filled 2021-02-08: qty 75, 97d supply, fill #1

## 2020-09-20 NOTE — Progress Notes (Signed)
Established Patient Office Visit  Subjective:  Patient ID: [name], female    DOB: May 23, 1975  Age: 45 y.o. MRN: [id number]  CC: No chief complaint on file.   HPI [name] a 45 y/o female who has a history of Depression, T2DM,GERD, History of Kidney stone, Hypertension, back pain, presents for follow up of T2DM and lab review. Her HgbA1c done on 08/31/20 decreased from 9.1% to 8.3%. She was seen by [facility] Endocrinology team on 08/30/20, it was recommended for her to continue on Victoza 1.8 mg in the morning, increase Lantus 2 units weekly if average fasting BG  >150 mg/dl. She reports checking her blood glucose bid, but didn't check this morning and it was 230 mg/dl when checked last night. She admits to taking 78 units of Lantus and will readjust next week. Her blood glucose was 259 mg/dl when checked during visit. She denies hypoglycemic symptoms, peripheral neuropathy, and admits to polyuria. She was seen today by [facility] Orthopedic Dr Vickki Hearing for chronic low back pain, and will follow up with X-Ray of Thoracic/Lumbar spine. She followed up with Urologist Dr Jonny Ruiz secondary to Nephrolithiasis on 09/02/20. She had  KUB done on 09/02/20 which showed bilateral non obstructing renal calculi and she will follow up another KUB in 6 months, will continue Potasium citrate bid. Her Ferritin level done on 09/14/20 was 227 ng/ml, her Ferrous sulfate was discontinued. Her Urinalysis done on 09/02/20 had Proteinuria and RBC 2+. She denies hematuria, dysuria,pelvic and flank pain. Overall, she states that she's doing well and offers no further complaint.   Past Medical History:  Diagnosis Date  . Depression   . Diabetes mellitus without complication ([name])   . GERD (gastroesophageal reflux disease)   . Headache   . History of kidney stones   . History of nephrolithiasis 2015   via CT imaging, including an obstructing stone  . Hyperlipidemia   . Hypertension 12/2019   per patient, she  has never been treated for high blood pressure  . Low back pain 12/2019  . Neuromuscular disorder ([place])    neuropathy    Past Surgical History:  Procedure Laterality Date  . CHOLECYSTECTOMY  1998  . CYSTOSCOPY WITH STENT PLACEMENT Right 12/09/2019   Procedure: CYSTOSCOPY WITH STENT PLACEMENT;  Surgeon: Abbie Sons, MD;  Location: [facility] ORS;  Service: Urology;  Laterality: Right;  . CYSTOSCOPY/RETROGRADE/URETEROSCOPY  12/29/2019   Procedure: CYSTOSCOPY/RETROGRADE/URETEROSCOPY;  Surgeon: Abbie Sons, MD;  Location: [facility] ORS;  Service: Urology;;    Family History  Problem Relation Age of Onset  . Kidney failure Father   . Pancreatic cancer Maternal Grandmother     Social History   Socioeconomic History  . Marital status: Single    Spouse name: Not on file  . Number of children: Not on file  . Years of education: Not on file  . Highest education level: Not on file  Occupational History  . Occupation: not currently working  Tobacco Use  . Smoking status: Never Smoker  . Smokeless tobacco: Never Used  Vaping Use  . Vaping Use: Never used  Substance and Sexual Activity  . Alcohol use: No  . Drug use: No  . Sexual activity: Never  Other Topics Concern  . Not on file  Social History Narrative   - Needs a blood sugar monitor       Patient said she is living with her mom and things are going ok. Mom provides for her food, transport, etc.  Patient wants to wait until next visit before any information is shared.    Social Determinants of Health   Financial Resource Strain: Not on file  Food Insecurity: Not on file  Transportation Needs: Not on file  Physical Activity: Not on file  Stress: Not on file  Social Connections: Not on file  Intimate Partner Violence: Not on file    Outpatient Medications Prior to Visit  Medication Sig Dispense Refill  . ibuprofen (ADVIL) 600 MG tablet Take 600 mg by mouth every 6 (six) hours as needed for mild pain or moderate  pain.    . Insulin Pen Needle 32G X 4 MM MISC Use as directed with insulin 100 each 11  . Insulin Syringe-Needle U-100 31G X 15/64" 1 ML MISC USE AS DIRECTED TO DRAW UP INSULIN 30 each 11  . liraglutide (VICTOZA) 18 MG/3ML SOPN INJECT 1.$RemoveBefor'8MG'SsmtPUrZwXPO$  UNDER THE SKIN EVERY MORNING 9 mL 5  . Potassium Citrate 15 MEQ (1620 MG) TBCR TAKE ONE TABLET BY MOUTH 2 TIMES A DAY WITH MEALS 60 tablet 11  . chlorthalidone (HYGROTON) 25 MG tablet Take 0.5 tablets (12.5 mg total) by mouth daily. 30 tablet 0  . ferrous sulfate 325 (65 FE) MG tablet Take 1 tablet (325 mg total) by mouth daily with breakfast. 30 tablet 2  . insulin glargine (LANTUS) 100 UNIT/ML Solostar Pen Inject 76 Units into the skin at bedtime. 75 mL 5  . OVER THE COUNTER MEDICATION Take 1 capsule by mouth daily. [facility] Gallbladder Care     No facility-administered medications prior to visit.    Allergies  Allergen Reactions  . Penicillins Hives  . Topiramate     Other reaction(s): Other (See Comments) Worsened migraines, N/V  . Topiramate Er Nausea And Vomiting    Intensified Migraines   . Valtrex [Valacyclovir] Itching    ROS Review of Systems  Constitutional: Negative.   Eyes: Negative.   Respiratory: Negative.   Cardiovascular: Negative.   Endocrine: Positive for polyuria.  Skin: Negative.   Neurological: Negative.   Psychiatric/Behavioral: Negative.       Objective:    Physical Exam HENT:     Head: Normocephalic and atraumatic.  Eyes:     Extraocular Movements: Extraocular movements intact.     Conjunctiva/sclera: Conjunctivae normal.     Pupils: Pupils are equal, round, and reactive to light.  Cardiovascular:     Rate and Rhythm: Normal rate and regular rhythm.     Pulses: Normal pulses.     Heart sounds: Normal heart sounds.  Pulmonary:     Effort: Pulmonary effort is normal.     Breath sounds: Normal breath sounds.  Abdominal:     Tenderness: There is no right CVA tenderness or left CVA tenderness.   Skin:    General: Skin is warm.  Neurological:     General: No focal deficit present.     Mental Status: She is alert and oriented to person, place, and time. Mental status is at baseline.  Psychiatric:        Mood and Affect: Mood normal.        Behavior: Behavior normal.        Thought Content: Thought content normal.        Judgment: Judgment normal.     BP 133/67 (BP Location: Left Arm, Patient Position: Sitting, Cuff Size: Large)   Pulse 85   Temp (!) 97.3 F (36.3 C)   Resp 16   Wt 277 lb 14.4 oz (126.1  kg)   LMP 05/21/2020 (Approximate)   SpO2 96%   BMI 42.25 kg/m  Wt Readings from Last 3 Encounters:  09/20/20 277 lb 14.4 oz (126.1 kg)  09/02/20 274 lb (124.3 kg)  08/30/20 281 lb 14.4 oz (127.9 kg)    Weight loss was encouraged  Health Maintenance Due  Topic Date Due  . PNEUMOCOCCAL POLYSACCHARIDE VACCINE AGE 71-64 HIGH RISK  Never done  . COVID-19 Vaccine (1) Never done  . Hepatitis C Screening  Never done  . TETANUS/TDAP  Never done  . PAP SMEAR-Modifier  Never done  . OPHTHALMOLOGY EXAM  07/04/2018  . COLONOSCOPY (Pts 45-12yrs Insurance coverage will need to be confirmed)  Never done    There are no preventive care reminders to display for this patient.  Lab Results  Component Value Date   TSH 2.130 08/31/2020   Lab Results  Component Value Date   WBC 8.1 08/31/2020   HGB 12.0 08/31/2020   HCT 36.9 08/31/2020   MCV 75 (L) 08/31/2020   PLT 412 08/31/2020   Lab Results  Component Value Date   NA 138 08/31/2020   K 4.1 08/31/2020   CO2 23 08/31/2020   GLUCOSE 224 (H) 08/31/2020   BUN 13 08/31/2020   CREATININE 0.84 08/31/2020   BILITOT 0.4 08/31/2020   ALKPHOS 134 (H) 08/31/2020   AST 12 08/31/2020   ALT 15 08/31/2020   PROT 7.9 08/31/2020   ALBUMIN 4.4 08/31/2020   CALCIUM 9.9 08/31/2020   ANIONGAP 8 08/09/2020   EGFR 86 08/31/2020   Lab Results  Component Value Date   CHOL 142 11/11/2019   Lab Results  Component Value Date    HDL 32 (L) 11/11/2019   Lab Results  Component Value Date   LDLCALC 94 11/11/2019   Lab Results  Component Value Date   TRIG 84 11/11/2019   Lab Results  Component Value Date   CHOLHDL 4.4 11/11/2019   Lab Results  Component Value Date   HGBA1C 8.3 (H) 08/31/2020      Assessment & Plan:   1. Essential hypertension -Her blood pressure is under control and she will continue on current medication, DASH diet and exercise as tolerated. - chlorthalidone (HYGROTON) 25 MG tablet; Take 0.5 tablets (12.5 mg total) by mouth daily.  Dispense: 30 tablet; Refill: 1  3. Type 2 diabetes mellitus with hyperglycemia, with long-term current use of insulin (HCC) -Her HgbA1c was 8.3% and her goal should be less than 7%. She was encouraged to check and document her blood glucose reading daily and adjust her Lantus accordingly.She will continue on low carb/non concentrated sweet diet and exercise as tolerated. - insulin glargine (LANTUS) 100 UNIT/ML Solostar Pen; Inject 78 Units into the skin at bedtime.  Dispense: 75 mL; Refill: 5 - Ambulatory referral to Ophthalmology - HgB A1c; Future - Urinalysis; Future - POCT Glucose (CBG); Future  4. Health care maintenance - She will follow up with Gastroenterology for Colonoscopy screening. - Ambulatory referral to Gastroenterology  5. Abnormal finding on urinalysis -Her Urinalysis has 2+ Protein and RBC, will recheck in future, was encouraged to increase water intake and follow up with Urology as scheduled.     Follow-up: Return in about 3 months (around 12/07/2020), or if symptoms worsen or fail to improve.    Hannah Cid Jerold Coombe, NP

## 2020-09-20 NOTE — Progress Notes (Signed)
Ms. [name] did not have her x-rays as the hospital said they did not have the appropriate paperwork. We will try to figure out where the error occurred so we can get the x-rays and see her at the next clinic.

## 2020-09-20 NOTE — Patient Instructions (Signed)

## 2020-09-21 ENCOUNTER — Ambulatory Visit: Payer: Self-pay | Admitting: Gerontology

## 2020-09-22 ENCOUNTER — Ambulatory Visit: Payer: Self-pay | Admitting: Nurse Practitioner

## 2020-09-23 ENCOUNTER — Ambulatory Visit
Admission: RE | Admit: 2020-09-23 | Discharge: 2020-09-23 | Disposition: A | Discharge status: Home / Self Care | Payer: Self-pay | Attending: Specialist | Admitting: Specialist

## 2020-09-23 ENCOUNTER — Ambulatory Visit
Admission: RE | Admit: 2020-09-23 | Discharge: 2020-09-23 | Disposition: A | Discharge status: Home / Self Care | Payer: Self-pay | Source: Ambulatory Visit | Attending: Gerontology | Admitting: Gerontology

## 2020-09-23 ENCOUNTER — Other Ambulatory Visit: Payer: Self-pay

## 2020-09-23 DIAGNOSIS — M545 Low back pain, unspecified: Secondary | ICD-10-CM | POA: Insufficient documentation

## 2020-09-27 ENCOUNTER — Ambulatory Visit: Payer: Self-pay

## 2020-10-07 ENCOUNTER — Other Ambulatory Visit: Payer: Self-pay

## 2020-10-07 ENCOUNTER — Ambulatory Visit (INDEPENDENT_AMBULATORY_CARE_PROVIDER_SITE_OTHER): Payer: Self-pay | Admitting: Cardiology

## 2020-10-07 ENCOUNTER — Encounter: Payer: Self-pay | Admitting: Cardiology

## 2020-10-07 VITALS — BP 100/70 | HR 82 | Ht 68.0 in | Wt 280.0 lb

## 2020-10-07 DIAGNOSIS — I10 Essential (primary) hypertension: Secondary | ICD-10-CM

## 2020-10-07 DIAGNOSIS — R079 Chest pain, unspecified: Secondary | ICD-10-CM

## 2020-10-07 MED FILL — Potassium Citrate Tab ER 15 MEQ (1620 MG): ORAL | 30 days supply | Qty: 60 | Fill #0 | Status: CN

## 2020-10-07 NOTE — Progress Notes (Signed)
Cardiology Office Note:    Date:  10/07/2020   ID:  [name], DOB [birth year], MRN [id number]  PCP:  Rolm GalaIloabachie, [name], NP   [facility] Providers Cardiologist:  None     Referring MD: Rolm GalaIloabachie, [name], NP   Chief Complaint  Patient presents with  . New Patient (Initial Visit)    Referred by PCP for Brandycardia. Patient c.o bilateral arm pain that goes into chest. Meds reviewed verbally with patient.     History of Present Illness:    [name] is a 45 y.o. female with a hx of hypertension, diabetes, obesity who presents due to chest pain.  Patient states having chest discomfort on and off over the past 3 months.  Symptoms are not associated with exertion.  Has bilateral arm pain radiating to the chest.  Sometimes moving her arms causes discomfort.  Denies any history of heart disease.  Denies shortness of breath, palpitations.  Past Medical History:  Diagnosis Date  . Depression   . Diabetes mellitus without complication (HCC)   . GERD (gastroesophageal reflux disease)   . Headache   . History of kidney stones   . History of nephrolithiasis 2015   via CT imaging, including an obstructing stone  . Hyperlipidemia   . Hypertension 12/2019   per patient, she has never been treated for high blood pressure  . Low back pain 12/2019  . Neuromuscular disorder (HCC)    neuropathy    Past Surgical History:  Procedure Laterality Date  . CHOLECYSTECTOMY  1998  . CYSTOSCOPY WITH STENT PLACEMENT Right 12/09/2019   Procedure: CYSTOSCOPY WITH STENT PLACEMENT;  Surgeon: Riki AltesStoioff, Scott C, MD;  Location: [facility] ORS;  Service: Urology;  Laterality: Right;  . CYSTOSCOPY/RETROGRADE/URETEROSCOPY  12/29/2019   Procedure: CYSTOSCOPY/RETROGRADE/URETEROSCOPY;  Surgeon: Riki AltesStoioff, Scott C, MD;  Location: [facility] ORS;  Service: Urology;;    Current Medications: Current Meds  Medication Sig  . chlorthalidone (HYGROTON) 25 MG tablet Take 0.5 tablets (12.5 mg total) by  mouth daily.  Marland Kitchen. ibuprofen (ADVIL) 600 MG tablet Take 600 mg by mouth every 6 (six) hours as needed for mild pain or moderate pain.  Marland Kitchen. insulin glargine (LANTUS) 100 UNIT/ML Solostar Pen Inject 78 Units into the skin at bedtime.  . Insulin Pen Needle 32G X 4 MM MISC Use as directed with insulin  . Insulin Syringe-Needle U-100 31G X 15/64" 1 ML MISC USE AS DIRECTED TO DRAW UP INSULIN  . liraglutide (VICTOZA) 18 MG/3ML SOPN INJECT 1.8MG  UNDER THE SKIN EVERY MORNING  . OVER THE COUNTER MEDICATION Take 1 capsule by mouth daily. [employer] [employer] Gallbladder Care  . Potassium Citrate 15 MEQ (1620 MG) TBCR TAKE ONE TABLET BY MOUTH 2 TIMES A DAY WITH MEALS     Allergies:   Penicillins, Topiramate, Topiramate er, and Valtrex [valacyclovir]   Social History   Socioeconomic History  . Marital status: Single    Spouse name: Not on file  . Number of children: Not on file  . Years of education: Not on file  . Highest education level: Not on file  Occupational History  . Occupation: not currently working  Tobacco Use  . Smoking status: Never Smoker  . Smokeless tobacco: Never Used  Vaping Use  . Vaping Use: Never used  Substance and Sexual Activity  . Alcohol use: No  . Drug use: No  . Sexual activity: Never  Other Topics Concern  . Not on file  Social History Narrative   - Needs a  blood sugar monitor       Patient said she is living with her mom and things are going ok. Mom provides for her food, transport, etc.       Patient wants to wait until next visit before any information is shared.    Social Determinants of Health   Financial Resource Strain: Not on file  Food Insecurity: Not on file  Transportation Needs: Not on file  Physical Activity: Not on file  Stress: Not on file  Social Connections: Not on file     Family History: The patient's family history includes Kidney failure in her father; Pancreatic cancer in her maternal grandmother.  ROS:   Please see the history of  present illness.     All other systems reviewed and are negative.  EKGs/Labs/Other Studies Reviewed:    The following studies were reviewed today:   EKG:  EKG is  ordered today.  The ekg ordered today demonstrates sinus rhythm, occasional PVCs  Recent Labs: 03/23/2020: BNP 18.2 08/31/2020: ALT 15; BUN 13; Creatinine, Ser 0.84; Hemoglobin 12.0; Magnesium 1.7; Platelets 412; Potassium 4.1; Sodium 138; TSH 2.130  Recent Lipid Panel    Component Value Date/Time   CHOL 142 11/11/2019 1254   TRIG 84 11/11/2019 1254   HDL 32 (L) 11/11/2019 1254   CHOLHDL 4.4 11/11/2019 1254   CHOLHDL 4.3 03/29/2007 0615   VLDL 17 03/29/2007 0615   LDLCALC 94 11/11/2019 1254     Risk Assessment/Calculations:      Physical Exam:    VS:  BP 100/70 (BP Location: Left Arm, Patient Position: Sitting, Cuff Size: Large)   Pulse 82   Ht 5\' 8"  (1.727 m)   Wt 280 lb (127 kg)   LMP 05/21/2020 (Approximate)   SpO2 98%   BMI 42.57 kg/m     Wt Readings from Last 3 Encounters:  10/07/20 280 lb (127 kg)  09/20/20 277 lb 14.4 oz (126.1 kg)  09/02/20 274 lb (124.3 kg)     GEN:  Well nourished, well developed in no acute distress HEENT: Normal NECK: No JVD; No carotid bruits LYMPHATICS: No lymphadenopathy CARDIAC: RRR, no murmurs, rubs, gallops RESPIRATORY:  Clear to auscultation without rales, wheezing or rhonchi  ABDOMEN: Soft, non-tender, non-distended MUSCULOSKELETAL:  No edema; left chest tenderness with palpation, right shoulders with tender with movements. SKIN: Warm and dry NEUROLOGIC:  Alert and oriented x 3 PSYCHIATRIC:  Normal affect   ASSESSMENT:    1. Chest pain, unspecified type   2. Primary hypertension   3. Morbid obesity (HCC)    PLAN:    In order of problems listed above:  1. Chest pain, appears musculoskeletal in origin.  Has risk factors of obesity, diabetes, hypertension.  Obtain echocardiogram to evaluate overall function.  Management of musculoskeletal pain as per  primary care physician.  I do not believe her symptoms are secondary to ischemia/CAD. 2. Hypertension, BP controlled.  Continue BP meds 3. Morbid obesity, low calorie diet advised.   Follow-up after echocardiogram   Medication Adjustments/Labs and Tests Ordered: Current medicines are reviewed at length with the patient today.  Concerns regarding medicines are outlined above.  Orders Placed This Encounter  Procedures  . EKG 12-Lead  . ECHOCARDIOGRAM COMPLETE   No orders of the defined types were placed in this encounter.   Patient Instructions   Medication Instructions:  - Your physician recommends that you continue on your current medications as directed. Please refer to the Current Medication list given to you today.  *  If you need a refill on your cardiac medications before your next appointment, please call your pharmacy*   Lab Work: - none ordered  If you have labs (blood work) drawn today and your tests are completely normal, you will receive your results only by: Marland Kitchen MyChart Message (if you have MyChart) OR . A paper copy in the mail If you have any lab test that is abnormal or we need to change your treatment, we will call you to review the results.   Testing/Procedures:  - Your physician has requested that you have an echocardiogram. Echocardiography is a painless test that uses sound waves to create images of your heart. It provides your doctor with information about the size and shape of your heart and how well your heart's chambers and valves are working. This procedure takes approximately one hour. There are no restrictions for this procedure. There is a possibility that an IV may need to be started during your test to inject an image enhancing agent. This is done to obtain more optimal pictures of your heart. Therefore we ask that you do at least drink some water prior to coming in to hydrate your veins.    Follow-Up: At [facility], you and your health needs are  our priority.  As part of our continuing mission to provide you with exceptional heart care, we have created designated Provider Care Teams.  These Care Teams include your primary Cardiologist (physician) and Advanced Practice Providers (APPs -  Physician Assistants and Nurse Practitioners) who all work together to provide you with the care you need, when you need it.  We recommend signing up for the patient portal called "MyChart".  Sign up information is provided on this After Visit Summary.  MyChart is used to connect with patients for Virtual Visits (Telemedicine).  Patients are able to view lab/test results, encounter notes, upcoming appointments, etc.  Non-urgent messages can be sent to your provider as well.   To learn more about what you can do with MyChart, go to [email or web address].    Your next appointment:   After the echocardiogram is completed   The format for your next appointment:   In Person  Provider:   You may see Debbe Odea, MD or one of the following Advanced Practice Providers on your designated Care Team:    Nicolasa Ducking, NP  Eula Listen, PA-C  Marisue Ivan, PA-C  Cadence Tynan, [place]  Gillian Shields, NP    Other Instructions   Echocardiogram An echocardiogram is a test that uses sound waves (ultrasound) to produce images of the heart. Images from an echocardiogram can provide important information about:  Heart size and shape.  The size and thickness and movement of your heart's walls.  Heart muscle function and strength.  Heart valve function or if you have stenosis. Stenosis is when the heart valves are too narrow.  If blood is flowing backward through the heart valves (regurgitation).  A tumor or infectious growth around the heart valves.  Areas of heart muscle that are not working well because of poor blood flow or injury from a heart attack.  Aneurysm detection. An aneurysm is a weak or damaged part of an artery wall. The  wall bulges out from the normal force of blood pumping through the body. Tell a health care provider about:  Any allergies you have.  All medicines you are taking, including vitamins, herbs, eye drops, creams, and over-the-counter medicines.  Any blood disorders you have.  Any surgeries you  have had.  Any medical conditions you have.  Whether you are pregnant or may be pregnant. What are the risks? Generally, this is a safe test. However, problems may occur, including an allergic reaction to dye (contrast) that may be used during the test. What happens before the test? No specific preparation is needed. You may eat and drink normally. What happens during the test?  You will take off your clothes from the waist up and put on a hospital gown.  Electrodes or electrocardiogram (ECG)patches may be placed on your chest. The electrodes or patches are then connected to a device that monitors your heart rate and rhythm.  You will lie down on a table for an ultrasound exam. A gel will be applied to your chest to help sound waves pass through your skin.  A handheld device, called a transducer, will be pressed against your chest and moved over your heart. The transducer produces sound waves that travel to your heart and bounce back (or "echo" back) to the transducer. These sound waves will be captured in real-time and changed into images of your heart that can be viewed on a video monitor. The images will be recorded on a computer and reviewed by your health care provider.  You may be asked to change positions or hold your breath for a short time. This makes it easier to get different views or better views of your heart.  In some cases, you may receive contrast through an IV in one of your veins. This can improve the quality of the pictures from your heart. The procedure may vary among health care providers and hospitals.   What can I expect after the test? You may return to your normal,  everyday life, including diet, activities, and medicines, unless your health care provider tells you not to do that. Follow these instructions at home:  It is up to you to get the results of your test. Ask your health care provider, or the department that is doing the test, when your results will be ready.  Keep all follow-up visits. This is important. Summary  An echocardiogram is a test that uses sound waves (ultrasound) to produce images of the heart.  Images from an echocardiogram can provide important information about the size and shape of your heart, heart muscle function, heart valve function, and other possible heart problems.  You do not need to do anything to prepare before this test. You may eat and drink normally.  After the echocardiogram is completed, you may return to your normal, everyday life, unless your health care provider tells you not to do that. This information is not intended to replace advice given to you by your health care provider. Make sure you discuss any questions you have with your health care provider. Document Revised: 12/22/2019 Document Reviewed: 12/22/2019 Elsevier Patient Education  2021 Elsevier Inc.      Signed, Debbe Odea, MD  10/07/2020 12:01 PM    [facility]

## 2020-10-07 NOTE — Patient Instructions (Signed)
Medication Instructions:  - Your physician recommends that you continue on your current medications as directed. Please refer to the Current Medication list given to you today.  *If you need a refill on your cardiac medications before your next appointment, please call your pharmacy*   Lab Work: - none ordered  If you have labs (blood work) drawn today and your tests are completely normal, you will receive your results only by: Marland Kitchen MyChart Message (if you have MyChart) OR . A paper copy in the mail If you have any lab test that is abnormal or we need to change your treatment, we will call you to review the results.   Testing/Procedures:  - Your physician has requested that you have an echocardiogram. Echocardiography is a painless test that uses sound waves to create images of your heart. It provides your doctor with information about the size and shape of your heart and how well your heart's chambers and valves are working. This procedure takes approximately one hour. There are no restrictions for this procedure. There is a possibility that an IV may need to be started during your test to inject an image enhancing agent. This is done to obtain more optimal pictures of your heart. Therefore we ask that you do at least drink some water prior to coming in to hydrate your veins.    Follow-Up: At [facility], you and your health needs are our priority.  As part of our continuing mission to provide you with exceptional heart care, we have created designated Provider Care Teams.  These Care Teams include your primary Cardiologist (physician) and Advanced Practice Providers (APPs -  Physician Assistants and Nurse Practitioners) who all work together to provide you with the care you need, when you need it.  We recommend signing up for the patient portal called "MyChart".  Sign up information is provided on this After Visit Summary.  MyChart is used to connect with patients for Virtual Visits  (Telemedicine).  Patients are able to view lab/test results, encounter notes, upcoming appointments, etc.  Non-urgent messages can be sent to your provider as well.   To learn more about what you can do with MyChart, go to [email or web address].    Your next appointment:   After the echocardiogram is completed   The format for your next appointment:   In Person  Provider:   You may see Debbe Odea, MD or one of the following Advanced Practice Providers on your designated Care Team:    Nicolasa Ducking, NP  Eula Listen, PA-C  Marisue Ivan, PA-C  Cadence Woodruff, [place]  Gillian Shields, NP    Other Instructions   Echocardiogram An echocardiogram is a test that uses sound waves (ultrasound) to produce images of the heart. Images from an echocardiogram can provide important information about:  Heart size and shape.  The size and thickness and movement of your heart's walls.  Heart muscle function and strength.  Heart valve function or if you have stenosis. Stenosis is when the heart valves are too narrow.  If blood is flowing backward through the heart valves (regurgitation).  A tumor or infectious growth around the heart valves.  Areas of heart muscle that are not working well because of poor blood flow or injury from a heart attack.  Aneurysm detection. An aneurysm is a weak or damaged part of an artery wall. The wall bulges out from the normal force of blood pumping through the body. Tell a health care provider about:  Any allergies  you have.  All medicines you are taking, including vitamins, herbs, eye drops, creams, and over-the-counter medicines.  Any blood disorders you have.  Any surgeries you have had.  Any medical conditions you have.  Whether you are pregnant or may be pregnant. What are the risks? Generally, this is a safe test. However, problems may occur, including an allergic reaction to dye (contrast) that may be used during the  test. What happens before the test? No specific preparation is needed. You may eat and drink normally. What happens during the test?  You will take off your clothes from the waist up and put on a hospital gown.  Electrodes or electrocardiogram (ECG)patches may be placed on your chest. The electrodes or patches are then connected to a device that monitors your heart rate and rhythm.  You will lie down on a table for an ultrasound exam. A gel will be applied to your chest to help sound waves pass through your skin.  A handheld device, called a transducer, will be pressed against your chest and moved over your heart. The transducer produces sound waves that travel to your heart and bounce back (or "echo" back) to the transducer. These sound waves will be captured in real-time and changed into images of your heart that can be viewed on a video monitor. The images will be recorded on a computer and reviewed by your health care provider.  You may be asked to change positions or hold your breath for a short time. This makes it easier to get different views or better views of your heart.  In some cases, you may receive contrast through an IV in one of your veins. This can improve the quality of the pictures from your heart. The procedure may vary among health care providers and hospitals.   What can I expect after the test? You may return to your normal, everyday life, including diet, activities, and medicines, unless your health care provider tells you not to do that. Follow these instructions at home:  It is up to you to get the results of your test. Ask your health care provider, or the department that is doing the test, when your results will be ready.  Keep all follow-up visits. This is important. Summary  An echocardiogram is a test that uses sound waves (ultrasound) to produce images of the heart.  Images from an echocardiogram can provide important information about the size and shape of  your heart, heart muscle function, heart valve function, and other possible heart problems.  You do not need to do anything to prepare before this test. You may eat and drink normally.  After the echocardiogram is completed, you may return to your normal, everyday life, unless your health care provider tells you not to do that. This information is not intended to replace advice given to you by your health care provider. Make sure you discuss any questions you have with your health care provider. Document Revised: 12/22/2019 Document Reviewed: 12/22/2019 Elsevier Patient Education  2021 [employer].

## 2020-10-11 ENCOUNTER — Other Ambulatory Visit: Payer: Self-pay

## 2020-10-17 ENCOUNTER — Other Ambulatory Visit: Payer: Self-pay

## 2020-10-17 MED FILL — Potassium Citrate Tab ER 15 MEQ (1620 MG): ORAL | 30 days supply | Qty: 60 | Fill #0 | Status: CN

## 2020-10-19 ENCOUNTER — Telehealth: Payer: Self-pay | Admitting: Pharmacist

## 2020-10-19 ENCOUNTER — Other Ambulatory Visit: Payer: Self-pay

## 2020-10-19 MED ORDER — POTASSIUM CITRATE ER 10 MEQ (1080 MG) PO TBCR
10.0000 meq | EXTENDED_RELEASE_TABLET | Freq: Two times a day (BID) | ORAL | 11 refills | Status: DC
  Filled 2020-10-19: qty 100, 50d supply, fill #0

## 2020-10-19 NOTE — Telephone Encounter (Signed)
Patient approved for medication assistance at [facility] until 09/11/21, as long as eligibility criteria continues to be met.   Vonda [facility] Administrative Assistant 

## 2020-10-21 ENCOUNTER — Telehealth: Payer: Self-pay | Admitting: Pharmacist

## 2020-10-21 ENCOUNTER — Other Ambulatory Visit: Payer: Self-pay

## 2020-10-21 NOTE — Telephone Encounter (Signed)
10/21/2020 12:30:21 PM - Lantus Solostar pending  -- [name] - Friday, October 21, 2020 12:29 PM --Mailed patient her portion of [employer] renewal for [employer] on 10/13/2020 and patient has not returned. The provider has signed their portion on 10/13/20.

## 2020-10-24 ENCOUNTER — Other Ambulatory Visit: Payer: Self-pay

## 2020-10-24 MED ORDER — NOVOFINE PLUS PEN NEEDLE 32G X 4 MM MISC
99 refills | Status: DC
  Filled 2020-10-24: qty 200, 200d supply, fill #0

## 2020-10-24 MED FILL — Liraglutide Soln Pen-injector 18 MG/3ML (6 MG/ML): SUBCUTANEOUS | 120 days supply | Qty: 36 | Fill #0 | Status: CN

## 2020-10-25 ENCOUNTER — Other Ambulatory Visit: Payer: Self-pay

## 2020-10-25 MED ORDER — NOVOFINE PEN NEEDLE 32G X 6 MM MISC
99 refills | Status: DC
  Filled 2020-10-25: qty 200, 100d supply, fill #0
  Filled 2021-05-29: qty 200, fill #0
  Filled 2021-08-15: qty 400, 100d supply, fill #0

## 2020-11-01 ENCOUNTER — Other Ambulatory Visit: Payer: Self-pay

## 2020-11-01 ENCOUNTER — Ambulatory Visit: Payer: Self-pay | Admitting: Endocrinology

## 2020-11-01 VITALS — BP 134/79 | HR 86 | Temp 97.4°F | Ht 68.0 in | Wt 282.0 lb

## 2020-11-01 DIAGNOSIS — E1165 Type 2 diabetes mellitus with hyperglycemia: Secondary | ICD-10-CM

## 2020-11-01 DIAGNOSIS — G629 Polyneuropathy, unspecified: Secondary | ICD-10-CM

## 2020-11-01 DIAGNOSIS — I10 Essential (primary) hypertension: Secondary | ICD-10-CM

## 2020-11-01 DIAGNOSIS — N179 Acute kidney failure, unspecified: Secondary | ICD-10-CM

## 2020-11-01 DIAGNOSIS — Z794 Long term (current) use of insulin: Secondary | ICD-10-CM

## 2020-11-01 MED ORDER — TRULICITY 4.5 MG/0.5ML ~~LOC~~ SOAJ
4.5000 mg | SUBCUTANEOUS | 4 refills | Status: DC
  Filled 2020-11-01: qty 6, 84d supply, fill #0
  Filled 2021-02-08: qty 8, 112d supply, fill #0

## 2020-11-01 MED ORDER — TRULICITY 1.5 MG/0.5ML ~~LOC~~ SOAJ
1.5000 mg | SUBCUTANEOUS | 0 refills | Status: DC
Start: 2020-11-01 — End: 2021-01-18
  Filled 2020-11-01 – 2020-12-09 (×2): qty 2, 28d supply, fill #0

## 2020-11-01 MED ORDER — TRULICITY 1.5 MG/0.5ML ~~LOC~~ SOAJ
3.0000 mg | SUBCUTANEOUS | 0 refills | Status: DC
  Filled 2020-11-01 – 2020-12-30 (×2): qty 2, 28d supply, fill #0
  Filled 2021-01-09: qty 4, 28d supply, fill #0
  Filled 2021-01-09: qty 2, 28d supply, fill #0

## 2020-11-01 NOTE — Patient Instructions (Signed)
Switch Victoza to Trulicity the day after you are done.  Take it once a week.  For the first month you will take 1.5 mg a week.  Then for second month if you do not have persistent nausea and vomiting, you will do 3 mg a week.  In third month you will take 4.5 mg a week.    If your blood sugars come down, reduce the dose of Lantus with an aim to avoid sugars <80 and keep most <130.    Great work

## 2020-11-01 NOTE — Progress Notes (Signed)
Patient Instructions  Switch Victoza to Trulicity the day after you are done.  Take it once a week.  For the first month you will take 1.5 mg a week.  Then for second month if you do not have persistent nausea and vomiting, you will do 3 mg a week.  In third month you will take 4.5 mg a week.    If your blood sugars come down, reduce the dose of Lantus with an aim to avoid sugars <80 and keep most <130.    Great work

## 2020-11-02 ENCOUNTER — Other Ambulatory Visit: Payer: Self-pay

## 2020-11-02 NOTE — Progress Notes (Signed)
Patient Instructions  Switch Victoza to Trulicity the day after you are done.  Take it once a week.  For the first month you will take 1.5 mg a week.  Then for second month if you do not have persistent nausea and vomiting, you will do 3 mg a week.  In third month you will take 4.5 mg a week.    If your blood sugars come down, reduce the dose of Lantus with an aim to avoid sugars <80 and keep most <130.    Great work   Follow up Diabetes/ Endocrine [facility]     Patient ID: [name], female   DOB: [birth year], 45 y.o.   MRN: [id number] Assessment:  [name] is a 45 y.o. female who is seen in follow up for Type 2 diabetes mellitus with hyperglycemia, with long-term current use of insulin (HCC) [E11.65, Z79.4] at the request of [name], [name], NP. Patient has been improving, though she is currently not at goal with treatment (HbA1c of 8.3 on 08/31/2020 from 9.1 on 06/29/2020).  Encounter Diagnoses 1. Type 2 diabetes mellitus with hyperglycemia, with long-term current use of insulin (HCC)   2. Essential hypertension   3. Peripheral polyneuropathy   4. AKI (acute kidney injury) (HCC)       Plan:    T2DM -Continue 78 mg insulin glargine (Lantus) once daily. -Continue 1.8 mg liraglutide (Victoza) once daily until patient's medication supply is used up.  Patient was counseled on the benefit of not refrigerating her Victoza pen to avoid the burning sensation she reports with injection. -(Once done with Victoza) Start 1.5 mg Trulicity once weekly.  Patient was counseled on the benefit of this once weekly injection. -Follow up in 4 months for routine T2DM management and assessment of BG levels on new medication (Trulicity). -Patient was also counseled on the benefit of adding an SGLT-2 inhibitor to her treatment regimen to reduce the risk of diabetic nephropathy and reduce her BG levels, and on the increased risk of UTI. Patient expressed hesitancy to start  the new medication and agreed to revisit this option at the next [facility] visit and try Trulicity for now.      Patient Instructions  Switch Victoza to Trulicity the day after you are done.  Take it once a week.  For the first month you will take 1.5 mg a week.  Then for second month if you do not have persistent nausea and vomiting, you will do 3 mg a week.  In third month you will take 4.5 mg a week.    If your blood sugars come down, reduce the dose of Lantus with an aim to avoid sugars <80 and keep most <130.    Great work   No orders of the defined types were placed in this encounter.    Subjective:  Patient is a 45 year old female with a history of T2DM, HTN, hyperlipidemia, nephrolithiasis, depression, and chronic low back pain, who presents for routine DM management. She measures her BG BID and reports fasting levels around 250 for the past couple of weeks. She manages her DM with insulin glargine (Lantus) 78 units once daily and liraglutide (Vicatoza) 1.8 mg once daily. She reports some burning sensation at the injection site with her Victoza pen, a sensation she did not experience when using the vial. However, patient voiced preference for the pen. She does not endorse other medication side effects. She expressed some frustration with the medication dispensing system, which she  associates with her refill gaps and occasional missed doses for her DM and other medications (potassium citrate).  Patient endorses polydipsia and polyuria; numbness in her hands and wrists, and twitching in he right hand fingers (symptoms with same intensity as last reported per last [facility] visit). She also reports swelling in both feet, with right foot swelling persisting for days despite elevating her feet when sleeping. She denies symptoms of hypoglycemia and foot health concerns.   Patient reports being prescribed eyeglasses for "distance reading" at her recent ophthalmologist visit (she has yet to receive them).  Patient's last foot exam was done at her most recent [facility] visit.  Patient reports eating more fast food for the past few days because of issues with her stove. She states that her high BG readings might be associated with this change in her diet.    Review of Systems  Constitutional:  Negative for unexpected weight change.  Endocrine: Positive for polydipsia and polyuria.  Musculoskeletal:  Positive for back pain and joint swelling.  Neurological:  Positive for numbness.  Patient reports hip pain.  [name]  has a past medical history of Depression, Diabetes mellitus without complication (HCC), GERD (gastroesophageal reflux disease), Headache, History of kidney stones, History of nephrolithiasis (2015), Hyperlipidemia, Hypertension (12/2019), Low back pain (12/2019), and Neuromuscular disorder (HCC).  Family History, Social History, current Medications and allergies reviewed and updated in Epic.  Objective:    Blood pressure 134/79, pulse 86, temperature (!) 97.4 F (36.3 C), temperature source Temporal, height 5\' 8"  (1.727 m), weight 282 lb (127.9 kg), last menstrual period 05/21/2020, SpO2 97 %. Physical Exam Constitutional:      Appearance: Normal appearance.  Cardiovascular:     Rate and Rhythm: Normal rate and regular rhythm.     Heart sounds: Normal heart sounds.  Pulmonary:     Effort: Pulmonary effort is normal.     Breath sounds: Normal breath sounds.  Musculoskeletal:        General: Swelling present.     Right lower leg: Edema present.     Left lower leg: Edema present.  Neurological:     Mental Status: She is alert.        Data : I have personally reviewed pertinent labs and imaging studies, if indicated,  with the patient in clinic today.   Lab Orders  No laboratory test(s) ordered today    HC Readings from Last 3 Encounters:  No data found for [facility]    Wt Readings from Last 3 Encounters:  11/01/20 282 lb (127.9 kg)  10/07/20 280 lb (127 kg)   09/20/20 277 lb 14.4 oz (126.1 kg)

## 2020-11-03 ENCOUNTER — Telehealth: Payer: Self-pay | Admitting: Pharmacist

## 2020-11-03 NOTE — Telephone Encounter (Signed)
11/03/2020 3:44:34 PM - Trulicity 3mg  & 4.5mg  to pt & dr  -- - Thursday, November 03, 2020 3:40 PM --Received pharmacy printout for Trulicity (see below on doses & instructions) Per note in [facility] Trulicity is replacing Victoza. Trulicity Inject 1.5mg  once a week for 4 weeks (SAMPLE GIVEN) Trulicity Inject 3mg  once a week for 1 month # 1 box  0 refills Trulicity Inject 4.5mg  once a week thereafter # 4 boxes  3 refills Printed applications and put patient portion in bag on the wall with (1.5mg  sample) Sending provider portion to [facility].   11/03/2020 3:21:10 PM - TRULICITY REPLACES VICTOZA 11/03/20

## 2020-11-09 ENCOUNTER — Other Ambulatory Visit: Payer: Self-pay

## 2020-11-23 ENCOUNTER — Other Ambulatory Visit: Payer: Self-pay

## 2020-11-25 ENCOUNTER — Other Ambulatory Visit: Payer: Self-pay

## 2020-11-25 ENCOUNTER — Ambulatory Visit (INDEPENDENT_AMBULATORY_CARE_PROVIDER_SITE_OTHER): Payer: Self-pay

## 2020-11-25 ENCOUNTER — Ambulatory Visit: Payer: Self-pay | Admitting: Cardiology

## 2020-11-25 DIAGNOSIS — R079 Chest pain, unspecified: Secondary | ICD-10-CM

## 2020-11-25 LAB — ECHOCARDIOGRAM COMPLETE
AR max vel: 2.09 cm2
AV Area VTI: 2.35 cm2
AV Area mean vel: 2.15 cm2
AV Mean grad: 5 mmHg
AV Peak grad: 9.2 mmHg
Ao pk vel: 1.52 m/s
S' Lateral: 3.5 cm

## 2020-11-25 MED ORDER — PERFLUTREN LIPID MICROSPHERE
1.0000 mL | INTRAVENOUS | Status: AC | PRN
  Administered 2020-11-25: 2 mL via INTRAVENOUS

## 2020-11-30 ENCOUNTER — Other Ambulatory Visit: Payer: Self-pay

## 2020-12-01 ENCOUNTER — Other Ambulatory Visit: Payer: Self-pay

## 2020-12-02 ENCOUNTER — Telehealth: Payer: Self-pay

## 2020-12-02 NOTE — Telephone Encounter (Signed)
Called and left a VM for patient to call back so that I could give her the following result note:  Echo shows normal systolic function, no gross structural abnormalities tosuggest etiology of chest pain.

## 2020-12-07 ENCOUNTER — Other Ambulatory Visit: Payer: Self-pay

## 2020-12-07 ENCOUNTER — Ambulatory Visit: Payer: Self-pay | Admitting: Gerontology

## 2020-12-07 DIAGNOSIS — E1165 Type 2 diabetes mellitus with hyperglycemia: Secondary | ICD-10-CM

## 2020-12-07 NOTE — Telephone Encounter (Signed)
Unable to leave a message, sending normal result letter to patients home address.

## 2020-12-08 ENCOUNTER — Telehealth: Payer: Self-pay | Admitting: Pharmacist

## 2020-12-08 ENCOUNTER — Other Ambulatory Visit: Payer: Self-pay

## 2020-12-08 LAB — URINALYSIS
Bilirubin, UA: NEGATIVE
Ketones, UA: NEGATIVE
Leukocytes,UA: NEGATIVE
Nitrite, UA: NEGATIVE
Protein,UA: NEGATIVE
RBC, UA: NEGATIVE
Specific Gravity, UA: 1.026 (ref 1.005–1.030)
Urobilinogen, Ur: 0.2 mg/dL (ref 0.2–1.0)
pH, UA: 5 (ref 5.0–7.5)

## 2020-12-08 LAB — HEMOGLOBIN A1C
Est. average glucose Bld gHb Est-mCnc: 252 mg/dL
Hgb A1c MFr Bld: 10.4 % — ABNORMAL HIGH (ref 4.8–5.6)

## 2020-12-08 NOTE — Telephone Encounter (Signed)
12/08/2020 10:28:34 AM - Truliticy forms back to pt RTS sample  -- Rhetta Mura - Thursday, December 08, 2020 10:26 AM --Received Julious Oka applications for [employer] for Lantus back with a note: Patient never picked up RTS. KW  I asked what med was returned and was told 1 month PAP sample of Trulicity 1.5.   patient has appt at [facility] 12/16/2020, putting this note in patient chart.

## 2020-12-09 ENCOUNTER — Other Ambulatory Visit: Payer: Self-pay

## 2020-12-12 NOTE — Telephone Encounter (Signed)
Patient returning call.

## 2020-12-12 NOTE — Telephone Encounter (Signed)
Returned the patients call. Lmtcb. 

## 2020-12-13 ENCOUNTER — Other Ambulatory Visit: Payer: Self-pay

## 2020-12-13 ENCOUNTER — Encounter: Payer: Self-pay | Admitting: Gerontology

## 2020-12-13 ENCOUNTER — Ambulatory Visit: Payer: Self-pay | Admitting: Gerontology

## 2020-12-13 VITALS — BP 105/74 | HR 70 | Temp 97.2°F | Resp 16 | Wt 279.2 lb

## 2020-12-13 DIAGNOSIS — E1165 Type 2 diabetes mellitus with hyperglycemia: Secondary | ICD-10-CM

## 2020-12-13 DIAGNOSIS — I10 Essential (primary) hypertension: Secondary | ICD-10-CM

## 2020-12-13 DIAGNOSIS — R600 Localized edema: Secondary | ICD-10-CM

## 2020-12-13 MED ORDER — CHLORTHALIDONE 25 MG PO TABS
12.5000 mg | ORAL_TABLET | Freq: Every day | ORAL | 2 refills | Status: DC
  Filled 2020-12-13: qty 30, 60d supply, fill #0

## 2020-12-13 NOTE — Telephone Encounter (Signed)
Pt notified of echo results. Pt has no questions at this time. Will follow up as scheduled 01/10/21.

## 2020-12-13 NOTE — Patient Instructions (Signed)
https://www.diabeteseducator.org/docs/default-source/living-with-diabetes/conquering-the-grocery-store-v1.pdf?sfvrsn=4">  Carbohydrate Counting for Diabetes Mellitus, Adult Carbohydrate counting is a method of keeping track of how many carbohydrates you eat. Eating carbohydrates naturally increases the amount of sugar (glucose) in the blood. Counting how many carbohydrates you eat improves your bloodglucose control, which helps you manage your diabetes. It is important to know how many carbohydrates you can safely have in each meal. This is different for every person. A dietitian can help you make a meal plan and calculate how many carbohydrates you should have at each meal andsnack. What foods contain carbohydrates? Carbohydrates are found in the following foods: Grains, such as breads and cereals. Dried beans and soy products. Starchy vegetables, such as potatoes, peas, and corn. Fruit and fruit juices. Milk and yogurt. Sweets and snack foods, such as cake, cookies, candy, chips, and soft drinks. How do I count carbohydrates in foods? There are two ways to count carbohydrates in food. You can read food labels or learn standard serving sizes of foods. You can use either of the methods or acombination of both. Using the Nutrition Facts label The Nutrition Facts list is included on the labels of almost all packaged foods and beverages in the [place] It includes: The serving size. Information about nutrients in each serving, including the grams (g) of carbohydrate per serving. To use the Nutrition Facts: Decide how many servings you will have. Multiply the number of servings by the number of carbohydrates per serving. The resulting number is the total amount of carbohydrates that you will be having. Learning the standard serving sizes of foods When you eat carbohydrate foods that are not packaged or do not include Nutrition Facts on the label, you need to measure the servings in order to count the  amount of carbohydrates. Measure the foods that you will eat with a food scale or measuring cup, if needed. Decide how many standard-size servings you will eat. Multiply the number of servings by 15. For foods that contain carbohydrates, one serving equals 15 g of carbohydrates. For example, if you eat 2 cups or 10 oz (300 g) of strawberries, you will have eaten 2 servings and 30 g of carbohydrates (2 servings x 15 g = 30 g). For foods that have more than one food mixed, such as soups and casseroles, you must count the carbohydrates in each food that is included. The following list contains standard serving sizes of common carbohydrate-rich foods. Each of these servings has about 15 g of carbohydrates: 1 slice of bread. 1 six-inch (15 cm) tortilla. ? cup or 2 oz (53 g) cooked rice or pasta.  cup or 3 oz (85 g) cooked or canned, drained and rinsed beans or lentils.  cup or 3 oz (85 g) starchy vegetable, such as peas, corn, or squash.  cup or 4 oz (120 g) hot cereal.  cup or 3 oz (85 g) boiled or mashed potatoes, or  or 3 oz (85 g) of a large baked potato.  cup or 4 fl oz (118 mL) fruit juice. 1 cup or 8 fl oz (237 mL) milk. 1 small or 4 oz (106 g) apple.  or 2 oz (63 g) of a medium banana. 1 cup or 5 oz (150 g) strawberries. 3 cups or 1 oz (24 g) popped popcorn. What is an example of carbohydrate counting? To calculate the number of carbohydrates in this sample meal, follow the stepsshown below. Sample meal 3 oz (85 g) chicken breast. ? cup or 4 oz (106 g) brown rice.    cup or 3 oz (85 g) corn. 1 cup or 8 fl oz (237 mL) milk. 1 cup or 5 oz (150 g) strawberries with sugar-free whipped topping. Carbohydrate calculation Identify the foods that contain carbohydrates: Rice. Corn. Milk. Strawberries. Calculate how many servings you have of each food: 2 servings rice. 1 serving corn. 1 serving milk. 1 serving strawberries. Multiply each number of servings by 15 g: 2 servings  rice x 15 g = 30 g. 1 serving corn x 15 g = 15 g. 1 serving milk x 15 g = 15 g. 1 serving strawberries x 15 g = 15 g. Add together all of the amounts to find the total grams of carbohydrates eaten: 30 g + 15 g + 15 g + 15 g = 75 g of carbohydrates total. What are tips for following this plan? Shopping Develop a meal plan and then make a shopping list. Buy fresh and frozen vegetables, fresh and frozen fruit, dairy, eggs, beans, lentils, and whole grains. Look at food labels. Choose foods that have more fiber and less sugar. Avoid processed foods and foods with added sugars. Meal planning Aim to have the same amount of carbohydrates at each meal and for each snack time. Plan to have regular, balanced meals and snacks. Where to find more information American Diabetes Association: www.diabetes.org Centers for Disease Control and Prevention: www.cdc.gov Summary Carbohydrate counting is a method of keeping track of how many carbohydrates you eat. Eating carbohydrates naturally increases the amount of sugar (glucose) in the blood. Counting how many carbohydrates you eat improves your blood glucose control, which helps you manage your diabetes. A dietitian can help you make a meal plan and calculate how many carbohydrates you should have at each meal and snack. This information is not intended to replace advice given to you by your health care provider. Make sure you discuss any questions you have with your healthcare provider. Document Revised: 04/30/2019 Document Reviewed: 05/01/2019 Elsevier Patient Education  2021 Elsevier Inc.  

## 2020-12-13 NOTE — Progress Notes (Signed)
Established Patient Office Visit  Subjective:  Patient ID: [name], female    DOB: 05/05/1976  Age: 45 y.o. MRN: [id number]  CC: No chief complaint on file.   HPI [name] is a 45 y/o female who has a history of Depression, T2DM,GERD, History of Kidney stone, Hypertension, back pain, presents for routine follow up and lab review. Her HgbA1c done on 7 /27/22 increased from 8.3% to 10.4%. She states that she checks her blood glucose bid, but forgot to bring her log. She reports that her fasting reading this morning was 271 mg/dl.  She denies hypoglycemic symptoms, peripheral neuropathy, but endorses Polyuria, Polyuria. She states that she sleeps most of the time and does not exercise. She started 1.1AF Truclicity on 11/19/01, and denies any side effects.  She states that she continues to experience intermittent edema to ankles and feet that has been going on for many months. She  denies claudication and states that swelling resolves with elevation of her leg while sitting down. She has Echocardiogram done on 11/25/20 and it showed  Left ventricular ejection fraction, by estimation, is 60 to 65%. The left ventricle has normal function. The left ventricle has no regional wall motion abnormalities. Left ventricular diastolic parameters are consistent with Grade II diastolic dysfunction (pseudonormalization). 2. Right ventricular systolic function is normal. The right ventricular size is normal. 3. Left atrial size was mildly dilated. 4. PVCs noted.  She will follow up with Cardiologist Dr Garen Lah on 01/10/21. She denies chest pain, palpitation, shortness of breath and light headedness. Overall, she states that she's doing well and offers no further complaint.  Past Medical History:  Diagnosis Date   Depression    Diabetes mellitus without complication (HCC)    GERD (gastroesophageal reflux disease)    Headache    History of kidney stones    History of nephrolithiasis 2015    via CT imaging, including an obstructing stone   Hyperlipidemia    Hypertension 12/2019   per patient, she has never been treated for high blood pressure   Low back pain 12/2019   Neuromuscular disorder ([place])    neuropathy    Past Surgical History:  Procedure Laterality Date   CHOLECYSTECTOMY  1998   CYSTOSCOPY WITH STENT PLACEMENT Right 12/09/2019   Procedure: CYSTOSCOPY WITH STENT PLACEMENT;  Surgeon: Abbie Sons, MD;  Location: [facility] ORS;  Service: Urology;  Laterality: Right;   CYSTOSCOPY/RETROGRADE/URETEROSCOPY  12/29/2019   Procedure: CYSTOSCOPY/RETROGRADE/URETEROSCOPY;  Surgeon: Abbie Sons, MD;  Location: [facility] ORS;  Service: Urology;;    Family History  Problem Relation Age of Onset   Kidney failure Father    Pancreatic cancer Maternal Grandmother     Social History   Socioeconomic History   Marital status: Single    Spouse name: Not on file   Number of children: Not on file   Years of education: Not on file   Highest education level: Not on file  Occupational History   Occupation: not currently working  Tobacco Use   Smoking status: Never   Smokeless tobacco: Never  Vaping Use   Vaping Use: Never used  Substance and Sexual Activity   Alcohol use: No   Drug use: No   Sexual activity: Never  Other Topics Concern   Not on file  Social History Narrative   - Needs a blood sugar monitor       Patient said she is living with her mom and things are going ok. Mom provides  for her food, transport, etc.       Patient wants to wait until next visit before any information is shared.    Social Determinants of Health   Financial Resource Strain: Not on file  Food Insecurity: Not on file  Transportation Needs: Not on file  Physical Activity: Not on file  Stress: Not on file  Social Connections: Not on file  Intimate Partner Violence: Not on file    Outpatient Medications Prior to Visit  Medication Sig Dispense Refill   Dulaglutide (TRULICITY) 1.5  ZO/1.0RU SOPN Inject 1.5 mg into the skin once a week for 4 weeks. (This replaces Victoza). 2 mL 0   insulin glargine (LANTUS) 100 UNIT/ML Solostar Pen Inject 78 Units into the skin at bedtime. 75 mL 5   Insulin Pen Needle (NOVOFINE PEN NEEDLE) 32G X 6 MM MISC USE AS DIRECTED. 200 each PRN   Insulin Pen Needle 32G X 4 MM MISC Use as directed with insulin 100 each 11   potassium citrate (UROCIT-K) 10 MEQ (1080 MG) SR tablet Take 1 tablet (10 mEq total) by mouth 2 (two) times daily with a meal. 60 tablet 11   chlorthalidone (HYGROTON) 25 MG tablet Take 0.5 tablets (12.5 mg total) by mouth daily. 30 tablet 1   Dulaglutide (TRULICITY) 3 EA/5.4UJ SOPN Inject 3 mg as directed once a week. (Start after you take 1.5 mg for 1 month). 2 mL 0   Dulaglutide (TRULICITY) 4.5 WJ/1.9JY SOPN Inject 4.5 mg as directed once a week. (Start after 1 month of 3 mg a week). 6 mL 4   ibuprofen (ADVIL) 600 MG tablet Take 600 mg by mouth every 6 (six) hours as needed for mild pain or moderate pain.     Insulin Syringe-Needle U-100 31G X 15/64" 1 ML MISC USE AS DIRECTED TO DRAW UP INSULIN (Patient not taking: Reported on 11/01/2020) 30 each 11   OVER THE COUNTER MEDICATION Take 1 capsule by mouth daily. [facility] Gallbladder [facility]     Potassium Citrate 15 MEQ (1620 MG) TBCR TAKE ONE TABLET BY MOUTH 2 TIMES A DAY WITH MEALS (Patient not taking: No sig reported) 60 tablet 11   No facility-administered medications prior to visit.    Allergies  Allergen Reactions   Penicillins Hives   Topiramate     Other reaction(s): Other (See Comments) Worsened migraines, N/V   Topiramate Er Nausea And Vomiting    Intensified Migraines    Valtrex [Valacyclovir] Itching    ROS Review of Systems  Constitutional: Negative.   Eyes: Negative.   Respiratory: Negative.    Cardiovascular:  Positive for leg swelling (to bilateral ankle).  Endocrine: Positive for polydipsia and polyuria.  Skin: Negative.   Neurological: Negative.    Psychiatric/Behavioral: Negative.       Objective:    Physical Exam HENT:     Head: Normocephalic and atraumatic.     Mouth/Throat:     Mouth: Mucous membranes are moist.  Eyes:     Extraocular Movements: Extraocular movements intact.     Conjunctiva/sclera: Conjunctivae normal.     Pupils: Pupils are equal, round, and reactive to light.  Cardiovascular:     Rate and Rhythm: Normal rate and regular rhythm.     Pulses: Normal pulses.     Heart sounds: Normal heart sounds.  Pulmonary:     Effort: Pulmonary effort is normal.     Breath sounds: Normal breath sounds.  Musculoskeletal:        General: Swelling (+  1 edema to ankles, trace edema to feet) present.  Skin:    General: Skin is warm.  Neurological:     General: No focal deficit present.     Mental Status: She is alert and oriented to person, place, and time. Mental status is at baseline.  Psychiatric:        Mood and Affect: Mood normal.        Behavior: Behavior normal.        Thought Content: Thought content normal.        Judgment: Judgment normal.    BP 105/74 (BP Location: Left Arm, Patient Position: Sitting, Cuff Size: Large)   Pulse 70   Temp (!) 97.2 F (36.2 C)   Resp 16   Wt 279 lb 3.2 oz (126.6 kg)   LMP 05/21/2020 (Approximate)   SpO2 98%   BMI 42.45 kg/m  Wt Readings from Last 3 Encounters:  12/13/20 279 lb 3.2 oz (126.6 kg)  12/07/20 280 lb 8 oz (127.2 kg)  11/01/20 282 lb (127.9 kg)   Encouraged weight loss  Health Maintenance Due  Topic Date Due   PNEUMOCOCCAL POLYSACCHARIDE VACCINE AGE 57-64 HIGH RISK  Never done   COVID-19 Vaccine (1) Never done   Pneumococcal Vaccine 49-28 Years old (1 - PCV) Never done   Hepatitis C Screening  Never done   TETANUS/TDAP  Never done   PAP SMEAR-Modifier  Never done   OPHTHALMOLOGY EXAM  07/04/2018   COLONOSCOPY (Pts 45-60yr Insurance coverage will need to be confirmed)  Never done   INFLUENZA VACCINE  12/12/2020    There are no preventive care  reminders to display for this patient.  Lab Results  Component Value Date   TSH 2.130 08/31/2020   Lab Results  Component Value Date   WBC 8.1 08/31/2020   HGB 12.0 08/31/2020   HCT 36.9 08/31/2020   MCV 75 (L) 08/31/2020   PLT 412 08/31/2020   Lab Results  Component Value Date   NA 138 08/31/2020   K 4.1 08/31/2020   CO2 23 08/31/2020   GLUCOSE 224 (H) 08/31/2020   BUN 13 08/31/2020   CREATININE 0.84 08/31/2020   BILITOT 0.4 08/31/2020   ALKPHOS 134 (H) 08/31/2020   AST 12 08/31/2020   ALT 15 08/31/2020   PROT 7.9 08/31/2020   ALBUMIN 4.4 08/31/2020   CALCIUM 9.9 08/31/2020   ANIONGAP 8 08/09/2020   EGFR 86 08/31/2020   Lab Results  Component Value Date   CHOL 142 11/11/2019   Lab Results  Component Value Date   HDL 32 (L) 11/11/2019   Lab Results  Component Value Date   LDLCALC 94 11/11/2019   Lab Results  Component Value Date   TRIG 84 11/11/2019   Lab Results  Component Value Date   CHOLHDL 4.4 11/11/2019   Lab Results  Component Value Date   HGBA1C 10.4 (H) 12/07/2020      Assessment & Plan:     1. Essential hypertension - Her blood pressure is under control,and she will continue on current medication, DASH diet and exercise as tolerated. - chlorthalidone (HYGROTON) 25 MG tablet; Take (1/2) tablet (12.5 mg total) by mouth once daily.  Dispense: 30 tablet; Refill: 2  2. Type 2 diabetes mellitus with hyperglycemia, with long-term current use of insulin (HCC) - Her HgbA1c was 10.4%, she will continue her medication, check her blood glucose bid, record and bring log to follow up appointment. She was encouraged to continue on low carb/non concentrated  sweet diet and exercise as tolerated.  3. Bilateral edema of lower extremity - She was advised to wear compression stocking, elevate legs while sitting down and follow p with Cardiologist on 01/10/21.     Follow-up: Return in about 29 days (around 01/11/2021), or if symptoms worsen or fail to  improve.    Tyshawn Keel Jerold Coombe, NP

## 2020-12-20 ENCOUNTER — Telehealth: Payer: Self-pay | Admitting: Pharmacist

## 2020-12-20 NOTE — Telephone Encounter (Signed)
12/20/2020 2:37:30 PM - Lantus & Trulicity forms to patient  -- Rhetta Mura - Tuesday, December 20, 2020 2:35 PM --Mailing forms to patient to sign for Trulicity 3mg , Trulicity 4.5mg , & Lantus Solostar to sign & return.  We previously had a sample of Trulicity for patient & forms for patient to sign and I received note from KW[facility] for non pick up. Also discussed with today when she stopped by, she will contact patient.

## 2020-12-30 ENCOUNTER — Other Ambulatory Visit: Payer: Self-pay

## 2020-12-30 ENCOUNTER — Telehealth: Payer: Self-pay | Admitting: Pharmacist

## 2020-12-30 NOTE — Telephone Encounter (Signed)
12/30/2020 3:53:15 PM - Lantus & Trulicity forms to provider  -- Rhetta Mura - Friday, December 30, 2020 3:52 PM --Patient brought in signed applications for Lantus Solostar & Trulicity 3.0 & 4.5--I am now sending to [place] @ [facility] to sign & return.

## 2021-01-05 ENCOUNTER — Other Ambulatory Visit: Payer: Self-pay

## 2021-01-05 ENCOUNTER — Telehealth: Payer: Self-pay | Admitting: Pharmacist

## 2021-01-05 NOTE — Telephone Encounter (Signed)
01/05/2021 2:35:23 PM - Trulicity 3mg  & 4.5mg  faxed to [facility]  -- - Thursday, January 05, 2021 2:33 PM --January 07, 2021 application for Trulicity 3mg  once a week for 1 month No Refills, then Trulicity 4.5mg  once a week thereafter 3 refills, this dose marked for autofill.  01/05/2021 2:33:32 PM - Lantus Solostar renewal faxed to  -- 01/07/2021 - Thursday, January 05, 2021 2:32 PM --Faxed [employer] renewal for Lantus Solostar Inject 76 units daily at bedtime, # 5 boxes.

## 2021-01-09 ENCOUNTER — Other Ambulatory Visit: Payer: Self-pay

## 2021-01-10 ENCOUNTER — Ambulatory Visit: Payer: Self-pay | Admitting: Cardiology

## 2021-01-11 ENCOUNTER — Encounter: Payer: Self-pay | Admitting: Cardiology

## 2021-01-11 ENCOUNTER — Ambulatory Visit: Payer: Self-pay | Admitting: Gerontology

## 2021-01-11 ENCOUNTER — Other Ambulatory Visit: Payer: Self-pay

## 2021-01-12 ENCOUNTER — Other Ambulatory Visit: Payer: Self-pay

## 2021-01-18 ENCOUNTER — Ambulatory Visit: Payer: Self-pay | Admitting: Gerontology

## 2021-01-18 ENCOUNTER — Encounter: Payer: Self-pay | Admitting: Gerontology

## 2021-01-18 ENCOUNTER — Other Ambulatory Visit: Payer: Self-pay

## 2021-01-18 VITALS — BP 116/74 | HR 76 | Temp 97.2°F | Resp 16 | Ht 68.0 in | Wt 274.7 lb

## 2021-01-18 DIAGNOSIS — E1165 Type 2 diabetes mellitus with hyperglycemia: Secondary | ICD-10-CM

## 2021-01-18 DIAGNOSIS — R600 Localized edema: Secondary | ICD-10-CM

## 2021-01-18 NOTE — Patient Instructions (Signed)
Carbohydrate Counting for Diabetes Mellitus, Adult Carbohydrate counting is a method of keeping track of how many carbohydrates you eat. Eating carbohydrates naturally increases the amount of sugar (glucose) in the blood. Counting how many carbohydrates you eat improves your blood glucose control, which helps you manage your diabetes. It is important to know how many carbohydrates you can safely have in each meal. This is different for every person. A dietitian can help you make a meal plan and calculate how many carbohydrates you should have at each meal and snack. What foods contain carbohydrates? Carbohydrates are found in the following foods: Grains, such as breads and cereals. Dried beans and soy products. Starchy vegetables, such as potatoes, peas, and corn. Fruit and fruit juices. Milk and yogurt. Sweets and snack foods, such as cake, cookies, candy, chips, and soft drinks. How do I count carbohydrates in foods? There are two ways to count carbohydrates in food. You can read food labels or learn standard serving sizes of foods. You can use either of the methods or a combination of both. Using the Nutrition Facts label The Nutrition Facts list is included on the labels of almost all packaged foods and beverages in the [place] It includes: The serving size. Information about nutrients in each serving, including the grams (g) of carbohydrate per serving. To use the Nutrition Facts: Decide how many servings you will have. Multiply the number of servings by the number of carbohydrates per serving. The resulting number is the total amount of carbohydrates that you will be having. Learning the standard serving sizes of foods When you eat carbohydrate foods that are not packaged or do not include Nutrition Facts on the label, you need to measure the servings in order to count the amount of carbohydrates. Measure the foods that you will eat with a food scale or measuring cup, if needed. Decide how  many standard-size servings you will eat. Multiply the number of servings by 15. For foods that contain carbohydrates, one serving equals 15 g of carbohydrates. For example, if you eat 2 cups or 10 oz (300 g) of strawberries, you will have eaten 2 servings and 30 g of carbohydrates (2 servings x 15 g = 30 g). For foods that have more than one food mixed, such as soups and casseroles, you must count the carbohydrates in each food that is included. The following list contains standard serving sizes of common carbohydrate-rich foods. Each of these servings has about 15 g of carbohydrates: 1 slice of bread. 1 six-inch (15 cm) tortilla. ? cup or 2 oz (53 g) cooked rice or pasta.  cup or 3 oz (85 g) cooked or canned, drained and rinsed beans or lentils.  cup or 3 oz (85 g) starchy vegetable, such as peas, corn, or squash.  cup or 4 oz (120 g) hot cereal.  cup or 3 oz (85 g) boiled or mashed potatoes, or  or 3 oz (85 g) of a large baked potato.  cup or 4 fl oz (118 mL) fruit juice. 1 cup or 8 fl oz (237 mL) milk. 1 small or 4 oz (106 g) apple.  or 2 oz (63 g) of a medium banana. 1 cup or 5 oz (150 g) strawberries. 3 cups or 1 oz (24 g) popped popcorn. What is an example of carbohydrate counting? To calculate the number of carbohydrates in this sample meal, follow the steps shown below. Sample meal 3 oz (85 g) chicken breast. ? cup or 4 oz (106 g) brown   rice.  cup or 3 oz (85 g) corn. 1 cup or 8 fl oz (237 mL) milk. 1 cup or 5 oz (150 g) strawberries with sugar-free whipped topping. Carbohydrate calculation Identify the foods that contain carbohydrates: Rice. Corn. Milk. Strawberries. Calculate how many servings you have of each food: 2 servings rice. 1 serving corn. 1 serving milk. 1 serving strawberries. Multiply each number of servings by 15 g: 2 servings rice x 15 g = 30 g. 1 serving corn x 15 g = 15 g. 1 serving milk x 15 g = 15 g. 1 serving strawberries x 15 g = 15  g. Add together all of the amounts to find the total grams of carbohydrates eaten: 30 g + 15 g + 15 g + 15 g = 75 g of carbohydrates total. What are tips for following this plan? Shopping Develop a meal plan and then make a shopping list. Buy fresh and frozen vegetables, fresh and frozen fruit, dairy, eggs, beans, lentils, and whole grains. Look at food labels. Choose foods that have more fiber and less sugar. Avoid processed foods and foods with added sugars. Meal planning Aim to have the same amount of carbohydrates at each meal and for each snack time. Plan to have regular, balanced meals and snacks. Where to find more information American Diabetes Association: www.diabetes.org Centers for Disease Control and Prevention: [employer].br Summary Carbohydrate counting is a method of keeping track of how many carbohydrates you eat. Eating carbohydrates naturally increases the amount of sugar (glucose) in the blood. Counting how many carbohydrates you eat improves your blood glucose control, which helps you manage your diabetes. A dietitian can help you make a meal plan and calculate how many carbohydrates you should have at each meal and snack. This information is not intended to replace advice given to you by your health care provider. Make sure you discuss any questions you have with your health care provider. Document Revised: 04/30/2019 Document Reviewed: 05/01/2019 Elsevier Patient Education  2021 [employer] Inc. Edema Edema is an abnormal buildup of fluids in the body tissues and under the skin. Swelling of the legs, feet, and ankles is a common symptom that becomes more likely as you get older. Swelling is also common in looser tissues, like around the eyes. When the affected area is squeezed, the fluid may move out of that spot and leave a dent for a few moments. This dent is called pitting edema. There are many possible causes of edema. Eating too much salt (sodium) and being on your  feet or sitting for a long time can cause edema in your legs, feet, and ankles. Hot weather may make edema worse. Common causes of edema include: Heart failure. Liver or kidney disease. Weak leg blood vessels. Cancer. An injury. Pregnancy. Medicines. Being obese. Low protein levels in the blood. Edema is usually painless. Your skin may look swollen or shiny. Follow these instructions at home: Keep the affected body part raised (elevated) above the level of your heart when you are sitting or lying down. Do not sit still or stand for long periods of time. Do not wear tight clothing. Do not wear garters on your upper legs. Exercise your legs to get your circulation going. This helps to move the fluid back into your blood vessels, and it may help the swelling go down. Wear elastic bandages or support stockings to reduce swelling as told by your health care provider. Eat a low-salt (low-sodium) diet to reduce fluid as told by your  health care provider. Depending on the cause of your swelling, you may need to limit how much fluid you drink (fluid restriction). Take over-the-counter and prescription medicines only as told by your health care provider. Contact a health care provider if: Your edema does not get better with treatment. You have heart, liver, or kidney disease and have symptoms of edema. You have sudden and unexplained weight gain. Get help right away if: You develop shortness of breath or chest pain. You cannot breathe when you lie down. You develop pain, redness, or warmth in the swollen areas. You have heart, liver, or kidney disease and suddenly get edema. You have a fever and your symptoms suddenly get worse. Summary Edema is an abnormal buildup of fluids in the body tissues and under the skin. Eating too much salt (sodium) and being on your feet or sitting for a long time can cause edema in your legs, feet, and ankles. Keep the affected body part raised (elevated) above  the level of your heart when you are sitting or lying down. This information is not intended to replace advice given to you by your health care provider. Make sure you discuss any questions you have with your health care provider. Document Revised: 03/08/2020 Document Reviewed: 02/23/2020 Elsevier Patient Education  2022 [employer].

## 2021-01-18 NOTE — Progress Notes (Signed)
Established Patient Office Visit  Subjective:  Patient ID: [name], female    DOB: Apr 06, 1976  Age: 45 y.o. MRN: [id number]  CC:  Chief Complaint  Patient presents with   Follow-up    HPI [name]  is a 45 y/o female who has a history of Depression, T2DM,GERD, History of Kidney stone, Hypertension, back pain, presents for routine follow up. She continues to experience edema to bilateral lower extremity, but missed her Cardiology appointment on 01/11/32. She states that swelling to her legs are moderately relieved with elevating her legs. She denies  claudication,chest pain, palpitation, shortness of breath and light headedness. Overall, she states that she's doing well and offers no further complaint.  Past Medical History:  Diagnosis Date   Depression    Diabetes mellitus without complication (HCC)    GERD (gastroesophageal reflux disease)    Headache    History of kidney stones    History of nephrolithiasis 2015   via CT imaging, including an obstructing stone   Hyperlipidemia    Hypertension 12/2019   per patient, she has never been treated for high blood pressure   Low back pain 12/2019   Neuromuscular disorder (HCC)    neuropathy    Past Surgical History:  Procedure Laterality Date   CHOLECYSTECTOMY  1998   CYSTOSCOPY WITH STENT PLACEMENT Right 12/09/2019   Procedure: CYSTOSCOPY WITH STENT PLACEMENT;  Surgeon: Riki Altes, MD;  Location: [facility];  Service: Urology;  Laterality: Right;   CYSTOSCOPY/RETROGRADE/URETEROSCOPY  12/29/2019   Procedure: CYSTOSCOPY/RETROGRADE/URETEROSCOPY;  Surgeon: Riki Altes, MD;  Location: [facility];  Service: Urology;;    Family History  Problem Relation Age of Onset   Hypertension Mother    Heart disease Mother    Kidney failure Father    Sleep apnea Sister    Pancreatic cancer Maternal Grandmother    Diabetes Maternal Great-grandmother     Social History   Socioeconomic History   Marital  status: Single    Spouse name: Not on file   Number of children: Not on file   Years of education: Not on file   Highest education level: Not on file  Occupational History   Occupation: not currently working  Tobacco Use   Smoking status: Never   Smokeless tobacco: Never  Vaping Use   Vaping Use: Never used  Substance and Sexual Activity   Alcohol use: No   Drug use: No   Sexual activity: Never  Other Topics Concern   Not on file  Social History Narrative   - Needs a blood sugar monitor       Patient said she is living with her mom and things are going ok. Mom provides for her food, transport, etc.       Patient wants to wait until next visit before any information is shared.    Social Determinants of Health   Financial Resource Strain: Not on file  Food Insecurity: No Food Insecurity   Worried About Programme researcher, broadcasting/film/video in the Last Year: Never true   Ran Out of Food in the Last Year: Never true  Transportation Needs: No Transportation Needs   Lack of Transportation (Medical): No   Lack of Transportation (Non-Medical): No  Physical Activity: Not on file  Stress: Not on file  Social Connections: Not on file  Intimate Partner Violence: Not on file    Outpatient Medications Prior to Visit  Medication Sig Dispense Refill   chlorthalidone (HYGROTON) 25 MG tablet  Take (1/2) tablet (12.5 mg total) by mouth once daily. 30 tablet 2   Dulaglutide (TRULICITY) 1.5 DV/7.6HY SOPN Inject 3 mg (2 X 1.$Remo'5mg'NXASU$  pens) as directed once a week. (Start after you take the 1.$RemoveBe'5mg'GaItdfRBJ$  weekly for a month). 4 mL 0   ibuprofen (ADVIL) 600 MG tablet Take 600 mg by mouth every 6 (six) hours as needed for mild pain or moderate pain.     insulin glargine (LANTUS) 100 UNIT/ML Solostar Pen Inject 78 Units into the skin at bedtime. 75 mL 5   Insulin Pen Needle (NOVOFINE PEN NEEDLE) 32G X 6 MM MISC USE AS DIRECTED. 200 each PRN   Insulin Pen Needle 32G X 4 MM MISC Use as directed with insulin 100 each 11    potassium citrate (UROCIT-K) 10 MEQ (1080 MG) SR tablet Take 1 tablet (10 mEq total) by mouth 2 (two) times daily with a meal. 60 tablet 11   Dulaglutide (TRULICITY) 4.5 WV/3.7TG SOPN Inject 4.5 mg as directed once a week. (Start after 1 month of 3 mg a week). 6 mL 4   Potassium Citrate 15 MEQ (1620 MG) TBCR TAKE ONE TABLET BY MOUTH 2 TIMES A DAY WITH MEALS (Patient not taking: No sig reported) 60 tablet 11   Dulaglutide (TRULICITY) 1.5 GY/6.9SW SOPN Inject 1.5 mg into the skin once a week for 4 weeks. (This replaces Victoza). 2 mL 0   Insulin Syringe-Needle U-100 31G X 15/64" 1 ML MISC USE AS DIRECTED TO DRAW UP INSULIN (Patient not taking: Reported on 11/01/2020) 30 each 11   OVER THE COUNTER MEDICATION Take 1 capsule by mouth daily. [facility]     No facility-administered medications prior to visit.    Allergies  Allergen Reactions   Penicillins Hives   Topiramate     Other reaction(s): Other (See Comments) Worsened migraines, N/V   Topiramate Er Nausea And Vomiting    Intensified Migraines    Valtrex [Valacyclovir] Itching    ROS Review of Systems  Constitutional: Negative.   Respiratory: Negative.    Cardiovascular:  Positive for leg swelling.  Neurological: Negative.      Objective:    Physical Exam HENT:     Head: Normocephalic and atraumatic.  Cardiovascular:     Rate and Rhythm: Normal rate and regular rhythm.     Pulses: Normal pulses.     Heart sounds: Normal heart sounds.  Pulmonary:     Effort: Pulmonary effort is normal.     Breath sounds: Normal breath sounds.  Musculoskeletal:        General: Swelling (trace edema to bilateral lower extremities) present.  Neurological:     General: No focal deficit present.     Mental Status: She is alert and oriented to person, place, and time. Mental status is at baseline.    BP 116/74 (BP Location: Right Arm, Patient Position: Sitting, Cuff Size: Large)   Pulse 76   Temp (!) 97.2 F (36.2 C)    Resp 16   Ht $R'5\' 8"'eu$  (1.727 m)   Wt 274 lb 11.2 oz (124.6 kg)   LMP 11/17/2020 (Approximate)   SpO2 97%   BMI 41.77 kg/m  Wt Readings from Last 3 Encounters:  01/18/21 274 lb 11.2 oz (124.6 kg)  12/13/20 279 lb 3.2 oz (126.6 kg)  12/07/20 280 lb 8 oz (127.2 kg)   Encouraged weight loss  Health Maintenance Due  Topic Date Due   COVID-19 Vaccine (1) Never done   PNEUMOCOCCAL POLYSACCHARIDE VACCINE  AGE 43-64 HIGH RISK  Never done   Pneumococcal Vaccine 12-30 Years old (1 - PCV) Never done   Hepatitis C Screening  Never done   TETANUS/TDAP  Never done   PAP SMEAR-Modifier  Never done   OPHTHALMOLOGY EXAM  07/04/2018   COLONOSCOPY (Pts 45-2yrs Insurance coverage will need to be confirmed)  Never done   INFLUENZA VACCINE  Never done    There are no preventive care reminders to display for this patient.  Lab Results  Component Value Date   TSH 2.130 08/31/2020   Lab Results  Component Value Date   WBC 8.1 08/31/2020   HGB 12.0 08/31/2020   HCT 36.9 08/31/2020   MCV 75 (L) 08/31/2020   PLT 412 08/31/2020   Lab Results  Component Value Date   NA 138 08/31/2020   K 4.1 08/31/2020   CO2 23 08/31/2020   GLUCOSE 224 (H) 08/31/2020   BUN 13 08/31/2020   CREATININE 0.84 08/31/2020   BILITOT 0.4 08/31/2020   ALKPHOS 134 (H) 08/31/2020   AST 12 08/31/2020   ALT 15 08/31/2020   PROT 7.9 08/31/2020   ALBUMIN 4.4 08/31/2020   CALCIUM 9.9 08/31/2020   ANIONGAP 8 08/09/2020   EGFR 86 08/31/2020   Lab Results  Component Value Date   CHOL 142 11/11/2019   Lab Results  Component Value Date   HDL 32 (L) 11/11/2019   Lab Results  Component Value Date   LDLCALC 94 11/11/2019   Lab Results  Component Value Date   TRIG 84 11/11/2019   Lab Results  Component Value Date   CHOLHDL 4.4 11/11/2019   Lab Results  Component Value Date   HGBA1C 10.4 (H) 12/07/2020      Assessment & Plan:    1. Bilateral edema of lower extremity - She was advised to schedule  Cardiology appointment. She was encouraged to continue elevating her legs while sitting down. She advised to go to the ED with worsening symptoms.  2. Type 2 diabetes mellitus with hyperglycemia, with long-term current use of insulin (HCC)  - Will check HgB A1c;      Follow-up: Return in about 8 weeks (around 03/15/2021), or if symptoms worsen or fail to improve.    Linet Brash Jerold Coombe, NP

## 2021-01-24 ENCOUNTER — Other Ambulatory Visit: Payer: Self-pay

## 2021-01-25 ENCOUNTER — Other Ambulatory Visit: Payer: Self-pay

## 2021-01-26 NOTE — Progress Notes (Signed)
Cardiology Office Note    Date:  01/27/2021   ID:  [name], DOB Mar 21, 1976, MRN [id number]  PCP:  Rolm Gala, NP  Cardiologist:  Debbe Odea, MD  Electrophysiologist:  None   Chief Complaint: Follow-up  History of Present Illness:   [name] is a 45 y.o. female with history of diastolic dysfunction, frequent PVCs, lower extremity swelling, COVID in 06/2020, uncontrolled DM2, HTN, nephrolithiasis, and back pain who presents for follow-up of prior echo.  Previously seen by PCP in 03/2020 with noted irregular heartbeat, with EKG showed sinus rhythm with occasional PVCs.  BNP normal.  She was referred to cardiology.  She was seen in the ED in 06/2020 with chest pain.  Glucose noted to be 398.  High-sensitivity troponin negative x2.  EKG showed sinus rhythm with continued occasional PVCs.  COVID positive.    She was seen again in the ED in 07/2020 after presenting to outside office with BP in the 160s over 60s and a reported heart rate in the 40s.  EKG in the ED showed sinus rhythm with frequent PVCs in a pattern of ventricular bigeminy and trigeminy.    She was evaluated by our office as a new patient in 09/2020 for chest discomfort that had been intermittent over the preceding several months and was not associated with exertion.  She also noted bilateral arm pain that radiated to the chest.  Arm discomfort was sometimes associated with movement.  EKG demonstrated sinus rhythm with occasional PVCs.  Chest pain was felt to be musculoskeletal in etiology, and less likely ischemia/CAD.  Echo was recommended and performed in 11/2020 which demonstrated a preserved LVSF with an EF of 60 to 65%, no regional wall motion abnormalities, grade 2 diastolic dysfunction, normal RV systolic function and ventricular cavity size, mildly dilated left atrium, and PVCs.  She was evaluated by her PCP on 01/18/2021 with ongoing bilateral lower extremity swelling that was moderately  improved with elevation.  She has been maintained on chlorthalidone 12.5 mg daily.  She was advised to follow-up with cardiology.  She comes in doing reasonably well.  She notes over the past couple of days her lower extremity swelling has resolved.  This swelling is typically worse at the end of a long day and improved in the morning after lying supine.  She denies any angina, dyspnea, palpitations, dizziness, presyncope, or syncope.  She continues to struggle with peripheral neuropathy which has been attributed to uncontrolled diabetes.   Labs independently reviewed: 01/2021 - A1c 10.4 08/2020 - BUN 13, serum creatinine 0.84, potassium 4.1, albumin 4.4, AST/ALT normal, Hgb 12.0, PLT 412, magnesium 1.7, TSH normal 10/2019 - TC 142, TG 84, HDL 32, LDL 94  Past Medical History:  Diagnosis Date   Depression    Diabetes mellitus without complication (HCC)    GERD (gastroesophageal reflux disease)    Headache    History of kidney stones    History of nephrolithiasis 2015   via CT imaging, including an obstructing stone   Hyperlipidemia    Hypertension 12/2019   per patient, she has never been treated for high blood pressure   Low back pain 12/2019   Neuromuscular disorder (HCC)    neuropathy    Past Surgical History:  Procedure Laterality Date   CHOLECYSTECTOMY  1998   CYSTOSCOPY WITH STENT PLACEMENT Right 12/09/2019   Procedure: CYSTOSCOPY WITH STENT PLACEMENT;  Surgeon: Riki Altes, MD;  Location: [facility];  Service: Urology;  Laterality: Right;  CYSTOSCOPY/RETROGRADE/URETEROSCOPY  12/29/2019   Procedure: CYSTOSCOPY/RETROGRADE/URETEROSCOPY;  Surgeon: Riki Altes, MD;  Location: [facility];  Service: Urology;;    Current Medications: Current Meds  Medication Sig   chlorthalidone (HYGROTON) 25 MG tablet Take (1/2) tablet (12.5 mg total) by mouth once daily.   Dulaglutide (TRULICITY) 1.5 MG/0.5ML SOPN Inject 3 mg (2 X 1.5mg  pens) as directed once a week. (Start after you take  the 1.5mg  weekly for a month).   ibuprofen (ADVIL) 600 MG tablet Take 600 mg by mouth every 6 (six) hours as needed for mild pain or moderate pain.   insulin glargine (LANTUS) 100 UNIT/ML injection Inject 80 Units into the skin at bedtime.   Insulin Pen Needle (NOVOFINE PEN NEEDLE) 32G X 6 MM MISC USE AS DIRECTED.   Insulin Pen Needle 32G X 4 MM MISC Use as directed with insulin   Potassium Citrate 15 MEQ (1620 MG) TBCR TAKE ONE TABLET BY MOUTH 2 TIMES A DAY WITH MEALS    Allergies:   Penicillins, Topiramate, Topiramate er, and Valtrex [valacyclovir]   Social History   Socioeconomic History   Marital status: Single    Spouse name: Not on file   Number of children: Not on file   Years of education: Not on file   Highest education level: Not on file  Occupational History   Occupation: not currently working  Tobacco Use   Smoking status: Never   Smokeless tobacco: Never  Vaping Use   Vaping Use: Never used  Substance and Sexual Activity   Alcohol use: No   Drug use: No   Sexual activity: Never  Other Topics Concern   Not on file  Social History Narrative   - Needs a blood sugar monitor       Patient said she is living with her mom and things are going ok. Mom provides for her food, transport, etc.       Patient wants to wait until next visit before any information is shared.    Social Determinants of Health   Financial Resource Strain: Not on file  Food Insecurity: No Food Insecurity   Worried About Programme researcher, broadcasting/film/video in the Last Year: Never true   Ran Out of Food in the Last Year: Never true  Transportation Needs: No Transportation Needs   Lack of Transportation (Medical): No   Lack of Transportation (Non-Medical): No  Physical Activity: Not on file  Stress: Not on file  Social Connections: Not on file     Family History:  The patient's family history includes Diabetes in her maternal great-grandmother; Heart disease in her mother; Hypertension in her mother;  Kidney failure in her father; Pancreatic cancer in her maternal grandmother; Sleep apnea in her sister.  ROS:   Review of Systems  Constitutional:  Positive for malaise/fatigue. Negative for chills, diaphoresis, fever and weight loss.  HENT:  Negative for congestion.   Eyes:  Negative for discharge and redness.  Respiratory:  Negative for cough, sputum production, shortness of breath and wheezing.   Cardiovascular:  Positive for leg swelling. Negative for chest pain, palpitations, orthopnea, claudication and PND.       Resolved lower extremity swelling  Gastrointestinal:  Negative for abdominal pain, heartburn, nausea and vomiting.  Musculoskeletal:  Negative for falls and myalgias.  Skin:  Negative for rash.  Neurological:  Positive for sensory change and weakness. Negative for dizziness, tingling, tremors, speech change, focal weakness and loss of consciousness.       Peripheral neuropathy  Endo/Heme/Allergies:  Does not bruise/bleed easily.  Psychiatric/Behavioral:  Negative for substance abuse. The patient is not nervous/anxious.   All other systems reviewed and are negative.   EKGs/Labs/Other Studies Reviewed:    Studies reviewed were summarized above. The additional studies were reviewed today:  2D echo 11/25/2020: 1. Left ventricular ejection fraction, by estimation, is 60 to 65%. The  left ventricle has normal function. The left ventricle has no regional  wall motion abnormalities. Left ventricular diastolic parameters are  consistent with Grade II diastolic  dysfunction (pseudonormalization).   2. Right ventricular systolic function is normal. The right ventricular  size is normal.   3. Left atrial size was mildly dilated.   4. PVCs noted.     EKG:  EKG is ordered today.  The EKG ordered today demonstrates NSR, 88 bpm, frequent PVCs in a pattern of ventricular bigeminy, nonspecific ST-T changes  Recent Labs: 03/23/2020: BNP 18.2 08/31/2020: ALT 15; BUN 13; Creatinine,  Ser 0.84; Hemoglobin 12.0; Magnesium 1.7; Platelets 412; Potassium 4.1; Sodium 138; TSH 2.130  Recent Lipid Panel    Component Value Date/Time   CHOL 142 11/11/2019 1254   TRIG 84 11/11/2019 1254   HDL 32 (L) 11/11/2019 1254   CHOLHDL 4.4 11/11/2019 1254   CHOLHDL 4.3 03/29/2007 0615   VLDL 17 03/29/2007 0615   LDLCALC 94 11/11/2019 1254    PHYSICAL EXAM:    VS:  BP 102/76 (BP Location: Left Arm, Patient Position: Sitting, Cuff Size: Large)   Pulse 88   Ht 5\' 3"  (1.6 m)   Wt 273 lb (123.8 kg)   SpO2 98%   BMI 48.36 kg/m   BMI: Body mass index is 48.36 kg/m.  Physical Exam Vitals reviewed.  Constitutional:      Appearance: She is well-developed.  HENT:     Head: Normocephalic and atraumatic.  Eyes:     General:        Right eye: No discharge.        Left eye: No discharge.  Neck:     Vascular: No JVD.  Cardiovascular:     Rate and Rhythm: Normal rate and regular rhythm. FrequentExtrasystoles are present.    Pulses:          Dorsalis pedis pulses are 2+ on the right side and 2+ on the left side.       Posterior tibial pulses are 2+ on the right side and 2+ on the left side.     Heart sounds: Normal heart sounds, S1 normal and S2 normal. Heart sounds not distant. No midsystolic click and no opening snap. No murmur heard.   No friction rub.  Pulmonary:     Effort: Pulmonary effort is normal. No respiratory distress.     Breath sounds: Normal breath sounds. No decreased breath sounds, wheezing or rales.  Chest:     Chest wall: No tenderness.  Abdominal:     General: There is no distension.     Palpations: Abdomen is soft.     Tenderness: There is no abdominal tenderness.  Musculoskeletal:     Cervical back: Normal range of motion.     Right lower leg: No edema.     Left lower leg: No edema.  Skin:    General: Skin is warm and dry.     Nails: There is no clubbing.  Neurological:     Mental Status: She is alert and oriented to person, place, and time.   Psychiatric:  Speech: Speech normal.        Behavior: Behavior normal.        Thought Content: Thought content normal.        Judgment: Judgment normal.    Wt Readings from Last 3 Encounters:  01/27/21 273 lb (123.8 kg)  01/18/21 274 lb 11.2 oz (124.6 kg)  12/13/20 279 lb 3.2 oz (126.6 kg)     ASSESSMENT & PLAN:   Frequent PVCs: I suspect her PVC burden has contributed to pseudo-bradycardia, as she was noted to be in ventricular bigeminy/trigeminy when she was sent to the ED in 07/2020 with a ventricular rate that was actually in the 90s bpm.  Underlying etiology of PVC burden remains uncertain at this time.  Check BMP, magnesium, and TSH.  Place a 3-day Zio patch to quantify PVC burden.  Echo demonstrated a preserved LV systolic function as above.  Consider noninvasive ischemic testing in follow-up.  BP precludes addition of beta-blocker.  With prior noted lower extremity swelling, nondihydropyridine calcium channel blocker is not ideal.  She will likely need to decrease her caffeine intake as her p.o. fluid intake now consists almost exclusively of green tea which contains approximately 28 mg of caffeine per 8 ounces.  Lower extremity swelling: Echo in 11/2020 demonstrated preserved LV systolic function with grade 2 diastolic dysfunction.  Likely multifactorial including dependent edema in the context of obesity, venous insufficiency, and diastolic dysfunction.  Symptoms have resolved.  She remains on chlorthalidone per PCP.  Recommend leg elevation.  She does indicate she is unable to wear compression stockings secondary to peripheral neuropathy, which is likely in the context of uncontrolled diabetes.  Low-sodium diet recommended.  Would not aggressively diurese.  Diastolic dysfunction: Volume status is difficult to assess on physical exam secondary to body habitus, though she does appear well compensated.  She remains on chlorthalidone as above.  Optimal blood pressure control is  recommended.  Weight loss encouraged.  HTN: Blood pressure is well controlled in the office today.  DM2: Poorly controlled.  Follow-up with PCP.  Obesity: Possibly contributing to her lower extremity swelling with dependent edema.  Her weight is down 6 pounds today when compared to her last office visit in 09/2020.  Continued weight loss is encouraged through heart healthy diet and regular exercise.  Disposition: F/u with Dr. Azucena Cecil or an APP in 6 weeks.   Medication Adjustments/Labs and Tests Ordered: Current medicines are reviewed at length with the patient today.  Concerns regarding medicines are outlined above. Medication changes, Labs and Tests ordered today are summarized above and listed in the Patient Instructions accessible in Encounters.   Signed, Eula Listen, PA-C 01/27/2021 12:56 PM     [facility] [place] [address] [place], [place] [address] [phone]

## 2021-01-27 ENCOUNTER — Other Ambulatory Visit: Payer: Self-pay

## 2021-01-27 ENCOUNTER — Ambulatory Visit (INDEPENDENT_AMBULATORY_CARE_PROVIDER_SITE_OTHER): Payer: Self-pay | Admitting: Physician Assistant

## 2021-01-27 ENCOUNTER — Ambulatory Visit (INDEPENDENT_AMBULATORY_CARE_PROVIDER_SITE_OTHER): Payer: Self-pay

## 2021-01-27 ENCOUNTER — Encounter: Payer: Self-pay | Admitting: Physician Assistant

## 2021-01-27 VITALS — BP 102/76 | HR 88 | Ht 63.0 in | Wt 273.0 lb

## 2021-01-27 DIAGNOSIS — I493 Ventricular premature depolarization: Secondary | ICD-10-CM

## 2021-01-27 DIAGNOSIS — Z6841 Body Mass Index (BMI) 40.0 and over, adult: Secondary | ICD-10-CM

## 2021-01-27 DIAGNOSIS — E1165 Type 2 diabetes mellitus with hyperglycemia: Secondary | ICD-10-CM

## 2021-01-27 DIAGNOSIS — M7989 Other specified soft tissue disorders: Secondary | ICD-10-CM

## 2021-01-27 DIAGNOSIS — I10 Essential (primary) hypertension: Secondary | ICD-10-CM

## 2021-01-27 DIAGNOSIS — I5189 Other ill-defined heart diseases: Secondary | ICD-10-CM

## 2021-01-27 NOTE — Patient Instructions (Signed)
Medication Instructions:   Your physician recommends that you continue on your current medications as directed. Please refer to the Current Medication list given to you today.  *If you need a refill on your cardiac medications before your next appointment, please call your pharmacy*   Lab Work:  Today: BMET, Magnesium, TSH  If you have labs (blood work) drawn today and your tests are completely normal, you will receive your results only by: MyChart Message (if you have MyChart) OR A paper copy in the mail If you have any lab test that is abnormal or we need to change your treatment, we will call you to review the results.   Testing/Procedures:  Your physician has recommended that you wear a Zio XT monitor for 3 DAYS.   This monitor is a medical device that records the heart's electrical activity. Doctors most often use these monitors to diagnose arrhythmias. Arrhythmias are problems with the speed or rhythm of the heartbeat. The monitor is a small device applied to your chest. You can wear one while you do your normal daily activities. While wearing this monitor if you have any symptoms to push the button and record what you felt. Once you have worn this monitor for the period of time provider prescribed (Usually 14 days), you will return the monitor device in the postage paid box. Once it is returned they will download the data collected and provide [place] with a report which the provider will then review and we will call you with those results. Important tips:  Avoid showering during the first 24 hours of wearing the monitor. Avoid excessive sweating to help maximize wear time. Do not submerge the device, no hot tubs, and no swimming pools. Keep any lotions or oils away from the patch. After 24 hours you may shower with the patch on. Take brief showers with your back facing the shower head.  Do not remove patch once it has been placed because that will interrupt data and decrease adhesive  wear time. Push the button when you have any symptoms and write down what you were feeling. Once you have completed wearing your monitor, remove and place into box which has postage paid and place in your outgoing mailbox.  If for some reason you have misplaced your box then call our office and we can provide another box and/or mail it off for you.      Follow-Up: At [facility], you and your health needs are our priority.  As part of our continuing mission to provide you with exceptional heart care, we have created designated Provider Care Teams.  These Care Teams include your primary Cardiologist (physician) and Advanced Practice Providers (APPs -  Physician Assistants and Nurse Practitioners) who all work together to provide you with the care you need, when you need it.  We recommend signing up for the patient portal called "MyChart".  Sign up information is provided on this After Visit Summary.  MyChart is used to connect with patients for Virtual Visits (Telemedicine).  Patients are able to view lab/test results, encounter notes, upcoming appointments, etc.  Non-urgent messages can be sent to your provider as well.   To learn more about what you can do with MyChart, go to [email or web address].    Your next appointment:   6 week(s)  The format for your next appointment:   In Person  Provider:   Eula Listen, PA-C

## 2021-01-28 ENCOUNTER — Telehealth: Payer: Self-pay | Admitting: Cardiology

## 2021-01-28 LAB — SPECIMEN STATUS

## 2021-01-28 NOTE — Telephone Encounter (Signed)
Received call from [facility] regarding critical valve. Labs 9/16 noted glucose of 506. Attempted to call patient this morning to verify morning CBG but sent to VM x2. Message left to callback.

## 2021-01-30 ENCOUNTER — Telehealth: Payer: Self-pay | Admitting: *Deleted

## 2021-01-30 NOTE — Telephone Encounter (Signed)
-----   Message from Sondra Barges, PA-C sent at 01/30/2021  7:17 AM EDT ----- - Please inform the patient her glucose remains poorly controlled. She needs to contact her PCP today for further management of this. I will forward results to her PCP as well.   - Corrected sodium normal at 142.  - Please check on CBC.   [place], just an FYI regarding her glucose.

## 2021-01-30 NOTE — Telephone Encounter (Signed)
Left voicemail message to call back for review of results and recommendations.  

## 2021-01-31 ENCOUNTER — Ambulatory Visit: Payer: Self-pay

## 2021-01-31 ENCOUNTER — Other Ambulatory Visit: Payer: Self-pay | Admitting: Gerontology

## 2021-01-31 ENCOUNTER — Other Ambulatory Visit: Payer: Self-pay

## 2021-01-31 DIAGNOSIS — R899 Unspecified abnormal finding in specimens from other organs, systems and tissues: Secondary | ICD-10-CM

## 2021-01-31 LAB — SPECIMEN STATUS REPORT

## 2021-02-01 ENCOUNTER — Other Ambulatory Visit: Payer: Self-pay

## 2021-02-01 ENCOUNTER — Other Ambulatory Visit: Payer: Self-pay | Admitting: Urology

## 2021-02-01 ENCOUNTER — Other Ambulatory Visit: Payer: Self-pay | Admitting: Gerontology

## 2021-02-02 ENCOUNTER — Encounter: Payer: Self-pay | Admitting: *Deleted

## 2021-02-02 LAB — BASIC METABOLIC PANEL
BUN/Creatinine Ratio: 15 (ref 9–23)
BUN: 14 mg/dL (ref 6–24)
CO2: 22 mmol/L (ref 20–29)
Calcium: 9.6 mg/dL (ref 8.7–10.2)
Chloride: 92 mmol/L — ABNORMAL LOW (ref 96–106)
Creatinine, Ser: 0.95 mg/dL (ref 0.57–1.00)
Glucose: 506 mg/dL (ref 65–99)
Potassium: 4 mmol/L (ref 3.5–5.2)
Sodium: 132 mmol/L — ABNORMAL LOW (ref 134–144)
eGFR: 74 mL/min/{1.73_m2} (ref >59)

## 2021-02-02 LAB — MAGNESIUM: Magnesium: 1.6 mg/dL (ref 1.6–2.3)

## 2021-02-02 LAB — TSH: TSH: 1.99 u[IU]/mL (ref 0.450–4.500)

## 2021-02-02 MED FILL — Potassium Citrate Tab ER 10 MEQ (1080 MG): ORAL | 30 days supply | Qty: 60 | Fill #0 | Status: CN

## 2021-02-02 NOTE — Telephone Encounter (Signed)
Called and it states that the call can not be completed at this time.

## 2021-02-02 NOTE — Telephone Encounter (Signed)
Called [facility] to check on CBC. She states that they did receive a purple top but there was no order for that. Reviewed that we did want to have that processed and provided diagnosis codes. She states that they will get those results for [place]. No further needs at this time.

## 2021-02-02 NOTE — Telephone Encounter (Signed)
Left voicemail message on her mothers line to have patient call us back for review of results.

## 2021-02-03 ENCOUNTER — Other Ambulatory Visit: Payer: Self-pay

## 2021-02-03 LAB — CBC WITH DIFFERENTIAL/PLATELET
Basophils Absolute: 0 x10E3/uL (ref 0.0–0.2)
Basos: 1 %
EOS (ABSOLUTE): 0.1 x10E3/uL (ref 0.0–0.4)
Eos: 2 %
Hematocrit: 35.7 % (ref 34.0–46.6)
Hemoglobin: 10.8 g/dL — ABNORMAL LOW (ref 11.1–15.9)
Immature Grans (Abs): 0 x10E3/uL (ref 0.0–0.1)
Immature Granulocytes: 0 %
Lymphocytes Absolute: 1.2 x10E3/uL (ref 0.7–3.1)
Lymphs: 19 %
MCH: 23.5 pg — ABNORMAL LOW (ref 26.6–33.0)
MCHC: 30.3 g/dL — ABNORMAL LOW (ref 31.5–35.7)
MCV: 78 fL — ABNORMAL LOW (ref 79–97)
Monocytes Absolute: 0.4 x10E3/uL (ref 0.1–0.9)
Monocytes: 7 %
Neutrophils Absolute: 4.7 x10E3/uL (ref 1.4–7.0)
Neutrophils: 71 %
Platelets: 347 x10E3/uL (ref 150–450)
RBC: 4.59 x10E6/uL (ref 3.77–5.28)
RDW: 13.9 % (ref 11.7–15.4)
WBC: 6.4 x10E3/uL (ref 3.4–10.8)

## 2021-02-03 LAB — SPECIMEN STATUS REPORT

## 2021-02-06 ENCOUNTER — Encounter: Payer: Self-pay | Admitting: *Deleted

## 2021-02-07 ENCOUNTER — Other Ambulatory Visit: Payer: Self-pay

## 2021-02-08 ENCOUNTER — Other Ambulatory Visit: Payer: Self-pay

## 2021-02-08 NOTE — Telephone Encounter (Signed)
Letter sent to patient with results and to call if any questions,.

## 2021-02-15 ENCOUNTER — Other Ambulatory Visit: Payer: Self-pay

## 2021-02-15 DIAGNOSIS — E1165 Type 2 diabetes mellitus with hyperglycemia: Secondary | ICD-10-CM

## 2021-02-15 DIAGNOSIS — Z794 Long term (current) use of insulin: Secondary | ICD-10-CM

## 2021-02-15 DIAGNOSIS — R899 Unspecified abnormal finding in specimens from other organs, systems and tissues: Secondary | ICD-10-CM

## 2021-02-16 ENCOUNTER — Other Ambulatory Visit: Payer: Self-pay

## 2021-02-16 ENCOUNTER — Telehealth: Payer: Self-pay

## 2021-02-16 LAB — CBC WITH DIFFERENTIAL/PLATELET
Basophils Absolute: 0 10*3/uL (ref 0.0–0.2)
Basos: 0 %
EOS (ABSOLUTE): 0.2 10*3/uL (ref 0.0–0.4)
Eos: 2 %
Hematocrit: 37.2 % (ref 34.0–46.6)
Hemoglobin: 11.3 g/dL (ref 11.1–15.9)
Immature Grans (Abs): 0 10*3/uL (ref 0.0–0.1)
Immature Granulocytes: 0 %
Lymphocytes Absolute: 2.4 10*3/uL (ref 0.7–3.1)
Lymphs: 32 %
MCH: 23 pg — ABNORMAL LOW (ref 26.6–33.0)
MCHC: 30.4 g/dL — ABNORMAL LOW (ref 31.5–35.7)
MCV: 76 fL — ABNORMAL LOW (ref 79–97)
Monocytes Absolute: 0.5 10*3/uL (ref 0.1–0.9)
Monocytes: 7 %
Neutrophils Absolute: 4.4 10*3/uL (ref 1.4–7.0)
Neutrophils: 59 %
Platelets: 390 10*3/uL (ref 150–450)
RBC: 4.92 x10E6/uL (ref 3.77–5.28)
RDW: 14.3 % (ref 11.7–15.4)
WBC: 7.5 10*3/uL (ref 3.4–10.8)

## 2021-02-16 LAB — HEMOGLOBIN A1C
Est. average glucose Bld gHb Est-mCnc: 280 mg/dL
Hgb A1c MFr Bld: 11.4 % — ABNORMAL HIGH (ref 4.8–5.6)

## 2021-02-16 LAB — BASIC METABOLIC PANEL
BUN/Creatinine Ratio: 9 (ref 9–23)
BUN: 7 mg/dL (ref 6–24)
CO2: 24 mmol/L (ref 20–29)
Calcium: 9.7 mg/dL (ref 8.7–10.2)
Chloride: 103 mmol/L (ref 96–106)
Creatinine, Ser: 0.82 mg/dL (ref 0.57–1.00)
Glucose: 131 mg/dL — ABNORMAL HIGH (ref 70–99)
Potassium: 4.3 mmol/L (ref 3.5–5.2)
Sodium: 141 mmol/L (ref 134–144)
eGFR: 89 mL/min/{1.73_m2} (ref >59)

## 2021-02-16 MED ORDER — POTASSIUM CITRATE ER 10 MEQ (1080 MG) PO TBCR
EXTENDED_RELEASE_TABLET | ORAL | 0 refills | Status: DC
  Filled 2021-02-16: qty 60, fill #0
  Filled 2021-02-24: qty 60, 30d supply, fill #0

## 2021-02-16 NOTE — Telephone Encounter (Signed)
Patient would like a refill on Potassium citrate

## 2021-02-16 NOTE — Addendum Note (Signed)
Addended by: Irineo Axon C on: 02/16/2021 04:07 PM   Modules accepted: Orders

## 2021-02-20 ENCOUNTER — Other Ambulatory Visit: Payer: Self-pay

## 2021-02-22 ENCOUNTER — Other Ambulatory Visit: Payer: Self-pay

## 2021-02-22 ENCOUNTER — Encounter: Payer: Self-pay | Admitting: Gerontology

## 2021-02-22 ENCOUNTER — Ambulatory Visit: Payer: Self-pay | Admitting: Gerontology

## 2021-02-22 VITALS — BP 133/84 | HR 79 | Temp 97.7°F | Ht 68.0 in | Wt 274.8 lb

## 2021-02-22 DIAGNOSIS — G629 Polyneuropathy, unspecified: Secondary | ICD-10-CM

## 2021-02-22 DIAGNOSIS — R600 Localized edema: Secondary | ICD-10-CM

## 2021-02-22 DIAGNOSIS — H538 Other visual disturbances: Secondary | ICD-10-CM

## 2021-02-22 DIAGNOSIS — Z8669 Personal history of other diseases of the nervous system and sense organs: Secondary | ICD-10-CM | POA: Insufficient documentation

## 2021-02-22 DIAGNOSIS — E1165 Type 2 diabetes mellitus with hyperglycemia: Secondary | ICD-10-CM

## 2021-02-22 LAB — GLUCOSE, POCT (MANUAL RESULT ENTRY): POC Glucose: 123 mg/dl — AB (ref 70–99)

## 2021-02-22 MED ORDER — SUMATRIPTAN SUCCINATE 50 MG PO TABS
50.0000 mg | ORAL_TABLET | Freq: Once | ORAL | 0 refills | Status: DC
  Filled 2021-02-22: qty 10, 30d supply, fill #0

## 2021-02-22 NOTE — Progress Notes (Signed)
Established Patient Office Visit  Subjective:  Patient ID: [name], female    DOB: 11-02-1975  Age: 45 y.o. MRN: [id number]  CC:  Chief Complaint  Patient presents with   Follow-up    Follow-up for diabetes. Recently sugars have been low, this morning was 175. Usually around 200.   Migraine    Increases in migraine severity. Pain to the point of having to lay down. Decreasing eyesight, having trouble reading up close (blurry vision) this is all the time.    Foot Pain    Right foot pain: 'thick and dead' feeling. 'Like the foot is going numb'    HPI [name] is a 45 year old female who has history of depression, type 2 diabetes with peripheral neuropathy, GERD, headache, kidney stones, hypertension presents for lab review. Her HgbA1c done on 02/15/21 increased from 10.4% to 11.4%. She checks her blood glucose bid. She states that her fasting blood glucose today was 176 mg/dl, but averages in the 200's.  Her blood glucose was 123 mg per DL when checked during visit.  She states that her vision is blurry, and has not had ophthalmology visit. She self discontinued her Trulicity, because she found out on the web MD that it could cause vision loss/retinopathy.  She denies hypoglycemic symptoms, reports occasional polydipsia.  She c/o numbness , spongy feeling to the planter of his right foot that has been going on for 1 week.  She self discontinued gabapentin stating that one of the side effects was forgetfulness and memory loss, and she does not want to take Cymbalta. She reports having a history of migraine and has being experiencing many episodes recently. She denies aura and taking Ibuprofen minimally relieve symptoms.  She also complains of intermittent peripheral edema that resolves with elevating her legs.  She states that swelling resolves with elevating her leg. She continues to follow up with Cardiology for evaluation of PVCs, lower extremity edema.  Overall, she  states that she is doing well and offers no further complaints.    Past Medical History:  Diagnosis Date   Depression    Diabetes mellitus without complication (HCC)    GERD (gastroesophageal reflux disease)    Headache    History of kidney stones    History of nephrolithiasis 2015   via CT imaging, including an obstructing stone   Hyperlipidemia    Hypertension 12/2019   per patient, she has never been treated for high blood pressure   Low back pain 12/2019   Neuromuscular disorder ([place])    neuropathy    Past Surgical History:  Procedure Laterality Date   CHOLECYSTECTOMY  1998   CYSTOSCOPY WITH STENT PLACEMENT Right 12/09/2019   Procedure: CYSTOSCOPY WITH STENT PLACEMENT;  Surgeon: Abbie Sons, MD;  Location: [facility];  Service: Urology;  Laterality: Right;   CYSTOSCOPY/RETROGRADE/URETEROSCOPY  12/29/2019   Procedure: CYSTOSCOPY/RETROGRADE/URETEROSCOPY;  Surgeon: Abbie Sons, MD;  Location: [facility];  Service: Urology;;    Family History  Problem Relation Age of Onset   Hypertension Mother    Heart disease Mother    Kidney failure Father    Sleep apnea Sister    Pancreatic cancer Maternal Grandmother    Diabetes Maternal Great-grandmother     Social History   Socioeconomic History   Marital status: Single    Spouse name: Not on file   Number of children: Not on file   Years of education: Not on file   Highest education level: Not on file  Occupational History   Occupation: not currently working  Tobacco Use   Smoking status: Never   Smokeless tobacco: Never  Vaping Use   Vaping Use: Never used  Substance and Sexual Activity   Alcohol use: No   Drug use: No   Sexual activity: Never  Other Topics Concern   Not on file  Social History Narrative   - Needs a blood sugar monitor       Patient said she is living with her mom and things are going ok. Mom provides for her food, transport, etc.       Patient wants to wait until next visit before any  information is shared.    Social Determinants of Health   Financial Resource Strain: Not on file  Food Insecurity: No Food Insecurity   Worried About Charity fundraiser in the Last Year: Never true   Ran Out of Food in the Last Year: Never true  Transportation Needs: No Transportation Needs   Lack of Transportation (Medical): No   Lack of Transportation (Non-Medical): No  Physical Activity: Not on file  Stress: Not on file  Social Connections: Not on file  Intimate Partner Violence: Not on file    Outpatient Medications Prior to Visit  Medication Sig Dispense Refill   chlorthalidone (HYGROTON) 25 MG tablet Take (1/2) tablet (12.5 mg total) by mouth once daily. 30 tablet 2   ibuprofen (ADVIL) 600 MG tablet Take 600 mg by mouth every 6 (six) hours as needed for mild pain or moderate pain.     insulin glargine (LANTUS) 100 UNIT/ML injection Inject 80 Units into the skin at bedtime.     Dulaglutide (TRULICITY) 4.5 EG/3.1DV SOPN Inject 4.5 mg into the skin once a week as directed. (Start after 1 month of 3 mg a week). (Patient not taking: Reported on 02/22/2021) 6 mL 4   Insulin Pen Needle (NOVOFINE PEN NEEDLE) 32G X 6 MM MISC USE AS DIRECTED. 200 each PRN   Insulin Pen Needle 32G X 4 MM MISC Use as directed with insulin 100 each 11   potassium citrate (UROCIT-K) 10 MEQ (1080 MG) SR tablet Take 1 tablet (10 mEq total) by mouth 2 (two) times daily with a meal. (Patient not taking: Reported on 02/22/2021) 60 tablet 0   No facility-administered medications prior to visit.    Allergies  Allergen Reactions   Penicillins Hives   Topiramate     Other reaction(s): Other (See Comments) Worsened migraines, N/V   Topiramate Er Nausea And Vomiting    Intensified Migraines    Valtrex [Valacyclovir] Itching    ROS Review of Systems  Constitutional: Negative.   Eyes:  Positive for visual disturbance (blurry vision).  Respiratory: Negative.    Cardiovascular: Negative.   Endocrine:  Positive for polydipsia. Negative for polyphagia and polyuria.  Musculoskeletal:        Edema to bilateral lower ankle and feet.  Skin: Negative.   Neurological:  Positive for headaches.     Objective:    Physical Exam HENT:     Head: Normocephalic and atraumatic.     Mouth/Throat:     Mouth: Mucous membranes are moist.  Eyes:     Extraocular Movements: Extraocular movements intact.     Conjunctiva/sclera: Conjunctivae normal.     Pupils: Pupils are equal, round, and reactive to light.  Cardiovascular:     Rate and Rhythm: Normal rate and regular rhythm.     Pulses: Normal pulses.     Heart  sounds: Normal heart sounds.  Pulmonary:     Effort: Pulmonary effort is normal.     Breath sounds: Normal breath sounds.  Musculoskeletal:        General: Swelling (+1 edema to bilateral ankle and feet) present.  Skin:    General: Skin is warm.  Neurological:     General: No focal deficit present.     Mental Status: She is alert and oriented to person, place, and time. Mental status is at baseline.    BP 133/84 (BP Location: Right Arm, Patient Position: Sitting, Cuff Size: Large)   Pulse 79   Temp 97.7 F (36.5 C)   Ht $R'5\' 8"'es$  (1.727 m)   Wt 274 lb 12.8 oz (124.6 kg)   SpO2 97%   BMI 41.78 kg/m  Wt Readings from Last 3 Encounters:  02/22/21 274 lb 12.8 oz (124.6 kg)  02/15/21 277 lb 4.8 oz (125.8 kg)  01/31/21 269 lb (122 kg)   Encouraged weight loss  Health Maintenance Due  Topic Date Due   COVID-19 Vaccine (1) Never done   Hepatitis C Screening  Never done   TETANUS/TDAP  Never done   PAP SMEAR-Modifier  Never done   OPHTHALMOLOGY EXAM  07/04/2018   COLONOSCOPY (Pts 45-56yrs Insurance coverage will need to be confirmed)  Never done   INFLUENZA VACCINE  Never done    There are no preventive care reminders to display for this patient.  Lab Results  Component Value Date   TSH 1.990 01/27/2021   Lab Results  Component Value Date   WBC 7.5 02/15/2021   HGB 11.3  02/15/2021   HCT 37.2 02/15/2021   MCV 76 (L) 02/15/2021   PLT 390 02/15/2021   Lab Results  Component Value Date   NA 141 02/15/2021   K 4.3 02/15/2021   CO2 24 02/15/2021   GLUCOSE 131 (H) 02/15/2021   BUN 7 02/15/2021   CREATININE 0.82 02/15/2021   BILITOT 0.4 08/31/2020   ALKPHOS 134 (H) 08/31/2020   AST 12 08/31/2020   ALT 15 08/31/2020   PROT 7.9 08/31/2020   ALBUMIN 4.4 08/31/2020   CALCIUM 9.7 02/15/2021   ANIONGAP 8 08/09/2020   EGFR 89 02/15/2021   Lab Results  Component Value Date   CHOL 142 11/11/2019   Lab Results  Component Value Date   HDL 32 (L) 11/11/2019   Lab Results  Component Value Date   LDLCALC 94 11/11/2019   Lab Results  Component Value Date   TRIG 84 11/11/2019   Lab Results  Component Value Date   CHOLHDL 4.4 11/11/2019   Lab Results  Component Value Date   HGBA1C 11.4 (H) 02/15/2021      Assessment & Plan:    1. History of migraine headaches -She was started on Imitrex, educated on medication side effect and was advised to notify clinic. - SUMAtriptan (IMITREX) 50 MG tablet; Take 1 tablet (50 mg total) by mouth once daily for 1 dose for migraines. May repeat in 2 hours if headache persists or recurs. (Max of 2 tablets in 24 hours)  Dispense: 10 tablet; Refill: 0  2. Peripheral polyneuropathy -She has decreased sensation to feet with monofilament test, she was advised to wear tennis shoe and not to walk barefooted.  She self discontinued gabapentin and does not want to start on Cymbalta.  She was advised to go to the emergency room for worsening symptoms and tighter glycemic control.  3. Blurry vision -She was advised to complete [facility]  financial application for [facility] to [facility].  She was advised to go to the emergency room for worsening vision.  4. Type 2 diabetes mellitus with hyperglycemia, with long-term current use of insulin (HCC) -Her hemoglobin A1c was 11.4% and her goal should be less than 7%.  She self  discontinued her Trulicity and continues on 80 units of glargine.  She was advised to check her blood glucose 3 times a day,record, bring log to follow-up appointment.  She was advised that her fasting blood glucose reading goal should be between 80 and 130 mg per DL.  She was advised to continue on low carbohydrate/no concentrated sweet diet and exercise as tolerated. - POCT Glucose (CBG); Future - POCT Glucose (CBG)   5. Bilateral edema of lower extremity She was advised that her legs while sitting down and to follow-up with her cardiology appointment.  She was advised to go to the emergency room with erythema, and claudication.   Follow-up: Return in about 3 weeks (around 03/15/2021), or if symptoms worsen or fail to improve.    Azharia Surratt Jerold Coombe, NP

## 2021-02-22 NOTE — Patient Instructions (Signed)
Carbohydrate Counting for Diabetes Mellitus, Adult Carbohydrate counting is a method of keeping track of how many carbohydrates you eat. Eating carbohydrates naturally increases the amount of sugar (glucose) in the blood. Counting how many carbohydrates you eat improves your blood glucose control, which helps you manage your diabetes. It is important to know how many carbohydrates you can safely have in each meal. This is different for every person. A dietitian can help you make a meal plan and calculate how many carbohydrates you should have at each meal and snack. What foods contain carbohydrates? Carbohydrates are found in the following foods: Grains, such as breads and cereals. Dried beans and soy products. Starchy vegetables, such as potatoes, peas, and corn. Fruit and fruit juices. Milk and yogurt. Sweets and snack foods, such as cake, cookies, candy, chips, and soft drinks. How do I count carbohydrates in foods? There are two ways to count carbohydrates in food. You can read food labels or learn standard serving sizes of foods. You can use either of the methods or a combination of both. Using the Nutrition Facts label The Nutrition Facts list is included on the labels of almost all packaged foods and beverages in the [place] It includes: The serving size. Information about nutrients in each serving, including the grams (g) of carbohydrate per serving. To use the Nutrition Facts: Decide how many servings you will have. Multiply the number of servings by the number of carbohydrates per serving. The resulting number is the total amount of carbohydrates that you will be having. Learning the standard serving sizes of foods When you eat carbohydrate foods that are not packaged or do not include Nutrition Facts on the label, you need to measure the servings in order to count the amount of carbohydrates. Measure the foods that you will eat with a food scale or measuring cup, if needed. Decide how  many standard-size servings you will eat. Multiply the number of servings by 15. For foods that contain carbohydrates, one serving equals 15 g of carbohydrates. For example, if you eat 2 cups or 10 oz (300 g) of strawberries, you will have eaten 2 servings and 30 g of carbohydrates (2 servings x 15 g = 30 g). For foods that have more than one food mixed, such as soups and casseroles, you must count the carbohydrates in each food that is included. The following list contains standard serving sizes of common carbohydrate-rich foods. Each of these servings has about 15 g of carbohydrates: 1 slice of bread. 1 six-inch (15 cm) tortilla. ? cup or 2 oz (53 g) cooked rice or pasta.  cup or 3 oz (85 g) cooked or canned, drained and rinsed beans or lentils.  cup or 3 oz (85 g) starchy vegetable, such as peas, corn, or squash.  cup or 4 oz (120 g) hot cereal.  cup or 3 oz (85 g) boiled or mashed potatoes, or  or 3 oz (85 g) of a large baked potato.  cup or 4 fl oz (118 mL) fruit juice. 1 cup or 8 fl oz (237 mL) milk. 1 small or 4 oz (106 g) apple.  or 2 oz (63 g) of a medium banana. 1 cup or 5 oz (150 g) strawberries. 3 cups or 1 oz (24 g) popped popcorn. What is an example of carbohydrate counting? To calculate the number of carbohydrates in this sample meal, follow the steps shown below. Sample meal 3 oz (85 g) chicken breast. ? cup or 4 oz (106 g) brown   rice.  cup or 3 oz (85 g) corn. 1 cup or 8 fl oz (237 mL) milk. 1 cup or 5 oz (150 g) strawberries with sugar-free whipped topping. Carbohydrate calculation Identify the foods that contain carbohydrates: Rice. Corn. Milk. Strawberries. Calculate how many servings you have of each food: 2 servings rice. 1 serving corn. 1 serving milk. 1 serving strawberries. Multiply each number of servings by 15 g: 2 servings rice x 15 g = 30 g. 1 serving corn x 15 g = 15 g. 1 serving milk x 15 g = 15 g. 1 serving strawberries x 15 g = 15  g. Add together all of the amounts to find the total grams of carbohydrates eaten: 30 g + 15 g + 15 g + 15 g = 75 g of carbohydrates total. What are tips for following this plan? Shopping Develop a meal plan and then make a shopping list. Buy fresh and frozen vegetables, fresh and frozen fruit, dairy, eggs, beans, lentils, and whole grains. Look at food labels. Choose foods that have more fiber and less sugar. Avoid processed foods and foods with added sugars. Meal planning Aim to have the same amount of carbohydrates at each meal and for each snack time. Plan to have regular, balanced meals and snacks. Where to find more information American Diabetes Association: www.diabetes.org Centers for Disease Control and Prevention: www.cdc.gov Summary Carbohydrate counting is a method of keeping track of how many carbohydrates you eat. Eating carbohydrates naturally increases the amount of sugar (glucose) in the blood. Counting how many carbohydrates you eat improves your blood glucose control, which helps you manage your diabetes. A dietitian can help you make a meal plan and calculate how many carbohydrates you should have at each meal and snack. This information is not intended to replace advice given to you by your health care provider. Make sure you discuss any questions you have with your health care provider. Document Revised: 04/30/2019 Document Reviewed: 05/01/2019 Elsevier Patient Education  2021 Elsevier Inc.  

## 2021-02-24 ENCOUNTER — Other Ambulatory Visit: Payer: Self-pay

## 2021-02-28 ENCOUNTER — Ambulatory Visit: Payer: Self-pay

## 2021-02-28 ENCOUNTER — Other Ambulatory Visit: Payer: Self-pay

## 2021-03-07 ENCOUNTER — Other Ambulatory Visit: Payer: Self-pay | Admitting: *Deleted

## 2021-03-07 DIAGNOSIS — N200 Calculus of kidney: Secondary | ICD-10-CM

## 2021-03-08 ENCOUNTER — Ambulatory Visit
Admission: RE | Admit: 2021-03-08 | Discharge: 2021-03-08 | Disposition: A | Discharge status: Home / Self Care | Payer: Self-pay | Attending: Urology | Admitting: Urology

## 2021-03-08 ENCOUNTER — Ambulatory Visit
Admission: RE | Admit: 2021-03-08 | Discharge: 2021-03-08 | Disposition: A | Discharge status: Home / Self Care | Payer: Self-pay | Source: Ambulatory Visit | Attending: Urology | Admitting: Urology

## 2021-03-08 ENCOUNTER — Other Ambulatory Visit: Payer: Self-pay

## 2021-03-08 ENCOUNTER — Encounter: Payer: Self-pay | Admitting: Urology

## 2021-03-08 ENCOUNTER — Ambulatory Visit (INDEPENDENT_AMBULATORY_CARE_PROVIDER_SITE_OTHER): Payer: Self-pay | Admitting: Urology

## 2021-03-08 VITALS — BP 121/66 | HR 82 | Ht 68.0 in | Wt 272.0 lb

## 2021-03-08 DIAGNOSIS — N200 Calculus of kidney: Secondary | ICD-10-CM | POA: Insufficient documentation

## 2021-03-08 DIAGNOSIS — R1032 Left lower quadrant pain: Secondary | ICD-10-CM

## 2021-03-08 NOTE — Progress Notes (Signed)
03/08/2021 1:30 PM   [name] 1976/03/25 [id number]  Referring provider: Rolm Gala, NP [address] [place],  [place] [address]  Chief Complaint  Patient presents with   Nephrolithiasis    Urologic history: 1.  Recurrent stone disease -Right ureteral stent placement 11/2019 for right proximal ureteral calculus with sepsis -Right ureteroscopy 12/29/2019 with calculus no longer present; Randall's plaques midpole calyces -Metabolic evaluation with hyperoxaluria, low urine pH and hyperuricosuria -Started potassium citrate and discussed dietary factors of increased protein and oxalate rich foods  HPI: 45 y.o. female presents for follow-up.  Since last visit has noted some left lower quadrant/groin discomfort + Urinary frequency Denies dysuria, gross hematuria Remains on potassium citrate KUB performed today was reviewed.  Only 1 calcification is seen overlying the right renal outline when compared with the KUB April 2022.  Faint calcification overlying the left renal outline.  No calcifications are seen overlying the expected course of the left ureter   PMH: Past Medical History:  Diagnosis Date   Depression    Diabetes mellitus without complication (HCC)    GERD (gastroesophageal reflux disease)    Headache    History of kidney stones    History of nephrolithiasis 2015   via CT imaging, including an obstructing stone   Hyperlipidemia    Hypertension 12/2019   per patient, she has never been treated for high blood pressure   Low back pain 12/2019   Neuromuscular disorder (HCC)    neuropathy    Surgical History: Past Surgical History:  Procedure Laterality Date   CHOLECYSTECTOMY  1998   CYSTOSCOPY WITH STENT PLACEMENT Right 12/09/2019   Procedure: CYSTOSCOPY WITH STENT PLACEMENT;  Surgeon: Riki Altes, MD;  Location: [facility] ORS;  Service: Urology;  Laterality: Right;   CYSTOSCOPY/RETROGRADE/URETEROSCOPY  12/29/2019   Procedure:  CYSTOSCOPY/RETROGRADE/URETEROSCOPY;  Surgeon: Riki Altes, MD;  Location: [facility] ORS;  Service: Urology;;    Home Medications:  Allergies as of 03/08/2021       Reactions   Penicillins Hives   Topiramate    Other reaction(s): Other (See Comments) Worsened migraines, N/V   Topiramate Er Nausea And Vomiting   Intensified Migraines   Valtrex [valacyclovir] Itching        Medication List        Accurate as of March 08, 2021  1:30 PM. If you have any questions, ask your nurse or doctor.          STOP taking these medications    Trulicity 4.5 MG/0.5ML Sopn Generic drug: Dulaglutide Stopped by: Riki Altes, MD       TAKE these medications    chlorthalidone 25 MG tablet Commonly known as: HYGROTON Take (1/2) tablet (12.5 mg total) by mouth once daily.   Comfort EZ Pen Needles 32G X 4 MM Misc Generic drug: Insulin Pen Needle Use as directed with insulin   Novofine Pen Needle 32G X 6 MM Misc Generic drug: Insulin Pen Needle USE AS DIRECTED.   ibuprofen 600 MG tablet Commonly known as: ADVIL Take 600 mg by mouth every 6 (six) hours as needed for mild pain or moderate pain.   insulin glargine 100 UNIT/ML injection Commonly known as: LANTUS Inject 80 Units into the skin at bedtime.   potassium citrate 10 MEQ (1080 MG) SR tablet Commonly known as: UROCIT-K Take 1 tablet (10 mEq total) by mouth 2 (two) times daily with a meal.   SUMAtriptan 50 MG tablet Commonly known as: Imitrex Take 1  tablet (50 mg total) by mouth once daily for 1 dose for migraines. May repeat in 2 hours if headache persists or recurs. (Max of 2 tablets in 24 hours)        Allergies:  Allergies  Allergen Reactions   Penicillins Hives   Topiramate     Other reaction(s): Other (See Comments) Worsened migraines, N/V   Topiramate Er Nausea And Vomiting    Intensified Migraines    Valtrex [Valacyclovir] Itching    Family History: Family History  Problem Relation Age of  Onset   Hypertension Mother    Heart disease Mother    Kidney failure Father    Sleep apnea Sister    Pancreatic cancer Maternal Grandmother    Diabetes Maternal Great-grandmother     Social History:  reports that she has never smoked. She has never used smokeless tobacco. She reports that she does not drink alcohol and does not use drugs.   Physical Exam: BP 121/66   Pulse 82   Ht 5\' 8"  (1.727 m)   Wt 272 lb (123.4 kg)   BMI 41.36 kg/m   Constitutional:  Alert and oriented, No acute distress. HEENT: [place] AT, moist mucus membranes.  Trachea midline, no masses. Cardiovascular: No clubbing, cyanosis, or edema. Respiratory: Normal respiratory effort, no increased work of breathing. Psychiatric: Normal mood and affect.   Pertinent Imaging: KUB images were personally reviewed and interpreted   Assessment & Plan:    1.  Nephrolithiasis Of 2 calcifications previously seen overlying the right renal outline only one is visualized today  2.  Left lower quadrant abdominal pain New problem Schedule stone protocol CT abdomen pelvis for evaluation of her left lower quadrant abdominal pain and to evaluate for migration of a right renal calculus   , MD  [facility] [address], [address] [place], [place] [id number]

## 2021-03-10 ENCOUNTER — Other Ambulatory Visit: Payer: Self-pay

## 2021-03-10 ENCOUNTER — Encounter: Payer: Self-pay | Admitting: Cardiology

## 2021-03-10 ENCOUNTER — Ambulatory Visit (INDEPENDENT_AMBULATORY_CARE_PROVIDER_SITE_OTHER): Payer: Self-pay | Admitting: Cardiology

## 2021-03-10 VITALS — BP 110/62 | HR 90 | Ht 68.0 in | Wt 272.0 lb

## 2021-03-10 DIAGNOSIS — I10 Essential (primary) hypertension: Secondary | ICD-10-CM

## 2021-03-10 DIAGNOSIS — I5189 Other ill-defined heart diseases: Secondary | ICD-10-CM

## 2021-03-10 DIAGNOSIS — I493 Ventricular premature depolarization: Secondary | ICD-10-CM

## 2021-03-10 MED ORDER — METOPROLOL SUCCINATE ER 25 MG PO TB24
25.0000 mg | ORAL_TABLET | Freq: Every day | ORAL | 1 refills | Status: DC
  Filled 2021-03-10: qty 90, 90d supply, fill #0
  Filled 2021-07-04: qty 30, 30d supply, fill #1
  Filled 2021-09-18: qty 30, 30d supply, fill #2
  Filled 2021-10-30: qty 30, 30d supply, fill #0
  Filled 2021-10-30: qty 30, 30d supply, fill #3

## 2021-03-10 MED ORDER — TORSEMIDE 20 MG PO TABS
20.0000 mg | ORAL_TABLET | Freq: Every day | ORAL | 1 refills | Status: DC
  Filled 2021-03-10: qty 90, 90d supply, fill #0
  Filled 2021-07-04: qty 76, 76d supply, fill #1
  Filled 2021-09-18: qty 14, 14d supply, fill #2

## 2021-03-10 NOTE — Patient Instructions (Signed)
Medication Instructions:   Your physician has recommended you make the following change in your medication:    STOP taking Chlorthalidone.   2.    START taking Torsemide 20 MG once a day.  3.    START taking Toprol XL 25 MG once a day.   *If you need a refill on your cardiac medications before your next appointment, please call your pharmacy*   Lab Work:  Your physician recommends that you return for lab work (BMP) in: 1 WEEK   Please return to our office on_____________________at______________am/pm     Testing/Procedures: None Ordered   Follow-Up: At [facility], you and your health needs are our priority.  As part of our continuing mission to provide you with exceptional heart care, we have created designated Provider Care Teams.  These Care Teams include your primary Cardiologist (physician) and Advanced Practice Providers (APPs -  Physician Assistants and Nurse Practitioners) who all work together to provide you with the care you need, when you need it.  We recommend signing up for the patient portal called "MyChart".  Sign up information is provided on this After Visit Summary.  MyChart is used to connect with patients for Virtual Visits (Telemedicine).  Patients are able to view lab/test results, encounter notes, upcoming appointments, etc.  Non-urgent messages can be sent to your provider as well.   To learn more about what you can do with MyChart, go to [email or web address].    Your next appointment:   3 month(s)  The format for your next appointment:   In Person  Provider:   You may see Debbe Odea, MD or one of the following Advanced Practice Providers on your designated Care Team:   Nicolasa Ducking, NP Eula Listen, PA-C Marisue Ivan, PA-C Cadence Fransico Michael, [place]   Other Instructions

## 2021-03-10 NOTE — Progress Notes (Signed)
Cardiology Office Note:    Date:  03/10/2021   ID:  [name], DOB May 07, 1976, MRN [id number]  PCP:  Rolm Gala, NP   [facility] Providers Cardiologist:  Debbe Odea, MD     Referring MD: Rolm Gala, NP   Chief Complaint  Patient presents with   Other    6 week follow up. Patient c.o swelling in ankles. Meds reviewed verbally with patient.     History of Present Illness:    [name] is a 45 y.o. female with a hx of hypertension, diabetes, obesity who presents for follow up.    Seen in the past due to chest discomfort deemed noncardiac/musculoskeletal.  Echocardiogram obtained 11/2020 showed preserved ejection fraction.  EKG on last visit frequent PVCs were noted.  Patient had a cardiac monitor placed, revealing PVC burden of 11.9%.  She takes chlorthalidone for both blood pressure and edema.  She still complains of edema despite taking chlorthalidone.  Has occasional chest discomfort not associated with exertion.  Chest pain usually coincide with migraine attacks with radiation to either right or left arm.  Prior notes Echo 11/2020 EF 60 to 65%, grade 2 diastolic dysfunction Cardiac monitor 01/2021, frequent PVCs 11.9% burden   Past Medical History:  Diagnosis Date   Depression    Diabetes mellitus without complication (HCC)    GERD (gastroesophageal reflux disease)    Headache    History of kidney stones    History of nephrolithiasis 2015   via CT imaging, including an obstructing stone   Hyperlipidemia    Hypertension 12/2019   per patient, she has never been treated for high blood pressure   Low back pain 12/2019   Neuromuscular disorder (HCC)    neuropathy    Past Surgical History:  Procedure Laterality Date   CHOLECYSTECTOMY  1998   CYSTOSCOPY WITH STENT PLACEMENT Right 12/09/2019   Procedure: CYSTOSCOPY WITH STENT PLACEMENT;  Surgeon: Riki Altes, MD;  Location: [facility] ORS;  Service: Urology;  Laterality:  Right;   CYSTOSCOPY/RETROGRADE/URETEROSCOPY  12/29/2019   Procedure: CYSTOSCOPY/RETROGRADE/URETEROSCOPY;  Surgeon: Riki Altes, MD;  Location: [facility] ORS;  Service: Urology;;    Current Medications: Current Meds  Medication Sig   ibuprofen (ADVIL) 600 MG tablet Take 600 mg by mouth every 6 (six) hours as needed for mild pain or moderate pain.   insulin glargine (LANTUS) 100 UNIT/ML injection Inject 80 Units into the skin at bedtime.   Insulin Pen Needle (NOVOFINE PEN NEEDLE) 32G X 6 MM MISC USE AS DIRECTED.   Insulin Pen Needle 32G X 4 MM MISC Use as directed with insulin   metoprolol succinate (TOPROL XL) 25 MG 24 hr tablet Take 1 tablet (25 mg total) by mouth once daily.   potassium citrate (UROCIT-K) 10 MEQ (1080 MG) SR tablet Take 1 tablet (10 mEq total) by mouth 2 (two) times daily with a meal.   SUMAtriptan (IMITREX) 50 MG tablet Take 1 tablet (50 mg total) by mouth once daily for 1 dose for migraines. May repeat in 2 hours if headache persists or recurs. (Max of 2 tablets in 24 hours)   torsemide (DEMADEX) 20 MG tablet Take 1 tablet (20 mg total) by mouth once daily.   [DISCONTINUED] chlorthalidone (HYGROTON) 25 MG tablet Take (1/2) tablet (12.5 mg total) by mouth once daily.     Allergies:   Penicillins, Topiramate, Topiramate er, and Valtrex [valacyclovir]   Social History   Socioeconomic History   Marital status: Single  Spouse name: Not on file   Number of children: Not on file   Years of education: Not on file   Highest education level: Not on file  Occupational History   Occupation: not currently working  Tobacco Use   Smoking status: Never   Smokeless tobacco: Never  Vaping Use   Vaping Use: Never used  Substance and Sexual Activity   Alcohol use: No   Drug use: No   Sexual activity: Never  Other Topics Concern   Not on file  Social History Narrative   - Needs a blood sugar monitor       Patient said she is living with her mom and things are going ok.  Mom provides for her food, transport, etc.       Patient wants to wait until next visit before any information is shared.    Social Determinants of Health   Financial Resource Strain: Not on file  Food Insecurity: No Food Insecurity   Worried About Programme researcher, broadcasting/film/video in the Last Year: Never true   Ran Out of Food in the Last Year: Never true  Transportation Needs: No Transportation Needs   Lack of Transportation (Medical): No   Lack of Transportation (Non-Medical): No  Physical Activity: Not on file  Stress: Not on file  Social Connections: Not on file     Family History: The patient's family history includes Diabetes in her maternal great-grandmother; Heart disease in her mother; Hypertension in her mother; Kidney failure in her father; Pancreatic cancer in her maternal grandmother; Sleep apnea in her sister.  ROS:   Please see the history of present illness.     All other systems reviewed and are negative.  EKGs/Labs/Other Studies Reviewed:    The following studies were reviewed today:   EKG:  EKG not ordered today.   Recent Labs: 03/23/2020: BNP 18.2 08/31/2020: ALT 15 01/27/2021: Magnesium 1.6; TSH 1.990 02/15/2021: BUN 7; Creatinine, Ser 0.82; Hemoglobin 11.3; Platelets 390; Potassium 4.3; Sodium 141  Recent Lipid Panel    Component Value Date/Time   CHOL 142 11/11/2019 1254   TRIG 84 11/11/2019 1254   HDL 32 (L) 11/11/2019 1254   CHOLHDL 4.4 11/11/2019 1254   CHOLHDL 4.3 03/29/2007 0615   VLDL 17 03/29/2007 0615   LDLCALC 94 11/11/2019 1254     Risk Assessment/Calculations:      Physical Exam:    VS:  BP 110/62 (BP Location: Left Arm, Patient Position: Sitting, Cuff Size: Large)   Pulse 90   Ht 5\' 8"  (1.727 m)   Wt 272 lb (123.4 kg)   SpO2 98%   BMI 41.36 kg/m     Wt Readings from Last 3 Encounters:  03/10/21 272 lb (123.4 kg)  03/08/21 272 lb (123.4 kg)  02/22/21 274 lb 12.8 oz (124.6 kg)     GEN:  Well nourished, well developed in no acute  distress HEENT: Normal NECK: No JVD; No carotid bruits LYMPHATICS: No lymphadenopathy CARDIAC: RRR, no murmurs, rubs, gallops RESPIRATORY:  Clear to auscultation without rales, wheezing or rhonchi  ABDOMEN: Soft, non-tender, non-distended MUSCULOSKELETAL:  1-2+ edema SKIN: Warm and dry NEUROLOGIC:  Alert and oriented x 3 PSYCHIATRIC:  Normal affect   ASSESSMENT:    1. Frequent PVCs   2. Diastolic dysfunction   3. Primary hypertension   4. Morbid obesity (HCC)    PLAN:    In order of problems listed above:  Frequent PVCs noted on cardiac monitor 11.9% burden, echo 11/2020 with preserved  ejection fraction.  Start Toprol-XL 25 mg daily. Diastolic dysfunction, edema.  Etiology likely morbid obesity.  Start torsemide 20 mg daily.  Check BMP in 1 week. Hypertension, BP controlled.  Stop chlorthalidone, start torsemide, Toprol-XL. Morbid obesity, low calorie diet advised.   Follow-up in 3 months.   Medication Adjustments/Labs and Tests Ordered: Current medicines are reviewed at length with the patient today.  Concerns regarding medicines are outlined above.  Orders Placed This Encounter  Procedures   Basic metabolic panel    Meds ordered this encounter  Medications   torsemide (DEMADEX) 20 MG tablet    Sig: Take 1 tablet (20 mg total) by mouth once daily.    Dispense:  90 tablet    Refill:  1   metoprolol succinate (TOPROL XL) 25 MG 24 hr tablet    Sig: Take 1 tablet (25 mg total) by mouth once daily.    Dispense:  90 tablet    Refill:  1     Patient Instructions  Medication Instructions:   Your physician has recommended you make the following change in your medication:    STOP taking Chlorthalidone.   2.    START taking Torsemide 20 MG once a day.  3.    START taking Toprol XL 25 MG once a day.   *If you need a refill on your cardiac medications before your next appointment, please call your pharmacy*   Lab Work:  Your physician recommends that you return  for lab work (BMP) in: 1 WEEK   Please return to our office on_____________________at______________am/pm     Testing/Procedures: None Ordered   Follow-Up: At [facility], you and your health needs are our priority.  As part of our continuing mission to provide you with exceptional heart care, we have created designated Provider Care Teams.  These Care Teams include your primary Cardiologist (physician) and Advanced Practice Providers (APPs -  Physician Assistants and Nurse Practitioners) who all work together to provide you with the care you need, when you need it.  We recommend signing up for the patient portal called "MyChart".  Sign up information is provided on this After Visit Summary.  MyChart is used to connect with patients for Virtual Visits (Telemedicine).  Patients are able to view lab/test results, encounter notes, upcoming appointments, etc.  Non-urgent messages can be sent to your provider as well.   To learn more about what you can do with MyChart, go to [email or web address].    Your next appointment:   3 month(s)  The format for your next appointment:   In Person  Provider:   You may see Debbe Odea, MD or one of the following Advanced Practice Providers on your designated Care Team:   Nicolasa Ducking, NP Eula Listen, PA-C Marisue Ivan, PA-C Cadence [place], [place]   Other Instructions    Signed, Debbe Odea, MD  03/10/2021 12:44 PM    [facility]

## 2021-03-12 ENCOUNTER — Other Ambulatory Visit: Payer: Self-pay | Admitting: Urology

## 2021-03-15 ENCOUNTER — Encounter: Payer: Self-pay | Admitting: Gerontology

## 2021-03-15 ENCOUNTER — Other Ambulatory Visit: Payer: Self-pay

## 2021-03-15 ENCOUNTER — Ambulatory Visit: Payer: Self-pay | Admitting: Gerontology

## 2021-03-15 VITALS — BP 104/69 | HR 60 | Temp 99.5°F | Ht 68.0 in | Wt 274.8 lb

## 2021-03-15 DIAGNOSIS — E1165 Type 2 diabetes mellitus with hyperglycemia: Secondary | ICD-10-CM

## 2021-03-15 DIAGNOSIS — Z794 Long term (current) use of insulin: Secondary | ICD-10-CM

## 2021-03-15 DIAGNOSIS — R21 Rash and other nonspecific skin eruption: Secondary | ICD-10-CM

## 2021-03-15 LAB — GLUCOSE, POCT (MANUAL RESULT ENTRY): POC Glucose: 255 mg/dl — AB (ref 70–99)

## 2021-03-15 NOTE — Patient Instructions (Signed)
Carbohydrate Counting for Diabetes Mellitus, Adult Carbohydrate counting is a method of keeping track of how many carbohydrates you eat. Eating carbohydrates naturally increases the amount of sugar (glucose) in the blood. Counting how many carbohydrates you eat improves your blood glucose control, which helps you manage your diabetes. It is important to know how many carbohydrates you can safely have in each meal. This is different for every person. A dietitian can help you make a meal plan and calculate how many carbohydrates you should have at each meal and snack. What foods contain carbohydrates? Carbohydrates are found in the following foods: Grains, such as breads and cereals. Dried beans and soy products. Starchy vegetables, such as potatoes, peas, and corn. Fruit and fruit juices. Milk and yogurt. Sweets and snack foods, such as cake, cookies, candy, chips, and soft drinks. How do I count carbohydrates in foods? There are two ways to count carbohydrates in food. You can read food labels or learn standard serving sizes of foods. You can use either of the methods or a combination of both. Using the Nutrition Facts label The Nutrition Facts list is included on the labels of almost all packaged foods and beverages in the [place] It includes: The serving size. Information about nutrients in each serving, including the grams (g) of carbohydrate per serving. To use the Nutrition Facts: Decide how many servings you will have. Multiply the number of servings by the number of carbohydrates per serving. The resulting number is the total amount of carbohydrates that you will be having. Learning the standard serving sizes of foods When you eat carbohydrate foods that are not packaged or do not include Nutrition Facts on the label, you need to measure the servings in order to count the amount of carbohydrates. Measure the foods that you will eat with a food scale or measuring cup, if needed. Decide how  many standard-size servings you will eat. Multiply the number of servings by 15. For foods that contain carbohydrates, one serving equals 15 g of carbohydrates. For example, if you eat 2 cups or 10 oz (300 g) of strawberries, you will have eaten 2 servings and 30 g of carbohydrates (2 servings x 15 g = 30 g). For foods that have more than one food mixed, such as soups and casseroles, you must count the carbohydrates in each food that is included. The following list contains standard serving sizes of common carbohydrate-rich foods. Each of these servings has about 15 g of carbohydrates: 1 slice of bread. 1 six-inch (15 cm) tortilla. ? cup or 2 oz (53 g) cooked rice or pasta.  cup or 3 oz (85 g) cooked or canned, drained and rinsed beans or lentils.  cup or 3 oz (85 g) starchy vegetable, such as peas, corn, or squash.  cup or 4 oz (120 g) hot cereal.  cup or 3 oz (85 g) boiled or mashed potatoes, or  or 3 oz (85 g) of a large baked potato.  cup or 4 fl oz (118 mL) fruit juice. 1 cup or 8 fl oz (237 mL) milk. 1 small or 4 oz (106 g) apple.  or 2 oz (63 g) of a medium banana. 1 cup or 5 oz (150 g) strawberries. 3 cups or 1 oz (24 g) popped popcorn. What is an example of carbohydrate counting? To calculate the number of carbohydrates in this sample meal, follow the steps shown below. Sample meal 3 oz (85 g) chicken breast. ? cup or 4 oz (106 g) brown   rice.  cup or 3 oz (85 g) corn. 1 cup or 8 fl oz (237 mL) milk. 1 cup or 5 oz (150 g) strawberries with sugar-free whipped topping. Carbohydrate calculation Identify the foods that contain carbohydrates: Rice. Corn. Milk. Strawberries. Calculate how many servings you have of each food: 2 servings rice. 1 serving corn. 1 serving milk. 1 serving strawberries. Multiply each number of servings by 15 g: 2 servings rice x 15 g = 30 g. 1 serving corn x 15 g = 15 g. 1 serving milk x 15 g = 15 g. 1 serving strawberries x 15 g = 15  g. Add together all of the amounts to find the total grams of carbohydrates eaten: 30 g + 15 g + 15 g + 15 g = 75 g of carbohydrates total. What are tips for following this plan? Shopping Develop a meal plan and then make a shopping list. Buy fresh and frozen vegetables, fresh and frozen fruit, dairy, eggs, beans, lentils, and whole grains. Look at food labels. Choose foods that have more fiber and less sugar. Avoid processed foods and foods with added sugars. Meal planning Aim to have the same amount of carbohydrates at each meal and for each snack time. Plan to have regular, balanced meals and snacks. Where to find more information American Diabetes Association: www.diabetes.org Centers for Disease Control and Prevention: www.cdc.gov Summary Carbohydrate counting is a method of keeping track of how many carbohydrates you eat. Eating carbohydrates naturally increases the amount of sugar (glucose) in the blood. Counting how many carbohydrates you eat improves your blood glucose control, which helps you manage your diabetes. A dietitian can help you make a meal plan and calculate how many carbohydrates you should have at each meal and snack. This information is not intended to replace advice given to you by your health care provider. Make sure you discuss any questions you have with your health care provider. Document Revised: 04/30/2019 Document Reviewed: 05/01/2019 Elsevier Patient Education  2021 Elsevier Inc.  

## 2021-03-15 NOTE — Progress Notes (Signed)
Established Patient Office Visit  Subjective:  Patient ID: [name], female    DOB: 02/08/1976  Age: 45 y.o. MRN: [id number]  CC:  Chief Complaint  Patient presents with   Follow-up    Follow-up for diabetes. Sugars this morning were 170. Pt reports that sugars fluctuate between 130-430 (occassionally).    Rash    Pt reports rash on right shoulder for a few weeks. Trying OTC ointment that made it worse. Stretched from shoulder into left breast area. Trying calamine lotion now.     HPI [name] is a 45 year old female who has history of depression, type 2 diabetes with peripheral neuropathy, GERD, headache, kidney stones, hypertension presents for routine follow up. Her last HgbA1c was done on 02/15/21 was 11.4%, she self discontinued her Trulicity and only takes 80 units of Lantus. She checks her blood glucose bid, forgot her blood glucose meter. She states that her fasting reading today was 170 mg/dl. She states that she has not eating and her blood glucose was 255 mg/dl. She denies hypoglycemic symptoms, peripheral neuropathy but occasional hyperglycemic symptoms. She was seen by Urologist Dr Bernardo Heater on 03/08/21 for recurrent kidney stone, CT abdomen and pelvis was recommended to evaluate for migration renal calculus. She also followed up with Cardiologist Dr Garen Lah on 03/10/21 for bilateral lower extremity edema and frequent PVC's. She was started on Toprol XL 25 mg and Torsemide 20 mg, chlorthalidone was discontinued. She states that she has not picked up the medication from [facility]. She's wearing compression stockings, states that peripheral edema resolves with elevation of her legs. Currently, she c/o of pruritic rash to left shoulder that extends to her left chest. She states that rash started more than 3 weeks ago and application of otc ointment worsened rash per patient that it spread to her left chest and arm. She started applying otc  calamine lotion 2 days ago with great improvement. She denies contact with any irritant, changing her detergent or body soap. Overall, she states that she's doing well and offers no further complaint.  Past Medical History:  Diagnosis Date   Depression    Diabetes mellitus without complication (HCC)    GERD (gastroesophageal reflux disease)    Headache    History of kidney stones    History of nephrolithiasis 2015   via CT imaging, including an obstructing stone   Hyperlipidemia    Hypertension 12/2019   per patient, she has never been treated for high blood pressure   Low back pain 12/2019   Neuromuscular disorder ([place])    neuropathy    Past Surgical History:  Procedure Laterality Date   CHOLECYSTECTOMY  1998   CYSTOSCOPY WITH STENT PLACEMENT Right 12/09/2019   Procedure: CYSTOSCOPY WITH STENT PLACEMENT;  Surgeon: Abbie Sons, MD;  Location: [facility] ORS;  Service: Urology;  Laterality: Right;   CYSTOSCOPY/RETROGRADE/URETEROSCOPY  12/29/2019   Procedure: CYSTOSCOPY/RETROGRADE/URETEROSCOPY;  Surgeon: Abbie Sons, MD;  Location: [facility] OR[facility];  Service: Urology;;    Family History  Problem Relation Age of Onset   Hypertension Mother    Heart disease Mother    Kidney failure Father    Sleep apnea Sister    Pancreatic cancer Maternal Grandmother    Diabetes Maternal Great-grandmother     Social History   Socioeconomic History   Marital status: Single    Spouse name: Not on file   Number of children: Not on file   Years of education: Not on file   Highest  education level: Not on file  Occupational History   Occupation: not currently working  Tobacco Use   Smoking status: Never   Smokeless tobacco: Never  Vaping Use   Vaping Use: Never used  Substance and Sexual Activity   Alcohol use: No   Drug use: No   Sexual activity: Never  Other Topics Concern   Not on file  Social History Narrative   - Needs a blood sugar monitor       Patient said she is living with her  mom and things are going ok. Mom provides for her food, transport, etc.       Patient wants to wait until next visit before any information is shared.    Social Determinants of Health   Financial Resource Strain: Not on file  Food Insecurity: No Food Insecurity   Worried About Charity fundraiser in the Last Year: Never true   Ran Out of Food in the Last Year: Never true  Transportation Needs: No Transportation Needs   Lack of Transportation (Medical): No   Lack of Transportation (Non-Medical): No  Physical Activity: Not on file  Stress: Not on file  Social Connections: Not on file  Intimate Partner Violence: Not on file    Outpatient Medications Prior to Visit  Medication Sig Dispense Refill   insulin glargine (LANTUS) 100 UNIT/ML injection Inject 80 Units into the skin at bedtime.     potassium citrate (UROCIT-K) 10 MEQ (1080 MG) SR tablet Take 1 tablet (10 mEq total) by mouth 2 (two) times daily with a meal. 60 tablet 0   ibuprofen (ADVIL) 600 MG tablet Take 600 mg by mouth every 6 (six) hours as needed for mild pain or moderate pain. (Patient not taking: Reported on 03/15/2021)     Insulin Pen Needle (NOVOFINE PEN NEEDLE) 32G X 6 MM MISC USE AS DIRECTED. 200 each PRN   Insulin Pen Needle 32G X 4 MM MISC Use as directed with insulin 100 each 11   metoprolol succinate (TOPROL XL) 25 MG 24 hr tablet Take 1 tablet (25 mg total) by mouth once daily. (Patient not taking: Reported on 03/15/2021) 90 tablet 1   SUMAtriptan (IMITREX) 50 MG tablet Take 1 tablet (50 mg total) by mouth once daily for 1 dose for migraines. May repeat in 2 hours if headache persists or recurs. (Max of 2 tablets in 24 hours) 10 tablet 0   torsemide (DEMADEX) 20 MG tablet Take 1 tablet (20 mg total) by mouth once daily. (Patient not taking: Reported on 03/15/2021) 90 tablet 1   No facility-administered medications prior to visit.    Allergies  Allergen Reactions   Penicillins Hives   Topiramate     Other  reaction(s): Other (See Comments) Worsened migraines, N/V   Topiramate Er Nausea And Vomiting    Intensified Migraines    Valtrex [Valacyclovir] Itching    ROS Review of Systems  Constitutional: Negative.   Eyes: Negative.   Respiratory: Negative.    Cardiovascular: Negative.   Endocrine: Negative.   Skin:  Positive for rash (to left shoulder and left chest).  Neurological: Negative.      Objective:    Physical Exam HENT:     Head: Normocephalic and atraumatic.     Mouth/Throat:     Mouth: Mucous membranes are moist.  Eyes:     Extraocular Movements: Extraocular movements intact.     Conjunctiva/sclera: Conjunctivae normal.     Pupils: Pupils are equal, round, and reactive to  light.  Cardiovascular:     Rate and Rhythm: Normal rate and regular rhythm.     Pulses: Normal pulses.     Heart sounds: Normal heart sounds.  Pulmonary:     Effort: Pulmonary effort is normal.     Breath sounds: Normal breath sounds.  Skin:    General: Skin is warm.     Comments: No rash was observed.  Neurological:     General: No focal deficit present.     Mental Status: She is alert and oriented to person, place, and time. Mental status is at baseline.  Psychiatric:        Mood and Affect: Mood normal.        Behavior: Behavior normal.        Thought Content: Thought content normal.        Judgment: Judgment normal.    BP 104/69 (BP Location: Left Arm, Patient Position: Sitting, Cuff Size: Large)   Pulse 60   Temp 99.5 F (37.5 C)   Ht $R'5\' 8"'CE$  (1.727 m)   Wt 274 lb 12.8 oz (124.6 kg)   SpO2 97%   BMI 41.78 kg/m  Wt Readings from Last 3 Encounters:  03/15/21 274 lb 12.8 oz (124.6 kg)  03/10/21 272 lb (123.4 kg)  03/08/21 272 lb (123.4 kg)   Encouraged weight loss  Health Maintenance Due  Topic Date Due   COVID-19 Vaccine (1) Never done   Pneumococcal Vaccine 68-45 Years old (1 - PCV) Never done   Hepatitis C Screening  Never done   TETANUS/TDAP  Never done   PAP  SMEAR-Modifier  Never done   OPHTHALMOLOGY EXAM  07/04/2018   COLONOSCOPY (Pts 45-86yrs Insurance coverage will need to be confirmed)  Never done   INFLUENZA VACCINE  Never done    There are no preventive care reminders to display for this patient.  Lab Results  Component Value Date   TSH 1.990 01/27/2021   Lab Results  Component Value Date   WBC 7.5 02/15/2021   HGB 11.3 02/15/2021   HCT 37.2 02/15/2021   MCV 76 (L) 02/15/2021   PLT 390 02/15/2021   Lab Results  Component Value Date   NA 141 02/15/2021   K 4.3 02/15/2021   CO2 24 02/15/2021   GLUCOSE 131 (H) 02/15/2021   BUN 7 02/15/2021   CREATININE 0.82 02/15/2021   BILITOT 0.4 08/31/2020   ALKPHOS 134 (H) 08/31/2020   AST 12 08/31/2020   ALT 15 08/31/2020   PROT 7.9 08/31/2020   ALBUMIN 4.4 08/31/2020   CALCIUM 9.7 02/15/2021   ANIONGAP 8 08/09/2020   EGFR 89 02/15/2021   Lab Results  Component Value Date   CHOL 142 11/11/2019   Lab Results  Component Value Date   HDL 32 (L) 11/11/2019   Lab Results  Component Value Date   LDLCALC 94 11/11/2019   Lab Results  Component Value Date   TRIG 84 11/11/2019   Lab Results  Component Value Date   CHOLHDL 4.4 11/11/2019   Lab Results  Component Value Date   HGBA1C 11.4 (H) 02/15/2021      Assessment & Plan:   1. Type 2 diabetes mellitus with hyperglycemia, with long-term current use of insulin (HCC) -Her HgbA1c was 11.4%, her goal should be less than 7%. She was advised to check her blood glucose tid, record and bring to follow up appointment in 2 weeks. Will likely add meal time insulin. She was advised to continue on low carb/non  concentrated sweet diet and exercise as tolerated. - HgB A1c; Future - POCT Glucose (CBG); Future - POCT Glucose (CBG)  2. Rash - No rash was observed, she was advised to schedule appointment with the eruption of rashes.     Follow-up: Return in about 15 days (around 03/30/2021), or if symptoms worsen or fail to  improve.    Elmer Merwin Jerold Coombe, NP

## 2021-03-17 ENCOUNTER — Other Ambulatory Visit (INDEPENDENT_AMBULATORY_CARE_PROVIDER_SITE_OTHER): Payer: Self-pay

## 2021-03-17 ENCOUNTER — Other Ambulatory Visit: Payer: Self-pay

## 2021-03-17 DIAGNOSIS — I5189 Other ill-defined heart diseases: Secondary | ICD-10-CM

## 2021-03-17 DIAGNOSIS — I493 Ventricular premature depolarization: Secondary | ICD-10-CM

## 2021-03-18 LAB — BASIC METABOLIC PANEL
BUN/Creatinine Ratio: 18 (ref 9–23)
BUN: 18 mg/dL (ref 6–24)
CO2: 27 mmol/L (ref 20–29)
Calcium: 9.7 mg/dL (ref 8.7–10.2)
Chloride: 88 mmol/L — ABNORMAL LOW (ref 96–106)
Creatinine, Ser: 0.99 mg/dL (ref 0.57–1.00)
Glucose: 497 mg/dL — ABNORMAL HIGH (ref 70–99)
Potassium: 4 mmol/L (ref 3.5–5.2)
Sodium: 131 mmol/L — ABNORMAL LOW (ref 134–144)
eGFR: 71 mL/min/{1.73_m2} (ref >59)

## 2021-03-28 ENCOUNTER — Encounter: Payer: Self-pay | Admitting: Emergency Medicine

## 2021-03-30 ENCOUNTER — Ambulatory Visit: Payer: Self-pay | Admitting: Gerontology

## 2021-04-03 ENCOUNTER — Other Ambulatory Visit: Payer: Self-pay | Admitting: Gerontology

## 2021-04-03 ENCOUNTER — Other Ambulatory Visit: Payer: Self-pay

## 2021-04-03 DIAGNOSIS — E1165 Type 2 diabetes mellitus with hyperglycemia: Secondary | ICD-10-CM

## 2021-04-04 ENCOUNTER — Telehealth: Payer: Self-pay | Admitting: Pharmacist

## 2021-04-04 ENCOUNTER — Other Ambulatory Visit: Payer: Self-pay

## 2021-04-04 MED ORDER — LANTUS SOLOSTAR 100 UNIT/ML ~~LOC~~ SOPN
80.0000 [IU] | PEN_INJECTOR | Freq: Every day | SUBCUTANEOUS | 5 refills | Status: DC
  Filled 2021-04-04: qty 75, 93d supply, fill #0
  Filled 2021-04-04: qty 30, 38d supply, fill #0
  Filled 2021-04-26: qty 30, 38d supply, fill #1
  Filled 2021-05-29: qty 75, 94d supply, fill #1
  Filled 2021-09-08: qty 75, 94d supply, fill #2

## 2021-04-04 NOTE — Telephone Encounter (Signed)
04/04/2021 1:47:19 PM - Lantus Solostar dose ^ faxed to [facility]  -- Rhetta Mura - Tuesday, April 04, 2021 1:46 PM --Faxed [employer] refill - DOSE INCREASE for Lantus Solostar Inject 80 units daily at bedtime # 5 boxes.

## 2021-04-04 NOTE — Telephone Encounter (Signed)
04/04/2021 12:14:11 PM - Lantus Solostar dose ^ to dr  -- Rhetta Mura - Tuesday, April 04, 2021 12:12 PM --Received pharmacy printout for Lantus Solostar Inject 80 units into the skin daily at bedtime, printed refill request and put in [facility] folder for [facility] to sign, for DOSE INCREASE.

## 2021-04-13 ENCOUNTER — Other Ambulatory Visit: Payer: Self-pay

## 2021-04-13 ENCOUNTER — Encounter: Payer: Self-pay | Admitting: Gerontology

## 2021-04-13 ENCOUNTER — Ambulatory Visit: Payer: Self-pay | Admitting: Gerontology

## 2021-04-13 VITALS — BP 128/76 | HR 68 | Temp 98.1°F | Ht 68.0 in | Wt 276.7 lb

## 2021-04-13 DIAGNOSIS — E1165 Type 2 diabetes mellitus with hyperglycemia: Secondary | ICD-10-CM

## 2021-04-13 DIAGNOSIS — Z794 Long term (current) use of insulin: Secondary | ICD-10-CM

## 2021-04-13 LAB — GLUCOSE, POCT (MANUAL RESULT ENTRY): POC Glucose: 428 mg/dl — AB (ref 70–99)

## 2021-04-13 MED ORDER — BLOOD GLUCOSE MONITOR KIT
PACK | 0 refills | Status: DC
  Filled 2021-04-13: qty 1, fill #0

## 2021-04-13 MED ORDER — INSULIN ASPART 100 UNIT/ML FLEXPEN
5.0000 [IU] | PEN_INJECTOR | Freq: Three times a day (TID) | SUBCUTANEOUS | 11 refills | Status: DC
  Filled 2021-04-13: qty 15, fill #0
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
## 2021-04-13 NOTE — Patient Instructions (Signed)

## 2021-04-14 ENCOUNTER — Other Ambulatory Visit: Payer: Self-pay

## 2021-04-18 ENCOUNTER — Other Ambulatory Visit: Payer: Self-pay

## 2021-04-18 ENCOUNTER — Ambulatory Visit: Payer: Self-pay | Admitting: Gerontology

## 2021-04-18 ENCOUNTER — Encounter: Payer: Self-pay | Admitting: Gerontology

## 2021-04-18 VITALS — BP 146/70 | HR 78 | Temp 97.9°F | Resp 18 | Ht 67.0 in | Wt 270.5 lb

## 2021-04-18 DIAGNOSIS — E1165 Type 2 diabetes mellitus with hyperglycemia: Secondary | ICD-10-CM

## 2021-04-18 LAB — GLUCOSE, POCT (MANUAL RESULT ENTRY): POC Glucose: 389 mg/dl — AB (ref 70–99)

## 2021-04-18 MED ORDER — INSULIN ASPART 100 UNIT/ML FLEXPEN
8.0000 [IU] | PEN_INJECTOR | Freq: Three times a day (TID) | SUBCUTANEOUS | 11 refills | Status: DC
  Filled 2021-04-18: qty 5, fill #0

## 2021-04-18 NOTE — Progress Notes (Signed)
Established Patient Office Visit  Subjective:  Patient ID: [name], female    DOB: 1975/12/08  Age: 45 y.o. MRN: [id number]  CC:  Chief Complaint  Patient presents with   Follow-up   Diabetes    HPI [name]  is a 45 year old female who has history of depression, type 2 diabetes with peripheral neuropathy, GERD, headache, kidney stones, hypertension presents for follow up for Type 2 diabetes mellitus. Her HgbA1c done on 02/15/21 was 11.4%. She was not consistent with checking her blood glucose. Per her log, she checked her fasting readings and it's between 336 mg/dl to 561 mg/dl. The few readings checked during lunch was between 226 mg/dl to 502 mg/dl. Her blood glucose was 389 mg/dl when checked during visit. She was started on 5 units of Novolog Aspart tid, she states that she has missed some doses . She reports that she is feeling fine and has no symptoms. Finally, she states that she is aware of the complications of uncontrolled diabetes and can't promise that she will adhere to her meal time insulin, but will do the best she can. Overall, she states that she's doing well and offers no further complaint.  Past Medical History:  Diagnosis Date   Depression    Diabetes mellitus without complication (HCC)    GERD (gastroesophageal reflux disease)    Headache    History of kidney stones    History of nephrolithiasis 2015   via CT imaging, including an obstructing stone   Hyperlipidemia    Hypertension 12/2019   per patient, she has never been treated for high blood pressure   Low back pain 12/2019   Neuromuscular disorder ([place])    neuropathy    Past Surgical History:  Procedure Laterality Date   CHOLECYSTECTOMY  1998   CYSTOSCOPY WITH STENT PLACEMENT Right 12/09/2019   Procedure: CYSTOSCOPY WITH STENT PLACEMENT;  Surgeon: Abbie Sons, MD;  Location: [facility] ORS;  Service: Urology;  Laterality: Right;   CYSTOSCOPY/RETROGRADE/URETEROSCOPY  12/29/2019    Procedure: CYSTOSCOPY/RETROGRADE/URETEROSCOPY;  Surgeon: Abbie Sons, MD;  Location: [facility] OR[facility];  Service: Urology;;    Family History  Problem Relation Age of Onset   Hypertension Mother    Heart disease Mother    Kidney failure Father    Sleep apnea Sister    Pancreatic cancer Maternal Grandmother    Diabetes Maternal Great-grandmother     Social History   Socioeconomic History   Marital status: Single    Spouse name: Not on file   Number of children: Not on file   Years of education: Not on file   Highest education level: Not on file  Occupational History   Occupation: not currently working  Tobacco Use   Smoking status: Never   Smokeless tobacco: Never  Vaping Use   Vaping Use: Never used  Substance and Sexual Activity   Alcohol use: No   Drug use: No   Sexual activity: Never  Other Topics Concern   Not on file  Social History Narrative   - Needs a blood sugar monitor       Patient said she is living with her mom and things are going ok. Mom provides for her food, transport, etc.       Patient wants to wait until next visit before any information is shared.    Social Determinants of Health   Financial Resource Strain: Not on file  Food Insecurity: No Food Insecurity   Worried About Running Out of  Food in the Last Year: Never true   [place] in the Last Year: Never true  Transportation Needs: No Transportation Needs   Lack of Transportation (Medical): No   Lack of Transportation (Non-Medical): No  Physical Activity: Not on file  Stress: Not on file  Social Connections: Not on file  Intimate Partner Violence: Not on file    Outpatient Medications Prior to Visit  Medication Sig Dispense Refill   insulin glargine (LANTUS SOLOSTAR) 100 UNIT/ML Solostar Pen Inject 80 Units into the skin once daily at bedtime. 75 mL 5   Insulin Pen Needle (NOVOFINE PEN NEEDLE) 32G X 6 MM MISC USE AS DIRECTED. 200 each PRN   Insulin Pen Needle 32G X 4 MM MISC Use as  directed with insulin 100 each 11   metoprolol succinate (TOPROL XL) 25 MG 24 hr tablet Take 1 tablet (25 mg total) by mouth once daily. 90 tablet 1   potassium citrate (UROCIT-K) 10 MEQ (1080 MG) SR tablet Take 1 tablet (10 mEq total) by mouth 2 (two) times daily with a meal. 60 tablet 0   SUMAtriptan (IMITREX) 50 MG tablet Take 1 tablet (50 mg total) by mouth once daily for 1 dose for migraines. May repeat in 2 hours if headache persists or recurs. (Max of 2 tablets in 24 hours) (Patient taking differently: Take 50 mg by mouth once. Once in past 2 months) 10 tablet 0   torsemide (DEMADEX) 20 MG tablet Take 1 tablet (20 mg total) by mouth once daily. 90 tablet 1   insulin aspart (NOVOLOG) 100 UNIT/ML FlexPen Inject 5 Units into the skin 3 (three) times daily with meals. 15 mL 11   ibuprofen (ADVIL) 600 MG tablet Take 600 mg by mouth every 6 (six) hours as needed for mild pain or moderate pain. (Patient not taking: Reported on 03/15/2021)     No facility-administered medications prior to visit.    Allergies  Allergen Reactions   Penicillins Hives   Topiramate     Other reaction(s): Other (See Comments) Worsened migraines, N/V   Topiramate Er Nausea And Vomiting    Intensified Migraines    Valtrex [Valacyclovir] Itching    Diarrhea, abdominal pain, and possibly itching    ROS Review of Systems  Constitutional: Negative.   Eyes: Negative.   Respiratory: Negative.    Cardiovascular: Negative.   Endocrine: Negative.   Skin: Negative.   Neurological: Negative.      Objective:    Physical Exam HENT:     Head: Normocephalic and atraumatic.     Mouth/Throat:     Mouth: Mucous membranes are moist.  Eyes:     Extraocular Movements: Extraocular movements intact.     Conjunctiva/sclera: Conjunctivae normal.     Pupils: Pupils are equal, round, and reactive to light.  Cardiovascular:     Rate and Rhythm: Normal rate and regular rhythm.     Pulses: Normal pulses.     Heart sounds:  Normal heart sounds.  Pulmonary:     Effort: Pulmonary effort is normal.     Breath sounds: Normal breath sounds.  Skin:    General: Skin is warm.  Neurological:     General: No focal deficit present.     Mental Status: She is alert and oriented to person, place, and time. Mental status is at baseline.  Psychiatric:        Mood and Affect: Mood normal.        Behavior: Behavior normal.  Thought Content: Thought content normal.        Judgment: Judgment normal.    BP (!) 146/70 (BP Location: Right Arm, Patient Position: Sitting, Cuff Size: Large)   Pulse 78   Temp 97.9 F (36.6 C) (Oral)   Resp 18   Ht _0  (1.702 m)   Wt 270 lb 8 oz (122.7 kg)   LMP 02/16/2021 (Approximate)   SpO2 96%   BMI 42.37 kg/m  Wt Readings from Last 3 Encounters:  04/18/21 270 lb 8 oz (122.7 kg)  04/13/21 276 lb 11.2 oz (125.5 kg)  03/15/21 274 lb 12.8 oz (124.6 kg)   Encouraged weight loss  Health Maintenance Due  Topic Date Due   COVID-19 Vaccine (1) Never done   Pneumococcal Vaccine 42-84 Years old (1 - PCV) Never done   Hepatitis C Screening  Never done   TETANUS/TDAP  Never done   PAP SMEAR-Modifier  Never done   OPHTHALMOLOGY EXAM  07/04/2018   COLONOSCOPY (Pts 45-60yr Insurance coverage will need to be confirmed)  Never done   INFLUENZA VACCINE  Never done    There are no preventive care reminders to display for this patient.  Lab Results  Component Value Date   TSH 1.990 01/27/2021   Lab Results  Component Value Date   WBC 7.5 02/15/2021   HGB 11.3 02/15/2021   HCT 37.2 02/15/2021   MCV 76 (L) 02/15/2021   PLT 390 02/15/2021   Lab Results  Component Value Date   NA 131 (L) 03/17/2021   K 4.0 03/17/2021   CO2 27 03/17/2021   GLUCOSE 497 (H) 03/17/2021   BUN 18 03/17/2021   CREATININE 0.99 03/17/2021   BILITOT 0.4 08/31/2020   ALKPHOS 134 (H) 08/31/2020   AST 12 08/31/2020   ALT 15 08/31/2020   PROT 7.9 08/31/2020   ALBUMIN 4.4 08/31/2020   CALCIUM 9.7  03/17/2021   ANIONGAP 8 08/09/2020   EGFR 71 03/17/2021   Lab Results  Component Value Date   CHOL 142 11/11/2019   Lab Results  Component Value Date   HDL 32 (L) 11/11/2019   Lab Results  Component Value Date   LDLCALC 94 11/11/2019   Lab Results  Component Value Date   TRIG 84 11/11/2019   Lab Results  Component Value Date   CHOLHDL 4.4 11/11/2019   Lab Results  Component Value Date   HGBA1C 11.4 (H) 02/15/2021      Assessment & Plan:   1. Type 2 diabetes mellitus with hyperglycemia, with long-term current use of insulin (HCC) - Her HgbA1c was 11.4%, her diabetes is not controlled due to non compliance. Reiterated the complications of type 2 diabetes, she verbalized understanding. She was advised to continue on low carb/non concentrated sweet diet and exercise as tolerated. Her Aspart was increased to 8 units tid, [facility] She was advised to notify clinic or go to the ED for worsening symptoms. - POCT Glucose (CBG); Future - insulin aspart (NOVOLOG) 100 UNIT/ML FlexPen; Inject 8 Units into the skin 3 (three) times daily with meals.  Dispense: 5 mL; Refill: 11 - POCT Glucose (CBG)     Follow-up: Return in about 7 weeks (around 06/06/2021), or if symptoms worsen or fail to improve.    Gara Kincade EJerold Coombe NP

## 2021-04-18 NOTE — Patient Instructions (Signed)

## 2021-04-20 ENCOUNTER — Other Ambulatory Visit: Payer: Self-pay | Admitting: Urology

## 2021-04-20 ENCOUNTER — Other Ambulatory Visit: Payer: Self-pay

## 2021-04-21 ENCOUNTER — Other Ambulatory Visit: Payer: Self-pay

## 2021-04-21 MED ORDER — POTASSIUM CITRATE ER 10 MEQ (1080 MG) PO TBCR
EXTENDED_RELEASE_TABLET | ORAL | 0 refills | Status: DC
  Filled 2021-04-21: qty 60, 30d supply, fill #0

## 2021-04-24 ENCOUNTER — Other Ambulatory Visit: Payer: Self-pay

## 2021-04-25 ENCOUNTER — Other Ambulatory Visit: Payer: Self-pay

## 2021-04-26 ENCOUNTER — Other Ambulatory Visit: Payer: Self-pay

## 2021-04-27 ENCOUNTER — Other Ambulatory Visit: Payer: Self-pay

## 2021-05-03 ENCOUNTER — Other Ambulatory Visit: Payer: Self-pay

## 2021-05-12 ENCOUNTER — Other Ambulatory Visit: Payer: Self-pay

## 2021-05-17 ENCOUNTER — Other Ambulatory Visit: Payer: Self-pay

## 2021-05-17 DIAGNOSIS — Z794 Long term (current) use of insulin: Secondary | ICD-10-CM

## 2021-05-17 DIAGNOSIS — E1165 Type 2 diabetes mellitus with hyperglycemia: Secondary | ICD-10-CM

## 2021-05-18 ENCOUNTER — Other Ambulatory Visit: Payer: Self-pay

## 2021-05-18 LAB — HEMOGLOBIN A1C
Est. average glucose Bld gHb Est-mCnc: 295 mg/dL
Hgb A1c MFr Bld: 11.9 % — ABNORMAL HIGH (ref 4.8–5.6)

## 2021-05-29 ENCOUNTER — Other Ambulatory Visit: Payer: Self-pay

## 2021-05-30 ENCOUNTER — Other Ambulatory Visit: Payer: Self-pay

## 2021-05-30 ENCOUNTER — Ambulatory Visit: Payer: Self-pay | Admitting: "Endocrinology

## 2021-05-30 DIAGNOSIS — E1165 Type 2 diabetes mellitus with hyperglycemia: Secondary | ICD-10-CM

## 2021-05-30 DIAGNOSIS — Z794 Long term (current) use of insulin: Secondary | ICD-10-CM

## 2021-05-30 MED ORDER — OZEMPIC (0.25 OR 0.5 MG/DOSE) 2 MG/1.5ML ~~LOC~~ SOPN
PEN_INJECTOR | SUBCUTANEOUS | 1 refills | Status: DC
  Filled 2021-05-30: qty 6, 118d supply, fill #0
  Filled 2021-05-31: qty 1.5, 42d supply, fill #0
  Filled 2021-07-04: qty 1.5, 28d supply, fill #1

## 2021-05-30 MED ORDER — INSULIN ASPART 100 UNIT/ML FLEXPEN
PEN_INJECTOR | SUBCUTANEOUS | 11 refills | Status: DC
  Filled 2021-05-30: qty 5, fill #0
  Filled 2021-07-04: qty 15, 32d supply, fill #0
  Filled 2021-08-22: qty 45, 97d supply, fill #1

## 2021-05-30 NOTE — Patient Instructions (Addendum)
For ozempic, start 0.25mg  weekly.  After 4 weeks on this dose, start taking 0.5mg  weekly.  Stop if you have vomiting or severe nausea/stomach pin.    For breakfast (or first meal of the day): Novolog Sliding Scale 70-150... 10 units 151-175... 12 units 176-200...14u 201-225...16u 251-275...18u 276-300...20u 301-325...22u 326-350.Marland KitchenMarland Kitchen24u 350+... 26 units.  For Lunch:   Novolog 10 units  For dinner:  Novolog 10 units  Before bed: Lantus 80 units

## 2021-05-31 ENCOUNTER — Other Ambulatory Visit: Payer: Self-pay

## 2021-05-31 NOTE — Progress Notes (Signed)
Follow up Diabetes/ Endocrine [facility]     Patient ID: [name], female   DOB: May 09, 1976, 46 y.o.   MRN: [id number] Assessment:  [name] is a 46 y.o. female who is seen in follow up for No primary diagnosis found. at the request of [name], [name], NP.  Encounter Diagnoses 1. Type 2 diabetes mellitus with hyperglycemia, with long-term current use of insulin [facility])     Assessment  Patient is a 46 y.o. female who is seen for routine T2DM follow-up. She is currently not at goal and is not improving. Her HbA1c was 11.9 on 05/17/2021, up from 11.4 on 02/15/2021 and 10.4 on 12/07/2020.    Plan:     T2DM - Continue Lantus (insulin glargine) 80 units in the evening. - Adopt a sliding scale for Novolog (insulin aspart) for the first meal of the day, and continue 8 units for the other 2 meals of the day. 70-150... 10 units 151-175... 12 units 176-200...14u 201-225...16u 251-275...18u 276-300... 20u 301-325... 22u 326-350... 24u 350+... 26 units.  This change was motivated by the patient's social circumstances. Patient voiced understanding of the sliding scale. - Start Ozempic 0.25 mg injection weekly for 4 weeks. After 4 weeks, increase weekly dose to 0.5 mg.  Patient was educated on the risks of Ozempic/semaglutide and instructed to stop the medication and seek medical care if experiencing nausea/vomiting.   Chest pain: chronic, tender to palpation.  High suspicion for MSK cause. - Apply warm compress or icy/hot medicine when experiencing chest pain.    Patient Instructions  For ozempic, start 0.25mg  weekly.  After 4 weeks on this dose, start taking 0.5mg  weekly.  Stop if you have vomiting or severe nausea/stomach pin.    For breakfast (or first meal of the day): Novolog Sliding Scale 70-150... 10 units 151-175... 12 units 176-200...14u 201-225...16u 251-275...18u 276-300...20u 301-325...22u 326-350.Marland KitchenMarland Kitchen24u 350+... 26 units.  For Lunch:    Novolog 10 units  For dinner:  Novolog 10 units  Before bed: Lantus 80 units   No orders of the defined types were placed in this encounter.    Subjective:  Patient is a 46 year old female with a history of T2DM, HTN, hyperlipidemia, nephrolithiasis, depression, and chronic low back pain, who presents for routine DM follow-up. She states that she has not been managing her DM well due to social circumstances revolving around housing issues. She reports measuring her BG 3-4x/week and reports fasting levels around 430+ for the past few weeks. She states that she is "worried" because her BG has never been "that high for so long."   She manages her DM with insulin glargine (Lantus) 80 units once daily (which she takes regularly) and insulin aspart (Novolog) 8 units prescribed TID but taken only 1-2x daily. She attributes the lack of adherence to her aspart regimen to her social conditions (described below). She states that she tolerated daily Victoza injection which was discontinued by her PCP because of the lack of significant or appreciable improvement. Victoza was replaced by weekly Trulicity injections; she reports taking her first injection on Sunday and experiencing her eyesight worsening on Tuesday/Wednesday ("out of focus"). She states that this episode was different from previous ones she had experienced (blurry vision, "black dots," sudden and temporary loss of vision in the lower visual fields in both eyes a few years ago). She states that she does not want to "take any risks with [her] vision" and refuses to try Trulicity again.   Patient endorses polydipsia, polyuria,  and numbness in her hands, but denies swelling in lower extremities. She reports recent trauma in her left ring finger ("knocked it") which causes extreme pain occasionally. She denies symptoms of hypoglycemia and foot health concerns.   Patient reports being prescribed eyeglasses but states that she experienced "double  vision" and has yet to get them replaced. She states that her last foot exam (monofilament) was less than 1 year ago.  Patient also reports pain in her lower back as well as chest pain for the past few weeks. She describes her chest pain as random, sharp, and burning. She states that these episodes have increased in frequency (3-4x/week from "ever so often") and duration (10-15 min now). She states that her EKG was not concerning and she has upcoming appointments scheduled with her PCP and cardiologist.  Patient reports that her diet had improved as she was eating home-cooked meals when she received a new stove since her last [facility] visit. But she states that her diet lately consists mostly of fast food because she is "in between houses." She reports water damage and electric issues in her current house and states that she is currently looking for a new home as her stuff is in storage. She restated living in an unsafe neighborhood. She also reports having to eat some of her meals at her sisters', which impedes her ability to consistently take her prandial insulin. Last, she also reports having to frequently take her niece to the hospital for pain crises due to sickle cell disease. She expressed some frustration about her current situation and states that "I hate feeling like beggars" and that she is not sure what to say to her mother who taught her "You don't accept charity."    Review of Systems  Endocrine: Positive for polydipsia and polyuria.   [name]  has a past medical history of Depression, Diabetes mellitus without complication (HCC), GERD (gastroesophageal reflux disease), Headache, History of kidney stones, History of nephrolithiasis (2015), Hyperlipidemia, Hypertension (12/2019), Low back pain (12/2019), and Neuromuscular disorder (HCC).  Family History, Social History, current Medications and allergies reviewed and updated in Epic.  Objective:    Blood pressure 134/67, pulse 79,  temperature 98.7 F (37.1 C), height 5\' 8"  (1.727 m), weight 273 lb 8 oz (124.1 kg), SpO2 95 %. Physical Exam Constitutional:      Appearance: Normal appearance.  Cardiovascular:     Heart sounds: Normal heart sounds.  Pulmonary:     Effort: Pulmonary effort is normal.     Breath sounds: Normal breath sounds.  Neurological:     Mental Status: She is alert.  Abdominal: No erythema or tenderness notes in injection areas Feet: No signs of fungal infection, bruising, or ulcers      Data : I have personally reviewed pertinent labs and imaging studies, if indicated,  with the patient in clinic today.   Lab Orders  No laboratory test(s) ordered today    HC Readings from Last 3 Encounters:  No data found for [facility]    Wt Readings from Last 3 Encounters:  05/30/21 273 lb 8 oz (124.1 kg)  05/17/21 273 lb 6.4 oz (124 kg)  04/18/21 270 lb 8 oz (122.7 kg)

## 2021-06-01 ENCOUNTER — Other Ambulatory Visit: Payer: Self-pay | Admitting: Emergency Medicine

## 2021-06-01 DIAGNOSIS — E1165 Type 2 diabetes mellitus with hyperglycemia: Secondary | ICD-10-CM

## 2021-06-02 ENCOUNTER — Telehealth: Payer: Self-pay | Admitting: Pharmacist

## 2021-06-02 NOTE — Telephone Encounter (Signed)
06/02/2021 12:06:42 PM - Ozempic (New medication) -- [name] - Friday, June 02, 2021 11:58 AM --  Received printout from [place] 0.25mg  / 0.5 Inject 0.25mg  into the skin once a week for 28 days. Then Inject 0.5mg  into the skin once a week. Printed form for [place] to sign. Mailed patient her portion to sign. Along with request of POI and Recertification application.

## 2021-06-06 ENCOUNTER — Other Ambulatory Visit: Payer: Self-pay

## 2021-06-06 ENCOUNTER — Encounter: Payer: Self-pay | Admitting: Gerontology

## 2021-06-06 ENCOUNTER — Ambulatory Visit: Payer: Self-pay | Admitting: Gerontology

## 2021-06-06 VITALS — BP 134/70 | HR 59 | Temp 98.3°F | Resp 16 | Ht 68.0 in | Wt 280.3 lb

## 2021-06-06 DIAGNOSIS — Z794 Long term (current) use of insulin: Secondary | ICD-10-CM

## 2021-06-06 LAB — GLUCOSE, POCT (MANUAL RESULT ENTRY): POC Glucose: 185 mg/dl — AB (ref 70–99)

## 2021-06-06 NOTE — Patient Instructions (Signed)

## 2021-06-06 NOTE — Progress Notes (Signed)
Established Patient Office Visit  Subjective:  Patient ID: [name], female    DOB: November 06, 1975  Age: 46 y.o. MRN: [id number]  CC:  Chief Complaint  Patient presents with   Follow-up   Diabetes    A1C drawn 05/17/21    HPI [name] is a 46 year old female who has history of depression, type 2 diabetes with peripheral neuropathy, GERD, headache, kidney stones, hypertension presents for follow up for Type 2 diabetes mellitus.  Her HgbA1c  done on 05/17/21 increased from 11.4% to 11.9%. She checks her blood glucose at least bid. She states that she's unable to cook due to her living situation. She states that her fasting blood glucose reading was 281 mg/dl and it was 185 mg/dl when checked during visit. She was [facility] by the [facility] End[facility] on 05/30/21. It was recommended she continue on 80 units Lantus at bedtime, sliding scale with Insulin Aspart in the morning, and 10 units with Lunch and Dinner. She started 0.25 mg of Ozempic last week, states that she's tolerating medication. She denies hypo/hyperglycemic symptoms and peripheral neuropathy. Overall, she states that she's doing well and offers no further complaint.  Past Medical History:  Diagnosis Date   Depression    Diabetes mellitus without complication (HCC)    GERD (gastroesophageal reflux disease)    Headache    History of kidney stones    History of nephrolithiasis 2015   via CT imaging, including an obstructing stone   Hyperlipidemia    Hypertension 12/2019   per patient, she has never been treated for high blood pressure   Low back pain 12/2019   Neuromuscular disorder ([place])    neuropathy    Past Surgical History:  Procedure Laterality Date   CHOLECYSTECTOMY  1998   CYSTOSCOPY WITH STENT PLACEMENT Right 12/09/2019   Procedure: CYSTOSCOPY WITH STENT PLACEMENT;  Surgeon: Abbie Sons, MD;  Location: [facility] ORS;  Service: Urology;  Laterality: Right;   CYSTOSCOPY/RETROGRADE/URETEROSCOPY   12/29/2019   Procedure: CYSTOSCOPY/RETROGRADE/URETEROSCOPY;  Surgeon: Abbie Sons, MD;  Location: [facility] ORS;  Service: Urology;;    Family History  Problem Relation Age of Onset   Hypertension Mother    Heart disease Mother    Kidney failure Father    Sleep apnea Sister    Pancreatic cancer Maternal Grandmother    Diabetes Maternal Great-grandmother     Social History   Socioeconomic History   Marital status: Single    Spouse name: Not on file   Number of children: Not on file   Years of education: Not on file   Highest education level: Not on file  Occupational History   Occupation: not currently working  Tobacco Use   Smoking status: Never   Smokeless tobacco: Never  Vaping Use   Vaping Use: Never used  Substance and Sexual Activity   Alcohol use: No   Drug use: No   Sexual activity: Never  Other Topics Concern   Not on file  Social History Narrative   - Needs a blood sugar monitor       Patient said she is living with her mom and things are going ok. Mom provides for her food, transport, etc.       Patient wants to wait until next visit before any information is shared.    Social Determinants of Radio broadcast assistant Strain: Not on file  Food Insecurity: No Food Insecurity   Worried About [place] in the Last  Year: Never true   Ran Out of Food in the Last Year: Never true  Transportation Needs: No Transportation Needs   Lack of Transportation (Medical): No   Lack of Transportation (Non-Medical): No  Physical Activity: Not on file  Stress: Not on file  Social Connections: Not on file  Intimate Partner Violence: Not on file    Outpatient Medications Prior to Visit  Medication Sig Dispense Refill   insulin aspart (NOVOLOG) 100 UNIT/ML FlexPen Inject up to 26 units with breakfast according to sliding scale, then 10 units with lunch, and 10 units with dinner. 5 mL 11   insulin glargine (LANTUS SOLOSTAR) 100 UNIT/ML Solostar Pen Inject 80  Units into the skin once daily at bedtime. 75 mL 5   Insulin Pen Needle (NOVOFINE PEN NEEDLE) 32G X 6 MM MISC USE AS DIRECTED. 200 each PRN   Insulin Pen Needle 32G X 4 MM MISC Use as directed with insulin 100 each 11   metoprolol succinate (TOPROL XL) 25 MG 24 hr tablet Take 1 tablet (25 mg total) by mouth once daily. 90 tablet 1   potassium citrate (UROCIT-K) 10 MEQ (1080 MG) SR tablet Take 1 tablet (10 mEq total) by mouth 2 (two) times daily with a meal. 60 tablet 0   Semaglutide,0.25 or 0.5MG /DOS, (OZEMPIC, 0.25 OR 0.5 MG/DOSE,) 2 MG/1.5ML SOPN Inject 0.25 mg into the skin once a week for 28 days. Then inject 0.5 mg into the skin once a week thereafter. 6 mL 1   SUMAtriptan (IMITREX) 50 MG tablet Take 1 tablet (50 mg total) by mouth once daily for 1 dose for migraines. May repeat in 2 hours if headache persists or recurs. (Max of 2 tablets in 24 hours) (Patient taking differently: Take 50 mg by mouth once. Last needed in December 2022) 10 tablet 0   torsemide (DEMADEX) 20 MG tablet Take 1 tablet (20 mg total) by mouth once daily. 90 tablet 1   No facility-administered medications prior to visit.    Allergies  Allergen Reactions   Penicillins Hives    Raised welts   Topiramate     Other reaction(s): Other (See Comments) Worsened migraines, N/V   Topiramate Er Nausea And Vomiting    Intensified Migraines    Valtrex [Valacyclovir] Itching    Diarrhea, abdominal pain, and possibly itching  Does not remember timing    ROS Review of Systems  Constitutional: Negative.   Eyes: Negative.   Respiratory: Negative.    Cardiovascular: Negative.   Endocrine: Negative.   Skin: Negative.   Neurological: Negative.      Objective:    Physical Exam HENT:     Head: Normocephalic and atraumatic.     Mouth/Throat:     Mouth: Mucous membranes are moist.  Eyes:     Extraocular Movements: Extraocular movements intact.     Conjunctiva/sclera: Conjunctivae normal.     Pupils: Pupils are  equal, round, and reactive to light.  Cardiovascular:     Rate and Rhythm: Normal rate and regular rhythm.     Pulses: Normal pulses.     Heart sounds: Normal heart sounds.  Pulmonary:     Effort: Pulmonary effort is normal.     Breath sounds: Normal breath sounds.  Skin:    General: Skin is warm.  Neurological:     General: No focal deficit present.     Mental Status: She is alert and oriented to person, place, and time. Mental status is at baseline.  Psychiatric:  Mood and Affect: Mood normal.        Behavior: Behavior normal.        Thought Content: Thought content normal.        Judgment: Judgment normal.    BP 134/70 (BP Location: Right Arm, Patient Position: Sitting, Cuff Size: Large)    Pulse (!) 59    Temp 98.3 F (36.8 C) (Oral)    Resp 16    Ht $R'5\' 8"'Dx$  (1.727 m)    Wt 280 lb 4.8 oz (127.1 kg)    LMP 03/06/2021 (Approximate)    SpO2 95%    BMI 42.62 kg/m  Wt Readings from Last 3 Encounters:  06/06/21 280 lb 4.8 oz (127.1 kg)  05/30/21 273 lb 8 oz (124.1 kg)  05/17/21 273 lb 6.4 oz (124 kg)   Encouraged weight loss  Health Maintenance Due  Topic Date Due   COVID-19 Vaccine (1) Never done   Hepatitis C Screening  Never done   TETANUS/TDAP  Never done   PAP SMEAR-Modifier  Never done   OPHTHALMOLOGY EXAM  07/04/2018   COLONOSCOPY (Pts 45-6yrs Insurance coverage will need to be confirmed)  Never done   INFLUENZA VACCINE  Never done   URINE MICROALBUMIN  06/29/2021    There are no preventive care reminders to display for this patient.  Lab Results  Component Value Date   TSH 1.990 01/27/2021   Lab Results  Component Value Date   WBC 7.5 02/15/2021   HGB 11.3 02/15/2021   HCT 37.2 02/15/2021   MCV 76 (L) 02/15/2021   PLT 390 02/15/2021   Lab Results  Component Value Date   NA 131 (L) 03/17/2021   K 4.0 03/17/2021   CO2 27 03/17/2021   GLUCOSE 497 (H) 03/17/2021   BUN 18 03/17/2021   CREATININE 0.99 03/17/2021   BILITOT 0.4 08/31/2020   ALKPHOS  134 (H) 08/31/2020   AST 12 08/31/2020   ALT 15 08/31/2020   PROT 7.9 08/31/2020   ALBUMIN 4.4 08/31/2020   CALCIUM 9.7 03/17/2021   ANIONGAP 8 08/09/2020   EGFR 71 03/17/2021   Lab Results  Component Value Date   CHOL 142 11/11/2019   Lab Results  Component Value Date   HDL 32 (L) 11/11/2019   Lab Results  Component Value Date   LDLCALC 94 11/11/2019   Lab Results  Component Value Date   TRIG 84 11/11/2019   Lab Results  Component Value Date   CHOLHDL 4.4 11/11/2019   Lab Results  Component Value Date   HGBA1C 11.9 (H) 05/17/2021      Assessment & Plan:    1. Type 2 diabetes mellitus with hyperglycemia, with long-term current use of insulin (HCC) -Her HgbA1c was 11.9%, her diabetes is not under control, her goal should be less than 7%. She was encouraged to continue on current medication, low carb/non concentrated sweet diet and exercise as tolerated. She will follow up with [facility] End[facility] team in 3 months. - Urine Microalbumin w/creat. ratio; Future - POCT Glucose (CBG); Future - POCT Glucose (CBG)     Follow-up: Return in about 3 months (around 08/31/2021), or if symptoms worsen or fail to improve.    Lissette Schenk Jerold Coombe, NP

## 2021-06-08 ENCOUNTER — Other Ambulatory Visit: Payer: Self-pay

## 2021-06-08 ENCOUNTER — Other Ambulatory Visit: Payer: Self-pay | Admitting: Gerontology

## 2021-06-08 ENCOUNTER — Telehealth: Payer: Self-pay | Admitting: Pharmacist

## 2021-06-08 NOTE — Telephone Encounter (Signed)
/  26/2023 11:42:17 AM - Novolog Flexpen & tips Forms to Dr & Patient -- Tarry Kos - Thursday, June 08, 2021 11:40 AM --  Darylene Price & Tips to Patient & Doctor for signature.

## 2021-06-12 ENCOUNTER — Other Ambulatory Visit: Payer: Self-pay | Admitting: Gerontology

## 2021-06-12 ENCOUNTER — Other Ambulatory Visit: Payer: Self-pay

## 2021-06-13 ENCOUNTER — Other Ambulatory Visit: Payer: Self-pay

## 2021-06-13 ENCOUNTER — Ambulatory Visit (INDEPENDENT_AMBULATORY_CARE_PROVIDER_SITE_OTHER): Payer: Self-pay | Admitting: Cardiology

## 2021-06-13 ENCOUNTER — Other Ambulatory Visit: Payer: Self-pay | Admitting: Urology

## 2021-06-13 ENCOUNTER — Encounter: Payer: Self-pay | Admitting: Cardiology

## 2021-06-13 VITALS — BP 120/70 | HR 64 | Ht 68.0 in | Wt 280.2 lb

## 2021-06-13 DIAGNOSIS — I493 Ventricular premature depolarization: Secondary | ICD-10-CM

## 2021-06-13 DIAGNOSIS — I5189 Other ill-defined heart diseases: Secondary | ICD-10-CM

## 2021-06-13 DIAGNOSIS — I10 Essential (primary) hypertension: Secondary | ICD-10-CM

## 2021-06-13 NOTE — Patient Instructions (Signed)
Medication Instructions:  °Your physician recommends that you continue on your current medications as directed. Please refer to the Current Medication list given to you today. ° °*If you need a refill on your cardiac medications before your next appointment, please call your pharmacy* ° ° °Lab Work: °None ordered °If you have labs (blood work) drawn today and your tests are completely normal, you will receive your results only by: °MyChart Message (if you have MyChart) OR °A paper copy in the mail °If you have any lab test that is abnormal or we need to change your treatment, we will call you to review the results. ° ° °Testing/Procedures: °None ordered ° ° °Follow-Up: °At [facility], you and your health needs are our priority.  As part of our continuing mission to provide you with exceptional heart care, we have created designated Provider Care Teams.  These Care Teams include your primary Cardiologist (physician) and Advanced Practice Providers (APPs -  Physician Assistants and Nurse Practitioners) who all work together to provide you with the care you need, when you need it. ° °We recommend signing up for the patient portal called "MyChart".  Sign up information is provided on this After Visit Summary.  MyChart is used to connect with patients for Virtual Visits (Telemedicine).  Patients are able to view lab/test results, encounter notes, upcoming appointments, etc.  Non-urgent messages can be sent to your provider as well.   °To learn more about what you can do with MyChart, go to [email or web address].   ° °Your next appointment:   °Your physician wants you to follow-up in: 6 months You will receive a reminder letter in the mail two months in advance. If you don't receive a letter, please call our office to schedule the follow-up appointment.  ° °The format for your next appointment:   °In Person ° °Provider:   °You may see Brian Agbor-Etang, MD or one of the following Advanced Practice Providers on your  designated Care Team:   °Christopher Berge, NP °Ryan Dunn, PA-C °Cadence Furth, PA-C{ ° ° ° ° °Other Instructions °N/A ° °

## 2021-06-13 NOTE — Progress Notes (Signed)
Cardiology Office Note:    Date:  06/13/2021   ID:  [name], DOB Aug 16, 1975, MRN [id number]  PCP:  Langston Reusing, NP   [facility] Providers Cardiologist:  Kate Sable, MD     Referring MD: Langston Reusing, NP   Chief Complaint  Patient presents with   Other    3 Month f/u c/o more frequent bilateral chest spasm pain. Meds reviewed verbally with pt.    History of Present Illness:    [name] is a 46 y.o. female with a hx of hypertension, diabetes, obesity, diastolic dysfunction who presents for follow up.    Seen in the past due to chest discomfort deemed noncardiac/musculoskeletal.  Echocardiogram obtained 11/2020 showed preserved ejection fraction.  Started on Toprol-XL due to frequent PVCs.  Was started on torsemide due to leg edema.  States edema is improved, but she is very stressful due to current living situation.  Her current home is broken leading to water getting inside.  Not able to afford good food or healthy diet options.  She is trying to get a job, is worried about losing her job when she gets it.   Prior notes Echo 11/2020 EF 60 to [id number], grade 2 diastolic dysfunction Cardiac monitor 01/2021, frequent PVCs 11.9% burden   Past Medical History:  Diagnosis Date   Depression    Diabetes mellitus without complication (HCC)    GERD (gastroesophageal reflux disease)    Headache    History of kidney stones    History of nephrolithiasis 2015   via CT imaging, including an obstructing stone   Hyperlipidemia    Hypertension 12/2019   per patient, she has never been treated for high blood pressure   Low back pain 12/2019   Neuromuscular disorder ([place])    neuropathy    Past Surgical History:  Procedure Laterality Date   CHOLECYSTECTOMY  1998   CYSTOSCOPY WITH STENT PLACEMENT Right 12/09/2019   Procedure: CYSTOSCOPY WITH STENT PLACEMENT;  Surgeon: Abbie Sons, MD;  Location: [facility] ORS;  Service: Urology;  Laterality:  Right;   CYSTOSCOPY/RETROGRADE/URETEROSCOPY  12/29/2019   Procedure: CYSTOSCOPY/RETROGRADE/URETEROSCOPY;  Surgeon: Abbie Sons, MD;  Location: [facility] ORS;  Service: Urology;;    Current Medications: Current Meds  Medication Sig   insulin aspart (NOVOLOG) 100 UNIT/ML FlexPen Inject up to 26 units with breakfast according to sliding scale, then 10 units with lunch, and 10 units with dinner.   insulin glargine (LANTUS SOLOSTAR) 100 UNIT/ML Solostar Pen Inject 80 Units into the skin once daily at bedtime.   Insulin Pen Needle (NOVOFINE PEN NEEDLE) 32G X 6 MM MISC USE AS DIRECTED.   Insulin Pen Needle 32G X 4 MM MISC Use as directed with insulin   metoprolol succinate (TOPROL XL) 25 MG 24 hr tablet Take 1 tablet (25 mg total) by mouth once daily.   potassium citrate (UROCIT-K) 10 MEQ (1080 MG) SR tablet Take 1 tablet (10 mEq total) by mouth 2 (two) times daily with a meal.   Semaglutide,0.25 or 0.5MG /DOS, (OZEMPIC, 0.25 OR 0.5 MG/DOSE,) 2 MG/1.5ML SOPN Inject 0.25 mg into the skin once a week for 28 days. Then inject 0.5 mg into the skin once a week thereafter.   SUMAtriptan (IMITREX) 50 MG tablet Take 1 tablet (50 mg total) by mouth once daily for 1 dose for migraines. May repeat in 2 hours if headache persists or recurs. (Max of 2 tablets in 24 hours) (Patient taking differently: Take 50 mg by mouth  once. Last needed in December 2022)   torsemide (DEMADEX) 20 MG tablet Take 1 tablet (20 mg total) by mouth once daily.     Allergies:   Penicillins, Topiramate, Topiramate er, and Valtrex [valacyclovir]   Social History   Socioeconomic History   Marital status: Single    Spouse name: Not on file   Number of children: Not on file   Years of education: Not on file   Highest education level: Not on file  Occupational History   Occupation: not currently working  Tobacco Use   Smoking status: Never   Smokeless tobacco: Never  Vaping Use   Vaping Use: Never used  Substance and Sexual  Activity   Alcohol use: No   Drug use: No   Sexual activity: Never  Other Topics Concern   Not on file  Social History Narrative   - Needs a blood sugar monitor       Patient said she is living with her mom and things are going ok. Mom provides for her food, transport, etc.       Patient wants to wait until next visit before any information is shared.    Social Determinants of Health   Financial Resource Strain: Not on file  Food Insecurity: No Food Insecurity   Worried About Charity fundraiser in the Last Year: Never true   Ran Out of Food in the Last Year: Never true  Transportation Needs: No Transportation Needs   Lack of Transportation (Medical): No   Lack of Transportation (Non-Medical): No  Physical Activity: Not on file  Stress: Not on file  Social Connections: Not on file     Family History: The patient's family history includes Diabetes in her maternal great-grandmother; Heart disease in her mother; Hypertension in her mother; Kidney failure in her father; Pancreatic cancer in her maternal grandmother; Sleep apnea in her sister.  ROS:   Please see the history of present illness.     All other systems reviewed and are negative.  EKGs/Labs/Other Studies Reviewed:    The following studies were reviewed today:   EKG:  EKG is ordered today. Ekg shows normal sinus rhythm, occasional pvc's  Recent Labs: 08/31/2020: ALT 15 01/27/2021: Magnesium 1.6; TSH 1.990 02/15/2021: Hemoglobin 11.3; Platelets 390 03/17/2021: BUN 18; Creatinine, Ser 0.99; Potassium 4.0; Sodium 131  Recent Lipid Panel    Component Value Date/Time   CHOL 142 11/11/2019 1254   TRIG 84 11/11/2019 1254   HDL 32 (L) 11/11/2019 1254   CHOLHDL 4.4 11/11/2019 1254   CHOLHDL 4.3 03/29/2007 0615   VLDL 17 03/29/2007 0615   LDLCALC 94 11/11/2019 1254     Risk Assessment/Calculations:      Physical Exam:    VS:  BP 120/70 (BP Location: Left Arm, Patient Position: Sitting, Cuff Size: Large)     Pulse 64    Ht 5\' 8"  (1.727 m)    Wt 280 lb 4 oz (127.1 kg)    SpO2 98%    BMI 42.61 kg/m     Wt Readings from Last 3 Encounters:  06/13/21 280 lb 4 oz (127.1 kg)  06/06/21 280 lb 4.8 oz (127.1 kg)  05/30/21 273 lb 8 oz (124.1 kg)     GEN:  Well nourished, well developed in no acute distress HEENT: Normal NECK: No JVD; No carotid bruits LYMPHATICS: No lymphadenopathy CARDIAC: RRR, no murmurs, rubs, gallops RESPIRATORY:  Clear to auscultation without rales, wheezing or rhonchi  ABDOMEN: Soft, non-tender, non-distended MUSCULOSKELETAL:  trace edema SKIN: Warm and dry NEUROLOGIC:  Alert and oriented x 3 PSYCHIATRIC:  Normal affect   ASSESSMENT:    1. Frequent PVCs   2. Diastolic dysfunction   3. Primary hypertension   4. Morbid obesity ([place])     PLAN:    In order of problems listed above:  Frequent PVCs noted on cardiac monitor 11.9% burden, echo 11/2020 with preserved ejection fraction.  contToprol-XL 25 mg daily. Diastolic dysfunction, trace edema.  Etiology likely morbid obesity.  Cont torsemide 20 mg daily.  Check BMP in 1 week. Hypertension, BP controlled.  cont torsemide, Toprol-XL. Morbid obesity, low calorie diet advised.   Follow-up in 6 months. Patient under a lot of stress due to current living situation.  A lot of time spent counseling and reassuring patient.   Medication Adjustments/Labs and Tests Ordered: Current medicines are reviewed at length with the patient today.  Concerns regarding medicines are outlined above.  Orders Placed This Encounter  Procedures   EKG 12-Lead    No orders of the defined types were placed in this encounter.    Patient Instructions  Medication Instructions:  Your physician recommends that you continue on your current medications as directed. Please refer to the Current Medication list given to you today.  *If you need a refill on your cardiac medications before your next appointment, please call your pharmacy*   Lab  Work: None ordered If you have labs (blood work) drawn today and your tests are completely normal, you will receive your results only by: [place] (if you have MyChart) OR A paper copy in the mail If you have any lab test that is abnormal or we need to change your treatment, we will call you to review the results.   Testing/Procedures: None ordered   Follow-Up: At [facility], you and your health needs are our priority.  As part of our continuing mission to provide you with exceptional heart care, we have created designated Provider Care Teams.  These Care Teams include your primary Cardiologist (physician) and Advanced Practice Providers (APPs -  Physician Assistants and Nurse Practitioners) who all work together to provide you with the care you need, when you need it.  We recommend signing up for the patient portal called "MyChart".  Sign up information is provided on this After Visit Summary.  MyChart is used to connect with patients for Virtual Visits (Telemedicine).  Patients are able to view lab/test results, encounter notes, upcoming appointments, etc.  Non-urgent messages can be sent to your provider as well.   To learn more about what you can do with MyChart, go to [email or web address].    Your next appointment:   Your physician wants you to follow-up in: 6 months You will receive a reminder letter in the mail two months in advance. If you don't receive a letter, please call our office to schedule the follow-up appointment.   The format for your next appointment:   In Person  Provider:   You may see Kate Sable, MD or one of the following Advanced Practice Providers on your designated Care Team:   Murray Hodgkins, NP Christell Faith, PA-C Cadence Kathlen Mody, [place]    Other Instructions N/A    Signed, Kate Sable, MD  06/13/2021 10:31 AM    [place]

## 2021-06-28 ENCOUNTER — Telehealth: Payer: Self-pay | Admitting: Pharmacist

## 2021-06-28 NOTE — Telephone Encounter (Signed)
06/28/2021 8:11:37 AM - Novolog Flex & Ozempic Pending -- Tarry Kos - Wednesday, June 28, 2021 8:09 AM --  Received dr signed portion for Novolog Flex & Ozempic. Holding for pt. signed portion, POI & taxes.

## 2021-06-30 ENCOUNTER — Other Ambulatory Visit: Payer: Self-pay

## 2021-07-04 ENCOUNTER — Other Ambulatory Visit: Payer: Self-pay

## 2021-07-05 ENCOUNTER — Other Ambulatory Visit: Payer: Self-pay | Admitting: Urology

## 2021-07-05 ENCOUNTER — Other Ambulatory Visit: Payer: Self-pay

## 2021-07-05 MED ORDER — POTASSIUM CITRATE ER 10 MEQ (1080 MG) PO TBCR
EXTENDED_RELEASE_TABLET | ORAL | 3 refills | Status: DC
  Filled 2021-07-05: qty 60, 30d supply, fill #0
  Filled 2021-09-18: qty 60, 30d supply, fill #1
  Filled 2021-12-07: qty 60, 30d supply, fill #2
  Filled 2021-12-08: qty 60, 30d supply, fill #0
  Filled 2022-01-31: qty 60, 30d supply, fill #1

## 2021-07-07 ENCOUNTER — Telehealth: Payer: Self-pay | Admitting: Pharmacist

## 2021-07-07 NOTE — Telephone Encounter (Signed)
07/07/2021 10:52:35 AM - Novolog flex & Ozempic faxed to [place] - Friday, July 07, 2021 10:49 AM -- Faxed PAP enrollment forms to [employer] for [employer].

## 2021-07-24 ENCOUNTER — Other Ambulatory Visit: Payer: Self-pay

## 2021-07-27 ENCOUNTER — Other Ambulatory Visit: Payer: Self-pay

## 2021-08-01 ENCOUNTER — Other Ambulatory Visit: Payer: Self-pay

## 2021-08-01 ENCOUNTER — Other Ambulatory Visit: Payer: Self-pay | Admitting: Gerontology

## 2021-08-01 ENCOUNTER — Ambulatory Visit: Payer: Self-pay | Admitting: Gerontology

## 2021-08-01 DIAGNOSIS — E1165 Type 2 diabetes mellitus with hyperglycemia: Secondary | ICD-10-CM

## 2021-08-01 MED ORDER — SEMAGLUTIDE(0.25 OR 0.5MG/DOS) 2 MG/1.5ML ~~LOC~~ SOPN
0.5000 mg | PEN_INJECTOR | SUBCUTANEOUS | 3 refills | Status: DC
  Filled 2021-08-01 – 2021-08-02 (×2): qty 1.5, 28d supply, fill #0
  Filled 2021-09-11 – 2021-10-04 (×3): qty 1.5, 28d supply, fill #1

## 2021-08-02 ENCOUNTER — Other Ambulatory Visit: Payer: Self-pay

## 2021-08-03 ENCOUNTER — Other Ambulatory Visit: Payer: Self-pay

## 2021-08-09 ENCOUNTER — Other Ambulatory Visit: Payer: Self-pay

## 2021-08-12 ENCOUNTER — Other Ambulatory Visit: Payer: Self-pay

## 2021-08-12 ENCOUNTER — Encounter: Payer: Self-pay | Admitting: Intensive Care

## 2021-08-12 ENCOUNTER — Emergency Department
Admission: EM | Admit: 2021-08-12 | Discharge: 2021-08-12 | Disposition: A | Discharge status: Home / Self Care | Payer: Self-pay | Attending: Emergency Medicine | Admitting: Emergency Medicine

## 2021-08-12 DIAGNOSIS — R000 Tachycardia, unspecified: Secondary | ICD-10-CM | POA: Insufficient documentation

## 2021-08-12 DIAGNOSIS — E119 Type 2 diabetes mellitus without complications: Secondary | ICD-10-CM | POA: Insufficient documentation

## 2021-08-12 DIAGNOSIS — K047 Periapical abscess without sinus: Secondary | ICD-10-CM | POA: Insufficient documentation

## 2021-08-12 DIAGNOSIS — I10 Essential (primary) hypertension: Secondary | ICD-10-CM | POA: Insufficient documentation

## 2021-08-12 DIAGNOSIS — Z20822 Contact with and (suspected) exposure to covid-19: Secondary | ICD-10-CM | POA: Insufficient documentation

## 2021-08-12 DIAGNOSIS — J020 Streptococcal pharyngitis: Secondary | ICD-10-CM | POA: Insufficient documentation

## 2021-08-12 LAB — RESP PANEL BY RT-PCR (FLU A&B, COVID) ARPGX2
Influenza A by PCR: NEGATIVE
Influenza B by PCR: NEGATIVE
SARS Coronavirus 2 by RT PCR: NEGATIVE

## 2021-08-12 LAB — GROUP A STREP BY PCR: Group A Strep by PCR: DETECTED — AB

## 2021-08-12 MED ORDER — ACETAMINOPHEN 500 MG PO TABS
1000.0000 mg | ORAL_TABLET | Freq: Once | ORAL | Status: AC
  Administered 2021-08-12: 1000 mg via ORAL
  Filled 2021-08-12: qty 2

## 2021-08-12 MED ORDER — CLINDAMYCIN HCL 150 MG PO CAPS
300.0000 mg | ORAL_CAPSULE | Freq: Once | ORAL | Status: AC
  Administered 2021-08-12: 300 mg via ORAL
  Filled 2021-08-12: qty 2

## 2021-08-12 MED ORDER — CLINDAMYCIN HCL 300 MG PO CAPS: 300.0000 mg | ORAL_CAPSULE | Freq: Three times a day (TID) | ORAL | 0 refills | Status: AC

## 2021-08-12 MED ORDER — DEXAMETHASONE 4 MG PO TABS
10.0000 mg | ORAL_TABLET | Freq: Once | ORAL | Status: AC
  Administered 2021-08-12: 10 mg via ORAL
  Filled 2021-08-12: qty 3

## 2021-08-12 NOTE — ED Provider Notes (Signed)
? ?[facility] ?Provider Note ? ? ? Event Date/Time  ? First MD Initiated Contact with Patient 08/12/21 1153   ?  (approximate) ? ? ?History  ? ?Chief Complaint ?Sore Throat ? ? ?HPI ?[name] is a 46 y.o. female, history of type 2 diabetes, obesity, hypertension, migraines, depression, GERD, presents to the emergency department for evaluation of sore throat.  Patient states that she has been experiencing sore throat for the past week, reports difficulty eating due to the pain.  She additionally states that she has been running fevers as high as 100 and 102 over the past few days.  Additionally, she states that she has also had a small abscess underneath one of her teeth that has developed in the past few days Denies any recent sick contacts.  Denies chest pain, shortness of breath, trismus, neck pain, abdominal pain, nausea/vomiting, rashes, lightheadedness/dizziness, or urinary symptoms. ? ?History Limitations: No limitations. ? ?  ? ? ?Physical Exam  ?Triage Vital Signs: ?ED Triage Vitals  ?Enc Vitals Group  ?   BP 08/12/21 1146 127/61  ?   Pulse Rate 08/12/21 1146 (!) 103  ?   Resp 08/12/21 1146 18  ?   Temp 08/12/21 1146 99.9 ?F (37.7 ?C)  ?   Temp Source 08/12/21 1146 Oral  ?   SpO2 08/12/21 1146 96 %  ?   Weight 08/12/21 1147 250 lb (113.4 kg)  ?   Height 08/12/21 1147 5\' 8"  (1.727 m)  ?   Head Circumference --   ?   Peak Flow --   ?   Pain Score 08/12/21 1147 8  ?   Pain Loc --   ?   Pain Edu? --   ?   Excl. in [place]? --   ? ? ?Most recent vital signs: ?Vitals:  ? 08/12/21 1146 08/12/21 1341  ?BP: 127/61 126/78  ?Pulse: (!) 103 100  ?Resp: 18 18  ?Temp: 99.9 ?F (37.7 ?C) 99.2 ?F (37.3 ?C)  ?SpO2: 96% 98%  ? ? ?General: Awake, NAD.  ?Skin: Warm, dry.  ?CV: Good peripheral perfusion.  ?Resp: Normal effort.  ?Abd: Soft, non-tender. No distention.  ?Neuro: At baseline. No gross neurological deficits.  ?Other: Tonsils are exquisitely swollen with exudates.  Uvula midline.  No  evidence of peritonsillar abscess.  Patient does not have any trismus on exam.  No swelling underneath the tongue.  No notable tenderness or masses present along the neck.  Patient does have what appears to be a small growth/abscess underneath her 21st tooth.  Mild tenderness to palpation.  No tongue swelling present. ? ?Physical Exam ? ? ? ?ED Results / Procedures / Treatments  ?Labs ?(all labs ordered are listed, but only abnormal results are displayed) ?Labs Reviewed  ?GROUP A STREP BY PCR - Abnormal; Notable for the following components:  ?    Result Value  ? Group A Strep by PCR DETECTED (*)   ? All other components within normal limits  ?RESP PANEL BY RT-PCR (FLU A&B, COVID) [id number]  ? ? ? ?EKG ?Not applicable. ? ? ?RADIOLOGY ? ?ED Provider Interpretation: Not applicable ? ?No results found. ? ?PROCEDURES: ? ?Critical Care performed: None. ? ?..Incision and Drainage ? ?Date/Time: 08/12/2021 1:45 PM ?Performed by: Teodoro Spray, PA ?Authorized by: Teodoro Spray, PA  ? ?Consent:  ?  Consent obtained:  Verbal ?  Consent given by:  Patient ?  Risks discussed:  Bleeding and incomplete drainage ?Universal protocol:  ?  Patient identity confirmed:  Verbally with patient ?Location:  ?  Type:  Abscess ?  Size:  11mm ?  Location:  Mouth ?  Mouth location:  Alveolar process ?Sedation:  ?  Sedation type:  None ?Anesthesia:  ?  Anesthesia method:  None ?Procedure details:  ?  Incision types:  Stab incision ?  Drainage:  Bloody ?  Drainage amount:  Scant ?  Wound treatment:  Wound left open ?  Packing materials:  None ?Post-procedure details:  ?  Procedure completion:  Tolerated well, no immediate complications ? ? ? ?MEDICATIONS ORDERED IN ED: ?Medications  ?dexamethasone (DECADRON) tablet 10 mg (10 mg Oral Given 08/12/21 1219)  ?acetaminophen (TYLENOL) tablet 1,000 mg (1,000 mg Oral Given 08/12/21 1218)  ?clindamycin (CLEOCIN) capsule 300 mg (300 mg Oral Given 08/12/21 1319)  ? ? ? ?IMPRESSION / MDM / ASSESSMENT AND  PLAN / ED COURSE  ?I reviewed the triage vital signs and the nursing notes. ?             ?               ? ? ?Differential diagnosis includes, but is not limited to, strep pharyngitis, Ludwig's angina, periapical abscess, peritonsillar abscess, influenza, COVID-19, viral URI ? ?ED Course ?Patient appears well, but uncomfortable.  Low-grade fever at 99.9 ?F.  Mildly tachycardic 103.  Given the patient's diffusely swollen tonsils, we will go ahead treat with dexamethasone. ? ?Respiratory panel negative for COVID-19 or influenza ? ?Strep PCR positive. ? ?Attempted drainage with 18-gauge needle of the suspected dental abscess.  Patient stated that she did not want any local anesthesia.  Unable to get any purulent drainage.  See above for details ? ?Assessment/Plan ?Presentation consistent with strep pharyngitis.  Confirmed by PCR.  There does appear to be a possible dental infection as well.  No evidence of Ludwig angina at this time warranting further imaging.  Will cover for both strep and dental infection with clindamycin.  Advised patient to follow-up with her primary care provider within the next 2 days to ensure improvement in symptoms. We will discharge this patient ? ?Patient was provided with anticipatory guidance, return precautions, and educational material. Encouraged the patient to return to the emergency department at any time if they begin to experience any new or worsening symptoms.  ? ?  ? ? ?FINAL CLINICAL IMPRESSION(S) / ED DIAGNOSES  ? ?Final diagnoses:  ?Strep throat  ? ? ? ?Rx / DC Orders  ? ?ED Discharge Orders   ? ?      Ordered  ?  clindamycin (CLEOCIN) 300 MG capsule  3 times daily       ? 08/12/21 1318  ? ?  ?  ? ?  ? ? ? ?Note:  This document was prepared using Dragon voice recognition software and may include unintentional dictation errors. ?  ?Teodoro Spray, [place] ?08/12/21 1348 ? ?  ?Vanessa [place], MD ?08/13/21 [phone] ? ?

## 2021-08-12 NOTE — ED Triage Notes (Signed)
Patient c/o sore throat X1 week. HX strep. ?

## 2021-08-12 NOTE — Discharge Instructions (Addendum)
-  Take all of your antibiotics as prescribed. ?-You may take Tylenol/ibuprofen as needed for fever/pain. ?-Recommend over-the-counter cough drops as well ?-Follow-up with your primary care provider or return the emergency department if your symptoms fail to improve. ?

## 2021-08-15 ENCOUNTER — Other Ambulatory Visit: Payer: Self-pay

## 2021-08-16 ENCOUNTER — Other Ambulatory Visit: Payer: Self-pay

## 2021-08-22 ENCOUNTER — Other Ambulatory Visit: Payer: Self-pay

## 2021-08-23 ENCOUNTER — Other Ambulatory Visit: Payer: Self-pay

## 2021-08-23 DIAGNOSIS — E1165 Type 2 diabetes mellitus with hyperglycemia: Secondary | ICD-10-CM

## 2021-08-24 LAB — HEMOGLOBIN A1C
Est. average glucose Bld gHb Est-mCnc: 206 mg/dL
Hgb A1c MFr Bld: 8.8 % — ABNORMAL HIGH (ref 4.8–5.6)

## 2021-08-27 LAB — URINALYSIS, ROUTINE W REFLEX MICROSCOPIC
Bilirubin, UA: NEGATIVE
Glucose, UA: NEGATIVE
Ketones, UA: NEGATIVE
Leukocytes,UA: NEGATIVE
Nitrite, UA: NEGATIVE
Protein,UA: NEGATIVE
RBC, UA: NEGATIVE
Specific Gravity, UA: 1.006 (ref 1.005–1.030)
Urobilinogen, Ur: 0.2 mg/dL (ref 0.2–1.0)
pH, UA: 5.5 (ref 5.0–7.5)

## 2021-08-27 LAB — MICROALBUMIN / CREATININE URINE RATIO
Creatinine, Urine: 8.4 mg/dL
Microalbumin, Urine: <3 ug/mL

## 2021-08-29 ENCOUNTER — Other Ambulatory Visit: Payer: Self-pay

## 2021-08-29 ENCOUNTER — Ambulatory Visit: Payer: Self-pay

## 2021-08-29 ENCOUNTER — Ambulatory Visit: Payer: Self-pay | Admitting: Student

## 2021-08-29 VITALS — BP 120/63 | HR 87 | Temp 98.3°F | Resp 16 | Ht 68.0 in | Wt 273.5 lb

## 2021-08-29 DIAGNOSIS — Z794 Long term (current) use of insulin: Secondary | ICD-10-CM

## 2021-08-29 NOTE — Progress Notes (Deleted)
Wt Readings from Last 3 Encounters:  ?08/29/21 273 lb 8 oz (124.1 kg)  ?08/12/21 250 lb (113.4 kg)  ?06/13/21 280 lb 4 oz (127.1 kg)  ? ? ?

## 2021-08-30 ENCOUNTER — Other Ambulatory Visit: Payer: Self-pay

## 2021-08-30 NOTE — Progress Notes (Signed)
Follow up Diabetes/ Endocrine [facility]  ?  ? Patient ID: [name], female   DOB: 1975-09-20, 46 y.o.   MRN: [id number] ?Assessment:  ?[name] is a 46 y.o. female who is seen in follow up for Type 2 diabetes mellitus with diabetic polyneuropathy, with long-term current use of insulin (HCC) [E11.42, Z79.4] at the request of [name], [name], NP. ? ?Encounter Diagnoses ?1. Type 2 diabetes mellitus with diabetic polyneuropathy, with long-term current use of insulin (HCC)   ? ? ?Assessment  ?Patient is a 46 y.o. female with a history of T2DM with peripheral neuropathy, HTN, hyperlipidemia, and nephrolithiasis who presents for routine DM care. She is currently not at goal but is improving. Her HbA1c was 8.8 on 08/23/2021, down from 11.9 on 05/17/2021.  ? ?Plan:  ?   ?Patient was congratulated on the significant improvement notes since her last visit. ?T2DM ?- Increase semaglutide/Ozempic to 1 mg weekly (up from 0.5 mg). ?- Follow up in 3 months for routine DM visit and lab. ?- Refer to Ophthalmology. ?Patient was counseled on the long-term goal of eventually discontinuing Novolog as she continues to improve. She was also counseled on the benefit of adding an SGLT-2 inhibitor as her insulin dose is reduced with continued improvement in her DM management. ?Patient was also educated on the association between food intake and BG, more specifically that she does not need to eat to prevent high blood sugar, but does need to eat to prevent low BG. She was also advised to inform her PCP if her fasting BG < 70. ? ?Bilateral upper arm pain ?- Try heat or ice patch PRN. ?- Take 2*500 mg Tylenol on schedule (q6h) with intermittent dose of ibuprofen. ?Patient denied any liver issues and was educated on the preference for Tylenol over ibuprofen for the management of more chronic pain due to higher liver toxicity associated with chronic use of ibuprofen. She was advised not to take more than 4,000 mg  of Tylenol per day. She was also advised to follow up with her PCP. ? ?Discomfort in feet ?Patient was reassured that her symptoms as described are unlikely to be related to her DM and was educated on symptoms of peripheral neuropathy. She was also encouraged to follow up with her PCP. ? ?  There are no Patient Instructions on file for this visit.  ? ?No orders of the defined types were placed in this encounter. ?   ?Subjective:  ?Patient is a 46 year old female with a history of T2DM, HTN, hyperlipidemia, nephrolithiasis, depression, and chronic low back pain, who presents for routine DM follow-up. She states that she has been feeling better about managing her DM, measuring her BG twice a day and reporting fasting glucose around 126-130s (with lowest at 89 and highest this morning at 35). Her current DM regimen includes insulin aspart/Novolog  (sliding scale up to 26 units at breakfast, 10 units at lunch and 10 units at dinner), insulin glargine/Lantus (80 units at bedtime), and semaglutide/Ozempic (0.5 mg injection weekly). She states that she often skips her lunch dose of insulin Novolog. She states that she forces herself to eat sometimes because she was told that she needed to eat to prevent high blood sugar. ?She denies any side effects including GI symptoms/nausea since initiating Ozempic. ? ?A discrepancy was noted between patient's reported fasting glucose around 126-130s and her reported frequent insulin Novolog injections up to 26 units at breakfast. She brought her glucometer, which indicated BG readings  some of which were in the 100s and 200s but most of which above 200 (in the 300s, and occasionally 400s). The chronological order was not obvious as the glucometer settings seemed incorrect/did not match the patient's report of twice daily BG measurements. ? ?Patient continues to report polyuria and polydipsia, indicating no apparent change since starting Ozempic. She denies symptoms of hypoglycemia. She  endorses swelling in her feet at night despite elevating her legs, and pain in her hands. She reports a sensation "walking on uneven rocks ... like rocks are stuck in [her] feet ..." which causes mostly discomfort and occasional pain.  She states that her most recent eye exam was over 1 year ago. ? ?Patient expresses concerns about recent onset of bilateral pain in her upper arms for the past few weeks (<1 month) despite no recall of injury or falls. She currently manages this pain, which is worse at night, with Ibuprofen (3*200 mg) and some Tylenol. She also notes feeling a "lump" and pain in her throat when swallowing for the past 2 weeks. She states that the onset of these symptoms coincide with her  most recent Step throat diagnosis (though she has had recurrent Strep throat but never experienced similar symptoms in the past). She states that she completed a week-long course of clindamycin TID. ? ?Patient reports experiencing a more stable living condition which allows her to cook more often, incorporate more vegetables in her daily diet, and watch how she eats. She reports going on walks in the park with her niece as a way to help her niece get some exercise after a recent physician appointment where she was deemed overweight. ? ?  ? ?Review of Systems  ?Cardiovascular:  Positive for leg swelling. Negative for chest pain.  ?Endocrine: Positive for polydipsia and polyuria.  ?Musc: pain in upper arms bilaterally ? ?[name]  has a past medical history of Depression, Diabetes mellitus without complication (HCC), GERD (gastroesophageal reflux disease), Headache, History of kidney stones, History of nephrolithiasis (2015), Hyperlipidemia, Hypertension (12/2019), Low back pain (12/2019), and Neuromuscular disorder (HCC). ? ?Family History, Social History, current Medications and allergies reviewed and updated in Epic.  ?Objective:  ?  ?Blood pressure 120/63, pulse 87, temperature 98.3 ?F (36.8 ?C),  temperature source Oral, resp. rate 16, height 5\' 8"  (1.727 m), weight 273 lb 8 oz (124.1 kg), last menstrual period 03/06/2021, SpO2 93 %. ?Physical Exam ?Constitutional:   ?   Appearance: Normal appearance.  ?Pulmonary:  ?   Effort: Pulmonary effort is normal.  ?Neurological:  ?   Mental Status: She is alert.  ?GI: ? Dysphagia with occasional pain ?Skin: ? Edema in lower extremities ?   ? ?Data : I have personally reviewed pertinent labs and imaging studies, if indicated,  with the patient in clinic today.  ? ?Lab Orders  ?No laboratory test(s) ordered today  ? ? ?HC Readings from Last 3 Encounters:  ?No data found for [facility]  ? ? ?Wt Readings from Last 3 Encounters:  ?08/29/21 273 lb 8 oz (124.1 kg)  ?08/12/21 250 lb (113.4 kg)  ?06/13/21 280 lb 4 oz (127.1 kg)  ? ? ? ? ? ? ?

## 2021-08-31 ENCOUNTER — Encounter: Payer: Self-pay | Admitting: Gerontology

## 2021-08-31 ENCOUNTER — Ambulatory Visit: Payer: Self-pay | Admitting: Gerontology

## 2021-08-31 ENCOUNTER — Other Ambulatory Visit: Payer: Self-pay

## 2021-08-31 ENCOUNTER — Other Ambulatory Visit: Payer: Self-pay | Admitting: *Deleted

## 2021-08-31 VITALS — BP 126/68 | HR 89 | Temp 98.2°F | Resp 16 | Ht 69.0 in | Wt 278.0 lb

## 2021-08-31 DIAGNOSIS — M545 Low back pain, unspecified: Secondary | ICD-10-CM

## 2021-08-31 DIAGNOSIS — G629 Polyneuropathy, unspecified: Secondary | ICD-10-CM

## 2021-08-31 DIAGNOSIS — E1142 Type 2 diabetes mellitus with diabetic polyneuropathy: Secondary | ICD-10-CM

## 2021-08-31 DIAGNOSIS — R600 Localized edema: Secondary | ICD-10-CM

## 2021-08-31 DIAGNOSIS — N200 Calculus of kidney: Secondary | ICD-10-CM

## 2021-08-31 DIAGNOSIS — M79601 Pain in right arm: Secondary | ICD-10-CM

## 2021-08-31 NOTE — Patient Instructions (Signed)
Carbohydrate Counting for Diabetes Mellitus, Adult ?Carbohydrate counting is a method of keeping track of how many carbohydrates you eat. Eating carbohydrates increases the amount of sugar (glucose) in the blood. Counting how many carbohydrates you eat improves how well you manage your blood glucose. This, in turn, helps you manage your diabetes. ?Carbohydrates are measured in grams (g) per serving. It is important to know how many carbohydrates (in grams or by serving size) you can have in each meal. This is different for every person. A dietitian can help you make a meal plan and calculate how many carbohydrates you should have at each meal and snack. ?What foods contain carbohydrates? ?Carbohydrates are found in the following foods: ?Grains, such as breads and cereals. ?Dried beans and soy products. ?Starchy vegetables, such as potatoes, peas, and corn. ?Fruit and fruit juices. ?Milk and yogurt. ?Sweets and snack foods, such as cake, cookies, candy, chips, and soft drinks. ?How do I count carbohydrates in foods? ?There are two ways to count carbohydrates in food. You can read food labels or learn standard serving sizes of foods. You can use either of these methods or a combination of both. ?Using the Nutrition Facts label ?The Nutrition Facts list is included on the labels of almost all packaged foods and beverages in the [place]. It includes: ?The serving size. ?Information about nutrients in each serving, including the grams of carbohydrate per serving. ?To use the Nutrition Facts, decide how many servings you will have. Then, multiply the number of servings by the number of carbohydrates per serving. The resulting number is the total grams of carbohydrates that you will be having. ?Learning the standard serving sizes of foods ?When you eat carbohydrate foods that are not packaged or do not include Nutrition Facts on the label, you need to measure the servings in order to count the grams of  carbohydrates. ?Measure the foods that you will eat with a food scale or measuring cup, if needed. ?Decide how many standard-size servings you will eat. ?Multiply the number of servings by 15. For foods that contain carbohydrates, one serving equals 15 g of carbohydrates. ?For example, if you eat 2 cups or 10 oz (300 g) of strawberries, you will have eaten 2 servings and 30 g of carbohydrates (2 servings x 15 g = 30 g). ?For foods that have more than one food mixed, such as soups and casseroles, you must count the carbohydrates in each food that is included. ?The following list contains standard serving sizes of common carbohydrate-rich foods. Each of these servings has about 15 g of carbohydrates: ?1 slice of bread. ?1 six-inch (15 cm) tortilla. ?? cup or 2 oz (53 g) cooked rice or pasta. ?? cup or 3 oz (85 g) cooked or canned, drained and rinsed beans or lentils. ?? cup or 3 oz (85 g) starchy vegetable, such as peas, corn, or squash. ?? cup or 4 oz (120 g) hot cereal. ?? cup or 3 oz (85 g) boiled or mashed potatoes, or ? or 3 oz (85 g) of a large baked potato. ?? cup or 4 fl oz (118 mL) fruit juice. ?1 cup or 8 fl oz (237 mL) milk. ?1 small or 4 oz (106 g) apple. ?? or 2 oz (63 g) of a medium banana. ?1 cup or 5 oz (150 g) strawberries. ?3 cups or 1 oz (28.3 g) popped popcorn. ?What is an example of carbohydrate counting? ?To calculate the grams of carbohydrates in this sample meal, follow the steps   shown below. ?Sample meal ?3 oz (85 g) chicken breast. ?? cup or 4 oz (106 g) brown rice. ?? cup or 3 oz (85 g) corn. ?1 cup or 8 fl oz (237 mL) milk. ?1 cup or 5 oz (150 g) strawberries with sugar-free whipped topping. ?Carbohydrate calculation ?Identify the foods that contain carbohydrates: ?Rice. ?Corn. ?Milk. ?Strawberries. ?Calculate how many servings you have of each food: ?2 servings rice. ?1 serving corn. ?1 serving milk. ?1 serving strawberries. ?Multiply each number of servings by 15 g: ?2 servings rice x 15  g = 30 g. ?1 serving corn x 15 g = 15 g. ?1 serving milk x 15 g = 15 g. ?1 serving strawberries x 15 g = 15 g. ?Add together all of the amounts to find the total grams of carbohydrates eaten: ?30 g + 15 g + 15 g + 15 g = 75 g of carbohydrates total. ?What are tips for following this plan? ?Shopping ?Develop a meal plan and then make a shopping list. ?Buy fresh and frozen vegetables, fresh and frozen fruit, dairy, eggs, beans, lentils, and whole grains. ?Look at food labels. Choose foods that have more fiber and less sugar. ?Avoid processed foods and foods with added sugars. ?Meal planning ?Aim to have the same number of grams of carbohydrates at each meal and for each snack time. ?Plan to have regular, balanced meals and snacks. ?Where to find more information ?American Diabetes Association: diabetes.org ?Centers for Disease Control and Prevention: cdc.gov ?Academy of Nutrition and Dietetics: eatright.org ?Association of Diabetes Care & Education Specialists: diabeteseducator.org ?Summary ?Carbohydrate counting is a method of keeping track of how many carbohydrates you eat. ?Eating carbohydrates increases the amount of sugar (glucose) in your blood. ?Counting how many carbohydrates you eat improves how well you manage your blood glucose. This helps you manage your diabetes. ?A dietitian can help you make a meal plan and calculate how many carbohydrates you should have at each meal and snack. ?This information is not intended to replace advice given to you by your health care provider. Make sure you discuss any questions you have with your health care provider. ?Document Revised: 12/02/2019 Document Reviewed: 12/02/2019 ?Elsevier Patient Education ? 2023 Elsevier Inc. ?DASH Eating Plan ?DASH stands for Dietary Approaches to Stop Hypertension. The DASH eating plan is a healthy eating plan that has been shown to: ?Reduce high blood pressure (hypertension). ?Reduce your risk for type 2 diabetes, heart disease, and  stroke. ?Help with weight loss. ?What are tips for following this plan? ?Reading food labels ?Check food labels for the amount of salt (sodium) per serving. Choose foods with less than 5 percent of the Daily Value of sodium. Generally, foods with less than 300 milligrams (mg) of sodium per serving fit into this eating plan. ?To find whole grains, look for the word "whole" as the first word in the ingredient list. ?Shopping ?Buy products labeled as "low-sodium" or "no salt added." ?Buy fresh foods. Avoid canned foods and pre-made or frozen meals. ?Cooking ?Avoid adding salt when cooking. Use salt-free seasonings or herbs instead of table salt or sea salt. Check with your health care provider or pharmacist before using salt substitutes. ?Do not fry foods. Cook foods using healthy methods such as baking, boiling, grilling, roasting, and broiling instead. ?Cook with heart-healthy oils, such as olive, canola, avocado, soybean, or sunflower oil. ?Meal planning ? ?Eat a balanced diet that includes: ?4 or more servings of fruits and 4 or more servings of vegetables each day.   Try to fill one-half of your plate with fruits and vegetables. ?6-8 servings of whole grains each day. ?Less than 6 oz (170 g) of lean meat, poultry, or fish each day. A 3-oz (85-g) serving of meat is about the same size as a deck of cards. One egg equals 1 oz (28 g). ?2-3 servings of low-fat dairy each day. One serving is 1 cup (237 mL). ?1 serving of nuts, seeds, or beans 5 times each week. ?2-3 servings of heart-healthy fats. Healthy fats called omega-3 fatty acids are found in foods such as walnuts, flaxseeds, fortified milks, and eggs. These fats are also found in cold-water fish, such as sardines, salmon, and mackerel. ?Limit how much you eat of: ?Canned or prepackaged foods. ?Food that is high in trans fat, such as some fried foods. ?Food that is high in saturated fat, such as fatty meat. ?Desserts and other sweets, sugary drinks, and other foods  with added sugar. ?Full-fat dairy products. ?Do not salt foods before eating. ?Do not eat more than 4 egg yolks a week. ?Try to eat at least 2 vegetarian meals a week. ?Eat more home-cooked food and less restaurant, bu

## 2021-08-31 NOTE — Progress Notes (Signed)
? ?Established Patient Office Visit ? ?Subjective   ?Patient ID: [name], female    DOB: 19-Jul-1975  Age: 46 y.o. MRN: [id number] ? ?Chief Complaint  ?Patient presents with  ? Follow-up  ?  Feeling of pain while swallowing, uncomfortable pain in feet at nighttime while walking.   ? Pain  ? ? ?HPI ? ?[name] is a 46 year old female who has history of depression, type 2 diabetes with peripheral neuropathy, GERD, headache, kidney stones, hypertension presents for follow up visit.  Her HgbA1c done on  08/23/21 decreased from 11.9% to 8.8%. She was also seen on 08/29/21 by the [facility] Endocrinology team. She checks her blood glucose bid, fasting blood glucose reading was 146 mg/dl this morning. She reports taking  Novolog bid, but forgets the noon dose, her Ozempic was increased to 1 mg weekly.  She denies hypo/hyperglycemic symptoms. She was seen at the ED on 08/12/21 and was treated for  Group A Strep. Currently, she reports experiencing intermittent sharp pain to mid back with swallowing food  or water. She states that pain started after being treated for Strep infection. She states that pain resolves after swallowing. Also she has being experiening sharp pain to her bilateral upper arm especially at night when she lays down to sleep. She reports that taking  600 mg Ibuprofen and Tylenol does not relieve pain. She describes pain as throbbing sensation that occurs throughout the night, but does not occur during the day. She states that pain affects her sleep. She also c/o bilateral lower extremity edema that doesn't resolve with elevating her legs at night. She was not wearing compression stockings.  She denies shortness of breath, claudication and orthopnea. She was seen by Dr Kate Sable  Cardiologist on 06/13/21  and was advised to continue Toprol XL 25 mg and 20 mg Torsemide. She also reports worsening peripheral neuropathy, feels like she has stones under her feet. Overall, she states that  she's doing well and offers no further complaint.  ? ? ?Review of Systems  ?Constitutional: Negative.   ?Eyes: Negative.   ?Respiratory: Negative.    ?Cardiovascular:  Positive for leg swelling (+2 LLE swelling). Negative for chest pain, palpitations, orthopnea, claudication and PND.  ?Musculoskeletal:  Positive for back pain.  ?Neurological:  Positive for tingling.  ?Psychiatric/Behavioral: Negative.    ? ?  ?Objective:  ?  ? ?BP 126/68 (BP Location: Left Arm, Patient Position: Sitting, Cuff Size: Normal)   Pulse 89   Temp 98.2 ?F (36.8 ?C) (Oral)   Resp 16   Ht $R'5\' 9"'lC$  (1.753 m)   Wt 278 lb (126.1 kg)   LMP 03/06/2021 (Approximate)   SpO2 97%   BMI 41.05 kg/m?  ?BP Readings from Last 3 Encounters:  ?08/31/21 126/68  ?08/29/21 120/63  ?08/12/21 126/78  ? ?Wt Readings from Last 3 Encounters:  ?08/31/21 278 lb (126.1 kg)  ?08/29/21 273 lb 8 oz (124.1 kg)  ?08/12/21 250 lb (113.4 kg)  ? ? She gained 28 pounds in 20 days ? ?Physical Exam ?HENT:  ?   Head: Normocephalic and atraumatic.  ?   Mouth/Throat:  ?   Mouth: Mucous membranes are moist.  ?Eyes:  ?   Extraocular Movements: Extraocular movements intact.  ?   Conjunctiva/sclera: Conjunctivae normal.  ?   Pupils: Pupils are equal, round, and reactive to light.  ?Cardiovascular:  ?   Rate and Rhythm: Normal rate and regular rhythm.  ?   Pulses: Normal pulses.  ?  Heart sounds: Normal heart sounds.  ?Pulmonary:  ?   Effort: Pulmonary effort is normal.  ?   Breath sounds: Normal breath sounds.  ?Abdominal:  ?   Tenderness: There is no right CVA tenderness or left CVA tenderness.  ?Musculoskeletal:  ?   Right lower leg: Edema present.  ?   Left lower leg: Edema (.+1 edema to bilateral lower extremity) present.  ?Skin: ?   General: Skin is warm.  ?Neurological:  ?   General: No focal deficit present.  ?   Mental Status: She is alert and oriented to person, place, and time. Mental status is at baseline.  ?Psychiatric:     ?   Mood and Affect: Mood normal.     ?    Behavior: Behavior normal.     ?   Thought Content: Thought content normal.     ?   Judgment: Judgment normal.  ? ? ? ?No results found for any visits on 08/31/21. ? ?Last CBC ?Lab Results  ?Component Value Date  ? WBC 7.5 02/15/2021  ? HGB 11.3 02/15/2021  ? HCT 37.2 02/15/2021  ? MCV 76 (L) 02/15/2021  ? MCH 23.0 (L) 02/15/2021  ? RDW 14.3 02/15/2021  ? PLT 390 02/15/2021  ? ?Last metabolic panel ?Lab Results  ?Component Value Date  ? GLUCOSE 497 (H) 03/17/2021  ? NA 131 (L) 03/17/2021  ? K 4.0 03/17/2021  ? CL 88 (L) 03/17/2021  ? CO2 27 03/17/2021  ? BUN 18 03/17/2021  ? CREATININE 0.99 03/17/2021  ? EGFR 71 03/17/2021  ? CALCIUM 9.7 03/17/2021  ? PHOS 4.3 08/31/2020  ? PROT 7.9 08/31/2020  ? ALBUMIN 4.4 08/31/2020  ? LABGLOB 3.5 08/31/2020  ? AGRATIO 1.3 08/31/2020  ? BILITOT 0.4 08/31/2020  ? ALKPHOS 134 (H) 08/31/2020  ? AST 12 08/31/2020  ? ALT 15 08/31/2020  ? ANIONGAP 8 08/09/2020  ? ?Last lipids ?Lab Results  ?Component Value Date  ? CHOL 142 11/11/2019  ? HDL 32 (L) 11/11/2019  ? [place] 94 11/11/2019  ? TRIG 84 11/11/2019  ? CHOLHDL 4.4 11/11/2019  ? ?Last hemoglobin A1c ?Lab Results  ?Component Value Date  ? HGBA1C 8.8 (H) 08/23/2021  ? ?Last thyroid functions ?Lab Results  ?Component Value Date  ? TSH 1.990 01/27/2021  ? T4TOTAL 5.9 10/27/2015  ? ?  ? ?The 10-year ASCVD risk score (Arnett DK, et al., 2019) is: 10.9% ? ?  ?Assessment & Plan:  ? ?1. Low back pain without sciatica, unspecified back pain laterality, unspecified chronicity ?- Unknown etiology of back pain, she was advised to monitor and notify clinic with worsening symptoms. ? ?2. Bilateral edema of lower extremity ?- She gained 28 pounds in 20 days, states that she takes 20 mg Torsemide, was advised to wear compression stockings, elevated legs while sitting or lying in bed. Will check chemistry and BNP. She was advised to go to the ED for worsening symptoms. She was advised to continue on DASH diet. ?- Comp Met (CMET); Future ?- B Nat  Peptide; Future ?- B Nat Peptide ?- Comp Met (CMET) ? ?3. Peripheral polyneuropathy ?- She declines gabapentin because she doesn't want her depression to worsen. She was advised to go to the ED for worsening symptoms. ? ?4. Bilateral arm pain ?- Pain of unknown etiology, advised to monitor and notify clinic for worsening symptoms. ? ?5. Type 2 diabetes mellitus with diabetic polyneuropathy, with long-term current use of insulin ([place]) ?- Her HgbA1c was 8.8%, improving, will  continue on current medication, advised to check blood glucose bid, record and bring log to follow up appointment. She was advised that her fasting blood glucose should be between 80- 130 mg/dl, continue on low carb/non concentrated sweet diet and exercise as tolerated. ? ? ?Return in about 27 days (around 09/27/2021), or if symptoms worsen or fail to improve.  ? ? ?Shamel Galyean Jerold Coombe, NP ? ?

## 2021-09-01 ENCOUNTER — Ambulatory Visit: Payer: Self-pay | Admitting: Urology

## 2021-09-01 ENCOUNTER — Encounter: Payer: Self-pay | Admitting: Urology

## 2021-09-01 LAB — COMPREHENSIVE METABOLIC PANEL
ALT: 6 IU/L (ref 0–32)
AST: 9 IU/L (ref 0–40)
Albumin/Globulin Ratio: 1.3 (ref 1.2–2.2)
Albumin: 4 g/dL (ref 3.8–4.8)
Alkaline Phosphatase: 99 IU/L (ref 44–121)
BUN/Creatinine Ratio: 11 (ref 9–23)
BUN: 11 mg/dL (ref 6–24)
Bilirubin Total: 0.3 mg/dL (ref 0.0–1.2)
CO2: 25 mmol/L (ref 20–29)
Calcium: 9.6 mg/dL (ref 8.7–10.2)
Chloride: 101 mmol/L (ref 96–106)
Creatinine, Ser: 0.98 mg/dL (ref 0.57–1.00)
Globulin, Total: 3.1 g/dL (ref 1.5–4.5)
Glucose: 138 mg/dL — ABNORMAL HIGH (ref 70–99)
Potassium: 3.7 mmol/L (ref 3.5–5.2)
Sodium: 137 mmol/L (ref 134–144)
Total Protein: 7.1 g/dL (ref 6.0–8.5)
eGFR: 71 mL/min/{1.73_m2} (ref >59)

## 2021-09-02 LAB — BRAIN NATRIURETIC PEPTIDE: BNP: 29.8 pg/mL (ref 0.0–100.0)

## 2021-09-05 ENCOUNTER — Other Ambulatory Visit: Payer: Self-pay

## 2021-09-08 ENCOUNTER — Other Ambulatory Visit: Payer: Self-pay

## 2021-09-11 ENCOUNTER — Other Ambulatory Visit: Payer: Self-pay | Admitting: Gerontology

## 2021-09-11 ENCOUNTER — Other Ambulatory Visit: Payer: Self-pay

## 2021-09-11 DIAGNOSIS — E1165 Type 2 diabetes mellitus with hyperglycemia: Secondary | ICD-10-CM

## 2021-09-12 ENCOUNTER — Other Ambulatory Visit: Payer: Self-pay

## 2021-09-15 ENCOUNTER — Other Ambulatory Visit: Payer: Self-pay

## 2021-09-18 ENCOUNTER — Other Ambulatory Visit: Payer: Self-pay

## 2021-09-19 ENCOUNTER — Telehealth: Payer: Self-pay | Admitting: Pharmacist

## 2021-09-19 ENCOUNTER — Other Ambulatory Visit: Payer: Self-pay

## 2021-09-19 NOTE — Telephone Encounter (Signed)
09/19/2021 12:05:12 PM - Novolog/Ozempic/Novo fine tips to pt & dr ?-- [name] - Tuesday, Sep 19, 2021 12:02 PM -- Novolog, Ozempic & Novo fine tips forms to pt for signature. Provider portion from [facility] on my desk for signature. ?

## 2021-09-25 ENCOUNTER — Ambulatory Visit
Admission: RE | Admit: 2021-09-25 | Discharge: 2021-09-25 | Disposition: A | Discharge status: Home / Self Care | Payer: Self-pay | Attending: Urology | Admitting: Urology

## 2021-09-25 ENCOUNTER — Telehealth: Payer: Self-pay | Admitting: Pharmacist

## 2021-09-25 ENCOUNTER — Ambulatory Visit
Admission: RE | Admit: 2021-09-25 | Discharge: 2021-09-25 | Disposition: A | Discharge status: Home / Self Care | Payer: Self-pay | Source: Ambulatory Visit | Attending: Urology | Admitting: Urology

## 2021-09-25 ENCOUNTER — Encounter: Payer: Self-pay | Admitting: Urology

## 2021-09-25 ENCOUNTER — Ambulatory Visit (INDEPENDENT_AMBULATORY_CARE_PROVIDER_SITE_OTHER): Payer: Self-pay | Admitting: Urology

## 2021-09-25 VITALS — BP 112/67 | HR 84 | Ht 68.0 in | Wt 276.0 lb

## 2021-09-25 DIAGNOSIS — N200 Calculus of kidney: Secondary | ICD-10-CM

## 2021-09-25 NOTE — Telephone Encounter (Signed)
09/25/2021 1:41:14 PM - Novolog/Ozempic/Novo fine tip pending ?-- Tarry Kos - Monday, Sep 25, 2021 1:39 PM --Received provider sign portion for Novolog/Ozempic/Nove fine tips. Holding for patient signed portion. ?

## 2021-09-25 NOTE — Progress Notes (Signed)
? ?09/25/2021 ?11:22 AM  ? ?[name] ?16-Jun-1975 ?[id number] ? ?Referring provider: Rolm Gala, NP ?[address] ?[address] ?[place],  [place] [address] ? ?Chief Complaint  ?Patient presents with  ? Nephrolithiasis  ? ? ?Urologic history: ?1.  Recurrent stone disease ?-Right ureteral stent placement 11/2019 for right proximal ureteral calculus with sepsis ?-Right ureteroscopy 12/29/2019 with calculus no longer present; Randall's plaques midpole calyces ?-Metabolic evaluation with hyperoxaluria, low urine pH and hyperuricosuria ?-Started potassium citrate and discussed dietary factors of increased protein and oxalate rich foods ? ?HPI: ?46 y.o. female presents for follow-up. ? ?Doing well since last visit; mild low back pain but no flank/abdominal pain ?Stable urinary frequency ?Denies dysuria, gross hematuria ?Remains on potassium citrate ? ? ?PMH: ?Past Medical History:  ?Diagnosis Date  ? Depression   ? Diabetes mellitus without complication (HCC)   ? GERD (gastroesophageal reflux disease)   ? Headache   ? History of kidney stones   ? History of nephrolithiasis 2015  ? via CT imaging, including an obstructing stone  ? Hyperlipidemia   ? Hypertension 12/2019  ? per patient, she has never been treated for high blood pressure  ? Low back pain 12/2019  ? Neuromuscular disorder (HCC)   ? neuropathy  ? ? ?Surgical History: ?Past Surgical History:  ?Procedure Laterality Date  ? CHOLECYSTECTOMY  1998  ? CYSTOSCOPY WITH STENT PLACEMENT Right 12/09/2019  ? Procedure: CYSTOSCOPY WITH STENT PLACEMENT;  Surgeon: Riki Altes, MD;  Location: [facility] ORS;  Service: Urology;  Laterality: Right;  ? CYSTOSCOPY/RETROGRADE/URETEROSCOPY  12/29/2019  ? Procedure: CYSTOSCOPY/RETROGRADE/URETEROSCOPY;  Surgeon: Riki Altes, MD;  Location: [facility] ORS;  Service: Urology;;  ? ? ?Home Medications:  ?Allergies as of 09/25/2021   ? ?   Reactions  ? Penicillins Hives  ? Raised welts  ? Topiramate   ? Other reaction(s): Other (See  Comments) ?Worsened migraines, N/V  ? Topiramate Er Nausea And Vomiting  ? Intensified Migraines  ? Valtrex [valacyclovir] Itching  ? Diarrhea, abdominal pain, and possibly itching ?Does not remember timing  ? ?  ? ?  ?Medication List  ?  ? ?  ? Accurate as of Sep 25, 2021 11:22 AM. If you have any questions, ask your nurse or doctor.  ?  ?  ? ?  ? ?Comfort EZ Pen Needles 32G X 4 MM Misc ?Generic drug: Insulin Pen Needle ?Use as directed with insulin ?  ?Novofine Pen Needle 32G X 6 MM Misc ?Generic drug: Insulin Pen Needle ?USE AS DIRECTED. ?  ?Ibuprofen 200 MG Caps ?Take 600 mg by mouth every evening. ?  ?Lantus SoloStar 100 UNIT/ML Solostar Pen ?Generic drug: insulin glargine ?Inject 80 Units into the skin once daily at bedtime. ?  ?metoprolol succinate 25 MG 24 hr tablet ?Commonly known as: Toprol XL ?Take 1 tablet (25 mg total) by mouth once daily. ?  ?NovoLOG FlexPen 100 UNIT/ML FlexPen ?Generic drug: insulin aspart ?Inject up to 26 units with breakfast according to sliding scale, then 10 units with lunch, and 10 units with dinner. ?What changed: additional instructions ?  ?Ozempic (0.25 or 0.5 MG/DOSE) 2 MG/1.5ML Sopn ?Generic drug: Semaglutide(0.25 or 0.5MG /DOS) ?Inject 0.5 mg into the skin once a week. ?  ?potassium citrate 10 MEQ (1080 MG) SR tablet ?Commonly known as: UROCIT-K ?Take 1 tablet (10 mEq total) by mouth 2 (two) times daily with a meal. ?  ?SUMAtriptan 50 MG tablet ?Commonly known as: Imitrex ?Take 1 tablet (50 mg total) by  mouth once daily for 1 dose for migraines. May repeat in 2 hours if headache persists or recurs. (Max of 2 tablets in 24 hours) ?What changed: additional instructions ?  ?torsemide 20 MG tablet ?Commonly known as: DEMADEX ?Take 1 tablet (20 mg total) by mouth once daily. ?  ? ?  ? ? ?Allergies:  ?Allergies  ?Allergen Reactions  ? Penicillins Hives  ?  Raised welts  ? Topiramate   ?  Other reaction(s): Other (See Comments) ?Worsened migraines, N/V  ? Topiramate Er Nausea And  Vomiting  ?  Intensified Migraines ?  ? Valtrex [Valacyclovir] Itching  ?  Diarrhea, abdominal pain, and possibly itching ? ?Does not remember timing  ? ? ?Family History: ?Family History  ?Problem Relation Age of Onset  ? Hypertension Mother   ? Heart disease Mother   ? Kidney failure Father   ? Sleep apnea Sister   ? Pancreatic cancer Maternal Grandmother   ? Diabetes Maternal Great-grandmother   ? ? ?Social History:  reports that she has never smoked. She has never used smokeless tobacco. She reports that she does not drink alcohol and does not use drugs. ? ? ?Physical Exam: ?LMP 03/06/2021 (Approximate)   ?Constitutional:  Alert and oriented, No acute distress. ?HEENT: [place] AT, moist mucus membranes.  Trachea midline, no masses. ?Cardiovascular: No clubbing, cyanosis, or edema. ?Respiratory: Normal respiratory effort, no increased work of breathing. ?Psychiatric: Normal mood and affect. ? ? ?Pertinent Imaging: ?KUB images were personally reviewed and interpreted ? ? ?Assessment & Plan:   ? ?1.  Nephrolithiasis ?KUB ordered today and will notify with results and further follow-up recommendations at that time ? ? ?Riki Altes, MD ? ?[facility] ?[address] ?[place], [place] [address] ?[phone] ? ?

## 2021-09-26 ENCOUNTER — Other Ambulatory Visit: Payer: Self-pay

## 2021-09-27 ENCOUNTER — Encounter: Payer: Self-pay | Admitting: *Deleted

## 2021-09-28 ENCOUNTER — Ambulatory Visit: Payer: Self-pay | Admitting: Gerontology

## 2021-09-28 ENCOUNTER — Encounter: Payer: Self-pay | Admitting: Gerontology

## 2021-09-28 VITALS — BP 127/79 | HR 86 | Temp 98.0°F | Resp 16 | Ht 68.0 in | Wt 276.0 lb

## 2021-09-28 DIAGNOSIS — R600 Localized edema: Secondary | ICD-10-CM

## 2021-09-28 DIAGNOSIS — Z794 Long term (current) use of insulin: Secondary | ICD-10-CM

## 2021-09-28 NOTE — Progress Notes (Signed)
Established Patient Office Visit  Subjective   Patient ID: [name], female    DOB: 15-Jul-1975  Age: 46 y.o. MRN: [id number]  Chief Complaint  Patient presents with   Follow-up    Labs drawn 08/31/21   Diabetes    HPI  [name] is a 46 year old female who has history of depression, type 2 diabetes with peripheral neuropathy, GERD, headache, kidney stones, hypertension presents for follow up visit. During her last visit on 08/31/21, she had edema to bilateral lower extremities, she was encouraged to continue on 20 mg Torsemide daily, wear compression stockings and elevated legs. Chemistry and BNP were within normal limits. Currently, she denies bilateral lower extremity edema, states that she's compliant with her medication, denies side effects and continues to make healthy lifestyle changes. Overall, she states that she's doing well and offers no further complaint.   Review of Systems  Constitutional: Negative.   Respiratory: Negative.    Cardiovascular: Negative.  Negative for leg swelling.  Neurological: Negative.   Psychiatric/Behavioral: Negative.       Objective:     BP 127/79 (BP Location: Right Arm, Patient Position: Sitting, Cuff Size: Large)   Pulse 86   Temp 98 F (36.7 C) (Oral)   Resp 16   Ht _0  (1.727 m)   Wt 276 lb (125.2 kg)   LMP 09/29/2018 (Approximate)   SpO2 99%   BMI 41.97 kg/m  BP Readings from Last 3 Encounters:  09/28/21 127/79  09/25/21 112/67  08/31/21 126/68   Wt Readings from Last 3 Encounters:  09/28/21 276 lb (125.2 kg)  09/25/21 276 lb (125.2 kg)  08/31/21 278 lb (126.1 kg)     Encouraged weight loss  Physical Exam HENT:     Head: Normocephalic and atraumatic.     Mouth/Throat:     Mouth: Mucous membranes are moist.  Eyes:     Extraocular Movements: Extraocular movements intact.     Conjunctiva/sclera: Conjunctivae normal.     Pupils: Pupils are equal, round, and reactive to light.  Cardiovascular:      Rate and Rhythm: Normal rate and regular rhythm.     Pulses: Normal pulses.     Heart sounds: Normal heart sounds.  Pulmonary:     Effort: Pulmonary effort is normal.     Breath sounds: Normal breath sounds.  Musculoskeletal:        General: No swelling.     Right lower leg: No edema.     Left lower leg: No edema.  Skin:    General: Skin is warm.  Neurological:     General: No focal deficit present.     Mental Status: She is alert and oriented to person, place, and time. Mental status is at baseline.  Psychiatric:        Mood and Affect: Mood normal.        Behavior: Behavior normal.        Thought Content: Thought content normal.        Judgment: Judgment normal.     No results found for any visits on 09/28/21.  Last CBC Lab Results  Component Value Date   WBC 7.5 02/15/2021   HGB 11.3 02/15/2021   HCT 37.2 02/15/2021   MCV 76 (L) 02/15/2021   MCH 23.0 (L) 02/15/2021   RDW 14.3 02/15/2021   PLT 390 73/41/9379   Last metabolic panel Lab Results  Component Value Date   GLUCOSE 138 (H) 08/31/2021   NA 137 08/31/2021  K 3.7 08/31/2021   CL 101 08/31/2021   CO2 25 08/31/2021   BUN 11 08/31/2021   CREATININE 0.98 08/31/2021   EGFR 71 08/31/2021   CALCIUM 9.6 08/31/2021   PHOS 4.3 08/31/2020   PROT 7.1 08/31/2021   ALBUMIN 4.0 08/31/2021   LABGLOB 3.1 08/31/2021   AGRATIO 1.3 08/31/2021   BILITOT 0.3 08/31/2021   ALKPHOS 99 08/31/2021   AST 9 08/31/2021   ALT 6 08/31/2021   ANIONGAP 8 08/09/2020   Last lipids Lab Results  Component Value Date   CHOL 142 11/11/2019   HDL 32 (L) 11/11/2019   LDLCALC 94 11/11/2019   TRIG 84 11/11/2019   CHOLHDL 4.4 11/11/2019   Last hemoglobin A1c Lab Results  Component Value Date   HGBA1C 8.8 (H) 08/23/2021   Last thyroid functions Lab Results  Component Value Date   TSH 1.990 01/27/2021   T4TOTAL 5.9 10/27/2015      The 10-year ASCVD risk score (Arnett DK, et al., 2019) is: 11.3%    Assessment & Plan:     1. Bilateral edema of lower extremity - Edema has resolved, will continue Torsemide, compression stockings and  advised to schedule her  follow up appointment with Cardiologist.  - Urinalysis; Future  2. Type 2 diabetes mellitus with diabetic polyneuropathy, with long-term current use of insulin (HCC) - Urine Microalbumin w/creat. ratio; Future will be rechecked.   Return in about 2 months (around 11/29/2021), or if symptoms worsen or fail to improve.    Shade Kaley Jerold Coombe, NP

## 2021-09-28 NOTE — Patient Instructions (Signed)
Schedule appointment with  Cardiologist

## 2021-10-04 ENCOUNTER — Other Ambulatory Visit: Payer: Self-pay

## 2021-10-04 MED ORDER — OZEMPIC (0.25 OR 0.5 MG/DOSE) 2 MG/3ML ~~LOC~~ SOPN
PEN_INJECTOR | SUBCUTANEOUS | 0 refills | Status: DC
Start: 2021-08-01 — End: 2021-10-18
  Filled 2021-10-04: qty 4.5, 84d supply, fill #0

## 2021-10-06 ENCOUNTER — Telehealth: Payer: Self-pay | Admitting: Pharmacy Technician

## 2021-10-06 NOTE — Telephone Encounter (Signed)
Received updated proof of income.  Patient eligible to receive medication assistance at [facility] until time for re-certification in 2024, and as long as eligibility requirements continue to be met.  Jazzmin Newbold J. Deeksha Cotrell Care Manager [facility]  

## 2021-10-11 ENCOUNTER — Other Ambulatory Visit: Payer: Self-pay

## 2021-10-16 ENCOUNTER — Other Ambulatory Visit: Payer: Self-pay

## 2021-10-18 ENCOUNTER — Other Ambulatory Visit: Payer: Self-pay

## 2021-10-18 MED ORDER — OZEMPIC (0.25 OR 0.5 MG/DOSE) 2 MG/3ML ~~LOC~~ SOPN
PEN_INJECTOR | SUBCUTANEOUS | 3 refills | Status: DC
  Filled 2022-01-04: qty 21, 196d supply, fill #0
  Filled 2022-01-23 – 2022-04-20 (×2): qty 21, 196d supply, fill #1
  Filled 2022-04-23: qty 12, 112d supply, fill #1
  Filled 2022-06-28 – 2022-07-24 (×3): qty 12, 112d supply, fill #2
  Filled 2022-08-08: qty 3, 28d supply, fill #2

## 2021-10-18 MED ORDER — INSULIN ASPART 100 UNIT/ML FLEXPEN: PEN_INJECTOR | SUBCUTANEOUS | 3 refills | Status: DC

## 2021-10-18 MED ORDER — NOVOFINE PEN NEEDLE 32G X 6 MM MISC: 3 refills | Status: DC

## 2021-10-24 ENCOUNTER — Other Ambulatory Visit: Payer: Self-pay

## 2021-10-26 ENCOUNTER — Other Ambulatory Visit: Payer: Self-pay

## 2021-10-30 ENCOUNTER — Other Ambulatory Visit: Payer: Self-pay

## 2021-11-15 ENCOUNTER — Other Ambulatory Visit: Payer: Self-pay

## 2021-11-16 ENCOUNTER — Other Ambulatory Visit: Payer: Self-pay

## 2021-11-17 ENCOUNTER — Other Ambulatory Visit: Payer: Self-pay

## 2021-11-22 ENCOUNTER — Other Ambulatory Visit: Payer: Self-pay

## 2021-11-23 ENCOUNTER — Ambulatory Visit: Payer: Self-pay | Admitting: Gerontology

## 2021-11-23 ENCOUNTER — Other Ambulatory Visit: Payer: Self-pay

## 2021-11-28 ENCOUNTER — Ambulatory Visit: Payer: Self-pay | Admitting: Endocrinology

## 2021-11-28 ENCOUNTER — Other Ambulatory Visit: Payer: Self-pay

## 2021-11-28 ENCOUNTER — Ambulatory Visit: Payer: Self-pay | Admitting: Gerontology

## 2021-11-28 ENCOUNTER — Encounter: Payer: Self-pay | Admitting: Gerontology

## 2021-11-28 VITALS — BP 128/72 | HR 81 | Temp 98.2°F | Resp 16 | Ht 68.0 in | Wt 286.6 lb

## 2021-11-28 VITALS — BP 114/57 | HR 81 | Temp 96.0°F | Wt 284.2 lb

## 2021-11-28 DIAGNOSIS — E1142 Type 2 diabetes mellitus with diabetic polyneuropathy: Secondary | ICD-10-CM

## 2021-11-28 DIAGNOSIS — E1169 Type 2 diabetes mellitus with other specified complication: Secondary | ICD-10-CM

## 2021-11-28 DIAGNOSIS — I10 Essential (primary) hypertension: Secondary | ICD-10-CM

## 2021-11-28 DIAGNOSIS — R600 Localized edema: Secondary | ICD-10-CM

## 2021-11-28 DIAGNOSIS — E1165 Type 2 diabetes mellitus with hyperglycemia: Secondary | ICD-10-CM

## 2021-11-28 LAB — POCT GLYCOSYLATED HEMOGLOBIN (HGB A1C): Hemoglobin A1C: 7.3 % — AB (ref 4.0–5.6)

## 2021-11-28 LAB — GLUCOSE, POCT (MANUAL RESULT ENTRY): POC Glucose: 186 mg/dl — AB (ref 70–99)

## 2021-11-28 MED ORDER — INSULIN ASPART 100 UNIT/ML FLEXPEN
PEN_INJECTOR | SUBCUTANEOUS | 3 refills | Status: DC
  Filled 2021-11-28: qty 60, 90d supply, fill #0
  Filled 2021-12-07: qty 60, fill #0
  Filled 2021-12-08: qty 15, 30d supply, fill #0

## 2021-11-28 MED ORDER — METOPROLOL SUCCINATE ER 25 MG PO TB24
25.0000 mg | ORAL_TABLET | Freq: Every day | ORAL | 1 refills | Status: DC
  Filled 2021-11-28 (×2): qty 87, 87d supply, fill #0
  Filled 2021-11-28: qty 3, 3d supply, fill #0
  Filled 2021-11-28: qty 90, 90d supply, fill #0
  Filled 2022-03-17: qty 90, 90d supply, fill #1

## 2021-11-28 MED ORDER — NOVOFINE PEN NEEDLE 32G X 6 MM MISC
3 refills | Status: DC
  Filled 2021-11-28: qty 600, 120d supply, fill #0
  Filled 2022-03-17: qty 400, 100d supply, fill #1
  Filled 2022-04-20 – 2022-06-28 (×2): qty 400, 100d supply, fill #2
  Filled 2022-09-24: qty 100, 30d supply, fill #2

## 2021-11-28 MED ORDER — OZEMPIC (1 MG/DOSE) 4 MG/3ML ~~LOC~~ SOPN
1.0000 mg | PEN_INJECTOR | SUBCUTANEOUS | 11 refills | Status: DC
  Filled 2021-11-28: qty 15, 140d supply, fill #0
  Filled 2021-12-07: qty 3, 28d supply, fill #0

## 2021-11-28 MED ORDER — LANTUS SOLOSTAR 100 UNIT/ML ~~LOC~~ SOPN
80.0000 [IU] | PEN_INJECTOR | Freq: Every day | SUBCUTANEOUS | 5 refills | Status: DC
  Filled 2021-11-28: qty 75, 93d supply, fill #0
  Filled 2022-03-17 – 2022-03-21 (×2): qty 75, 93d supply, fill #1
  Filled 2022-04-20 – 2022-06-28 (×3): qty 75, 93d supply, fill #2
  Filled 2022-09-24: qty 75, 93d supply, fill #3

## 2021-11-28 NOTE — Patient Instructions (Addendum)
It was so nice to see you today!  Here are the instructions today: You will increase 1mg  of Ozempic, once a week.  A common side effect of Ozempic is getting full easily. Please eat a small cup of food for meals and see if you are hungry. If you are hungry still, you can still eat another small cup. If you see blood sugars are below 100mg /dl, cut back 10 units on your Lantus until you see your blood sugar stopping going below 100mg /dl. If your blood sugar starts to increase over 150mg /dl, then increase by 2 units until the blood sugars are not over 150mg /dl. We will schedule a Opthalmology appointment We will see you in 3 months.

## 2021-11-28 NOTE — Patient Instructions (Signed)
DASH Eating Plan DASH stands for Dietary Approaches to Stop Hypertension. The DASH eating plan is a healthy eating plan that has been shown to: Reduce high blood pressure (hypertension). Reduce your risk for type 2 diabetes, heart disease, and stroke. Help with weight loss. What are tips for following this plan? Reading food labels Check food labels for the amount of salt (sodium) per serving. Choose foods with less than 5 percent of the Daily Value of sodium. Generally, foods with less than 300 milligrams (mg) of sodium per serving fit into this eating plan. To find whole grains, look for the word "whole" as the first word in the ingredient list. Shopping Buy products labeled as "low-sodium" or "no salt added." Buy fresh foods. Avoid canned foods and pre-made or frozen meals. Cooking Avoid adding salt when cooking. Use salt-free seasonings or herbs instead of table salt or sea salt. Check with your health care provider or pharmacist before using salt substitutes. Do not fry foods. Cook foods using healthy methods such as baking, boiling, grilling, roasting, and broiling instead. Cook with heart-healthy oils, such as olive, canola, avocado, soybean, or sunflower oil. Meal planning  Eat a balanced diet that includes: 4 or more servings of fruits and 4 or more servings of vegetables each day. Try to fill one-half of your plate with fruits and vegetables. 6-8 servings of whole grains each day. Less than 6 oz (170 g) of lean meat, poultry, or fish each day. A 3-oz (85-g) serving of meat is about the same size as a deck of cards. One egg equals 1 oz (28 g). 2-3 servings of low-fat dairy each day. One serving is 1 cup (237 mL). 1 serving of nuts, seeds, or beans 5 times each week. 2-3 servings of heart-healthy fats. Healthy fats called omega-3 fatty acids are found in foods such as walnuts, flaxseeds, fortified milks, and eggs. These fats are also found in cold-water fish, such as sardines, salmon,  and mackerel. Limit how much you eat of: Canned or prepackaged foods. Food that is high in trans fat, such as some fried foods. Food that is high in saturated fat, such as fatty meat. Desserts and other sweets, sugary drinks, and other foods with added sugar. Full-fat dairy products. Do not salt foods before eating. Do not eat more than 4 egg yolks a week. Try to eat at least 2 vegetarian meals a week. Eat more home-cooked food and less restaurant, buffet, and fast food. Lifestyle When eating at a restaurant, ask that your food be prepared with less salt or no salt, if possible. If you drink alcohol: Limit how much you use to: 0-1 drink a day for women who are not pregnant. 0-2 drinks a day for men. Be aware of how much alcohol is in your drink. In the [place] one drink equals one 12 oz bottle of beer (355 mL), one 5 oz glass of wine (148 mL), or one 1 oz glass of hard liquor (44 mL). General information Avoid eating more than 2,300 mg of salt a day. If you have hypertension, you may need to reduce your sodium intake to 1,500 mg a day. Work with your health care provider to maintain a healthy body weight or to lose weight. Ask what an ideal weight is for you. Get at least 30 minutes of exercise that causes your heart to beat faster (aerobic exercise) most days of the week. Activities may include walking, swimming, or biking. Work with your health care provider or dietitian to   adjust your eating plan to your individual calorie needs. What foods should I eat? Fruits All fresh, dried, or frozen fruit. Canned fruit in natural juice (without added sugar). Vegetables Fresh or frozen vegetables (raw, steamed, roasted, or grilled). Low-sodium or reduced-sodium tomato and vegetable juice. Low-sodium or reduced-sodium tomato sauce and tomato paste. Low-sodium or reduced-sodium canned vegetables. Grains Whole-grain or whole-wheat bread. Whole-grain or whole-wheat pasta. Brown rice. Oatmeal. Quinoa.  Bulgur. Whole-grain and low-sodium cereals. Pita bread. Low-fat, low-sodium crackers. Whole-wheat flour tortillas. Meats and other proteins Skinless chicken or turkey. Ground chicken or turkey. Pork with fat trimmed off. Fish and seafood. Egg whites. Dried beans, peas, or lentils. Unsalted nuts, nut butters, and seeds. Unsalted canned beans. Lean cuts of beef with fat trimmed off. Low-sodium, lean precooked or cured meat, such as sausages or meat loaves. Dairy Low-fat (1%) or fat-free (skim) milk. Reduced-fat, low-fat, or fat-free cheeses. Nonfat, low-sodium ricotta or cottage cheese. Low-fat or nonfat yogurt. Low-fat, low-sodium cheese. Fats and oils Soft margarine without trans fats. Vegetable oil. Reduced-fat, low-fat, or light mayonnaise and salad dressings (reduced-sodium). Canola, safflower, olive, avocado, soybean, and sunflower oils. Avocado. Seasonings and condiments Herbs. Spices. Seasoning mixes without salt. Other foods Unsalted popcorn and pretzels. Fat-free sweets. The items listed above may not be a complete list of foods and beverages you can eat. Contact a dietitian for more information. What foods should I avoid? Fruits Canned fruit in a light or heavy syrup. Fried fruit. Fruit in cream or butter sauce. Vegetables Creamed or fried vegetables. Vegetables in a cheese sauce. Regular canned vegetables (not low-sodium or reduced-sodium). Regular canned tomato sauce and paste (not low-sodium or reduced-sodium). Regular tomato and vegetable juice (not low-sodium or reduced-sodium). Pickles. Olives. Grains Baked goods made with fat, such as croissants, muffins, or some breads. Dry pasta or rice meal packs. Meats and other proteins Fatty cuts of meat. Ribs. Fried meat. Bacon. Bologna, salami, and other precooked or cured meats, such as sausages or meat loaves. Fat from the back of a pig (fatback). Bratwurst. Salted nuts and seeds. Canned beans with added salt. Canned or smoked fish.  Whole eggs or egg yolks. Chicken or turkey with skin. Dairy Whole or 2% milk, cream, and half-and-half. Whole or full-fat cream cheese. Whole-fat or sweetened yogurt. Full-fat cheese. Nondairy creamers. Whipped toppings. Processed cheese and cheese spreads. Fats and oils Butter. Stick margarine. Lard. Shortening. Ghee. Bacon fat. Tropical oils, such as coconut, palm kernel, or palm oil. Seasonings and condiments Onion salt, garlic salt, seasoned salt, table salt, and sea salt. Worcestershire sauce. Tartar sauce. Barbecue sauce. Teriyaki sauce. Soy sauce, including reduced-sodium. Steak sauce. Canned and packaged gravies. Fish sauce. Oyster sauce. Cocktail sauce. Store-bought horseradish. Ketchup. Mustard. Meat flavorings and tenderizers. Bouillon cubes. Hot sauces. Pre-made or packaged marinades. Pre-made or packaged taco seasonings. Relishes. Regular salad dressings. Other foods Salted popcorn and pretzels. The items listed above may not be a complete list of foods and beverages you should avoid. Contact a dietitian for more information. Where to find more information National Heart, Lung, and Blood Institute: www.nhlbi.nih.gov American Heart Association: www.heart.org Academy of Nutrition and Dietetics: www.eatright.org National Kidney Foundation: www.kidney.org Summary The DASH eating plan is a healthy eating plan that has been shown to reduce high blood pressure (hypertension). It may also reduce your risk for type 2 diabetes, heart disease, and stroke. When on the DASH eating plan, aim to eat more fresh fruits and vegetables, whole grains, lean proteins, low-fat dairy, and heart-healthy fats. With the DASH   eating plan, you should limit salt (sodium) intake to 2,300 mg a day. If you have hypertension, you may need to reduce your sodium intake to 1,500 mg a day. Work with your health care provider or dietitian to adjust your eating plan to your individual calorie needs. This information is not  intended to replace advice given to you by your health care provider. Make sure you discuss any questions you have with your health care provider. Document Revised: 04/03/2019 Document Reviewed: 04/03/2019 Elsevier Patient Education  2023 Elsevier Inc. Carbohydrate Counting for Diabetes Mellitus, Adult Carbohydrate counting is a method of keeping track of how many carbohydrates you eat. Eating carbohydrates increases the amount of sugar (glucose) in the blood. Counting how many carbohydrates you eat improves how well you manage your blood glucose. This, in turn, helps you manage your diabetes. Carbohydrates are measured in grams (g) per serving. It is important to know how many carbohydrates (in grams or by serving size) you can have in each meal. This is different for every person. A dietitian can help you make a meal plan and calculate how many carbohydrates you should have at each meal and snack. What foods contain carbohydrates? Carbohydrates are found in the following foods: Grains, such as breads and cereals. Dried beans and soy products. Starchy vegetables, such as potatoes, peas, and corn. Fruit and fruit juices. Milk and yogurt. Sweets and snack foods, such as cake, cookies, candy, chips, and soft drinks. How do I count carbohydrates in foods? There are two ways to count carbohydrates in food. You can read food labels or learn standard serving sizes of foods. You can use either of these methods or a combination of both. Using the Nutrition Facts label The Nutrition Facts list is included on the labels of almost all packaged foods and beverages in the [place]. It includes: The serving size. Information about nutrients in each serving, including the grams of carbohydrate per serving. To use the Nutrition Facts, decide how many servings you will have. Then, multiply the number of servings by the number of carbohydrates per serving. The resulting number is the total grams of  carbohydrates that you will be having. Learning the standard serving sizes of foods When you eat carbohydrate foods that are not packaged or do not include Nutrition Facts on the label, you need to measure the servings in order to count the grams of carbohydrates. Measure the foods that you will eat with a food scale or measuring cup, if needed. Decide how many standard-size servings you will eat. Multiply the number of servings by 15. For foods that contain carbohydrates, one serving equals 15 g of carbohydrates. For example, if you eat 2 cups or 10 oz (300 g) of strawberries, you will have eaten 2 servings and 30 g of carbohydrates (2 servings x 15 g = 30 g). For foods that have more than one food mixed, such as soups and casseroles, you must count the carbohydrates in each food that is included. The following list contains standard serving sizes of common carbohydrate-rich foods. Each of these servings has about 15 g of carbohydrates: 1 slice of bread. 1 six-inch (15 cm) tortilla. ? cup or 2 oz (53 g) cooked rice or pasta.  cup or 3 oz (85 g) cooked or canned, drained and rinsed beans or lentils.  cup or 3 oz (85 g) starchy vegetable, such as peas, corn, or squash.  cup or 4 oz (120 g) hot cereal.  cup or 3 oz (85   g) boiled or mashed potatoes, or  or 3 oz (85 g) of a large baked potato.  cup or 4 fl oz (118 mL) fruit juice. 1 cup or 8 fl oz (237 mL) milk. 1 small or 4 oz (106 g) apple.  or 2 oz (63 g) of a medium banana. 1 cup or 5 oz (150 g) strawberries. 3 cups or 1 oz (28.3 g) popped popcorn. What is an example of carbohydrate counting? To calculate the grams of carbohydrates in this sample meal, follow the steps shown below. Sample meal 3 oz (85 g) chicken breast. ? cup or 4 oz (106 g) brown rice.  cup or 3 oz (85 g) corn. 1 cup or 8 fl oz (237 mL) milk. 1 cup or 5 oz (150 g) strawberries with sugar-free whipped topping. Carbohydrate calculation Identify the foods that  contain carbohydrates: Rice. Corn. Milk. Strawberries. Calculate how many servings you have of each food: 2 servings rice. 1 serving corn. 1 serving milk. 1 serving strawberries. Multiply each number of servings by 15 g: 2 servings rice x 15 g = 30 g. 1 serving corn x 15 g = 15 g. 1 serving milk x 15 g = 15 g. 1 serving strawberries x 15 g = 15 g. Add together all of the amounts to find the total grams of carbohydrates eaten: 30 g + 15 g + 15 g + 15 g = 75 g of carbohydrates total. What are tips for following this plan? Shopping Develop a meal plan and then make a shopping list. Buy fresh and frozen vegetables, fresh and frozen fruit, dairy, eggs, beans, lentils, and whole grains. Look at food labels. Choose foods that have more fiber and less sugar. Avoid processed foods and foods with added sugars. Meal planning Aim to have the same number of grams of carbohydrates at each meal and for each snack time. Plan to have regular, balanced meals and snacks. Where to find more information American Diabetes Association: diabetes.org Centers for Disease Control and Prevention: cdc.gov Academy of Nutrition and Dietetics: eatright.org Association of Diabetes Care & Education Specialists: diabeteseducator.org Summary Carbohydrate counting is a method of keeping track of how many carbohydrates you eat. Eating carbohydrates increases the amount of sugar (glucose) in your blood. Counting how many carbohydrates you eat improves how well you manage your blood glucose. This helps you manage your diabetes. A dietitian can help you make a meal plan and calculate how many carbohydrates you should have at each meal and snack. This information is not intended to replace advice given to you by your health care provider. Make sure you discuss any questions you have with your health care provider. Document Revised: 12/02/2019 Document Reviewed: 12/02/2019 Elsevier Patient Education  2023 Elsevier  Inc.  

## 2021-11-28 NOTE — Progress Notes (Unsigned)
Follow up Diabetes/ [facility]     Patient ID: [name], female   DOB: 12/23/1975, 46 y.o.   MRN: [id number] Assessment:  [name] is a 46 y.o. female who is seen in follow up for Type 2 diabetes mellitus with hyperglycemia, with long-term current use of insulin (HCC) [E11.65, Z79.4] at the request of [name], [name], NP.  Encounter Diagnoses 1. Type 2 diabetes mellitus with hyperglycemia, with long-term current use of insulin (HCC)   2. Essential hypertension   3. Hyperlipidemia associated with type 2 diabetes mellitus (HCC)     Assessment    Plan:         Patient Instructions  It was so nice to see you today!  Here are the instructions today: You will increase 1mg  of Ozempic, once a week.  A common side effect of Ozempic is getting full easily. Please eat a small cup of food for meals and see if you are hungry. If you are hungry still, you can still eat another small cup. If you see blood sugars are below 100mg /dl, cut back 10 units on your Lantus until you see your blood sugar stopping going below 100mg /dl. If your blood sugar starts to increase over 150mg /dl, then increase by 2 units until the blood sugars are not over 150mg /dl. We will schedule a Opthalmology appointment We will see you in 3 months.    No orders of the defined types were placed in this encounter.    Subjective:  HPI   Review of Systems  [name]  has a past medical history of Depression, Diabetes mellitus without complication (HCC), GERD (gastroesophageal reflux disease), Headache, History of kidney stones, History of nephrolithiasis (2015), Hyperlipidemia, Hypertension (12/2019), Low back pain (12/2019), and Neuromuscular disorder (HCC).  Family History, Social History, current Medications and allergies reviewed and updated in Epic.  Objective:    Last menstrual period 09/29/2018. Physical Exam      Data : I have personally reviewed  pertinent labs and imaging studies, if indicated,  with the patient in clinic today.   Lab Orders  No laboratory test(s) ordered today    HC Readings from Last 3 Encounters:  No data found for [facility]    Wt Readings from Last 3 Encounters:  11/28/21 286 lb 9.6 oz (130 kg)  09/28/21 276 lb (125.2 kg)  09/25/21 276 lb (125.2 kg)

## 2021-11-28 NOTE — Progress Notes (Unsigned)
Established Patient Office Visit  Subjective   Patient ID: [name], female    DOB: 02-Feb-1976  Age: 46 y.o. MRN: [id number]  Chief Complaint  Patient presents with   Follow-up   Diabetes    HPI  [name] is a 46 year old female who has history of depression, type 2 diabetes with peripheral neuropathy, GERD, headache, kidney stones, hypertension presents for follow up visit. Her HgbA1c checked during visit decreased from 8.8 % to 7.3%.Checks blood glucose bid, fasting reading was 159 mg/dl. She reports taking 80 units of Lantus at night. She reports taking 14 units of Aspart only in the morning instead of 26 units in the morning and 10 units with lunch and dinner.She continues to experience edema to bilateral  ROS    Objective:     BP 128/72 (BP Location: Right Arm, Patient Position: Sitting, Cuff Size: Large)   Pulse 81   Temp 98.2 F (36.8 C) (Oral)   Resp 16   Ht 5\' 8"  (1.727 m)   Wt 286 lb 9.6 oz (130 kg)   LMP 09/29/2018 (Approximate)   SpO2 97%   BMI 43.58 kg/m  BP Readings from Last 3 Encounters:  11/28/21 128/72  09/28/21 127/79  09/25/21 112/67   Wt Readings from Last 3 Encounters:  11/28/21 286 lb 9.6 oz (130 kg)  09/28/21 276 lb (125.2 kg)  09/25/21 276 lb (125.2 kg)      Physical Exam   Results for orders placed or performed in visit on 11/28/21  POCT Glucose (CBG)  Result Value Ref Range   POC Glucose 186 (A) 70 - 99 mg/dl  POCT HgB 11/30/21  Result Value Ref Range   Hemoglobin A1C 7.3 (A) 4.0 - 5.6 %   HbA1c POC (<> result, manual entry)     HbA1c, POC (prediabetic range)     HbA1c, POC (controlled diabetic range)      Lab Results  Component Value Date   WBC 7.5 02/15/2021   HGB 11.3 02/15/2021   HCT 37.2 02/15/2021   MCV 76 (L) 02/15/2021   MCH 23.0 (L) 02/15/2021   RDW 14.3 02/15/2021   PLT 390 02/15/2021   Last metabolic panel Lab Results  Component Value Date   GLUCOSE 138 (H) 08/31/2021   NA 137  08/31/2021   K 3.7 08/31/2021   CL 101 08/31/2021   CO2 25 08/31/2021   BUN 11 08/31/2021   CREATININE 0.98 08/31/2021   EGFR 71 08/31/2021   CALCIUM 9.6 08/31/2021   PHOS 4.3 08/31/2020   PROT 7.1 08/31/2021   ALBUMIN 4.0 08/31/2021   LABGLOB 3.1 08/31/2021   AGRATIO 1.3 08/31/2021   BILITOT 0.3 08/31/2021   ALKPHOS 99 08/31/2021   AST 9 08/31/2021   ALT 6 08/31/2021   ANIONGAP 8 08/09/2020   Last lipids Lab Results  Component Value Date   CHOL 142 11/11/2019   HDL 32 (L) 11/11/2019   LDLCALC 94 11/11/2019   TRIG 84 11/11/2019   CHOLHDL 4.4 11/11/2019   Last hemoglobin A1c Lab Results  Component Value Date   HGBA1C 7.3 (A) 11/28/2021      The 10-year ASCVD risk score (Arnett DK, et al., 2019) is: 11.6%    Assessment & Plan:   Problem List Items Addressed This Visit       Cardiovascular and Mediastinum   Essential hypertension   Relevant Medications   metoprolol succinate (TOPROL XL) 25 MG 24 hr tablet     Other  Bilateral edema of lower extremity   Relevant Orders   Comp Met (CMET)   Other Visit Diagnoses     Type 2 diabetes mellitus with diabetic polyneuropathy, with long-term current use of insulin ([place])    -  Primary   Relevant Orders   POCT HgB A1C (Completed)   POCT Glucose (CBG) (Completed)       No follow-ups on file.    Hannah Bostock Jerold Coombe, NP

## 2021-11-29 ENCOUNTER — Encounter: Payer: Self-pay | Admitting: Endocrinology

## 2021-11-29 ENCOUNTER — Other Ambulatory Visit: Payer: Self-pay

## 2021-11-29 LAB — LIPID PANEL
Chol/HDL Ratio: 5 ratio — ABNORMAL HIGH (ref 0.0–4.4)
Cholesterol, Total: 179 mg/dL (ref 100–199)
HDL: 36 mg/dL — ABNORMAL LOW (ref >39)
LDL Chol Calc (NIH): 108 mg/dL — ABNORMAL HIGH (ref 0–99)
Triglycerides: 202 mg/dL — ABNORMAL HIGH (ref 0–149)
VLDL Cholesterol Cal: 35 mg/dL (ref 5–40)

## 2021-11-29 LAB — URINALYSIS
Bilirubin, UA: NEGATIVE
Glucose, UA: NEGATIVE
Ketones, UA: NEGATIVE
Nitrite, UA: NEGATIVE
RBC, UA: NEGATIVE
Specific Gravity, UA: 1.02 (ref 1.005–1.030)
Urobilinogen, Ur: 0.2 mg/dL (ref 0.2–1.0)
pH, UA: 5.5 (ref 5.0–7.5)

## 2021-11-29 LAB — COMPREHENSIVE METABOLIC PANEL
ALT: 9 IU/L (ref 0–32)
AST: 11 IU/L (ref 0–40)
Albumin/Globulin Ratio: 1.4 (ref 1.2–2.2)
Albumin: 4.4 g/dL (ref 3.9–4.9)
Alkaline Phosphatase: 132 IU/L — ABNORMAL HIGH (ref 44–121)
BUN/Creatinine Ratio: 11 (ref 9–23)
BUN: 12 mg/dL (ref 6–24)
Bilirubin Total: 0.2 mg/dL (ref 0.0–1.2)
CO2: 25 mmol/L (ref 20–29)
Calcium: 9.4 mg/dL (ref 8.7–10.2)
Chloride: 102 mmol/L (ref 96–106)
Creatinine, Ser: 1.05 mg/dL — ABNORMAL HIGH (ref 0.57–1.00)
Globulin, Total: 3.1 g/dL (ref 1.5–4.5)
Glucose: 228 mg/dL — ABNORMAL HIGH (ref 70–99)
Potassium: 4 mmol/L (ref 3.5–5.2)
Sodium: 139 mmol/L (ref 134–144)
Total Protein: 7.5 g/dL (ref 6.0–8.5)
eGFR: 66 mL/min/{1.73_m2} (ref >59)

## 2021-11-29 LAB — MICROALBUMIN / CREATININE URINE RATIO
Creatinine, Urine: 168.4 mg/dL
Microalb/Creat Ratio: 57 mg/g creat — ABNORMAL HIGH (ref 0–29)
Microalbumin, Urine: 95.4 ug/mL

## 2021-11-29 NOTE — Progress Notes (Signed)
See medical student note

## 2021-12-07 ENCOUNTER — Other Ambulatory Visit: Payer: Self-pay

## 2021-12-08 ENCOUNTER — Other Ambulatory Visit: Payer: Self-pay

## 2021-12-09 ENCOUNTER — Other Ambulatory Visit: Payer: Self-pay

## 2021-12-11 ENCOUNTER — Other Ambulatory Visit: Payer: Self-pay

## 2021-12-12 ENCOUNTER — Other Ambulatory Visit: Payer: Self-pay

## 2021-12-12 ENCOUNTER — Ambulatory Visit: Payer: Self-pay | Admitting: Gerontology

## 2021-12-12 MED ORDER — TORSEMIDE 20 MG PO TABS
20.0000 mg | ORAL_TABLET | Freq: Every day | ORAL | 0 refills | Status: DC
Start: 2021-12-12 — End: 2022-01-02
  Filled 2021-12-12: qty 30, 30d supply, fill #0

## 2021-12-14 ENCOUNTER — Other Ambulatory Visit: Payer: Self-pay

## 2021-12-15 ENCOUNTER — Other Ambulatory Visit: Payer: Self-pay

## 2021-12-18 ENCOUNTER — Ambulatory Visit: Payer: Self-pay | Admitting: Cardiology

## 2021-12-21 ENCOUNTER — Other Ambulatory Visit: Payer: Self-pay

## 2021-12-22 ENCOUNTER — Other Ambulatory Visit: Payer: Self-pay

## 2022-01-01 ENCOUNTER — Other Ambulatory Visit: Payer: Self-pay

## 2022-01-02 ENCOUNTER — Emergency Department: Payer: Self-pay

## 2022-01-02 ENCOUNTER — Ambulatory Visit: Payer: Self-pay

## 2022-01-02 ENCOUNTER — Encounter: Payer: Self-pay | Admitting: Emergency Medicine

## 2022-01-02 ENCOUNTER — Emergency Department
Admission: EM | Admit: 2022-01-02 | Discharge: 2022-01-02 | Disposition: A | Discharge status: Home / Self Care | Payer: Self-pay | Attending: Emergency Medicine | Admitting: Emergency Medicine

## 2022-01-02 ENCOUNTER — Other Ambulatory Visit: Payer: Self-pay

## 2022-01-02 DIAGNOSIS — I10 Essential (primary) hypertension: Secondary | ICD-10-CM | POA: Insufficient documentation

## 2022-01-02 DIAGNOSIS — R609 Edema, unspecified: Secondary | ICD-10-CM

## 2022-01-02 DIAGNOSIS — E119 Type 2 diabetes mellitus without complications: Secondary | ICD-10-CM | POA: Insufficient documentation

## 2022-01-02 DIAGNOSIS — R079 Chest pain, unspecified: Secondary | ICD-10-CM | POA: Insufficient documentation

## 2022-01-02 DIAGNOSIS — I872 Venous insufficiency (chronic) (peripheral): Secondary | ICD-10-CM | POA: Insufficient documentation

## 2022-01-02 LAB — TROPONIN I (HIGH SENSITIVITY): Troponin I (High Sensitivity): 4 ng/L (ref <18)

## 2022-01-02 LAB — HEPATIC FUNCTION PANEL
ALT: 12 U/L (ref 0–44)
AST: 18 U/L (ref 15–41)
Albumin: 3.7 g/dL (ref 3.5–5.0)
Alkaline Phosphatase: 85 U/L (ref 38–126)
Bilirubin, Direct: <0.1 mg/dL (ref 0.0–0.2)
Total Bilirubin: 0.7 mg/dL (ref 0.3–1.2)
Total Protein: 7.5 g/dL (ref 6.5–8.1)

## 2022-01-02 LAB — CBC
HCT: 31.4 % — ABNORMAL LOW (ref 36.0–46.0)
Hemoglobin: 9.9 g/dL — ABNORMAL LOW (ref 12.0–15.0)
MCH: 23.1 pg — ABNORMAL LOW (ref 26.0–34.0)
MCHC: 31.5 g/dL (ref 30.0–36.0)
MCV: 73.4 fL — ABNORMAL LOW (ref 80.0–100.0)
Platelets: 339 10*3/uL (ref 150–400)
RBC: 4.28 MIL/uL (ref 3.87–5.11)
RDW: 15.9 % — ABNORMAL HIGH (ref 11.5–15.5)
WBC: 7.7 10*3/uL (ref 4.0–10.5)
nRBC: 0 % (ref 0.0–0.2)

## 2022-01-02 LAB — BRAIN NATRIURETIC PEPTIDE: B Natriuretic Peptide: 30 pg/mL (ref 0.0–100.0)

## 2022-01-02 LAB — BASIC METABOLIC PANEL
Anion gap: 10 (ref 5–15)
BUN: 10 mg/dL (ref 6–20)
CO2: 25 mmol/L (ref 22–32)
Calcium: 9.2 mg/dL (ref 8.9–10.3)
Chloride: 105 mmol/L (ref 98–111)
Creatinine, Ser: 0.77 mg/dL (ref 0.44–1.00)
GFR, Estimated: >60 mL/min (ref >60)
Glucose, Bld: 141 mg/dL — ABNORMAL HIGH (ref 70–99)
Potassium: 3.7 mmol/L (ref 3.5–5.1)
Sodium: 140 mmol/L (ref 135–145)

## 2022-01-02 LAB — HCG, QUANTITATIVE, PREGNANCY: hCG, Beta Chain, Quant, S: 1 m[IU]/mL (ref <5)

## 2022-01-02 MED ORDER — FUROSEMIDE 20 MG PO TABS
20.0000 mg | ORAL_TABLET | Freq: Every day | ORAL | 0 refills | Status: DC
  Filled 2022-01-02: qty 14, 14d supply, fill #0

## 2022-01-02 NOTE — ED Provider Notes (Signed)
[facility] Provider Note    Event Date/Time   First MD Initiated Contact with Patient 01/02/22 1635     (approximate)   History   Chief Complaint Chest Pain   HPI  [name] is a 46 y.o. female with past medical history of hypertension, hyperlipidemia, diabetes, and anemia presents to the ED complaining of leg swelling and chest pain.  Patient reports that she has been dealing with off-and-on leg swelling for multiple months, however it is seem to be worse over the past week.  She states that she has taken torsemide for it in the past with relief, but feels like over the past week the torsemide has been making it worse.  She states her legs feel "like they are about to burst" and are particularly painful for her at night.  She also states that she has been dealing with intermittent pain in her chest for the past couple of days.  She describes the pain as a dull ache that we will start on 1 side of her chest and moved to the other can start on both the right and left side of her chest.  She denies any chest pain currently and has not had any fevers, cough, or shortness of breath.  She denies any recent surgeries and has never had DVT or PE.     Physical Exam   Triage Vital Signs: ED Triage Vitals  Enc Vitals Group     BP 01/02/22 1403 130/68     Pulse Rate 01/02/22 1403 71     Resp 01/02/22 1403 20     Temp 01/02/22 1403 98.5 F (36.9 C)     Temp Source 01/02/22 1403 Oral     SpO2 01/02/22 1403 96 %     Weight 01/02/22 1357 283 lb (128.4 kg)     Height 01/02/22 1357 5\' 8"  (1.727 m)     Head Circumference --      Peak Flow --      Pain Score 01/02/22 1357 5     Pain Loc --      Pain Edu? --      Excl. in [place]? --     Most recent vital signs: Vitals:   01/02/22 1403 01/02/22 1839  BP: 130/68 132/70  Pulse: 71 70  Resp: 20 18  Temp: 98.5 F (36.9 C) 98.4 F (36.9 C)  SpO2: 96% 96%    Constitutional: Alert and oriented. Eyes:  Conjunctivae are normal. Head: Atraumatic. Nose: No congestion/rhinnorhea. Mouth/Throat: Mucous membranes are moist.  Cardiovascular: Normal rate, regular rhythm. Grossly normal heart sounds.  2+ radial and DP pulses bilaterally. Respiratory: Normal respiratory effort.  No retractions. Lungs CTAB. Gastrointestinal: Soft and nontender. No distention. Musculoskeletal: 2+ pitting edema to mid shins bilaterally with associated calf tenderness. Neurologic:  Normal speech and language. No gross focal neurologic deficits are appreciated.    ED Results / Procedures / Treatments   Labs (all labs ordered are listed, but only abnormal results are displayed) Labs Reviewed  BASIC METABOLIC PANEL - Abnormal; Notable for the following components:      Result Value   Glucose, Bld 141 (*)    All other components within normal limits  CBC - Abnormal; Notable for the following components:   Hemoglobin 9.9 (*)    HCT 31.4 (*)    MCV 73.4 (*)    MCH 23.1 (*)    RDW 15.9 (*)    All other components within normal limits  BRAIN NATRIURETIC PEPTIDE  HEPATIC FUNCTION PANEL  HCG, QUANTITATIVE, PREGNANCY  TROPONIN I (HIGH SENSITIVITY)     EKG  ED ECG REPORT I, Chesley Noon, the attending physician, personally viewed and interpreted this ECG.   Date: 01/02/2022  EKG Time: 13:59  Rate: 83  Rhythm: normal sinus rhythm, PVC  Axis: Normal  Intervals:none  ST&T Change: None  RADIOLOGY CXR reviewed and interpreted by me with no infiltrate, edema, or effusion.  PROCEDURES:  Critical Care performed: No  Procedures   MEDICATIONS ORDERED IN ED: Medications - No data to display   IMPRESSION / MDM / ASSESSMENT AND PLAN / ED COURSE  I reviewed the triage vital signs and the nursing notes.                              46 y.o. female with past medical history of hypertension, hyperlipidemia, diabetes, and anemia who presents to the ED complaining of worsening swelling in both legs for the  past week, associated with intermittent pain in her chest.  Patient's presentation is most consistent with acute presentation with potential threat to life or bodily function.  Differential diagnosis includes, but is not limited to, ACS, arrhythmia, PE, CHF, pneumonia, pneumothorax, renal dysfunction, hepatic dysfunction, electrolyte abnormality, venous insufficiency.  Patient nontoxic-appearing and in no acute distress, vital signs are unremarkable.  She is neurovascular intact to her bilateral lower extremities with strong DP pulses.  She has previously been treated with compression stockings and torsemide, but now states this has not been working.  We will further assess for DVT with lower extremity ultrasound, no evidence of cellulitis or other infectious process.  Regarding her chest pain, work-up thus far has been reassuring.  EKG shows no evidence of arrhythmia or ischemia and troponin within normal limits.  Low suspicion for PE given her chest pain has resolved with reassuring vital signs, however if DVT study is positive then she would benefit from CTA.  Additional labs are reassuring with stable chronic anemia, no significant leukocytosis, electrolyte abnormality, or AKI.  We will add on LFTs to assess for hepatic dysfunction.  Lower extremity ultrasound is negative for DVT, patient continues to deny chest pain and I doubt PE at this time.  Suspect her leg swelling is due to venous insufficiency and she would be appropriate for discharge home with PCP follow-up.  She was counseled to use compression stockings consistently, we will also change torsemide to furosemide to see if this has an improved effect.  She was counseled to return to the ED for new or worsening symptoms, patient agrees with plan.      FINAL CLINICAL IMPRESSION(S) / ED DIAGNOSES   Final diagnoses:  Peripheral edema  Venous insufficiency  Chest pain, unspecified type     Rx / DC Orders   ED Discharge Orders           Ordered    furosemide (LASIX) 20 MG tablet  Daily        01/02/22 1921             Note:  This document was prepared using Dragon voice recognition software and may include unintentional dictation errors.   Chesley Noon, MD 01/02/22 [phone]

## 2022-01-02 NOTE — ED Triage Notes (Signed)
Pt via POV from home. Pt c/o bilateral leg swelling. Pt states she was taking torsemide and it wasn't working, states that the past week she hasn't been taking because it was making her swelling worse. Pt states she also having chest pain that started last. States it is generalized all over her chest pain. Denies SOB. Pt is A&OX4 and NAD

## 2022-01-03 ENCOUNTER — Other Ambulatory Visit: Payer: Self-pay

## 2022-01-04 ENCOUNTER — Other Ambulatory Visit: Payer: Self-pay

## 2022-01-09 ENCOUNTER — Telehealth: Payer: Self-pay | Admitting: Emergency Medicine

## 2022-01-09 NOTE — Telephone Encounter (Signed)
Called patient and left message to call and schedule a 2 week follow up appointment from her ED visit on 01/02/22.

## 2022-01-16 ENCOUNTER — Other Ambulatory Visit: Payer: Self-pay

## 2022-01-16 ENCOUNTER — Other Ambulatory Visit: Payer: Self-pay | Admitting: Gerontology

## 2022-01-16 ENCOUNTER — Ambulatory Visit: Payer: Self-pay | Admitting: Gerontology

## 2022-01-16 ENCOUNTER — Encounter: Payer: Self-pay | Admitting: Gerontology

## 2022-01-16 VITALS — BP 122/72 | HR 71 | Temp 98.2°F | Resp 16 | Ht 68.0 in | Wt 281.9 lb

## 2022-01-16 DIAGNOSIS — Z794 Long term (current) use of insulin: Secondary | ICD-10-CM

## 2022-01-16 DIAGNOSIS — E1169 Type 2 diabetes mellitus with other specified complication: Secondary | ICD-10-CM

## 2022-01-16 DIAGNOSIS — D649 Anemia, unspecified: Secondary | ICD-10-CM

## 2022-01-16 DIAGNOSIS — R600 Localized edema: Secondary | ICD-10-CM

## 2022-01-16 LAB — GLUCOSE, POCT (MANUAL RESULT ENTRY): POC Glucose: 90 mg/dl (ref 70–99)

## 2022-01-16 MED ORDER — INSULIN LISPRO (1 UNIT DIAL) 100 UNIT/ML (KWIKPEN)
3.0000 [IU] | PEN_INJECTOR | Freq: Three times a day (TID) | SUBCUTANEOUS | 3 refills | Status: DC
  Filled 2022-01-16: qty 15, 50d supply, fill #0

## 2022-01-16 MED ORDER — NOVOLOG FLEXPEN 100 UNIT/ML ~~LOC~~ SOPN
3.0000 [IU] | PEN_INJECTOR | Freq: Three times a day (TID) | SUBCUTANEOUS | 11 refills | Status: DC
  Filled 2022-01-16: qty 15, fill #0
  Filled 2022-04-20: qty 45, 90d supply, fill #0
  Filled 2022-04-20: qty 15, fill #0
  Filled 2022-06-28 (×2): qty 45, 90d supply, fill #1

## 2022-01-16 NOTE — Progress Notes (Unsigned)
Established Patient Office Visit  Subjective   Patient ID: [name], female    DOB: 1975-10-19  Age: 46 y.o. MRN: [id number]  Chief Complaint  Patient presents with   Hospitalization Follow-up    Patient in today for follow up [facility] ED on 01/02/22 for LE Edema.    HPI  [name] is a 46 year old female who has history of depression, type 2 diabetes with peripheral neuropathy, GERD, headache, kidney stones, hypertension presents for follow up visit . She checks her blood glucose bid, but has not been checked today. She checked it last yesterday evening and it was 185 mg/dl. She states that she takes Novolog bid instead of tid and Lantus. She states that she is not taking 1 mg Ozempic because of nausea/vomiting and diaddhrea, so she continued with 0.5 mg weekly. She states that bilateral leg edema has improved after been seen at the ED on 01/02/22.  Review of Systems  Constitutional: Negative.   Respiratory: Negative.    Cardiovascular:  Positive for leg swelling.  Neurological: Negative.   Endo/Heme/Allergies: Negative.   Psychiatric/Behavioral: Negative.        Objective:     BP 122/72 (BP Location: Left Arm, Patient Position: Sitting, Cuff Size: Large)   Pulse 71   Temp 98.2 F (36.8 C) (Oral)   Resp 16   Ht 5\' 8"  (1.727 m)   Wt 281 lb 14.4 oz (127.9 kg)   LMP 09/29/2018 (Approximate)   SpO2 96%   BMI 42.86 kg/m  BP Readings from Last 3 Encounters:  01/16/22 122/72  01/02/22 120/71  11/29/21 (!) 114/57   Wt Readings from Last 3 Encounters:  01/16/22 281 lb 14.4 oz (127.9 kg)  01/02/22 283 lb (128.4 kg)  11/29/21 284 lb 3.2 oz (128.9 kg)      Physical Exam Musculoskeletal:     Right lower leg: Edema present.     Left lower leg: Edema present.      Results for orders placed or performed in visit on 01/16/22  POCT Glucose (CBG)  Result Value Ref Range   POC Glucose 90 70 - 99 mg/dl    Last CBC Lab Results  Component Value Date    WBC 7.7 01/02/2022   HGB 9.9 (L) 01/02/2022   HCT 31.4 (L) 01/02/2022   MCV 73.4 (L) 01/02/2022   MCH 23.1 (L) 01/02/2022   RDW 15.9 (H) 01/02/2022   PLT 339 01/02/2022   Last metabolic panel Lab Results  Component Value Date   GLUCOSE 141 (H) 01/02/2022   NA 140 01/02/2022   K 3.7 01/02/2022   CL 105 01/02/2022   CO2 25 01/02/2022   BUN 10 01/02/2022   CREATININE 0.77 01/02/2022   GFRNONAA >60 01/02/2022   CALCIUM 9.2 01/02/2022   PHOS 4.3 08/31/2020   PROT 7.5 01/02/2022   ALBUMIN 3.7 01/02/2022   LABGLOB 3.1 11/28/2021   AGRATIO 1.4 11/28/2021   BILITOT 0.7 01/02/2022   ALKPHOS 85 01/02/2022   AST 18 01/02/2022   ALT 12 01/02/2022   ANIONGAP 10 01/02/2022   Last lipids Lab Results  Component Value Date   CHOL 179 11/28/2021   HDL 36 (L) 11/28/2021   LDLCALC 108 (H) 11/28/2021   TRIG 202 (H) 11/28/2021   CHOLHDL 5.0 (H) 11/28/2021   Last hemoglobin A1c Lab Results  Component Value Date   HGBA1C 7.3 (A) 11/28/2021      The 10-year ASCVD risk score (Arnett DK, et al., 2019) is: 9.8%  Assessment & Plan:      Return in about 6 weeks (around 02/28/2022), or if symptoms worsen or fail to improve.    Lecil Tapp Trellis Paganini, NP

## 2022-01-16 NOTE — Patient Instructions (Signed)

## 2022-01-17 LAB — CBC WITH DIFFERENTIAL/PLATELET
Basophils Absolute: 0 10*3/uL (ref 0.0–0.2)
Basos: 1 %
EOS (ABSOLUTE): 0.2 10*3/uL (ref 0.0–0.4)
Eos: 2 %
Hematocrit: 35.2 % (ref 34.0–46.6)
Hemoglobin: 11.4 g/dL (ref 11.1–15.9)
Immature Grans (Abs): 0 10*3/uL (ref 0.0–0.1)
Immature Granulocytes: 0 %
Lymphocytes Absolute: 2 10*3/uL (ref 0.7–3.1)
Lymphs: 26 %
MCH: 24.6 pg — ABNORMAL LOW (ref 26.6–33.0)
MCHC: 32.4 g/dL (ref 31.5–35.7)
MCV: 76 fL — ABNORMAL LOW (ref 79–97)
Monocytes Absolute: 0.5 10*3/uL (ref 0.1–0.9)
Monocytes: 7 %
Neutrophils Absolute: 4.9 10*3/uL (ref 1.4–7.0)
Neutrophils: 64 %
Platelets: 318 10*3/uL (ref 150–450)
RBC: 4.64 x10E6/uL (ref 3.77–5.28)
RDW: 15.3 % (ref 11.7–15.4)
WBC: 7.6 10*3/uL (ref 3.4–10.8)

## 2022-01-17 LAB — IRON AND TIBC
Iron Saturation: 15 % (ref 15–55)
Iron: 49 ug/dL (ref 27–159)
Total Iron Binding Capacity: 331 ug/dL (ref 250–450)
UIBC: 282 ug/dL (ref 131–425)

## 2022-01-17 LAB — B12 AND FOLATE PANEL
Folate: 7.7 ng/mL (ref >3.0)
Vitamin B-12: 410 pg/mL (ref 232–1245)

## 2022-01-18 ENCOUNTER — Other Ambulatory Visit: Payer: Self-pay | Admitting: Emergency Medicine

## 2022-01-19 ENCOUNTER — Other Ambulatory Visit: Payer: Self-pay | Admitting: Gerontology

## 2022-01-19 ENCOUNTER — Other Ambulatory Visit: Payer: Self-pay

## 2022-01-21 ENCOUNTER — Other Ambulatory Visit: Payer: Self-pay

## 2022-01-23 ENCOUNTER — Other Ambulatory Visit: Payer: Self-pay

## 2022-01-24 ENCOUNTER — Other Ambulatory Visit: Payer: Self-pay

## 2022-01-24 ENCOUNTER — Telehealth: Payer: Self-pay | Admitting: Emergency Medicine

## 2022-01-24 NOTE — Telephone Encounter (Signed)
Patient called requesting refill on Furosemide that was given in the ED for 14 days. Spoke with Lanora Manis, NP. She states that patient needs to see cardiology to see if this medication should be continued. Patient has OV with Cardiology 03/08/22.  Advised patient of above. Patient agreed and voiced understanding.

## 2022-01-30 ENCOUNTER — Other Ambulatory Visit: Payer: Self-pay

## 2022-01-31 ENCOUNTER — Other Ambulatory Visit: Payer: Self-pay

## 2022-02-07 ENCOUNTER — Other Ambulatory Visit: Payer: Self-pay

## 2022-02-09 ENCOUNTER — Other Ambulatory Visit: Payer: Self-pay

## 2022-02-13 ENCOUNTER — Other Ambulatory Visit: Payer: Self-pay

## 2022-02-14 ENCOUNTER — Other Ambulatory Visit: Payer: Self-pay

## 2022-02-14 ENCOUNTER — Emergency Department: Payer: Self-pay

## 2022-02-14 ENCOUNTER — Encounter: Payer: Self-pay | Admitting: Emergency Medicine

## 2022-02-14 ENCOUNTER — Emergency Department
Admission: EM | Admit: 2022-02-14 | Discharge: 2022-02-14 | Disposition: A | Discharge status: Home / Self Care | Payer: Self-pay | Attending: Emergency Medicine | Admitting: Emergency Medicine

## 2022-02-14 DIAGNOSIS — E119 Type 2 diabetes mellitus without complications: Secondary | ICD-10-CM | POA: Insufficient documentation

## 2022-02-14 DIAGNOSIS — I10 Essential (primary) hypertension: Secondary | ICD-10-CM | POA: Insufficient documentation

## 2022-02-14 DIAGNOSIS — M5412 Radiculopathy, cervical region: Secondary | ICD-10-CM | POA: Insufficient documentation

## 2022-02-14 MED ORDER — CYCLOBENZAPRINE HCL 10 MG PO TABS
10.0000 mg | ORAL_TABLET | Freq: Three times a day (TID) | ORAL | 0 refills | Status: AC | PRN
  Filled 2022-02-14: qty 30, 10d supply, fill #0

## 2022-02-14 MED ORDER — MELOXICAM 15 MG PO TABS
15.0000 mg | ORAL_TABLET | Freq: Every day | ORAL | 11 refills | Status: DC
  Filled 2022-02-14: qty 30, 30d supply, fill #0
  Filled 2022-03-22 (×2): qty 30, 30d supply, fill #1
  Filled 2022-04-20: qty 30, 30d supply, fill #2
  Filled 2022-05-17: qty 30, 30d supply, fill #3
  Filled 2022-06-28 – 2022-07-04 (×3): qty 30, 30d supply, fill #4

## 2022-02-14 NOTE — ED Provider Notes (Signed)
[facility] Provider Note    Event Date/Time   First MD Initiated Contact with Patient 02/14/22 1302     (approximate)   History   Chief Complaint Arm Pain   HPI [name] is a 46 y.o. female, history of type 2 diabetes, anemia, hyperlipidemia, hypertension, depression, class III obesity, presents emergency department for evaluation of bilateral arm pain.  Patient states that for the past several months she has been having intermittent episodes of pain/numbness/tingling sensation in both of her arms.  Over the past couple weeks, it has worsened.  She states that she can feel it in her hand shooting up to her arm and shoulders.  She states that she does have a history of back issues, particularly with slipped disc in the lumbar region.  Denies any recent falls or injuries.  Denies fever/chills, chest pain, shortness of breath, abdominal pain, headache, neck pain, flank pain, nausea/vomiting, vision changes, hearing changes, rash/lesions, or dizziness/lightheadedness.  History Limitations: No limitations.        Physical Exam  Triage Vital Signs: ED Triage Vitals  Enc Vitals Group     BP 02/14/22 1231 127/69     Pulse Rate 02/14/22 1231 78     Resp 02/14/22 1231 16     Temp 02/14/22 1231 98.3 F (36.8 C)     Temp Source 02/14/22 1231 Oral     SpO2 02/14/22 1231 96 %     Weight 02/14/22 1230 285 lb (129.3 kg)     Height 02/14/22 1230 5\' 8"  (1.727 m)     Head Circumference --      Peak Flow --      Pain Score 02/14/22 1229 5     Pain Loc --      Pain Edu? --      Excl. in [place]? --     Most recent vital signs: Vitals:   02/14/22 1231  BP: 127/69  Pulse: 78  Resp: 16  Temp: 98.3 F (36.8 C)  SpO2: 96%    General: Awake, NAD.  Skin: Warm, dry. No rashes or lesions.  Eyes: PERRL. Conjunctivae normal.  CV: Good peripheral perfusion.  Resp: Normal effort.  Abd: Soft, non-tender. No distention.  Neuro: At baseline. No gross  neurological deficits.  Musculoskeletal: Normal ROM of all extremities.  Focused Exam: No cervical spine tenderness.  No gross deformities to the upper extremities.  Normal grip strength in both arms.  Sensation intact across all dermatomal distributions in the upper extremities.  She is able to abduct and adduct her arms without difficulty.  She does have a positive Spurling test.  Physical Exam    ED Results / Procedures / Treatments  Labs (all labs ordered are listed, but only abnormal results are displayed) Labs Reviewed - No data to display   EKG N/A.    RADIOLOGY  ED Provider Interpretation: I personally reviewed and interpreted the CT scan, minor spondylosis at C5-C7.  Otherwise no significant disc herniations.  CT Cervical Spine Wo Contrast  Result Date: 02/14/2022 CLINICAL DATA:  Pain in both arms for several months. Worsening pain over the last couple weeks. Cervical radiculopathy. EXAM: CT CERVICAL SPINE WITHOUT CONTRAST TECHNIQUE: Multidetector CT imaging of the cervical spine was performed without intravenous contrast. Multiplanar CT image reconstructions were also generated. RADIATION DOSE REDUCTION: This exam was performed according to the departmental dose-optimization program which includes automated exposure control, adjustment of the mA and/or kV according to patient size and/or use of  iterative reconstruction technique. COMPARISON:  Cervical spine radiographs 09/23/2020. FINDINGS: Alignment: Reversal of the usual cervical lordosis without focal angulation or listhesis. Skull base and vertebrae: No evidence of acute cervical spine fracture or traumatic subluxation. Soft tissues and spinal canal: No prevertebral fluid or swelling. No visible canal hematoma. Disc levels: Minor spondylosis with disc bulging and posterior osteophytes at C5-6 and C6-7. No large disc herniation or high-grade foraminal narrowing identified. Upper chest: Unremarkable. Other: None IMPRESSION: 1.  No evidence of acute cervical spine fracture, traumatic subluxation or static signs of instability. 2. Minor spondylosis at C5-6 and C6-7. No large disc herniation or high-grade foraminal narrowing identified. Electronically Signed   By: Richardean Sale M.D.   On: 02/14/2022 13:25    PROCEDURES:  Critical Care performed: N/A.  Procedures    MEDICATIONS ORDERED IN ED: Medications - No data to display   IMPRESSION / MDM / [place] / ED COURSE  I reviewed the triage vital signs and the nursing notes.                              Differential diagnosis includes, but is not limited to, cervical radiculopathy, disc herniation, cervical strain.   Assessment/Plan Presentation is consistent with cervical radiculopathy.  Physical exam notable for positive Spurling test.  Her CT scan does not show any large disc herniation or high-grade foraminal narrowing, however does show some minor spondylosis at C5-C7, which may be contributing to her symptoms.  However, I suspect that she may need outpatient MRI to further evaluate.  Very low suspicion for any serious or life-threatening pathology.  She is still able to maintain normal sensation and motor function in both of her upper extremities.  We will provide her with prescriptions for meloxicam and muscle relaxers.  We will forego steroid treatment given her underlying history of diabetes.  Encouraged her to continue taking Tylenol as needed.  We will additionally provide her with a referral to neurosurgery for further evaluation.  She is amenable to this plan.  Will discharge.  Provided the patient with anticipatory guidance, return precautions, and educational material. Encouraged the patient to return to the emergency department at any time if they begin to experience any new or worsening symptoms. Patient expressed understanding and agreed with the plan.   Patient's presentation is most consistent with acute complicated illness / injury  requiring diagnostic workup.       FINAL CLINICAL IMPRESSION(S) / ED DIAGNOSES   Final diagnoses:  Cervical radiculopathy     Rx / DC Orders   ED Discharge Orders          Ordered    cyclobenzaprine (FLEXERIL) 10 MG tablet  3 times daily PRN        02/14/22 1402    meloxicam (MOBIC) 15 MG tablet  Daily        02/14/22 1402             Note:  This document was prepared using Dragon voice recognition software and may include unintentional dictation errors.   Teodoro Spray, [place] 02/14/22 1404    Blake Divine, MD 02/14/22 [phone]

## 2022-02-14 NOTE — Discharge Instructions (Addendum)
-  You may take the meloxicam as needed for pain.  Do not take ibuprofen or Aleve while you are taking this medication.  However, you may continue to add on Tylenol as needed.  -You may additionally take the cyclobenzaprine, though use caution as it may make you dizzy/drowsy.  You may opt to only take it at night.  -Please call Dr. Cari Caraway (neurosurgeon) today or tomorrow to schedule an appointment for further evaluation.  Let them know that you were seen here in the emergency department.  -Return to the emergency department anytime if you begin to experience any new or worsening symptoms.

## 2022-02-14 NOTE — ED Triage Notes (Signed)
Pt to ED via POV, pt states that she has been having pain in both her arms for several months, Pt states that the pain got worse a couple weeks ago. Pt states that it states in her hands and shoots up her arms but other states it feels like it states in her shoulders and shoots down her arms. Pt is currently in NAD.

## 2022-02-27 ENCOUNTER — Ambulatory Visit: Payer: Self-pay

## 2022-02-27 ENCOUNTER — Other Ambulatory Visit: Payer: Self-pay

## 2022-02-28 ENCOUNTER — Ambulatory Visit: Payer: Self-pay | Admitting: Gerontology

## 2022-02-28 ENCOUNTER — Encounter: Payer: Self-pay | Admitting: Gerontology

## 2022-02-28 ENCOUNTER — Other Ambulatory Visit: Payer: Self-pay

## 2022-02-28 VITALS — BP 138/70 | HR 82 | Temp 98.1°F | Resp 16 | Ht 68.0 in | Wt 288.5 lb

## 2022-02-28 DIAGNOSIS — M79601 Pain in right arm: Secondary | ICD-10-CM

## 2022-02-28 DIAGNOSIS — E1142 Type 2 diabetes mellitus with diabetic polyneuropathy: Secondary | ICD-10-CM

## 2022-02-28 DIAGNOSIS — R600 Localized edema: Secondary | ICD-10-CM

## 2022-02-28 LAB — POCT GLYCOSYLATED HEMOGLOBIN (HGB A1C): Hemoglobin A1C: 6.7 % — AB (ref 4.0–5.6)

## 2022-02-28 LAB — GLUCOSE, POCT (MANUAL RESULT ENTRY): POC Glucose: 117 mg/dl — AB (ref 70–99)

## 2022-02-28 NOTE — Patient Instructions (Addendum)
Edema  Edema is when you have too much fluid in your body or under your skin. Edema may make your legs, feet, and ankles swell. Swelling often happens in looser tissues, such as around your eyes. This is a common condition. It gets more common as you get older. There are many possible causes of edema. These include: Eating too much salt (sodium). Being on your feet or sitting for a long time. Certain medical conditions, such as: Pregnancy. Heart failure. Liver disease. Kidney disease. Cancer. Hot weather may make edema worse. Edema is usually painless. Your skin may look swollen or shiny. Follow these instructions at home: Medicines Take over-the-counter and prescription medicines only as told by your doctor. Your doctor may prescribe a medicine to help your body get rid of extra water (diuretic). Take this medicine if you are told to take it. Eating and drinking Eat a low-salt (low-sodium) diet as told by your doctor. Sometimes, eating less salt may reduce swelling. Depending on the cause of your swelling, you may need to limit how much fluid you drink (fluid restriction). General instructions Raise the injured area above the level of your heart while you are sitting or lying down. Do not sit still or stand for a long time. Do not wear tight clothes. Do not wear garters on your upper legs. Exercise your legs. This can help the swelling go down. Wear compression stockings as told by your doctor. It is important that these are the right size. These should be prescribed by your doctor to prevent possible injuries. If elastic bandages or wraps are recommended, use them as told by your doctor. Contact a doctor if: Treatment is not working. You have heart, liver, or kidney disease and have symptoms of edema. You have sudden and unexplained weight gain. Get help right away if: You have shortness of breath or chest pain. You cannot breathe when you lie down. You have pain, redness, or  warmth in the swollen areas. You have heart, liver, or kidney disease and get edema all of a sudden. You have a fever and your symptoms get worse all of a sudden. These symptoms may be an emergency. Get help right away. Call 911. Do not wait to see if the symptoms will go away. Do not drive yourself to the hospital. Summary Edema is when you have too much fluid in your body or under your skin. Edema may make your legs, feet, and ankles swell. Swelling often happens in looser tissues, such as around your eyes. Raise the injured area above the level of your heart while you are sitting or lying down. Follow your doctor's instructions about diet and how much fluid you can drink. This information is not intended to replace advice given to you by your health care provider. Make sure you discuss any questions you have with your health care provider. Document Revised: 01/02/2021 Document Reviewed: 01/02/2021 Elsevier Patient Education  [place] Eating Plan DASH stands for Dietary Approaches to Stop Hypertension. The DASH eating plan is a healthy eating plan that has been shown to: Reduce high blood pressure (hypertension). Reduce your risk for type 2 diabetes, heart disease, and stroke. Help with weight loss. What are tips for following this plan? Reading food labels Check food labels for the amount of salt (sodium) per serving. Choose foods with less than 5 percent of the Daily Value of sodium. Generally, foods with less than 300 milligrams (mg) of sodium per serving fit into this eating plan. To  find whole grains, look for the word "whole" as the first word in the ingredient list. Shopping Buy products labeled as "low-sodium" or "no salt added." Buy fresh foods. Avoid canned foods and pre-made or frozen meals. Cooking Avoid adding salt when cooking. Use salt-free seasonings or herbs instead of table salt or sea salt. Check with your health care provider or pharmacist before using  salt substitutes. Do not fry foods. Cook foods using healthy methods such as baking, boiling, grilling, roasting, and broiling instead. Cook with heart-healthy oils, such as olive, canola, avocado, soybean, or sunflower oil. Meal planning  Eat a balanced diet that includes: 4 or more servings of fruits and 4 or more servings of vegetables each day. Try to fill one-half of your plate with fruits and vegetables. 6-8 servings of whole grains each day. Less than 6 oz (170 g) of lean meat, poultry, or fish each day. A 3-oz (85-g) serving of meat is about the same size as a deck of cards. One egg equals 1 oz (28 g). 2-3 servings of low-fat dairy each day. One serving is 1 cup (237 mL). 1 serving of nuts, seeds, or beans 5 times each week. 2-3 servings of heart-healthy fats. Healthy fats called omega-3 fatty acids are found in foods such as walnuts, flaxseeds, fortified milks, and eggs. These fats are also found in cold-water fish, such as sardines, salmon, and mackerel. Limit how much you eat of: Canned or prepackaged foods. Food that is high in trans fat, such as some fried foods. Food that is high in saturated fat, such as fatty meat. Desserts and other sweets, sugary drinks, and other foods with added sugar. Full-fat dairy products. Do not salt foods before eating. Do not eat more than 4 egg yolks a week. Try to eat at least 2 vegetarian meals a week. Eat more home-cooked food and less restaurant, buffet, and fast food. Lifestyle When eating at a restaurant, ask that your food be prepared with less salt or no salt, if possible. If you drink alcohol: Limit how much you use to: 0-1 drink a day for women who are not pregnant. 0-2 drinks a day for men. Be aware of how much alcohol is in your drink. In the [place] one drink equals one 12 oz bottle of beer (355 mL), one 5 oz glass of wine (148 mL), or one 1 oz glass of hard liquor (44 mL). General information Avoid eating more than 2,300 mg of  salt a day. If you have hypertension, you may need to reduce your sodium intake to 1,500 mg a day. Work with your health care provider to maintain a healthy body weight or to lose weight. Ask what an ideal weight is for you. Get at least 30 minutes of exercise that causes your heart to beat faster (aerobic exercise) most days of the week. Activities may include walking, swimming, or biking. Work with your health care provider or dietitian to adjust your eating plan to your individual calorie needs. What foods should I eat? Fruits All fresh, dried, or frozen fruit. Canned fruit in natural juice (without added sugar). Vegetables Fresh or frozen vegetables (raw, steamed, roasted, or grilled). Low-sodium or reduced-sodium tomato and vegetable juice. Low-sodium or reduced-sodium tomato sauce and tomato paste. Low-sodium or reduced-sodium canned vegetables. Grains Whole-grain or whole-wheat bread. Whole-grain or whole-wheat pasta. Brown rice. [place]. Bulgur. Whole-grain and low-sodium cereals. Pita bread. Low-fat, low-sodium crackers. Whole-wheat flour tortillas. Meats and other proteins Skinless chicken or [place]. Ground chicken or  [place]. Pork with fat trimmed off. Fish and seafood. Egg whites. Dried beans, peas, or lentils. Unsalted nuts, nut butters, and seeds. Unsalted canned beans. Lean cuts of beef with fat trimmed off. Low-sodium, lean precooked or cured meat, such as sausages or meat loaves. Dairy Low-fat (1%) or fat-free (skim) milk. Reduced-fat, low-fat, or fat-free cheeses. Nonfat, low-sodium ricotta or cottage cheese. Low-fat or nonfat yogurt. Low-fat, low-sodium cheese. Fats and oils Soft margarine without trans fats. Vegetable oil. Reduced-fat, low-fat, or light mayonnaise and salad dressings (reduced-sodium). Canola, safflower, olive, avocado, soybean, and sunflower oils. Avocado. Seasonings and condiments Herbs. Spices. Seasoning mixes without salt. Other foods Unsalted  popcorn and pretzels. Fat-free sweets. The items listed above may not be a complete list of foods and beverages you can eat. Contact a dietitian for more information. What foods should I avoid? Fruits Canned fruit in a light or heavy syrup. Fried fruit. Fruit in cream or butter sauce. Vegetables Creamed or fried vegetables. Vegetables in a cheese sauce. Regular canned vegetables (not low-sodium or reduced-sodium). Regular canned tomato sauce and paste (not low-sodium or reduced-sodium). Regular tomato and vegetable juice (not low-sodium or reduced-sodium). Angie Fava. Olives. Grains Baked goods made with fat, such as croissants, muffins, or some breads. Dry pasta or rice meal packs. Meats and other proteins Fatty cuts of meat. Ribs. Fried meat. Berniece Salines. Bologna, salami, and other precooked or cured meats, such as sausages or meat loaves. Fat from the back of a pig (fatback). Bratwurst. Salted nuts and seeds. Canned beans with added salt. Canned or smoked fish. Whole eggs or egg yolks. Chicken or [place] with skin. Dairy Whole or 2% milk, cream, and half-and-half. Whole or full-fat cream cheese. Whole-fat or sweetened yogurt. Full-fat cheese. Nondairy creamers. Whipped toppings. Processed cheese and cheese spreads. Fats and oils Butter. Stick margarine. Lard. Shortening. Ghee. Bacon fat. Tropical oils, such as coconut, palm kernel, or palm oil. Seasonings and condiments Onion salt, garlic salt, seasoned salt, table salt, and sea salt. Worcestershire sauce. Tartar sauce. Barbecue sauce. Teriyaki sauce. Soy sauce, including reduced-sodium. Steak sauce. Canned and packaged gravies. Fish sauce. Oyster sauce. Cocktail sauce. Store-bought horseradish. Ketchup. Mustard. Meat flavorings and tenderizers. Bouillon cubes. Hot sauces. Pre-made or packaged marinades. Pre-made or packaged taco seasonings. Relishes. Regular salad dressings. Other foods Salted popcorn and pretzels. The items listed above may not be a  complete list of foods and beverages you should avoid. Contact a dietitian for more information. Where to find more information National Heart, Lung, and Blood Institute: https://wilson-eaton.com/ American Heart Association: www.heart.org Academy of Nutrition and Dietetics: www.eatright.[place]: www.kidney.org Summary The DASH eating plan is a healthy eating plan that has been shown to reduce high blood pressure (hypertension). It may also reduce your risk for type 2 diabetes, heart disease, and stroke. When on the DASH eating plan, aim to eat more fresh fruits and vegetables, whole grains, lean proteins, low-fat dairy, and heart-healthy fats. With the DASH eating plan, you should limit salt (sodium) intake to 2,300 mg a day. If you have hypertension, you may need to reduce your sodium intake to 1,500 mg a day. Work with your health care provider or dietitian to adjust your eating plan to your individual calorie needs. This information is not intended to replace advice given to you by your health care provider. Make sure you discuss any questions you have with your health care provider. Document Revised: 04/03/2019 Document Reviewed: 04/03/2019 Elsevier Patient Education  [place]. Carbohydrate Counting for Diabetes Mellitus, Adult  Carbohydrate counting is a method of keeping track of how many carbohydrates you eat. Eating carbohydrates increases the amount of sugar (glucose) in the blood. Counting how many carbohydrates you eat improves how well you manage your blood glucose. This, in turn, helps you manage your diabetes. Carbohydrates are measured in grams (g) per serving. It is important to know how many carbohydrates (in grams or by serving size) you can have in each meal. This is different for every person. A dietitian can help you make a meal plan and calculate how many carbohydrates you should have at each meal and snack. What foods contain  carbohydrates? Carbohydrates are found in the following foods: Grains, such as breads and cereals. Dried beans and soy products. Starchy vegetables, such as potatoes, peas, and corn. Fruit and fruit juices. Milk and yogurt. Sweets and snack foods, such as cake, cookies, candy, chips, and soft drinks. How do I count carbohydrates in foods? There are two ways to count carbohydrates in food. You can read food labels or learn standard serving sizes of foods. You can use either of these methods or a combination of both. Using the Nutrition Facts label The Nutrition Facts list is included on the labels of almost all packaged foods and beverages in the [place]. It includes: The serving size. Information about nutrients in each serving, including the grams of carbohydrate per serving. To use the Nutrition Facts, decide how many servings you will have. Then, multiply the number of servings by the number of carbohydrates per serving. The resulting number is the total grams of carbohydrates that you will be having. Learning the standard serving sizes of foods When you eat carbohydrate foods that are not packaged or do not include Nutrition Facts on the label, you need to measure the servings in order to count the grams of carbohydrates. Measure the foods that you will eat with a food scale or measuring cup, if needed. Decide how many standard-size servings you will eat. Multiply the number of servings by 15. For foods that contain carbohydrates, one serving equals 15 g of carbohydrates. For example, if you eat 2 cups or 10 oz (300 g) of strawberries, you will have eaten 2 servings and 30 g of carbohydrates (2 servings x 15 g = 30 g). For foods that have more than one food mixed, such as soups and casseroles, you must count the carbohydrates in each food that is included. The following list contains standard serving sizes of common carbohydrate-rich foods. Each of these servings has about 15 g of  carbohydrates: 1 slice of bread. 1 six-inch (15 cm) tortilla. ? cup or 2 oz (53 g) cooked rice or pasta.  cup or 3 oz (85 g) cooked or canned, drained and rinsed beans or lentils.  cup or 3 oz (85 g) starchy vegetable, such as peas, corn, or squash.  cup or 4 oz (120 g) hot cereal.  cup or 3 oz (85 g) boiled or mashed potatoes, or  or 3 oz (85 g) of a large baked potato.  cup or 4 fl oz (118 mL) fruit juice. 1 cup or 8 fl oz (237 mL) milk. 1 small or 4 oz (106 g) apple.  or 2 oz (63 g) of a medium banana. 1 cup or 5 oz (150 g) strawberries. 3 cups or 1 oz (28.3 g) popped popcorn. What is an example of carbohydrate counting? To calculate the grams of carbohydrates in this sample meal, follow the steps shown below. Sample meal 3  oz (85 g) chicken breast. ? cup or 4 oz (106 g) brown rice.  cup or 3 oz (85 g) corn. 1 cup or 8 fl oz (237 mL) milk. 1 cup or 5 oz (150 g) strawberries with sugar-free whipped topping. Carbohydrate calculation Identify the foods that contain carbohydrates: Rice. Corn. Milk. Strawberries. Calculate how many servings you have of each food: 2 servings rice. 1 serving corn. 1 serving milk. 1 serving strawberries. Multiply each number of servings by 15 g: 2 servings rice x 15 g = 30 g. 1 serving corn x 15 g = 15 g. 1 serving milk x 15 g = 15 g. 1 serving strawberries x 15 g = 15 g. Add together all of the amounts to find the total grams of carbohydrates eaten: 30 g + 15 g + 15 g + 15 g = 75 g of carbohydrates total. What are tips for following this plan? Shopping Develop a meal plan and then make a shopping list. Buy fresh and frozen vegetables, fresh and frozen fruit, dairy, eggs, beans, lentils, and whole grains. Look at food labels. Choose foods that have more fiber and less sugar. Avoid processed foods and foods with added sugars. Meal planning Aim to have the same number of grams of carbohydrates at each meal and for each snack  time. Plan to have regular, balanced meals and snacks. Where to find more information American Diabetes Association: diabetes.org Centers for Disease Control and Prevention: StoreMirror.com.[place] Academy of Nutrition and Dietetics: eatright.org Association of Diabetes Care & Education Specialists: diabeteseducator.org Summary Carbohydrate counting is a method of keeping track of how many carbohydrates you eat. Eating carbohydrates increases the amount of sugar (glucose) in your blood. Counting how many carbohydrates you eat improves how well you manage your blood glucose. This helps you manage your diabetes. A dietitian can help you make a meal plan and calculate how many carbohydrates you should have at each meal and snack. This information is not intended to replace advice given to you by your health care provider. Make sure you discuss any questions you have with your health care provider. Document Revised: 12/02/2019 Document Reviewed: 12/02/2019 Elsevier Patient Education  [place].

## 2022-02-28 NOTE — Progress Notes (Signed)
Established Patient Office Visit  Subjective   Patient ID: [name], female    DOB: [birth year]  Age: 46 y.o. MRN: [id number]  Chief Complaint  Patient presents with   Follow-up    Labs drawn 01/16/22    HPI  [name] is a 46 year old female who has history of depression, type 2 diabetes with peripheral neuropathy, GERD, headache, kidney stones, hypertension presents for follow up visit. She presented to the ED on 02/14/2022 for bilateral arm pain that causes "tingling," "pain," and "numbness." CT cervical spine showed no abnormalities other than minor spondylosis at C5-C6 and C6-C7. Final diagnosis was cervical radiculopathy and she was prescribed flexeril and meloxicam. She states that she still experiences daily arm pain/tingling and the pain radiates to her neck. Flexeril and meloxicam seem to help some. She has an appointment with general surgery on 03/15/22. She states she checks her blood glucose twice a day and it is normally 120-140 mg/dL. Today in clinic her POCT BG was 117 mg/dL and her QBHA1P 3.7%. Her blood pressure was initially 165/71 mmHg today in clinic, but patient stated she had been "running around." Re-check blood pressure was 138/70 mmHg. She does not check her blood pressure at home. She has an appointment with her cardiologist on 03/08/2022 regarding her lower extremity edema. She occasionally wears her compression stockings, but only at night. Overall, she is doing well and offers no other complaints.   Review of Systems  Constitutional:  Positive for malaise/fatigue (chronic fatigue).  HENT: Negative.    Eyes: Negative.   Respiratory: Negative.    Cardiovascular:  Positive for leg swelling. Negative for chest pain.  Gastrointestinal: Negative.   Genitourinary:  Positive for frequency. Negative for dysuria, flank pain and urgency.  Musculoskeletal:  Positive for neck pain.  Neurological:  Positive for tingling (bilateral hands).   Endo/Heme/Allergies:  Positive for polydipsia.      Objective:     BP 138/70 (BP Location: Right Arm, Patient Position: Sitting, Cuff Size: Large)   Pulse 82   Temp 98.1 F (36.7 C) (Oral)   Resp 16   Ht 5\' 8"  (1.727 m)   Wt 288 lb 8 oz (130.9 kg)   LMP 09/29/2018 (Approximate)   SpO2 96%   BMI 43.87 kg/m  BP Readings from Last 3 Encounters:  02/28/22 138/70  02/14/22 125/74  01/16/22 122/72   Wt Readings from Last 3 Encounters:  02/28/22 288 lb 8 oz (130.9 kg)  02/14/22 285 lb (129.3 kg)  01/16/22 281 lb 14.4 oz (127.9 kg)    Encourage weight loss  Physical Exam Constitutional:      Appearance: Normal appearance. She is obese.  HENT:     Head: Normocephalic and atraumatic.  Cardiovascular:     Rate and Rhythm: Normal rate and regular rhythm.     Pulses: Normal pulses.     Heart sounds: Normal heart sounds.  Pulmonary:     Effort: Pulmonary effort is normal.     Breath sounds: Normal breath sounds.  Musculoskeletal:     Right lower leg: 4+ Edema present.     Left lower leg: 3+ Edema present.  Skin:    General: Skin is warm and dry.  Neurological:     General: No focal deficit present.     Mental Status: She is alert and oriented to person, place, and time. Mental status is at baseline.  Psychiatric:        Mood and Affect: Mood normal.  Behavior: Behavior normal.        Thought Content: Thought content normal.        Judgment: Judgment normal.      Results for orders placed or performed in visit on 02/28/22  POCT Glucose (CBG)  Result Value Ref Range   POC Glucose 117 (A) 70 - 99 mg/dl  POCT HgB A1C  Result Value Ref Range   Hemoglobin A1C 6.7 (A) 4.0 - 5.6 %   HbA1c POC (<> result, manual entry)     HbA1c, POC (prediabetic range)     HbA1c, POC (controlled diabetic range)      Last CBC Lab Results  Component Value Date   WBC 7.6 01/16/2022   HGB 11.4 01/16/2022   HCT 35.2 01/16/2022   MCV 76 (L) 01/16/2022   MCH 24.6 (L)  01/16/2022   RDW 15.3 01/16/2022   PLT 318 35/57/3220   Last metabolic panel Lab Results  Component Value Date   GLUCOSE 141 (H) 01/02/2022   NA 140 01/02/2022   K 3.7 01/02/2022   CL 105 01/02/2022   CO2 25 01/02/2022   BUN 10 01/02/2022   CREATININE 0.77 01/02/2022   GFRNONAA >60 01/02/2022   CALCIUM 9.2 01/02/2022   PHOS 4.3 08/31/2020   PROT 7.5 01/02/2022   ALBUMIN 3.7 01/02/2022   LABGLOB 3.1 11/28/2021   AGRATIO 1.4 11/28/2021   BILITOT 0.7 01/02/2022   ALKPHOS 85 01/02/2022   AST 18 01/02/2022   ALT 12 01/02/2022   ANIONGAP 10 01/02/2022   Last lipids Lab Results  Component Value Date   CHOL 179 11/28/2021   HDL 36 (L) 11/28/2021   LDLCALC 108 (H) 11/28/2021   TRIG 202 (H) 11/28/2021   CHOLHDL 5.0 (H) 11/28/2021   Last hemoglobin A1c Lab Results  Component Value Date   HGBA1C 6.7 (A) 02/28/2022   Last thyroid functions Lab Results  Component Value Date   TSH 1.990 01/27/2021   T4TOTAL 5.9 10/27/2015   Last vitamin D No results found for: "25OHVITD2", "25OHVITD3", "VD25OH" Last vitamin B12 and Folate Lab Results  Component Value Date   VITAMINB12 410 01/16/2022   FOLATE 7.7 01/16/2022      The 10-year ASCVD risk score (Arnett DK, et al., 2019) is: 16.2%    Assessment & Plan:   1. Type 2 diabetes mellitus with diabetic polyneuropathy, with long-term current use of insulin (HCC) - Her diabetes is improving. She should be checking her blood glucose three times a day. She should continue to take her insulin aspart 3 units plus sliding scale before meals and insulin glargine at bedtime. Continue low-carb diet and exercise as tolerated. Educated on hyper-/hypoglycemic symptoms. Diabetic foot exam performed with some decreased sensation using monofilament.  - POCT HgB A1C; Future - POCT Glucose (CBG); Future - POCT Glucose (CBG) - POCT HgB A1C  2. Bilateral edema of lower extremity - Her lower extremities are still edematous. She was educated on  wearing her compression stockings during the day and to elevate her legs when able. Cardiology appointment scheduled for 03/08/2022.   3. Bilateral arm pain - She has an appointment on 03/15/2022 with general surgery for her neck/arm pain. She should continue to take her flexeril and meloxicam for pain control.    Follow-up in 6 weeks, around 04/11/2022   Rayvon Char, FNP Student

## 2022-03-07 ENCOUNTER — Other Ambulatory Visit: Payer: Self-pay

## 2022-03-08 ENCOUNTER — Ambulatory Visit: Payer: Self-pay | Attending: Cardiology | Admitting: Cardiology

## 2022-03-08 ENCOUNTER — Encounter: Payer: Self-pay | Admitting: Cardiology

## 2022-03-08 VITALS — BP 128/62 | HR 84 | Ht 68.0 in | Wt 290.0 lb

## 2022-03-08 DIAGNOSIS — I5189 Other ill-defined heart diseases: Secondary | ICD-10-CM

## 2022-03-08 DIAGNOSIS — I493 Ventricular premature depolarization: Secondary | ICD-10-CM

## 2022-03-08 DIAGNOSIS — I10 Essential (primary) hypertension: Secondary | ICD-10-CM

## 2022-03-08 NOTE — Patient Instructions (Signed)
Medication Instructions:   Your physician recommends that you continue on your current medications as directed. Please refer to the Current Medication list given to you today.  *If you need a refill on your cardiac medications before your next appointment, please call your pharmacy*   Follow-Up: At [facility], you and your health needs are our priority.  As part of our continuing mission to provide you with exceptional heart care, we have created designated Provider Care Teams.  These Care Teams include your primary Cardiologist (physician) and Advanced Practice Providers (APPs -  Physician Assistants and Nurse Practitioners) who all work together to provide you with the care you need, when you need it.  We recommend signing up for the patient portal called "MyChart".  Sign up information is provided on this After Visit Summary.  MyChart is used to connect with patients for Virtual Visits (Telemedicine).  Patients are able to view lab/test results, encounter notes, upcoming appointments, etc.  Non-urgent messages can be sent to your provider as well.   To learn more about what you can do with MyChart, go to [email or web address].    Your next appointment:   1 year(s)  The format for your next appointment:   In Person  Provider:   Brian Agbor-Etang, MD    Other Instructions   Important Information About Sugar       

## 2022-03-08 NOTE — Progress Notes (Signed)
Cardiology Office Note:    Date:  03/08/2022   ID:  [name], DOB [birth year], MRN [id number]  PCP:  Langston Reusing, NP   [facility] Providers Cardiologist:  Kate Sable, MD     Referring MD: Langston Reusing, NP   Chief Complaint  Patient presents with   Follow-up    6 month follow up    History of Present Illness:    [name] is a 46 y.o. female with a hx of hypertension, frequent PVCs, diabetes, obesity, diastolic dysfunction who presents for follow up.    Has some neck/shoulder pain, was told she have cervical spondylosis, has appointment with neurosurgery regarding this.  Denies palpitations, tolerating Toprol-XL for frequent PVCs.  Edema also well controlled on torsemide.  No new concerns at this time.   Prior notes Echo 11/2020 EF 60 to [id number], grade 2 diastolic dysfunction Cardiac monitor 01/2021, frequent PVCs 11.9% burden   Past Medical History:  Diagnosis Date   Depression    Diabetes mellitus without complication (HCC)    GERD (gastroesophageal reflux disease)    Headache    History of kidney stones    History of nephrolithiasis 2015   via CT imaging, including an obstructing stone   Hyperlipidemia    Hypertension 12/2019   per patient, she has never been treated for high blood pressure   Low back pain 12/2019   Neuromuscular disorder ([place])    neuropathy    Past Surgical History:  Procedure Laterality Date   CHOLECYSTECTOMY  1998   CYSTOSCOPY WITH STENT PLACEMENT Right 12/09/2019   Procedure: CYSTOSCOPY WITH STENT PLACEMENT;  Surgeon: Abbie Sons, MD;  Location: [facility] ORS;  Service: Urology;  Laterality: Right;   CYSTOSCOPY/RETROGRADE/URETEROSCOPY  12/29/2019   Procedure: CYSTOSCOPY/RETROGRADE/URETEROSCOPY;  Surgeon: Abbie Sons, MD;  Location: [facility] ORS;  Service: Urology;;    Current Medications: Current Meds  Medication Sig   Cyanocobalamin (VITAMIN B12 PO) Take 1 tablet by mouth daily.    cyclobenzaprine (FLEXERIL) 10 MG tablet Take 10 mg by mouth 3 (three) times daily as needed for muscle spasms.   insulin aspart (NOVOLOG FLEXPEN) 100 UNIT/ML FlexPen Inject 3 Units into the skin 3 (three) times daily with meals. Sliding scale 70-120 = 0units +3 = 3 units 121-150= 1 unit + 3 units = 4 units 151-200= 2units +3 units= 5 units 201-250 = 3 units + 3 units = 6 units 251-300 = 4 units + 3 units = 7 units 301-350 = 5 units + 3 units = 8 units 351-400 = 6 units + 3 units = 9 units > 400  take 10 units recheck blood sugar after 2 hours if > 400 go to the ED   insulin glargine (LANTUS SOLOSTAR) 100 UNIT/ML Solostar Pen Inject 80 Units into the skin once daily at bedtime.   Insulin Pen Needle (NOVOFINE PEN NEEDLE) 32G X 6 MM MISC Use as directed with novolog flexpen   meloxicam (MOBIC) 15 MG tablet Take 1 tablet (15 mg total) by mouth daily.   metoprolol succinate (TOPROL XL) 25 MG 24 hr tablet Take 1 tablet (25 mg total) by mouth once daily.   potassium citrate (UROCIT-K) 10 MEQ (1080 MG) SR tablet Take 1 tablet (10 mEq total) by mouth 2 (two) times daily with a meal.   Semaglutide,0.25 or 0.5MG /DOS, (OZEMPIC, 0.25 OR 0.5 MG/DOSE,) 2 MG/3ML SOPN Inject 0.5 mg under the skin once a week     Allergies:   Penicillins, Topiramate, Topiramate  er, and Valtrex [valacyclovir]   Social History   Socioeconomic History   Marital status: Single    Spouse name: Not on file   Number of children: Not on file   Years of education: Not on file   Highest education level: Not on file  Occupational History   Occupation: not currently working  Tobacco Use   Smoking status: Never   Smokeless tobacco: Never  Vaping Use   Vaping Use: Never used  Substance and Sexual Activity   Alcohol use: No   Drug use: No   Sexual activity: Never  Other Topics Concern   Not on file  Social History Narrative   - Needs a blood sugar monitor       Patient said she is living with her mom and things are going  ok. Mom provides for her food, transport, etc.       Patient wants to wait until next visit before any information is shared.    Social Determinants of Health   Financial Resource Strain: Low Risk  (11/06/2017)   Overall Financial Resource Strain (CARDIA)    Difficulty of Paying Living Expenses: Not very hard  Food Insecurity: No Food Insecurity (01/16/2022)   Hunger Vital Sign    Worried About Running Out of Food in the Last Year: Never true    Ran Out of Food in the Last Year: Never true  Transportation Needs: No Transportation Needs (01/16/2022)   PRAPARE - Hydrologist (Medical): No    Lack of Transportation (Non-Medical): No  Physical Activity: Inactive (11/06/2017)   Exercise Vital Sign    Days of Exercise per Week: 0 days    Minutes of Exercise per Session: 0 min  Stress: No Stress Concern Present (11/06/2017)   [place]    Feeling of Stress : Not at all  Social Connections: Unknown (11/06/2017)   Social Connection and Isolation Panel [NHANES]    Frequency of Communication with Friends and Family: More than three times a week    Frequency of Social Gatherings with Friends and Family: More than three times a week    Attends Religious Services: More than 4 times per year    Active Member of [employer] or Organizations: No    Attends Music therapist: Never    Marital Status: Not on file     Family History: The patient's family history includes Diabetes in her maternal great-grandmother; Heart disease in her mother; Hypertension in her mother; Kidney failure in her father; Liver disease in her maternal grandfather; Other in her paternal grandfather and paternal grandmother; Pancreatic cancer in her maternal grandmother; Sleep apnea in her sister.  ROS:   Please see the history of present illness.     All other systems reviewed and are negative.  EKGs/Labs/Other Studies  Reviewed:    The following studies were reviewed today:   EKG:  EKG is ordered today. Ekg shows normal sinus rhythm, occasional pvc's  Recent Labs: 01/02/2022: ALT 12; B Natriuretic Peptide 30.0; BUN 10; Creatinine, Ser 0.77; Potassium 3.7; Sodium 140 01/16/2022: Hemoglobin 11.4; Platelets 318  Recent Lipid Panel    Component Value Date/Time   CHOL 179 11/28/2021 1550   TRIG 202 (H) 11/28/2021 1550   HDL 36 (L) 11/28/2021 1550   CHOLHDL 5.0 (H) 11/28/2021 1550   CHOLHDL 4.3 03/29/2007 0615   VLDL 17 03/29/2007 0615   LDLCALC 108 (H) 11/28/2021 1550  Risk Assessment/Calculations:      Physical Exam:    VS:  BP 128/62 (BP Location: Left Arm, Patient Position: Sitting, Cuff Size: Large)   Pulse 84   Ht 5\' 8"  (1.727 m)   Wt 290 lb (131.5 kg)   LMP 09/29/2018 (Approximate)   SpO2 98%   BMI 44.09 kg/m     Wt Readings from Last 3 Encounters:  03/08/22 290 lb (131.5 kg)  02/28/22 288 lb 8 oz (130.9 kg)  02/14/22 285 lb (129.3 kg)     GEN:  Well nourished, well developed in no acute distress HEENT: Normal NECK: No JVD; No carotid bruits CARDIAC: RRR, no murmurs, rubs, gallops RESPIRATORY:  Clear to auscultation without rales, wheezing or rhonchi  ABDOMEN: Soft, non-tender, non-distended MUSCULOSKELETAL:  no edema SKIN: Warm and dry NEUROLOGIC:  Alert and oriented x 3 PSYCHIATRIC:  Normal affect   ASSESSMENT:    1. Frequent PVCs   2. Diastolic dysfunction   3. Primary hypertension   4. Morbid obesity ([place])     PLAN:    In order of problems listed above:  Frequent PVCs, continue Toprol-XL 25 mg daily, denies palpitations.  EF normal. Diastolic dysfunction.  Etiology likely morbid obesity.  Edema controlled with diuretic.  Cont torsemide 20 mg daily.  Hypertension, BP controlled.  cont torsemide, Toprol-XL. Morbid obesity, low calorie diet advised.   Follow-up in 6 months. Patient under a lot of stress due to current living situation.  A lot of time spent  counseling and reassuring patient.   Medication Adjustments/Labs and Tests Ordered: Current medicines are reviewed at length with the patient today.  Concerns regarding medicines are outlined above.  Orders Placed This Encounter  Procedures   EKG 12-Lead    No orders of the defined types were placed in this encounter.    Patient Instructions  Medication Instructions:   Your physician recommends that you continue on your current medications as directed. Please refer to the Current Medication list given to you today.   *If you need a refill on your cardiac medications before your next appointment, please call your pharmacy*  Follow-Up: At [facility], you and your health needs are our priority.  As part of our continuing mission to provide you with exceptional heart care, we have created designated Provider Care Teams.  These Care Teams include your primary Cardiologist (physician) and Advanced Practice Providers (APPs -  Physician Assistants and Nurse Practitioners) who all work together to provide you with the care you need, when you need it.  We recommend signing up for the patient portal called "MyChart".  Sign up information is provided on this After Visit Summary.  MyChart is used to connect with patients for Virtual Visits (Telemedicine).  Patients are able to view lab/test results, encounter notes, upcoming appointments, etc.  Non-urgent messages can be sent to your provider as well.   To learn more about what you can do with MyChart, go to [email or web address].    Your next appointment:   1 year(s)  The format for your next appointment:   In Person  Provider:   Kate Sable, MD    Other Instructions   Important Information About Sugar         Signed, Kate Sable, MD  03/08/2022 12:44 PM    [place]

## 2022-03-13 ENCOUNTER — Other Ambulatory Visit: Payer: Self-pay

## 2022-03-13 MED ORDER — INSULIN GLARGINE 100 UNIT/ML SOLOSTAR PEN
PEN_INJECTOR | SUBCUTANEOUS | 3 refills | Status: DC
  Filled 2022-04-20 – 2022-04-21 (×2): qty 75, fill #0
  Filled 2022-04-23: qty 75, 90d supply, fill #0
  Filled 2022-06-28: qty 75, 90d supply, fill #1

## 2022-03-13 NOTE — Progress Notes (Addendum)
Referring Physician:  Chesley Noon, MD [address] [place],  [place] [address]  Primary Physician:  Rolm Gala, NP  History of Present Illness: 03/15/22 Ms. [name] was seen in ED on 02/14/22 for constant neck and bilateral arm pain to her hands x 2 months. Symptoms started with numbness in right hand (thumb, index, middle), this improved and then she started feeling "vibrations" in right hand. About a month ago, pain spread to left hand. Pain is sharp in nature and worse at night. No weakness in her arms. No balance issues.   Some relief with OTC tylenol and flexeril. She is out of flexeril. Not much relief with mobic.   Had been diagnosed with DM neuropathy in the past.   Conservative measures:  Physical therapy: no  Multimodal medical therapy including regular antiinflammatories: tylenol, flexeril, mobic.   Injections: No epidural steroid injections  Past Surgery: no spinal insurance  Lue Syniyah Bourne has no symptoms of cervical myelopathy.  The symptoms are causing a significant impact on the patient's life.   Review of Systems:  A 10 point review of systems is negative, except for the pertinent positives and negatives detailed in the HPI.  Past Medical History: Past Medical History:  Diagnosis Date   Depression    Diabetes mellitus without complication (HCC)    GERD (gastroesophageal reflux disease)    Headache    History of kidney stones    History of nephrolithiasis 2015   via CT imaging, including an obstructing stone   Hyperlipidemia    Hypertension 12/2019   per patient, she has never been treated for high blood pressure   Low back pain 12/2019   Neuromuscular disorder (HCC)    neuropathy    Past Surgical History: Past Surgical History:  Procedure Laterality Date   CHOLECYSTECTOMY  1998   CYSTOSCOPY WITH STENT PLACEMENT Right 12/09/2019   Procedure: CYSTOSCOPY WITH STENT PLACEMENT;  Surgeon: Riki Altes, MD;  Location:  [facility] ORS;  Service: Urology;  Laterality: Right;   CYSTOSCOPY/RETROGRADE/URETEROSCOPY  12/29/2019   Procedure: CYSTOSCOPY/RETROGRADE/URETEROSCOPY;  Surgeon: Riki Altes, MD;  Location: [facility] ORS;  Service: Urology;;    Allergies: Allergies as of 03/15/2022 - Review Complete 03/08/2022  Allergen Reaction Noted   Penicillins Hives 01/23/2014   Topiramate  02/03/2018   Topiramate er Nausea And Vomiting 02/03/2018   Valtrex [valacyclovir] Itching 12/02/2019    Medications: Outpatient Encounter Medications as of 03/15/2022  Medication Sig   Cyanocobalamin (VITAMIN B12 PO) Take 1 tablet by mouth daily.   cyclobenzaprine (FLEXERIL) 10 MG tablet Take 10 mg by mouth 3 (three) times daily as needed for muscle spasms.   insulin aspart (NOVOLOG FLEXPEN) 100 UNIT/ML FlexPen Inject 3 Units into the skin 3 (three) times daily with meals. Sliding scale 70-120 = 0units +3 = 3 units 121-150= 1 unit + 3 units = 4 units 151-200= 2units +3 units= 5 units 201-250 = 3 units + 3 units = 6 units 251-300 = 4 units + 3 units = 7 units 301-350 = 5 units + 3 units = 8 units 351-400 = 6 units + 3 units = 9 units > 400  take 10 units recheck blood sugar after 2 hours if > 400 go to the ED   insulin glargine (LANTUS SOLOSTAR) 100 UNIT/ML Solostar Pen Inject 80 Units into the skin once daily at bedtime.   Insulin Pen Needle (NOVOFINE PEN NEEDLE) 32G X 6 MM MISC Use as directed with novolog flexpen  meloxicam (MOBIC) 15 MG tablet Take 1 tablet (15 mg total) by mouth daily.   metoprolol succinate (TOPROL XL) 25 MG 24 hr tablet Take 1 tablet (25 mg total) by mouth once daily.   potassium citrate (UROCIT-K) 10 MEQ (1080 MG) SR tablet Take 1 tablet (10 mEq total) by mouth 2 (two) times daily with a meal.   Semaglutide,0.25 or 0.5MG /DOS, (OZEMPIC, 0.25 OR 0.5 MG/DOSE,) 2 MG/3ML SOPN Inject 0.5 mg under the skin once a week   No facility-administered encounter medications on file as of 03/15/2022.    Social  History: Social History   Tobacco Use   Smoking status: Never   Smokeless tobacco: Never  Vaping Use   Vaping Use: Never used  Substance Use Topics   Alcohol use: No   Drug use: No    Family Medical History: Family History  Problem Relation Age of Onset   Hypertension Mother    Heart disease Mother    Kidney failure Father    Sleep apnea Sister    Pancreatic cancer Maternal Grandmother    Liver disease Maternal Grandfather    Other Paternal Grandmother        unknown medical history   Other Paternal Grandfather        unknown medical history   Diabetes Maternal Great-grandmother     Physical Examination: There were no vitals filed for this visit.  General: Patient is well developed, well nourished, calm, collected, and in no apparent distress. Attention to examination is appropriate.  Respiratory: Patient is breathing without any difficulty.   NEUROLOGICAL:     Awake, alert, oriented to person, place, and time.  Speech is clear and fluent. Fund of knowledge is appropriate.   Cranial Nerves: Pupils equal round and reactive to light.  Facial tone is symmetric.  Facial sensation is symmetric.  ROM of spine:  Limited ROM of cervical spine with  pain  No abnormal lesions on exposed skin.   No posterior cervical tenderness. Mild bilateral trapezial tenderness.   Strength: Side Biceps Triceps Deltoid Interossei Grip Wrist Ext. Wrist Flex.  R 5 5 5 5 5 5 5   L 5 5 5 5 5 5 5    Side Iliopsoas Quads Hamstring PF DF EHL  R 5 5 5 5 5 5   L 5 5 5 5 5 5    Reflexes are 2+ and symmetric at the biceps, triceps, brachioradialis, patella and achilles.   Hoffman's is absent.  Clonus is not present.   Bilateral upper and lower extremity sensation is intact to light touch.     Gait is normal.    Medical Decision Making  Imaging: CT of cervical spine dated 02/14/22:  FINDINGS: Alignment: Reversal of the usual cervical lordosis without focal angulation or listhesis.    Skull base and vertebrae: No evidence of acute cervical spine fracture or traumatic subluxation.   Soft tissues and spinal canal: No prevertebral fluid or swelling. No visible canal hematoma.   Disc levels: Minor spondylosis with disc bulging and posterior osteophytes at C5-6 and C6-7. No large disc herniation or high-grade foraminal narrowing identified.   Upper chest: Unremarkable.   Other: None   IMPRESSION: 1. No evidence of acute cervical spine fracture, traumatic subluxation or static signs of instability. 2. Minor spondylosis at C5-6 and C6-7. No large disc herniation or high-grade foraminal narrowing identified.     Electronically Signed   By: Richardean Sale M.D.   On: 02/14/2022 13:25    I have personally reviewed the  images and agree with the above interpretation.  Assessment and Plan: Ms. Topp is a pleasant 46 y.o. female with constant neck and bilateral arm pain to her hands x 2 months. No weakness in her arms. No balance issues.   She has known spondylosis at C5-6 and C6-7 per CT scan. She likely has some myofascial pain as well- she has tenderness in trapezial region bilaterally.   Treatment options discussed with patient and following plan made:   - PT for cervical spine. [facility] to [facility].  - Recommend MRI of cervical spine to further evaluation bilateral cervical radiculopathy. No improvement with time or medications.  - She does not have insurance, she thinks she is covered under [employer] assistance. Given information for her to call and check on this. She will let me know either way. Will hold on ordering MRI until we get more information.  - Some relief with flexeril. Refill given. Reviewed dosing and side effects. She knows it can make her sleepy.  - Depending on results of MRI, may refer to pain clinic for possible injections. This will also depend on her coverage.  I spent a total of 30 minutes in face-to-face and non-face-to-face activities  related to this patient's care toda including review of outside records, review of imaging, review of symptoms, physical exam, discussion of differential diagnosis, discussion of treatment options, and documentation.   Thank you for involving me in the care of this patient.   Drake Leach PA-C Dept. of Neurosurgery

## 2022-03-15 ENCOUNTER — Encounter: Payer: Self-pay | Admitting: Orthopedic Surgery

## 2022-03-15 ENCOUNTER — Other Ambulatory Visit: Payer: Self-pay

## 2022-03-15 ENCOUNTER — Ambulatory Visit (INDEPENDENT_AMBULATORY_CARE_PROVIDER_SITE_OTHER): Payer: Self-pay | Admitting: Orthopedic Surgery

## 2022-03-15 VITALS — BP 144/84 | Ht 68.0 in | Wt 286.4 lb

## 2022-03-15 DIAGNOSIS — M47812 Spondylosis without myelopathy or radiculopathy, cervical region: Secondary | ICD-10-CM

## 2022-03-15 DIAGNOSIS — M4722 Other spondylosis with radiculopathy, cervical region: Secondary | ICD-10-CM

## 2022-03-15 DIAGNOSIS — M5412 Radiculopathy, cervical region: Secondary | ICD-10-CM

## 2022-03-15 MED ORDER — CYCLOBENZAPRINE HCL 10 MG PO TABS
10.0000 mg | ORAL_TABLET | Freq: Three times a day (TID) | ORAL | 0 refills | Status: DC | PRN
  Filled 2022-03-15: qty 60, 20d supply, fill #0

## 2022-03-15 NOTE — Patient Instructions (Addendum)
It was so nice to see you today, I am sorry that you are hurting so much.   You have some wear and tear (arthritis) in your neck and I think this is likely causing your pain.   I would like to get an MRI of your neck. Please let me know about the charity coverage- you can message or call me.   I sent physical therapy orders to [facility], they should call you to schedule.   I also sent a refill of flexeril to help with muscle spasms. Use only as needed and be careful, this can make you sleepy.   Please do not hesitate to call if you have any questions or concerns. You can also message me in [place].   Geronimo Boot PA-C [phone]

## 2022-03-19 ENCOUNTER — Other Ambulatory Visit: Payer: Self-pay

## 2022-03-21 ENCOUNTER — Other Ambulatory Visit: Payer: Self-pay

## 2022-03-22 ENCOUNTER — Other Ambulatory Visit: Payer: Self-pay

## 2022-03-26 ENCOUNTER — Other Ambulatory Visit: Payer: Self-pay | Admitting: Urology

## 2022-03-27 ENCOUNTER — Other Ambulatory Visit: Payer: Self-pay

## 2022-03-27 MED ORDER — POTASSIUM CITRATE ER 10 MEQ (1080 MG) PO TBCR
10.0000 meq | EXTENDED_RELEASE_TABLET | Freq: Two times a day (BID) | ORAL | 3 refills | Status: DC
  Filled 2022-03-27 – 2022-04-12 (×2): qty 60, 30d supply, fill #0
  Filled 2022-04-20 – 2022-04-21 (×2): qty 60, 30d supply, fill #1
  Filled 2022-04-23: qty 46, 23d supply, fill #1
  Filled 2022-04-23: qty 14, 7d supply, fill #1
  Filled 2022-06-28: qty 60, 30d supply, fill #2
  Filled 2022-09-07 (×2): qty 60, 30d supply, fill #3

## 2022-04-03 ENCOUNTER — Ambulatory Visit: Payer: Self-pay

## 2022-04-10 ENCOUNTER — Other Ambulatory Visit: Payer: Self-pay

## 2022-04-12 ENCOUNTER — Other Ambulatory Visit: Payer: Self-pay

## 2022-04-20 ENCOUNTER — Other Ambulatory Visit: Payer: Self-pay | Admitting: Orthopedic Surgery

## 2022-04-20 ENCOUNTER — Other Ambulatory Visit: Payer: Self-pay

## 2022-04-20 DIAGNOSIS — M5412 Radiculopathy, cervical region: Secondary | ICD-10-CM

## 2022-04-20 DIAGNOSIS — M47812 Spondylosis without myelopathy or radiculopathy, cervical region: Secondary | ICD-10-CM

## 2022-04-20 MED ORDER — CYCLOBENZAPRINE HCL 10 MG PO TABS
10.0000 mg | ORAL_TABLET | Freq: Three times a day (TID) | ORAL | 0 refills | Status: DC | PRN
  Filled 2022-04-20: qty 60, 20d supply, fill #0

## 2022-04-20 NOTE — Telephone Encounter (Signed)
Okay for flexeril refill. I sent to pharmacy.   Did she ever find out about financial assistance from [facility]? She was supposed to let me know so I could get an MRI.

## 2022-04-20 NOTE — Telephone Encounter (Signed)
Sent pt mychart message

## 2022-04-22 ENCOUNTER — Other Ambulatory Visit: Payer: Self-pay

## 2022-04-23 ENCOUNTER — Other Ambulatory Visit: Payer: Self-pay

## 2022-04-23 ENCOUNTER — Other Ambulatory Visit: Payer: Self-pay | Admitting: Gerontology

## 2022-04-23 DIAGNOSIS — I10 Essential (primary) hypertension: Secondary | ICD-10-CM

## 2022-04-23 MED ORDER — INSULIN PEN NEEDLE 31G X 6 MM MISC
0 refills | Status: AC
  Filled 2022-04-23: qty 100, 25d supply, fill #0
  Filled 2022-06-28: qty 200, 50d supply, fill #1
  Filled 2022-08-18: qty 100, 25d supply, fill #2

## 2022-04-24 ENCOUNTER — Other Ambulatory Visit: Payer: Self-pay

## 2022-04-24 NOTE — Telephone Encounter (Signed)
Requested too soon, last filled 03/19/22 #90. Patient will also needs an OV.

## 2022-04-30 ENCOUNTER — Other Ambulatory Visit: Payer: Self-pay

## 2022-05-10 ENCOUNTER — Other Ambulatory Visit: Payer: Self-pay

## 2022-05-11 ENCOUNTER — Other Ambulatory Visit: Payer: Self-pay

## 2022-05-17 ENCOUNTER — Other Ambulatory Visit: Payer: Self-pay

## 2022-05-18 ENCOUNTER — Other Ambulatory Visit: Payer: Self-pay

## 2022-05-21 ENCOUNTER — Other Ambulatory Visit: Payer: Self-pay

## 2022-05-23 ENCOUNTER — Other Ambulatory Visit: Payer: Self-pay

## 2022-06-07 ENCOUNTER — Other Ambulatory Visit: Payer: Self-pay

## 2022-06-08 ENCOUNTER — Other Ambulatory Visit: Payer: Self-pay

## 2022-06-28 ENCOUNTER — Other Ambulatory Visit: Payer: Self-pay

## 2022-06-28 ENCOUNTER — Other Ambulatory Visit: Payer: Self-pay | Admitting: Gerontology

## 2022-06-28 DIAGNOSIS — I10 Essential (primary) hypertension: Secondary | ICD-10-CM

## 2022-06-28 MED ORDER — METOPROLOL SUCCINATE ER 25 MG PO TB24
25.0000 mg | ORAL_TABLET | Freq: Every day | ORAL | 1 refills | Status: DC
  Filled 2022-06-28: qty 90, 90d supply, fill #0
  Filled 2022-09-24: qty 90, 90d supply, fill #1

## 2022-07-03 ENCOUNTER — Other Ambulatory Visit: Payer: Self-pay

## 2022-07-04 ENCOUNTER — Other Ambulatory Visit: Payer: Self-pay

## 2022-07-10 ENCOUNTER — Telehealth: Payer: Self-pay
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
## 2022-07-12 ENCOUNTER — Other Ambulatory Visit: Payer: Self-pay

## 2022-07-13 ENCOUNTER — Other Ambulatory Visit: Payer: Self-pay

## 2022-07-19 ENCOUNTER — Other Ambulatory Visit: Payer: Self-pay

## 2022-07-22 ENCOUNTER — Other Ambulatory Visit: Payer: Self-pay

## 2022-07-24 ENCOUNTER — Other Ambulatory Visit: Payer: Self-pay
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
## 2022-07-26 ENCOUNTER — Encounter: Payer: Self-pay | Admitting: Orthopedic Surgery

## 2022-07-26 ENCOUNTER — Other Ambulatory Visit: Payer: Self-pay

## 2022-07-26 ENCOUNTER — Ambulatory Visit (INDEPENDENT_AMBULATORY_CARE_PROVIDER_SITE_OTHER): Payer: Self-pay | Admitting: Orthopedic Surgery

## 2022-07-26 VITALS — BP 136/76 | Ht 68.0 in | Wt 275.0 lb

## 2022-07-26 DIAGNOSIS — M4722 Other spondylosis with radiculopathy, cervical region: Secondary | ICD-10-CM

## 2022-07-26 DIAGNOSIS — M47812 Spondylosis without myelopathy or radiculopathy, cervical region: Secondary | ICD-10-CM

## 2022-07-26 DIAGNOSIS — M5412 Radiculopathy, cervical region: Secondary | ICD-10-CM

## 2022-07-26 MED ORDER — METHYLPREDNISOLONE 4 MG PO TBPK
ORAL_TABLET | ORAL | 0 refills | Status: AC
  Filled 2022-07-26 (×2): qty 21, 6d supply, fill #0

## 2022-07-26 MED ORDER — METHOCARBAMOL 500 MG PO TABS
500.0000 mg | ORAL_TABLET | Freq: Three times a day (TID) | ORAL | 0 refills | Status: DC | PRN
  Filled 2022-07-26: qty 60, 20d supply, fill #0

## 2022-07-26 NOTE — Patient Instructions (Signed)
It was so nice to see you today. Thank you so much for coming in.    You have some wear and tear in your neck and this is likely what is causing your pain.   Go to Patient Financial Services in the [place] building on the second floor and ask for [employer]   I sent a prescription for a steroid dose pack to help with pain and inflammation. Take as directed. This can elevate your blood sugars. Check them and call your PCP with any concerns. Stop meloxicam while on the steroids.   I also sent a prescription for methocarbamol to help with muscle spasms. Use only as needed and be careful, this can make you sleepy. Stop the cyclobenzaprine.   Let me know when you are approved for financial assistance- we can order PT, discuss an MRI, and discuss possible injections.   Please do not hesitate to call if you have any questions or concerns. You can also message me in [place].   I will message you in 3-4 weeks to check on you.   Geronimo Boot PA-C [phone]

## 2022-07-27 ENCOUNTER — Other Ambulatory Visit: Payer: Self-pay | Admitting: Gerontology

## 2022-07-27 ENCOUNTER — Other Ambulatory Visit: Payer: Self-pay

## 2022-08-08 ENCOUNTER — Other Ambulatory Visit: Payer: Self-pay

## 2022-08-09 ENCOUNTER — Encounter: Payer: Self-pay | Admitting: Gerontology

## 2022-08-09 ENCOUNTER — Other Ambulatory Visit: Payer: Self-pay

## 2022-08-09 ENCOUNTER — Ambulatory Visit: Payer: Self-pay | Admitting: Gerontology

## 2022-08-09 VITALS — BP 127/78 | HR 82 | Temp 97.9°F | Resp 16 | Ht 68.0 in | Wt 282.9 lb

## 2022-08-09 DIAGNOSIS — E1142 Type 2 diabetes mellitus with diabetic polyneuropathy: Secondary | ICD-10-CM

## 2022-08-09 LAB — GLUCOSE, POCT (MANUAL RESULT ENTRY): POC Glucose: 302 mg/dl — AB (ref 70–99)

## 2022-08-09 LAB — POCT GLYCOSYLATED HEMOGLOBIN (HGB A1C): Hemoglobin A1C: 8.5 % — AB (ref 4.0–5.6)

## 2022-08-09 MED ORDER — NOVOLOG FLEXPEN 100 UNIT/ML ~~LOC~~ SOPN
5.0000 [IU] | PEN_INJECTOR | Freq: Three times a day (TID) | SUBCUTANEOUS | 11 refills | Status: DC
  Filled 2022-08-09: qty 10, 67d supply, fill #0
  Filled 2022-08-18: qty 15, 50d supply, fill #0

## 2022-08-09 MED ORDER — OZEMPIC (0.25 OR 0.5 MG/DOSE) 2 MG/3ML ~~LOC~~ SOPN
PEN_INJECTOR | SUBCUTANEOUS | 0 refills | Status: DC
  Filled 2022-08-09: qty 12, 4d supply, fill #0

## 2022-08-09 MED ORDER — SEMAGLUTIDE (1 MG/DOSE) 4 MG/3ML ~~LOC~~ SOPN
1.0000 mg | PEN_INJECTOR | SUBCUTANEOUS | 2 refills | Status: DC
  Filled 2022-08-09: qty 3, fill #0
  Filled 2022-08-18: qty 3, 28d supply, fill #0
  Filled 2022-09-07 – 2022-09-24 (×2): qty 3, 28d supply, fill #1

## 2022-08-09 NOTE — Patient Instructions (Signed)

## 2022-08-09 NOTE — Progress Notes (Signed)
Established Patient Office Visit  Subjective   Patient ID: [name], female    DOB: [birth year]  Age: 47 y.o. MRN: [id number]  Chief Complaint  Patient presents with   Follow-up   Diabetes    Diabetes Associated symptoms include blurred vision and polydipsia.   HPI  [name] is a 47 year old female who has history of depression, type 2 diabetes with peripheral neuropathy, GERD, headache, kidney stones, hypertension presents for follow up visit. She no showed to her appointments for 5 months. She states that she's compliant with her medications, denies side effects and continues to make healthy lifestyle changes. At today's visit, her HgbA1c increased from 6.7% to 8.5% and glucose is 302 mg/dL. she reports checking her glucose two times a day at home, reading raged from 150 to 250 mg/dL. She takes Semaglutide 1 mg weekly instead of 0.5 mg weekly which prescribed during last office visit. She was seen at the Neurology clinic on 07/26/22 by Geronimo Boot PA-C for constant neck pain, was prescribed Medrol pack and Robaxin.  Prednisone was completed yesterday.  She denies neck pain during visit. She reports she has issue with cooking at her sister's house where she is currently living in. She eats every meal at restaurants with fast foods. She complaints polyuria, polydipsia, visual changes with blurred vision, and numbness and tingling on hands due to cervical radiculopathy. She performs daily foot checks. She denies chest pain, palpitation, and of breath. Overall. She is doing good, and offer no further complaints.   Review of Systems  Constitutional: Negative.   HENT: Negative.    Eyes:  Positive for blurred vision.  Respiratory: Negative.    Cardiovascular: Negative.   Gastrointestinal: Negative.   Genitourinary:  Positive for frequency.  Skin: Negative.   Neurological: Negative.   Endo/Heme/Allergies:  Positive for polydipsia.  Psychiatric/Behavioral: Negative.         Objective:     BP 127/78 (BP Location: Right Arm, Patient Position: Sitting, Cuff Size: Large)   Pulse 82   Temp 97.9 F (36.6 C) (Oral)   Resp 16   Ht 5\' 8"  (1.727 m)   Wt 282 lb 14.4 oz (128.3 kg)   LMP 09/29/2018 (Approximate)   SpO2 97%   BMI 43.01 kg/m  BP Readings from Last 3 Encounters:  08/09/22 127/78  07/26/22 136/76  03/15/22 (!) 144/84   Wt Readings from Last 3 Encounters:  08/09/22 282 lb 14.4 oz (128.3 kg)  07/26/22 275 lb (124.7 kg)  03/15/22 286 lb 6.4 oz (129.9 kg)     Physical Exam Constitutional:      Appearance: Normal appearance.  HENT:     Head: Normocephalic.     Nose: Nose normal.     Mouth/Throat:     Mouth: Mucous membranes are moist.  Eyes:     Pupils: Pupils are equal, round, and reactive to light.  Cardiovascular:     Rate and Rhythm: Normal rate and regular rhythm.  Pulmonary:     Effort: Pulmonary effort is normal.     Breath sounds: Normal breath sounds.  Abdominal:     Palpations: Abdomen is soft.  Musculoskeletal:        General: Normal range of motion.     Cervical back: Normal range of motion.  Skin:    General: Skin is warm.  Neurological:     General: No focal deficit present.     Mental Status: She is alert and oriented to person, place, and time.  Psychiatric:        Mood and Affect: Mood normal.    Results for orders placed or performed in visit on 08/09/22  POCT Glucose (CBG)  Result Value Ref Range   POC Glucose 302 (A) 70 - 99 mg/dl  POCT HgB A1C  Result Value Ref Range   Hemoglobin A1C 8.5 (A) 4.0 - 5.6 %   HbA1c POC (<> result, manual entry)     HbA1c, POC (prediabetic range)     HbA1c, POC (controlled diabetic range)      Last CBC Lab Results  Component Value Date   WBC 7.6 01/16/2022   HGB 11.4 01/16/2022   HCT 35.2 01/16/2022   MCV 76 (L) 01/16/2022   MCH 24.6 (L) 01/16/2022   RDW 15.3 01/16/2022   PLT 318 [id number]   Last metabolic panel Lab Results  Component Value Date    GLUCOSE 141 (H) 01/02/2022   NA 140 01/02/2022   K 3.7 01/02/2022   CL 105 01/02/2022   CO2 25 01/02/2022   BUN 10 01/02/2022   CREATININE 0.77 01/02/2022   GFRNONAA >60 01/02/2022   CALCIUM 9.2 01/02/2022   PHOS 4.3 08/31/2020   PROT 7.5 01/02/2022   ALBUMIN 3.7 01/02/2022   LABGLOB 3.1 11/28/2021   AGRATIO 1.4 11/28/2021   BILITOT 0.7 01/02/2022   ALKPHOS 85 01/02/2022   AST 18 01/02/2022   ALT 12 01/02/2022   ANIONGAP 10 01/02/2022   Last lipids Lab Results  Component Value Date   CHOL 179 11/28/2021   HDL 36 (L) 11/28/2021   LDLCALC 108 (H) 11/28/2021   TRIG 202 (H) 11/28/2021   CHOLHDL 5.0 (H) 11/28/2021   Last hemoglobin A1c Lab Results  Component Value Date   HGBA1C 8.5 (A) 08/09/2022   Last thyroid functions Lab Results  Component Value Date   TSH 1.990 01/27/2021   T4TOTAL 5.9 10/27/2015   Last vitamin D No results found for: "25OHVITD2", "25OHVITD3", "VD25OH" Last vitamin B12 and Folate Lab Results  Component Value Date   VITAMINB12 410 01/16/2022   FOLATE 7.7 01/16/2022     The 10-year ASCVD risk score (Arnett DK, et al., 2019) is: 11.6%    Assessment & Plan:  dermatitis 1. Type 2 diabetes mellitus with diabetic polyneuropathy, with long-term current use of insulin ([place]) - Her diabetes is not under control, her HgbA1c was 8.5% and her goal should be less than 7%. She was educated to check her glucose three times daily prior to meal, record and bring log to follow up appointment. She was encouraged to continue on low carb/non concentrated sweet diet and exercise as tolerated. She will continue current medication regimen. She will be followed up by endocrinologist. Her Insulin aspart was changed to 5 units with sliding scale and Ozempic increased to 1 mg weekly. She was schedule eye exam on 09/02/2022.  - POCT Glucose (CBG) - POCT HgB A1C - Semaglutide, 2 MG/3ML SOPN; Inject 1 mg into the skin once a week.  Dispense: 3 mL; Refill: 2 - insulin aspart  (NOVOLOG) 100 UNIT/ML injection; Inject 5 Units into the skin 3 (three) times daily with meals. 70-150= 5 units 151-200= 5 units + 1 unit 201- 250= 5 units + 2 units 251- 300= 5 units + 3 units 301 - 350= 5 units + 4 units 351- 400 = 5 units + 5 units If > 400 call clinic or go to the Emergency room  Dispense: 10 mL; Refill: 11   Follow up in one  month on 09/10/2022 in clinic.   Chioma Jerold Coombe, NP

## 2022-08-19 ENCOUNTER — Other Ambulatory Visit: Payer: Self-pay

## 2022-08-20 ENCOUNTER — Other Ambulatory Visit: Payer: Self-pay

## 2022-08-28 ENCOUNTER — Other Ambulatory Visit: Payer: Self-pay

## 2022-08-31 ENCOUNTER — Other Ambulatory Visit: Payer: Self-pay

## 2022-09-06 ENCOUNTER — Other Ambulatory Visit: Payer: Self-pay

## 2022-09-06 ENCOUNTER — Ambulatory Visit: Payer: Self-pay | Admitting: Gerontology

## 2022-09-06 ENCOUNTER — Encounter: Payer: Self-pay | Admitting: Gerontology

## 2022-09-06 VITALS — BP 144/75 | HR 66 | Temp 98.0°F | Resp 16 | Ht 68.0 in | Wt 281.0 lb

## 2022-09-06 DIAGNOSIS — E1142 Type 2 diabetes mellitus with diabetic polyneuropathy: Secondary | ICD-10-CM

## 2022-09-06 DIAGNOSIS — Z8669 Personal history of other diseases of the nervous system and sense organs: Secondary | ICD-10-CM

## 2022-09-06 DIAGNOSIS — Z794 Long term (current) use of insulin: Secondary | ICD-10-CM

## 2022-09-06 LAB — GLUCOSE, POCT (MANUAL RESULT ENTRY): POC Glucose: 117 mg/dl — AB (ref 70–99)

## 2022-09-06 MED ORDER — SUMATRIPTAN SUCCINATE 50 MG PO TABS
50.0000 mg | ORAL_TABLET | ORAL | 0 refills | Status: DC | PRN
  Filled 2022-09-06: qty 9, 30d supply, fill #0

## 2022-09-06 NOTE — Progress Notes (Addendum)
New Patient Office Visit  Subjective    Patient ID: [name], female    DOB: [birth year]  Age: 47 y.o. MRN: [id number]  CC:  Chief Complaint  Patient presents with   Follow-up   Diabetes    Patient had eye exam on 09/02/22. Will pick up glasses in ~3 weeks.    HPI [name] is a 47 years old female who has a history of type 2 diabetes mellitus  with peripheral neuropathy, GERD, headache, kidney stones and Hypertension who  presents today for a follow up.  She complained of throbbing  headache behind her eyes  that started a few months ago with on and off episodes . She said she has been taking ibuprofen OTC 600 mg twice a day which  offers minimal relief. She said that the pain is at 6 /10 on a pain scale. She stated that she is compliant with her medications and her blood glucose at today's visit was 117 mg/dl and her WUJW1X was 9.1% during the last visit. She stated that she stays with her other sister who let her cook. She  said that her blurry vision has improved, denies hypo/hyperglycemic. She denies chest pain, palpitation, and shortness of breath.  She  states that she is overall doing good, and offers no further complaints.   Outpatient Encounter Medications as of 09/06/2022  Medication Sig   insulin aspart (NOVOLOG FLEXPEN) 100 UNIT/ML FlexPen Inject 5 Units into the skin 3 (three) times daily with meals. 151-200= 5 units + 1 unit, 201- 250= 5 units + 2 units, 251- 300= 5 units + 3 units, 301 - 350= 5 units + 4 units,351- 400 = 5 units + 5 units,If > 400 call clinic or go to the Emergency room   insulin glargine (LANTUS SOLOSTAR) 100 UNIT/ML Solostar Pen Inject 80 Units into the skin once daily at bedtime.   Insulin Pen Needle (NOVOFINE PEN NEEDLE) 32G X 6 MM MISC Use as directed with novolog flexpen   Insulin Pen Needle 31G X 6 MM MISC use with insulin pens   metoprolol succinate (TOPROL XL) 25 MG 24 hr tablet Take 1 tablet (25 mg total) by mouth once daily.    potassium citrate (UROCIT-K) 10 MEQ (1080 MG) SR tablet Take 1 tablet (10 mEq total) by mouth 2 (two) times daily with a meal.   Semaglutide, 1 MG/DOSE, 4 MG/3ML SOPN Inject 1 mg into the skin once a week.   methocarbamol (ROBAXIN) 500 MG tablet Take 1 tablet (500 mg total) by mouth every 8 (eight) hours as needed for muscle spasms. (Patient not taking: Reported on 09/06/2022)   [DISCONTINUED] Cyanocobalamin (VITAMIN B12 PO) Take 1 tablet by mouth daily. (Patient not taking: Reported on 08/09/2022)   No facility-administered encounter medications on file as of 09/06/2022.    Past Medical History:  Diagnosis Date   Depression    Diabetes mellitus without complication    GERD (gastroesophageal reflux disease)    Headache    History of kidney stones    History of nephrolithiasis 2015   via CT imaging, including an obstructing stone   Hyperlipidemia    Hypertension 12/2019   per patient, she has never been treated for high blood pressure   Low back pain 12/2019   Neuromuscular disorder    neuropathy    Past Surgical History:  Procedure Laterality Date   CHOLECYSTECTOMY  1998   CYSTOSCOPY WITH STENT PLACEMENT Right 12/09/2019   Procedure: CYSTOSCOPY WITH STENT  PLACEMENT;  Surgeon: Riki Altes, MD;  Location: [facility] OR[facility];  Service: Urology;  Laterality: Right;   CYSTOSCOPY/RETROGRADE/URETEROSCOPY  12/29/2019   Procedure: CYSTOSCOPY/RETROGRADE/URETEROSCOPY;  Surgeon: Riki Altes, MD;  Location: [facility] OR[facility];  Service: Urology;;    Family History  Problem Relation Age of Onset   Hypertension Mother    Heart disease Mother    Kidney failure Father    Sleep apnea Sister    Pancreatic cancer Maternal Grandmother    Liver disease Maternal Grandfather    Other Paternal Grandmother        unknown medical history   Other Paternal Grandfather        unknown medical history   Diabetes Maternal Great-grandmother     Social History   Socioeconomic History   Marital status: Single     Spouse name: Not on file   Number of children: Not on file   Years of education: Not on file   Highest education level: Not on file  Occupational History   Occupation: not currently working  Tobacco Use   Smoking status: Never   Smokeless tobacco: Never  Vaping Use   Vaping Use: Never used  Substance and Sexual Activity   Alcohol use: No   Drug use: No   Sexual activity: Never  Other Topics Concern   Not on file  Social History Narrative   - Needs a blood sugar monitor       Patient said she is living with her mom and things are going ok. Mom provides for her food, transport, etc.       Patient wants to wait until next visit before any information is shared.    Social Determinants of Health   Financial Resource Strain: Low Risk  (11/06/2017)   Overall Financial Resource Strain (CARDIA)    Difficulty of Paying Living Expenses: Not very hard  Food Insecurity: No Food Insecurity (01/16/2022)   Hunger Vital Sign    Worried About Running Out of Food in the Last Year: Never true    Ran Out of Food in the Last Year: Never true  Transportation Needs: No Transportation Needs (01/16/2022)   PRAPARE - Administrator, Civil Service (Medical): No    Lack of Transportation (Non-Medical): No  Physical Activity: Inactive (11/06/2017)   Exercise Vital Sign    Days of Exercise per Week: 0 days    Minutes of Exercise per Session: 0 min  Stress: No Stress Concern Present (11/06/2017)   [employer] of Occupational Health - Occupational Stress Questionnaire    Feeling of Stress : Not at all  Social Connections: Unknown (11/06/2017)   Social Connection and Isolation Panel [NHANES]    Frequency of Communication with Friends and Family: More than three times a week    Frequency of Social Gatherings with Friends and Family: More than three times a week    Attends Religious Services: More than 4 times per year    Active Member of [employer] or Organizations: No    Attends Tax inspector Meetings: Never    Marital Status: Not on file  Intimate Partner Violence: Not At Risk (11/06/2017)   Humiliation, Afraid, Rape, and Kick questionnaire    Fear of Current or Ex-Partner: No    Emotionally Abused: No    Physically Abused: No    Sexually Abused: No    Review of Systems  Constitutional: Negative.   HENT: Negative.    Eyes: Negative.   Respiratory: Negative.  Cardiovascular: Negative.   Gastrointestinal: Negative.   Genitourinary: Negative.   Musculoskeletal: Negative.   Skin: Negative.  Negative for itching and rash.  Neurological:  Positive for headaches.  Endo/Heme/Allergies: Negative.   Psychiatric/Behavioral: Negative.          Objective    BP (!) 144/75 (BP Location: Right Arm, Patient Position: Sitting, Cuff Size: Large)   Pulse 66   Temp 98 F (36.7 C) (Oral)   Resp 16   Ht 5\' 8"  (1.727 m)   Wt 281 lb (127.5 kg)   LMP 09/29/2018 (Approximate)   SpO2 96%   BMI 42.73 kg/m   Physical Exam HENT:     Head: Normocephalic.     Nose: Nose normal.     Mouth/Throat:     Mouth: Mucous membranes are moist.  Eyes:     Pupils: Pupils are equal, round, and reactive to light.  Cardiovascular:     Rate and Rhythm: Normal rate and regular rhythm.  Pulmonary:     Breath sounds: Normal breath sounds.  Abdominal:     General: Bowel sounds are normal.     Palpations: Abdomen is soft.  Musculoskeletal:     Cervical back: Normal range of motion.  Neurological:     Mental Status: She is alert and oriented to person, place, and time.  Psychiatric:        Mood and Affect: Mood normal.         Assessment & Plan:   1. Type 2 diabetes mellitus with diabetic polyneuropathy, with long-term current use of insulin  Her diabetes is not under control, her HgbA1c was 8.5%  during the last visit and her goal should be less than 7%. She was educated to check her glucose three times daily prior to meal, record and bring log to follow up appointment.  She was encouraged to maintain low carb/non concentrated sweet diet and exercise as tolerated. She will continue current medication regimen. She will be follow  up in June. - POCT Glucose (CBG)  2. History of migraine headaches She complained  of headache behind her two eyes that comes on and off. She was started on Imitrex as needed and encouraged to continue OTC Ibuprofen as needed. She was also advised to go to the ED if the headache get worse. - SUMAtriptan (IMITREX) 50 MG tablet; Take 1 tablet (50 mg total) by mouth every 2 (two) hours as needed for migraine. Take 1 tablet(50 mg total) by mouth once a day  Dispense: 10 tablet; Refill: 0    Follow up in 2 months on 11/06/2022 in the clinic  Mickie Hillier, FNP

## 2022-09-06 NOTE — Patient Instructions (Signed)

## 2022-09-07 ENCOUNTER — Other Ambulatory Visit: Payer: Self-pay | Admitting: Gerontology

## 2022-09-07 ENCOUNTER — Other Ambulatory Visit: Payer: Self-pay

## 2022-09-07 ENCOUNTER — Other Ambulatory Visit: Payer: Self-pay | Admitting: Endocrinology

## 2022-09-09 ENCOUNTER — Other Ambulatory Visit: Payer: Self-pay

## 2022-09-10 ENCOUNTER — Other Ambulatory Visit: Payer: Self-pay

## 2022-09-11 ENCOUNTER — Other Ambulatory Visit: Payer: Self-pay

## 2022-09-24 ENCOUNTER — Other Ambulatory Visit: Payer: Self-pay

## 2022-09-27 ENCOUNTER — Other Ambulatory Visit: Payer: Self-pay | Admitting: *Deleted

## 2022-09-27 DIAGNOSIS — N200 Calculus of kidney: Secondary | ICD-10-CM

## 2022-09-28 ENCOUNTER — Other Ambulatory Visit: Payer: Self-pay

## 2022-09-28 ENCOUNTER — Ambulatory Visit: Payer: Self-pay | Admitting: Urology

## 2022-10-01 ENCOUNTER — Other Ambulatory Visit: Payer: Self-pay

## 2022-10-15 ENCOUNTER — Other Ambulatory Visit: Payer: Self-pay

## 2022-10-22 ENCOUNTER — Encounter: Payer: Self-pay | Admitting: Urology

## 2022-10-24 ENCOUNTER — Other Ambulatory Visit: Payer: Self-pay

## 2022-10-25 ENCOUNTER — Ambulatory Visit: Payer: Self-pay | Admitting: Gerontology

## 2022-10-25 VITALS — BP 120/77 | HR 72 | Temp 98.3°F | Resp 16 | Ht 67.0 in | Wt 280.5 lb

## 2022-10-25 DIAGNOSIS — E1142 Type 2 diabetes mellitus with diabetic polyneuropathy: Secondary | ICD-10-CM

## 2022-10-25 DIAGNOSIS — E1169 Type 2 diabetes mellitus with other specified complication: Secondary | ICD-10-CM

## 2022-10-25 DIAGNOSIS — Z794 Long term (current) use of insulin: Secondary | ICD-10-CM

## 2022-10-25 MED ORDER — EMPAGLIFLOZIN 10 MG PO TABS
10.0000 mg | ORAL_TABLET | Freq: Every day | ORAL | 0 refills | Status: DC
Start: 2022-10-25 — End: 2023-01-03
  Filled 2022-10-25 – 2022-11-12 (×2): qty 30, 30d supply, fill #0

## 2022-10-25 MED ORDER — SEMAGLUTIDE (1 MG/DOSE) 4 MG/3ML ~~LOC~~ SOPN
2.0000 mg | PEN_INJECTOR | SUBCUTANEOUS | 3 refills | Status: DC
Start: 2022-10-25 — End: 2023-01-03
  Filled 2022-10-25 – 2022-11-12 (×3): qty 3, 14d supply, fill #0
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
## 2022-10-25 NOTE — Patient Instructions (Signed)

## 2022-10-26 ENCOUNTER — Other Ambulatory Visit: Payer: Self-pay

## 2022-10-26 LAB — LIPID PANEL
Chol/HDL Ratio: 5.5 ratio — ABNORMAL HIGH (ref 0.0–4.4)
Cholesterol, Total: 181 mg/dL (ref 100–199)
HDL: 33 mg/dL — ABNORMAL LOW (ref >39)
LDL Chol Calc (NIH): 127 mg/dL — ABNORMAL HIGH (ref 0–99)
Triglycerides: 116 mg/dL (ref 0–149)
VLDL Cholesterol Cal: 21 mg/dL (ref 5–40)

## 2022-10-26 LAB — COMPREHENSIVE METABOLIC PANEL
ALT: 12 IU/L (ref 0–32)
AST: 14 IU/L (ref 0–40)
Albumin/Globulin Ratio: 1.3
Albumin: 4.6 g/dL (ref 3.9–4.9)
Alkaline Phosphatase: 145 IU/L — ABNORMAL HIGH (ref 44–121)
BUN/Creatinine Ratio: 17 (ref 9–23)
BUN: 14 mg/dL (ref 6–24)
Bilirubin Total: 0.4 mg/dL (ref 0.0–1.2)
CO2: 25 mmol/L (ref 20–29)
Calcium: 10.1 mg/dL (ref 8.7–10.2)
Chloride: 97 mmol/L (ref 96–106)
Creatinine, Ser: 0.81 mg/dL (ref 0.57–1.00)
Globulin, Total: 3.5 g/dL (ref 1.5–4.5)
Glucose: 123 mg/dL — ABNORMAL HIGH (ref 70–99)
Potassium: 4.1 mmol/L (ref 3.5–5.2)
Sodium: 138 mmol/L (ref 134–144)
Total Protein: 8.1 g/dL (ref 6.0–8.5)
eGFR: 89 mL/min/{1.73_m2} (ref >59)

## 2022-10-26 LAB — MICROALBUMIN / CREATININE URINE RATIO
Creatinine, Urine: 111.4 mg/dL
Microalb/Creat Ratio: 44 mg/g creat — ABNORMAL HIGH (ref 0–29)
Microalbumin, Urine: 49.5 ug/mL

## 2022-10-28 NOTE — Progress Notes (Signed)
Pt Cancelled appt

## 2022-10-29 ENCOUNTER — Other Ambulatory Visit: Payer: Self-pay

## 2022-10-31 ENCOUNTER — Other Ambulatory Visit: Payer: Self-pay

## 2022-11-08 ENCOUNTER — Ambulatory Visit: Payer: Self-pay | Admitting: Gerontology

## 2022-11-12 ENCOUNTER — Other Ambulatory Visit: Payer: Self-pay | Admitting: Urology

## 2022-11-12 ENCOUNTER — Other Ambulatory Visit: Payer: Self-pay

## 2022-11-12 ENCOUNTER — Other Ambulatory Visit: Payer: Self-pay | Admitting: Gerontology

## 2022-11-12 DIAGNOSIS — I10 Essential (primary) hypertension: Secondary | ICD-10-CM

## 2022-11-13 ENCOUNTER — Other Ambulatory Visit: Payer: Self-pay

## 2022-11-13 ENCOUNTER — Other Ambulatory Visit: Payer: Self-pay | Admitting: Gerontology

## 2022-11-13 DIAGNOSIS — I10 Essential (primary) hypertension: Secondary | ICD-10-CM

## 2022-11-14 ENCOUNTER — Other Ambulatory Visit: Payer: Self-pay | Admitting: Urology

## 2022-11-15 ENCOUNTER — Other Ambulatory Visit: Payer: Self-pay

## 2022-11-15 MED ORDER — POTASSIUM CITRATE ER 10 MEQ (1080 MG) PO TBCR
10.0000 meq | EXTENDED_RELEASE_TABLET | Freq: Two times a day (BID) | ORAL | 0 refills | Status: DC
  Filled 2022-11-15: qty 41, 21d supply, fill #0
  Filled 2022-11-16: qty 19, 9d supply, fill #0

## 2022-11-16 ENCOUNTER — Other Ambulatory Visit: Payer: Self-pay

## 2022-11-16 NOTE — Telephone Encounter (Signed)
Left message to call back to schedule.

## 2022-11-19 ENCOUNTER — Other Ambulatory Visit: Payer: Self-pay

## 2022-11-19 ENCOUNTER — Encounter: Payer: Self-pay | Admitting: *Deleted

## 2022-11-20 ENCOUNTER — Other Ambulatory Visit: Payer: Self-pay

## 2022-11-20 MED FILL — Metoprolol Succinate Tab ER 24HR 25 MG (Tartrate Equiv): ORAL | 90 days supply | Qty: 90 | Fill #0 | Status: CN

## 2022-11-22 ENCOUNTER — Ambulatory Visit: Payer: Self-pay | Admitting: Gerontology

## 2022-11-22 ENCOUNTER — Other Ambulatory Visit: Payer: Self-pay

## 2022-11-23 ENCOUNTER — Other Ambulatory Visit: Payer: Self-pay

## 2022-11-27 ENCOUNTER — Other Ambulatory Visit: Payer: Self-pay

## 2022-11-27 MED ORDER — NOVOFINE PEN NEEDLE 32G X 6 MM MISC
1.0000 | 3 refills | Status: AC
  Filled 2023-01-10: qty 100, 100d supply, fill #0
  Filled 2023-06-27: qty 400, 100d supply, fill #1
  Filled 2023-10-20: qty 100, 100d supply, fill #1

## 2022-11-27 MED ORDER — OZEMPIC (2 MG/DOSE) 8 MG/3ML ~~LOC~~ SOPN
2.0000 mg | PEN_INJECTOR | SUBCUTANEOUS | 3 refills | Status: DC
  Filled 2022-12-12: qty 12, 112d supply, fill #0

## 2022-11-28 ENCOUNTER — Other Ambulatory Visit: Payer: Self-pay

## 2022-12-12 ENCOUNTER — Other Ambulatory Visit: Payer: Self-pay

## 2022-12-17 ENCOUNTER — Other Ambulatory Visit: Payer: Self-pay

## 2022-12-17 MED FILL — Metoprolol Succinate Tab ER 24HR 25 MG (Tartrate Equiv): ORAL | 90 days supply | Qty: 90 | Fill #0 | Status: AC

## 2022-12-21 ENCOUNTER — Other Ambulatory Visit: Payer: Self-pay

## 2022-12-24 ENCOUNTER — Other Ambulatory Visit: Payer: Self-pay

## 2022-12-26 ENCOUNTER — Other Ambulatory Visit: Payer: Self-pay | Admitting: Gerontology

## 2023-01-03 ENCOUNTER — Ambulatory Visit: Payer: Self-pay | Admitting: Gerontology

## 2023-01-03 VITALS — BP 135/78 | HR 76 | Temp 98.5°F | Ht 66.93 in | Wt 278.1 lb

## 2023-01-03 DIAGNOSIS — I10 Essential (primary) hypertension: Secondary | ICD-10-CM

## 2023-01-03 DIAGNOSIS — Z794 Long term (current) use of insulin: Secondary | ICD-10-CM

## 2023-01-03 LAB — POCT GLYCOSYLATED HEMOGLOBIN (HGB A1C): Hemoglobin A1C: 8.5 % — AB (ref 4.0–5.6)

## 2023-01-03 LAB — GLUCOSE, POCT (MANUAL RESULT ENTRY): POC Glucose: 169 mg/dl — AB (ref 70–99)

## 2023-01-03 MED ORDER — OZEMPIC (0.25 OR 0.5 MG/DOSE) 2 MG/1.5ML ~~LOC~~ SOPN
0.2500 mg | PEN_INJECTOR | SUBCUTANEOUS | 0 refills | Status: DC
Start: 2023-01-03 — End: 2023-03-19
  Filled 2023-01-03: qty 3, 56d supply, fill #0

## 2023-01-03 MED ORDER — EMPAGLIFLOZIN 10 MG PO TABS
10.0000 mg | ORAL_TABLET | Freq: Every day | ORAL | 0 refills | Status: DC
Start: 2023-01-03 — End: 2023-09-12
  Filled 2023-01-03 – 2023-06-27 (×8): qty 30, 30d supply, fill #0

## 2023-01-03 MED ORDER — LISINOPRIL 5 MG PO TABS
5.0000 mg | ORAL_TABLET | Freq: Every day | ORAL | 0 refills | Status: DC
Start: 2023-01-03 — End: 2023-02-07
  Filled 2023-01-03: qty 30, 30d supply, fill #0

## 2023-01-03 MED ORDER — SEMAGLUTIDE (1 MG/DOSE) 4 MG/3ML ~~LOC~~ SOPN
0.5000 mg | PEN_INJECTOR | SUBCUTANEOUS | 0 refills | Status: DC
Start: 2023-01-03 — End: 2023-02-28
  Filled 2023-01-03 – 2023-02-24 (×4): qty 3, 56d supply, fill #0

## 2023-01-03 MED ORDER — SEMAGLUTIDE (1 MG/DOSE) 4 MG/3ML ~~LOC~~ SOPN
1.0000 mg | PEN_INJECTOR | SUBCUTANEOUS | 0 refills | Status: DC
Start: 2023-01-03 — End: 2023-09-12
  Filled 2023-01-03 – 2023-02-28 (×2): qty 3, 28d supply, fill #0
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
## 2023-01-03 NOTE — Patient Instructions (Signed)

## 2023-01-04 ENCOUNTER — Other Ambulatory Visit: Payer: Self-pay

## 2023-01-10 ENCOUNTER — Other Ambulatory Visit: Payer: Self-pay | Admitting: Endocrinology

## 2023-01-10 ENCOUNTER — Other Ambulatory Visit: Payer: Self-pay

## 2023-01-10 DIAGNOSIS — Z794 Long term (current) use of insulin: Secondary | ICD-10-CM

## 2023-01-11 ENCOUNTER — Other Ambulatory Visit: Payer: Self-pay

## 2023-01-11 ENCOUNTER — Other Ambulatory Visit: Payer: Self-pay | Admitting: Gerontology

## 2023-01-11 DIAGNOSIS — Z794 Long term (current) use of insulin: Secondary | ICD-10-CM

## 2023-01-13 ENCOUNTER — Other Ambulatory Visit: Payer: Self-pay | Admitting: Urology

## 2023-01-15 ENCOUNTER — Other Ambulatory Visit: Payer: Self-pay

## 2023-01-15 MED FILL — Insulin Glargine Soln Pen-Injector 100 Unit/ML: SUBCUTANEOUS | 93 days supply | Qty: 75 | Fill #0 | Status: AC

## 2023-01-21 ENCOUNTER — Other Ambulatory Visit: Payer: Self-pay | Admitting: Urology

## 2023-01-21 ENCOUNTER — Other Ambulatory Visit: Payer: Self-pay

## 2023-01-21 MED ORDER — POTASSIUM CITRATE ER 10 MEQ (1080 MG) PO TBCR
10.0000 meq | EXTENDED_RELEASE_TABLET | Freq: Two times a day (BID) | ORAL | 0 refills | Status: DC
  Filled 2023-01-21: qty 60, 30d supply, fill #0

## 2023-01-24 ENCOUNTER — Ambulatory Visit: Payer: Self-pay

## 2023-01-31 ENCOUNTER — Ambulatory Visit: Payer: Self-pay

## 2023-02-07 ENCOUNTER — Ambulatory Visit: Payer: Self-pay | Admitting: Gerontology

## 2023-02-07 ENCOUNTER — Encounter: Payer: Self-pay | Admitting: Gerontology

## 2023-02-07 ENCOUNTER — Other Ambulatory Visit: Payer: Self-pay

## 2023-02-07 VITALS — BP 137/75 | HR 60 | Temp 98.1°F | Resp 16 | Wt 275.0 lb

## 2023-02-07 DIAGNOSIS — I10 Essential (primary) hypertension: Secondary | ICD-10-CM

## 2023-02-07 DIAGNOSIS — E1142 Type 2 diabetes mellitus with diabetic polyneuropathy: Secondary | ICD-10-CM

## 2023-02-07 MED ORDER — LISINOPRIL 5 MG PO TABS
5.0000 mg | ORAL_TABLET | Freq: Every day | ORAL | 3 refills | Status: DC
Start: 2023-02-07 — End: 2023-07-16
  Filled 2023-02-07 – 2023-02-24 (×2): qty 30, 30d supply, fill #0
  Filled 2023-04-07: qty 90, 90d supply, fill #1

## 2023-02-07 NOTE — Patient Instructions (Signed)
Chronic Pain, Adult Chronic pain is a type of pain that lasts or keeps coming back for at least 3-6 months. You may have headaches, pain in the abdomen, or pain in other areas of the body. Chronic pain may be related to an illness, injury, or a health condition. Sometimes, the cause of chronic pain is not known. Chronic pain can make it hard for you to do daily activities. If it is not treated, chronic pain can lead to anxiety and depression. Treatment depends on the cause of your pain and how severe it is. You may need to work with a pain specialist to come up with a treatment plan. Many people benefit from two or more types of treatment to control their pain. Follow these instructions at home: Treatment plan Follow your treatment plan as told by your health care provider. This may include: Gentle, regular exercise. Eating a healthy diet that includes foods such as vegetables, fruits, fish, and lean meats. Mental health therapy (cognitive or behavioral therapy) that changes the way you think or act in response to the pain. This may help improve how you feel. Doing physical therapy exercises to improve movement and strength. Meditation, yoga, acupuncture, or massage therapy. Using the oils from plants in your environment or on your skin (aromatherapy). Other treatments may include: Over-the-counter or prescription medicines. Color, light, or sound therapy. Local electrical stimulation. The electrical pulses help to relieve pain by temporarily stopping the nerve impulses that cause you to feel pain. Injections. These deliver numbing or pain-relieving medicines into the spine or the area of pain.  Medicines Take over-the-counter and prescription medicines only as told by your health care provider. Ask your health care provider if the medicine prescribed to you: Requires you to avoid driving or using machinery. Can cause constipation. You may need to take these actions to prevent or treat  constipation: Drink enough fluid to keep your urine pale yellow. Take over-the-counter or prescription medicines. Eat foods that are high in fiber, such as beans, whole grains, and fresh fruits and vegetables. Limit foods that are high in fat and processed sugars, such as fried or sweet foods. Lifestyle  Ask your health care provider whether you should keep a pain diary. Your health care provider will tell you what information to write in the diary. This may include: When you have pain. What the pain feels like. How medicines and other behaviors or treatments help to reduce the pain. Consider talking with a mental health care provider about how to help manage chronic pain. Consider joining a chronic pain support group. Try to control or lower your stress levels. Talk with your health care provider about ways to do this. General instructions Learn as much as you can about how to manage your chronic pain. Ask your health care provider if an intensive pain rehabilitation program or a chronic pain specialist would be helpful. Check your pain level as told by your health care provider. Ask your health care provider if you should use a pain scale. Contact a health care provider if: Your pain is not controlled with treatment. You have new pain. You have side effects from pain medicine. You feel weak or you have trouble doing your normal activities. You have trouble sleeping or you develop confusion. You lose feeling or have numbness in your body. You lose control of your bowels or bladder. Get help right away if: Your pain suddenly gets much worse. You develop chest pain. You have trouble breathing or shortness of  breath. You faint, or another person sees you faint. These symptoms may be an emergency. Get help right away. Call 911. Do not wait to see if the symptoms will go away. Do not drive yourself to the hospital. Also, get help right away if: You have thoughts about hurting yourself  or others. Take one of these steps if you feel like you may hurt yourself or others, or have thoughts about taking your own life: Go to your nearest emergency room. Call 911. Call the National Suicide Prevention Lifeline at 364-322-5560 or 988. This is open 24 hours a day. Text the Crisis Text Line at 339-649-7183. This information is not intended to replace advice given to you by your health care provider. Make sure you discuss any questions you have with your health care provider. Document Revised: 12/20/2021 Document Reviewed: 11/22/2021 Elsevier Patient Education  2024 Else[employer] Inc. Diabetes Mellitus and Nutrition, Adult When you have diabetes, or diabetes mellitus, it is very important to have healthy eating habits because your blood sugar (glucose) levels are greatly affected by what you eat and drink. Eating healthy foods in the right amounts, at about the same times every day, can help you: Manage your blood glucose. Lower your risk of heart disease. Improve your blood pressure. Reach or maintain a healthy weight. What can affect my meal plan? Every person with diabetes is different, and each person has different needs for a meal plan. Your health care provider may recommend that you work with a dietitian to make a meal plan that is best for you. Your meal plan may vary depending on factors such as: The calories you need. The medicines you take. Your weight. Your blood glucose, blood pressure, and cholesterol levels. Your activity level. Other health conditions you have, such as heart or kidney disease. How do carbohydrates affect me? Carbohydrates, also called carbs, affect your blood glucose level more than any other type of food. Eating carbs raises the amount of glucose in your blood. It is important to know how many carbs you can safely have in each meal. This is different for every person. Your dietitian can help you calculate how many carbs you should have at each meal and for  each snack. How does alcohol affect me? Alcohol can cause a decrease in blood glucose (hypoglycemia), especially if you use insulin or take certain diabetes medicines by mouth. Hypoglycemia can be a life-threatening condition. Symptoms of hypoglycemia, such as sleepiness, dizziness, and confusion, are similar to symptoms of having too much alcohol. Do not drink alcohol if: Your health care provider tells you not to drink. You are pregnant, may be pregnant, or are planning to become pregnant. If you drink alcohol: Limit how much you have to: 0-1 drink a day for women. 0-2 drinks a day for men. Know how much alcohol is in your drink. In the [place] one drink equals one 12 oz bottle of beer (355 mL), one 5 oz glass of wine (148 mL), or one 1 oz glass of hard liquor (44 mL). Keep yourself hydrated with water, diet soda, or unsweetened iced tea. Keep in mind that regular soda, juice, and other mixers may contain a lot of sugar and must be counted as carbs. What are tips for following this plan?  Reading food labels Start by checking the serving size on the Nutrition Facts label of packaged foods and drinks. The number of calories and the amount of carbs, fats, and other nutrients listed on the label are based on  one serving of the item. Many items contain more than one serving per package. Check the total grams (g) of carbs in one serving. Check the number of grams of saturated fats and trans fats in one serving. Choose foods that have a low amount or none of these fats. Check the number of milligrams (mg) of salt (sodium) in one serving. Most people should limit total sodium intake to less than 2,300 mg per day. Always check the nutrition information of foods labeled as "low-fat" or "nonfat." These foods may be higher in added sugar or refined carbs and should be avoided. Talk to your dietitian to identify your daily goals for nutrients listed on the label. Shopping Avoid buying canned, pre-made, or  processed foods. These foods tend to be high in fat, sodium, and added sugar. Shop around the outside edge of the grocery store. This is where you will most often find fresh fruits and vegetables, bulk grains, fresh meats, and fresh dairy products. Cooking Use low-heat cooking methods, such as baking, instead of high-heat cooking methods, such as deep frying. Cook using healthy oils, such as olive, canola, or sunflower oil. Avoid cooking with butter, cream, or high-fat meats. Meal planning Eat meals and snacks regularly, preferably at the same times every day. Avoid going long periods of time without eating. Eat foods that are high in fiber, such as fresh fruits, vegetables, beans, and whole grains. Eat 4-6 oz (112-168 g) of lean protein each day, such as lean meat, chicken, fish, eggs, or tofu. One ounce (oz) (28 g) of lean protein is equal to: 1 oz (28 g) of meat, chicken, or fish. 1 egg.  cup (62 g) of tofu. Eat some foods each day that contain healthy fats, such as avocado, nuts, seeds, and fish. What foods should I eat? Fruits Berries. Apples. Oranges. Peaches. Apricots. Plums. Grapes. Mangoes. Papayas. Pomegranates. Kiwi. Cherries. Vegetables Leafy greens, including lettuce, spinach, kale, chard, collard greens, mustard greens, and cabbage. Beets. Cauliflower. Broccoli. Carrots. Green beans. Tomatoes. Peppers. Onions. Cucumbers. Brussels sprouts. Grains Whole grains, such as whole-wheat or whole-grain bread, crackers, tortillas, cereal, and pasta. Unsweetened oatmeal. Quinoa. Brown or wild rice. Meats and other proteins Seafood. Poultry without skin. Lean cuts of poultry and beef. Tofu. Nuts. Seeds. Dairy Low-fat or fat-free dairy products such as milk, yogurt, and cheese. The items listed above may not be a complete list of foods and beverages you can eat and drink. Contact a dietitian for more information. What foods should I avoid? Fruits Fruits canned with  syrup. Vegetables Canned vegetables. Frozen vegetables with butter or cream sauce. Grains Refined white flour and flour products such as bread, pasta, snack foods, and cereals. Avoid all processed foods. Meats and other proteins Fatty cuts of meat. Poultry with skin. Breaded or fried meats. Processed meat. Avoid saturated fats. Dairy Full-fat yogurt, cheese, or milk. Beverages Sweetened drinks, such as soda or iced tea. The items listed above may not be a complete list of foods and beverages you should avoid. Contact a dietitian for more information. Questions to ask a health care provider Do I need to meet with a certified diabetes care and education specialist? Do I need to meet with a dietitian? What number can I call if I have questions? When are the best times to check my blood glucose? Where to find more information: American Diabetes Association: diabetes.org Academy of Nutrition and Dietetics: eatright.[employer] of Diabetes and Digestive and Kidney Diseases: StageSync.si Association of Diabetes Care & Education  Specialists: diabeteseducator.org Summary It is important to have healthy eating habits because your blood sugar (glucose) levels are greatly affected by what you eat and drink. It is important to use alcohol carefully. A healthy meal plan will help you manage your blood glucose and lower your risk of heart disease. Your health care provider may recommend that you work with a dietitian to make a meal plan that is best for you. This information is not intended to replace advice given to you by your health care provider. Make sure you discuss any questions you have with your health care provider. Document Revised: 12/02/2019 Document Reviewed: 12/02/2019 Elsevier Patient Education  2024 [employer].

## 2023-02-07 NOTE — Progress Notes (Signed)
Established Patient Office Visit  Subjective   Patient ID: [name], female    DOB: 25-Aug-1975  Age: 47 y.o. MRN: [id number]  No chief complaint on file.   HPI  [name] is a 47 y.o. with a history of depression, diabetes, GERD, Headache, Kidney stones, nephrolithiasis, Hyperlipidemia, Hypertension, low back pain and neuromuscular disorder female who is seen in follow up for Type 2 diabetes mellitus and ongoing medication. Last HgbA1c  8.5% 01/03/23 with  8.5% on 08/09/22.  Denies any form of exercises and pt checks her blood glucose 3 X daily.  She states that her readings ranges between  93-110 mg/dL but did not bring her log. Her blood glucose was 76 mg/dl during visit.  She denies hypo-hyperglycemia symptoms. She reports that she's compliant with taking her Ozempic 0.25 mg with no reported issues. She does not wish to increase on her Ozempic. Complaint today of ongoing back pain (history). With today 6/10 pain score.  Occurs off and on  with some relief with OTC Joint and Pain ointment. Reports a delay with PT and steroid injections due to her early arthritis however delayed clearance with paperwork with [facility] currently awaiting a call back when cleared. Overall, she states that she is "bored  and offers no further complaints.   Review of Systems  Constitutional: Negative.   HENT: Negative.    Eyes: Negative.   Respiratory: Negative.    Cardiovascular: Negative.   Gastrointestinal: Negative.   Genitourinary: Negative.   Musculoskeletal: Negative.   Skin: Negative.   Neurological: Negative.   Endo/Heme/Allergies: Negative.   Psychiatric/Behavioral: Negative.        Objective:     BP 137/75 (BP Location: Left Arm, Patient Position: Sitting, Cuff Size: Large)   Pulse 60   Temp 98.1 F (36.7 C) (Temporal)   Resp 16   Wt 275 lb (124.7 kg)   LMP 09/29/2018 (Approximate)   SpO2 95%   BMI 43.16 kg/m  BP Readings from Last 3 Encounters:  02/07/23  137/75  01/03/23 135/78  10/25/22 120/77   Wt Readings from Last 3 Encounters:  02/07/23 275 lb (124.7 kg)  01/03/23 278 lb 1.6 oz (126.1 kg)  10/25/22 280 lb 8 oz (127.2 kg)      Physical Exam Constitutional:      Appearance: Normal appearance. She is obese.  HENT:     Right Ear: Tympanic membrane normal.     Left Ear: Tympanic membrane normal.     Nose: Nose normal.     Mouth/Throat:     Mouth: Mucous membranes are moist.  Eyes:     Extraocular Movements: Extraocular movements intact.  Cardiovascular:     Rate and Rhythm: Normal rate and regular rhythm.  Pulmonary:     Effort: Pulmonary effort is normal.     Breath sounds: Normal breath sounds.  Abdominal:     General: Bowel sounds are normal.     Palpations: Abdomen is soft.  Musculoskeletal:        General: Normal range of motion.     Cervical back: Normal range of motion.  Skin:    General: Skin is warm and dry.  Neurological:     General: No focal deficit present.     Mental Status: She is alert and oriented to person, place, and time.  Psychiatric:        Mood and Affect: Mood normal.        Behavior: Behavior normal.  Thought Content: Thought content normal.        Judgment: Judgment normal.      No results found for any visits on 02/07/23.  Last CBC Lab Results  Component Value Date   WBC 7.6 01/16/2022   HGB 11.4 01/16/2022   HCT 35.2 01/16/2022   MCV 76 (L) 01/16/2022   MCH 24.6 (L) 01/16/2022   RDW 15.3 01/16/2022   PLT 318 01/16/2022   Last metabolic panel Lab Results  Component Value Date   GLUCOSE 123 (H) 10/25/2022   NA 138 10/25/2022   K 4.1 10/25/2022   CL 97 10/25/2022   CO2 25 10/25/2022   BUN 14 10/25/2022   CREATININE 0.81 10/25/2022   EGFR 89 10/25/2022   CALCIUM 10.1 10/25/2022   PHOS 4.3 08/31/2020   PROT 8.1 10/25/2022   ALBUMIN 4.6 10/25/2022   LABGLOB 3.5 10/25/2022   AGRATIO 1.3 10/25/2022   BILITOT 0.4 10/25/2022   ALKPHOS 145 (H) 10/25/2022   AST 14  10/25/2022   ALT 12 10/25/2022   ANIONGAP 10 01/02/2022   Last lipids Lab Results  Component Value Date   CHOL 181 10/25/2022   HDL 33 (L) 10/25/2022   LDLCALC 127 (H) 10/25/2022   TRIG 116 10/25/2022   CHOLHDL 5.5 (H) 10/25/2022   Last hemoglobin A1c Lab Results  Component Value Date   HGBA1C 8.5 (A) 01/03/2023   Last thyroid functions Lab Results  Component Value Date   TSH 1.990 01/27/2021   T4TOTAL 5.9 10/27/2015   Last vitamin D No results found for: "25OHVITD2", "25OHVITD3", "VD25OH" Last vitamin B12 and Folate Lab Results  Component Value Date   VITAMINB12 410 01/16/2022   FOLATE 7.7 01/16/2022      The 10-year ASCVD risk score (Arnett DK, et al., 2019) is: 18.4%    Assessment & Plan:  1. Essential hypertension -Her blood pressure is under control, she continue to take her current medication and she was encouraged to continue on DASH diet  and exercise as tolerated. - lisinopril (ZESTRIL) 5 MG tablet; Take 1 tablet (5 mg total) by mouth daily.  Dispense: 30 tablet; Refill: 3  2. Type 2 diabetes mellitus with diabetic polyneuropathy, with long-term current use of insulin (HCC) -Her diabetes is not controlled, her HgbA1c was 8.5% and her goal should be less than 7%. She will continue to check her fasting blood glucose, record and bring log to follow up appointment. She was encouraged to continue on low carb/non concentrated sweet diet.      Follow up in clinic 04/04/2023.   Chioma Trellis Paganini, NP

## 2023-02-10 ENCOUNTER — Other Ambulatory Visit: Payer: Self-pay | Admitting: Gerontology

## 2023-02-10 ENCOUNTER — Other Ambulatory Visit: Payer: Self-pay

## 2023-02-10 DIAGNOSIS — E1142 Type 2 diabetes mellitus with diabetic polyneuropathy: Secondary | ICD-10-CM

## 2023-02-12 ENCOUNTER — Other Ambulatory Visit: Payer: Self-pay

## 2023-02-12 ENCOUNTER — Other Ambulatory Visit: Payer: Self-pay | Admitting: Gerontology

## 2023-02-12 DIAGNOSIS — E1142 Type 2 diabetes mellitus with diabetic polyneuropathy: Secondary | ICD-10-CM

## 2023-02-13 ENCOUNTER — Other Ambulatory Visit: Payer: Self-pay

## 2023-02-14 ENCOUNTER — Other Ambulatory Visit: Payer: Self-pay

## 2023-02-17 ENCOUNTER — Other Ambulatory Visit: Payer: Self-pay | Admitting: Urology

## 2023-02-17 ENCOUNTER — Other Ambulatory Visit: Payer: Self-pay

## 2023-02-18 ENCOUNTER — Other Ambulatory Visit: Payer: Self-pay

## 2023-02-19 ENCOUNTER — Other Ambulatory Visit: Payer: Self-pay

## 2023-02-19 MED ORDER — POTASSIUM CITRATE ER 10 MEQ (1080 MG) PO TBCR
10.0000 meq | EXTENDED_RELEASE_TABLET | Freq: Two times a day (BID) | ORAL | 0 refills | Status: DC
  Filled 2023-02-19: qty 60, 30d supply, fill #0

## 2023-02-20 ENCOUNTER — Other Ambulatory Visit: Payer: Self-pay | Admitting: *Deleted

## 2023-02-20 ENCOUNTER — Encounter: Payer: Self-pay | Admitting: *Deleted

## 2023-02-20 DIAGNOSIS — N200 Calculus of kidney: Secondary | ICD-10-CM

## 2023-02-21 ENCOUNTER — Other Ambulatory Visit: Payer: Self-pay

## 2023-02-22 ENCOUNTER — Other Ambulatory Visit: Payer: Self-pay

## 2023-02-24 ENCOUNTER — Other Ambulatory Visit: Payer: Self-pay

## 2023-02-24 ENCOUNTER — Other Ambulatory Visit: Payer: Self-pay | Admitting: Gerontology

## 2023-02-24 ENCOUNTER — Other Ambulatory Visit: Payer: Self-pay | Admitting: Endocrinology

## 2023-02-24 DIAGNOSIS — E1165 Type 2 diabetes mellitus with hyperglycemia: Secondary | ICD-10-CM

## 2023-02-24 DIAGNOSIS — Z794 Long term (current) use of insulin: Secondary | ICD-10-CM

## 2023-02-25 ENCOUNTER — Other Ambulatory Visit: Payer: Self-pay

## 2023-02-26 ENCOUNTER — Other Ambulatory Visit: Payer: Self-pay

## 2023-02-26 MED FILL — "Insulin Pen Needle 32 G X 6 MM (1/4"" or 15/64"")": Qty: 400 | Fill #0 | Status: CN

## 2023-02-27 ENCOUNTER — Other Ambulatory Visit: Payer: Self-pay

## 2023-02-28 ENCOUNTER — Other Ambulatory Visit: Payer: Self-pay

## 2023-03-01 ENCOUNTER — Other Ambulatory Visit: Payer: Self-pay

## 2023-03-04 ENCOUNTER — Other Ambulatory Visit: Payer: Self-pay

## 2023-03-09 MED FILL — "Insulin Pen Needle 32 G X 6 MM (1/4"" or 15/64"")": Qty: 400 | Fill #0 | Status: CN

## 2023-03-10 ENCOUNTER — Other Ambulatory Visit: Payer: Self-pay

## 2023-03-11 ENCOUNTER — Other Ambulatory Visit: Payer: Self-pay

## 2023-03-13 ENCOUNTER — Other Ambulatory Visit: Payer: Self-pay

## 2023-03-14 ENCOUNTER — Other Ambulatory Visit: Payer: Self-pay

## 2023-03-15 ENCOUNTER — Other Ambulatory Visit: Payer: Self-pay

## 2023-03-17 ENCOUNTER — Other Ambulatory Visit: Payer: Self-pay

## 2023-03-19 ENCOUNTER — Other Ambulatory Visit: Payer: Self-pay

## 2023-03-19 MED FILL — Semaglutide Soln Pen-inj 0.25 or 0.5 MG/DOSE (2 MG/1.5ML): SUBCUTANEOUS | 56 days supply | Qty: 1.5 | Fill #0 | Status: CN

## 2023-03-25 NOTE — Progress Notes (Unsigned)
03/28/2023 9:37 AM   [name] [name] [birth year] [id number]  Referring provider: Rolm Gala, NP [address] [place],  [place] [address]  Urological history: 1.  Nephrolithiasis -Metabolic evaluation with hyperoxaluria, low urine pH and hyperuricosuria  -right URS (2021)  -Potassium citrate 10 mEq twice daily  Chief Complaint  Patient presents with   Nephrolithiasis   HPI: [name] is a 47 y.o. female who presents today for follow up.   Previous records reviewed.   She has not had any episodes of renal colic or passage of fragments.  Patient denies any modifying or aggravating factors.  Patient denies any recent UTI's, gross hematuria, dysuria or suprapubic/flank pain.  Patient denies any fevers, chills, nausea or vomiting.    She continues to take Urocit-K 10 mEq twice daily and is tolerating it well.  KUB 11 mm right lower pole stone and a 4 mm left lower pole stone.   PMH: Past Medical History:  Diagnosis Date   Depression    Diabetes mellitus without complication (HCC)    GERD (gastroesophageal reflux disease)    Headache    History of kidney stones    History of nephrolithiasis 2015   via CT imaging, including an obstructing stone   Hyperlipidemia    Hypertension 12/2019   per patient, she has never been treated for high blood pressure   Low back pain 12/2019   Neuromuscular disorder (HCC)    neuropathy    Surgical History: Past Surgical History:  Procedure Laterality Date   CHOLECYSTECTOMY  1998   CYSTOSCOPY WITH STENT PLACEMENT Right 12/09/2019   Procedure: CYSTOSCOPY WITH STENT PLACEMENT;  Surgeon: Hannah Altes, MD;  Location: [facility];  Service: Urology;  Laterality: Right;   CYSTOSCOPY/RETROGRADE/URETEROSCOPY  12/29/2019   Procedure: CYSTOSCOPY/RETROGRADE/URETEROSCOPY;  Surgeon: Hannah Altes, MD;  Location: [facility];  Service: Urology;;    Home Medications:  Allergies as of 03/28/2023       Reactions    Penicillins Hives   Raised welts   Topiramate    Other reaction(s): Other (See Comments) Worsened migraines, N/V   Topiramate Er Nausea And Vomiting   Intensified Migraines   Valtrex [valacyclovir] Itching   Diarrhea, abdominal pain, and possibly itching Does not remember timing        Medication List        Accurate as of March 28, 2023  9:37 AM. If you have any questions, ask your nurse or doctor.          Comfort EZ Pen Needles 31G X 6 MM Misc Generic drug: Insulin Pen Needle use with insulin pens   Novofine Pen Needle 32G X 6 MM Misc Generic drug: Insulin Pen Needle Inject 1 Needle into the skin as directed.   Novofine Pen Needle 32G X 6 MM Misc Generic drug: Insulin Pen Needle Use as directed with novolog flexpen   empagliflozin 10 MG Tabs tablet Commonly known as: Jardiance Take 1 tablet (10 mg total) by mouth daily before breakfast.   Lantus SoloStar 100 UNIT/ML Solostar Pen Generic drug: insulin glargine Inject 80 Units into the skin once daily at bedtime.   lisinopril 5 MG tablet Commonly known as: ZESTRIL Take 1 tablet (5 mg total) by mouth daily.   methocarbamol 500 MG tablet Commonly known as: ROBAXIN Take 1 tablet (500 mg total) by mouth every 8 (eight) hours as needed for muscle spasms.   metoprolol succinate 25 MG 24 hr tablet Commonly known as: TOPROL-XL  Take 1 tablet (25 mg total) by mouth once daily.   Ozempic (1 MG/DOSE) 4 MG/3ML Sopn Generic drug: Semaglutide (1 MG/DOSE) Inject 1 mg into the skin once a week. Start 1 mg on the 9th week   Ozempic (0.25 or 0.5 MG/DOSE) 2 MG/1.5ML Sopn Generic drug: Semaglutide(0.25 or 0.5MG /DOS) Inject 0.25 mg into the skin once a week.   potassium citrate 10 MEQ (1080 MG) SR tablet Commonly known as: UROCIT-K Take 1 tablet (10 mEq total) by mouth 2 (two) times daily with a meal.   SUMAtriptan 50 MG tablet Commonly known as: Imitrex Take 1 tablet (50 mg total) by mouth every 2 (two) hours  as needed for migraine. Take 1 tablet(50 mg total) by mouth once a day        Allergies:  Allergies  Allergen Reactions   Penicillins Hives    Raised welts   Topiramate     Other reaction(s): Other (See Comments) Worsened migraines, N/V   Topiramate Er Nausea And Vomiting    Intensified Migraines    Valtrex [Valacyclovir] Itching    Diarrhea, abdominal pain, and possibly itching  Does not remember timing    Family History: Family History  Problem Relation Age of Onset   Hypertension Mother    Heart disease Mother    Kidney failure Father    Sleep apnea Sister    Pancreatic cancer Maternal Grandmother    Liver disease Maternal Grandfather    Other Paternal Grandmother        unknown medical history   Other Paternal Grandfather        unknown medical history   Diabetes Maternal Great-grandmother     Social History:  reports that she has never smoked. She has never used smokeless tobacco. She reports that she does not drink alcohol and does not use drugs.  ROS: Pertinent ROS in HPI  Physical Exam: BP 123/69   Pulse 83   LMP 09/29/2018 (Approximate)   Constitutional:  Well nourished. Alert and oriented, No acute distress. HEENT: [place] AT, moist mucus membranes.  Trachea midline Cardiovascular: No clubbing, cyanosis, or edema. Respiratory: Normal respiratory effort, no increased work of breathing. Neurologic: Grossly intact, no focal deficits, moving all 4 extremities. Psychiatric: Normal mood and affect.    Laboratory Data: CMP     Component Value Date/Time   NA 138 10/25/2022 1919   NA 132 (L) 06/06/2014 1433   K 4.1 10/25/2022 1919   K 3.7 06/06/2014 1433   CL 97 10/25/2022 1919   CL 96 (L) 06/06/2014 1433   CO2 25 10/25/2022 1919   CO2 29 06/06/2014 1433   GLUCOSE 123 (H) 10/25/2022 1919   GLUCOSE 141 (H) 01/02/2022 1359   GLUCOSE 307 (H) 06/06/2014 1433   BUN 14 10/25/2022 1919   BUN 12 06/06/2014 1433   CREATININE 0.81 10/25/2022 1919    CREATININE 1.32 (H) 06/06/2014 1433   CALCIUM 10.1 10/25/2022 1919   CALCIUM 9.6 06/06/2014 1433   PROT 8.1 10/25/2022 1919   PROT 8.4 (H) 06/06/2014 1433   ALBUMIN 4.6 10/25/2022 1919   ALBUMIN 3.7 06/06/2014 1433   AST 14 10/25/2022 1919   AST 19 06/06/2014 1433   ALT 12 10/25/2022 1919   ALT 20 06/06/2014 1433   ALKPHOS 145 (H) 10/25/2022 1919   ALKPHOS 96 06/06/2014 1433   BILITOT 0.4 10/25/2022 1919   BILITOT 0.5 06/06/2014 1433   EGFR 89 10/25/2022 1919   GFRNONAA >60 01/02/2022 1359   GFRNONAA 47 (L) 06/06/2014  1433     Lab Results  Component Value Date   HGBA1C 8.5 (A) 01/03/2023       Component Value Date/Time   CHOL 181 10/25/2022 1919   HDL 33 (L) 10/25/2022 1919   CHOLHDL 5.5 (H) 10/25/2022 1919   CHOLHDL 4.3 03/29/2007 0615   VLDL 17 03/29/2007 0615   LDLCALC 127 (H) 10/25/2022 1919    Lab Results  Component Value Date   AST 14 10/25/2022   Lab Results  Component Value Date   ALT 12 10/25/2022    Urinalysis    Component Value Date/Time   COLORURINE STRAW (A) 12/09/2019 0130   APPEARANCEUR Cloudy (A) 11/28/2021 1550   LABSPEC 1.031 (H) 12/09/2019 0130   LABSPEC 1.017 06/06/2014 1433   PHURINE 7.0 12/09/2019 0130   GLUCOSEU Negative 11/28/2021 1550   GLUCOSEU 150 mg/dL 40/98/1191 4782   HGBUR NEGATIVE 12/09/2019 0130   BILIRUBINUR Negative 11/28/2021 1550   BILIRUBINUR Negative 06/06/2014 1433   KETONESUR NEGATIVE 12/09/2019 0130   PROTEINUR 1+ (A) 11/28/2021 1550   PROTEINUR NEGATIVE 12/09/2019 0130   UROBILINOGEN 0.2 01/23/2014 2221   NITRITE Negative 11/28/2021 1550   NITRITE NEGATIVE 12/09/2019 0130   LEUKOCYTESUR Trace (A) 11/28/2021 1550   LEUKOCYTESUR TRACE (A) 12/09/2019 0130   LEUKOCYTESUR Negative 06/06/2014 1433  I have reviewed the labs.   Pertinent Imaging: KUB bilateral nephrolithiasis, radiologist interpretation still pending  I have independently reviewed the films.    Assessment & Plan:    1.   Nephrolithiasis -Asymptomatic -BMP and CBC pending -KUB bilateral nephrolithiasis -Continue to monitor no need for intervention at this time  Return in about 1 year (around 03/27/2024) for KUB, CBC, BMP.  These notes generated with voice recognition software. I apologize for typographical errors.  Cloretta Ned  [facility] [address]  [address] [place], [place] [address] [phone]

## 2023-03-28 ENCOUNTER — Ambulatory Visit
Admission: RE | Admit: 2023-03-28 | Discharge: 2023-03-28 | Disposition: A | Discharge status: Home / Self Care | Payer: Self-pay | Source: Ambulatory Visit | Attending: Urology | Admitting: Urology

## 2023-03-28 ENCOUNTER — Ambulatory Visit: Payer: Self-pay

## 2023-03-28 ENCOUNTER — Ambulatory Visit (INDEPENDENT_AMBULATORY_CARE_PROVIDER_SITE_OTHER): Payer: Self-pay | Admitting: Urology

## 2023-03-28 ENCOUNTER — Other Ambulatory Visit: Payer: Self-pay

## 2023-03-28 ENCOUNTER — Encounter: Payer: Self-pay | Admitting: Urology

## 2023-03-28 VITALS — BP 123/69 | HR 83

## 2023-03-28 DIAGNOSIS — N200 Calculus of kidney: Secondary | ICD-10-CM

## 2023-03-28 MED ORDER — POTASSIUM CITRATE ER 10 MEQ (1080 MG) PO TBCR
10.0000 meq | EXTENDED_RELEASE_TABLET | Freq: Two times a day (BID) | ORAL | 11 refills | Status: DC
  Filled 2023-03-28 – 2023-04-13 (×2): qty 60, 30d supply, fill #0
  Filled 2023-06-27 – 2023-07-16 (×2): qty 60, 30d supply, fill #1
  Filled 2023-09-04 (×2): qty 60, 30d supply, fill #2
  Filled 2023-10-20: qty 60, 30d supply, fill #3
  Filled 2023-12-06 – 2023-12-26 (×2): qty 60, 30d supply, fill #4
  Filled 2024-01-25 (×2): qty 60, 30d supply, fill #5
  Filled 2024-03-02 – 2024-03-13 (×3): qty 60, 30d supply, fill #6

## 2023-03-29 ENCOUNTER — Other Ambulatory Visit: Payer: Self-pay

## 2023-03-29 LAB — BASIC METABOLIC PANEL
BUN/Creatinine Ratio: 12 (ref 9–23)
BUN: 11 mg/dL (ref 6–24)
CO2: 22 mmol/L (ref 20–29)
Calcium: 10.2 mg/dL (ref 8.7–10.2)
Chloride: 100 mmol/L (ref 96–106)
Creatinine, Ser: 0.9 mg/dL (ref 0.57–1.00)
Glucose: 92 mg/dL (ref 70–99)
Potassium: 4.8 mmol/L (ref 3.5–5.2)
Sodium: 141 mmol/L (ref 134–144)
eGFR: 78 mL/min/{1.73_m2} (ref >59)

## 2023-03-29 LAB — CBC
Hematocrit: 40.6 % (ref 34.0–46.6)
Hemoglobin: 12 g/dL (ref 11.1–15.9)
MCH: 23.3 pg — ABNORMAL LOW (ref 26.6–33.0)
MCHC: 29.6 g/dL — ABNORMAL LOW (ref 31.5–35.7)
MCV: 79 fL (ref 79–97)
Platelets: 390 10*3/uL (ref 150–450)
RBC: 5.16 x10E6/uL (ref 3.77–5.28)
RDW: 15 % (ref 11.7–15.4)
WBC: 7.2 10*3/uL (ref 3.4–10.8)

## 2023-04-02 ENCOUNTER — Other Ambulatory Visit: Payer: Self-pay

## 2023-04-04 ENCOUNTER — Ambulatory Visit: Payer: Self-pay | Admitting: Gerontology

## 2023-04-04 ENCOUNTER — Other Ambulatory Visit: Payer: Self-pay

## 2023-04-04 VITALS — BP 123/71 | HR 71 | Wt 273.5 lb

## 2023-04-04 DIAGNOSIS — E1142 Type 2 diabetes mellitus with diabetic polyneuropathy: Secondary | ICD-10-CM

## 2023-04-04 DIAGNOSIS — E1165 Type 2 diabetes mellitus with hyperglycemia: Secondary | ICD-10-CM

## 2023-04-04 LAB — POCT GLYCOSYLATED HEMOGLOBIN (HGB A1C): Hemoglobin A1C: 6.5 % — AB (ref 4.0–5.6)

## 2023-04-04 LAB — GLUCOSE, POCT (MANUAL RESULT ENTRY): POC Glucose: 132 mg/dL — AB (ref 70–99)

## 2023-04-04 MED ORDER — BASAGLAR KWIKPEN 100 UNIT/ML ~~LOC~~ SOPN
40.0000 [IU] | PEN_INJECTOR | Freq: Every day | SUBCUTANEOUS | 11 refills | Status: DC
  Filled 2023-04-04 – 2023-04-13 (×2): qty 10, 25d supply, fill #0
  Filled 2023-04-16 – 2023-05-01 (×2): qty 15, 37d supply, fill #0
  Filled 2023-06-27 – 2023-07-16 (×4): qty 15, 37d supply, fill #1
  Filled 2023-09-04 (×2): qty 15, 37d supply, fill #2
  Filled 2023-10-20 – 2023-10-21 (×2): qty 15, 37d supply, fill #3
  Filled 2023-12-06 – 2023-12-11 (×2): qty 15, 37d supply, fill #4
  Filled 2024-01-25 (×2): qty 15, 37d supply, fill #5
  Filled 2024-03-02 – 2024-03-13 (×3): qty 15, 37d supply, fill #6

## 2023-04-04 NOTE — Progress Notes (Signed)
Established Patient Office Visit  Subjective   Patient ID: Cason Falb, female    DOB: May 01, 1976  Age: 47 y.o. MRN: [id number]    HPI  Gaia Cyndle Bastedo is a 47 year-old female with a history of Depression, Diabetes, GERD, Headache, Nephrolithiasis, Hyperlipidemia, Hypertension, Neuromuscular disorder, and chronic low back pain who presents for routine follow up visit and lab review.  She was seen at [facility] on 03/28/23.  Her KUB showed bilateral nephrolithiasis with an 11mm right lower pole stone and a 4mm left lower pole stone.  No interventions were recommended at this time because patient is asymptomatic but the recorded plan is to continue to monitor.  Her labs were unremarkable and she expressed relief when informed of the same.  She denies abdominal pain but endorses chronic lower back pain rated at 4/10 which she reports as a tolerable level.  Her blood glucose in clinic was 132mg /dL.  She states she checks her blood glucose daily and her readings range from 83mg /dL to 95mg /dL.  Her HgbA1c was 6.5% during visit and it decreased from 8.5% when taken 01/03/23.  She denies hypo/hyperglycemia episodes.  She reports she is compliant with her medications, denies side effects, and continues to make healthy lifestyle changes.  Overall she is doing well and offers no complaints.   Patient Active Problem List   Diagnosis Date Noted   Bilateral arm pain 08/31/2021   History of migraine headaches 02/22/2021   Abnormal finding on urinalysis 09/20/2020   Essential hypertension 08/24/2020   Memory impairment 08/24/2020   Bradycardia 08/09/2020   Elevated blood pressure reading 08/09/2020   Back pain 08/09/2020   Left shoulder pain 03/29/2020   Irregular heart beat 03/29/2020   Bilateral edema of lower extremity 03/08/2020   Acute pyelonephritis 12/09/2019   AKI (acute kidney injury) (HCC) 12/09/2019   Hydronephrosis with renal and ureteral calculus  obstruction 12/09/2019   Peripheral neuropathy 03/19/2019   Blurry vision 01/27/2019   Hyperlipidemia associated with type 2 diabetes mellitus (HCC) 11/13/2018   Rash 11/13/2018   Headache 01/16/2018   Back pain 01/16/2018   Obesity, Class III, BMI 40-49.9 (morbid obesity) (HCC) 05/01/2016   Depression 05/01/2016   Metrorrhagia 11/10/2015   Type 2 diabetes mellitus with hyperglycemia (HCC) 03/10/2015   Anemia 03/10/2015   Edema 03/10/2015   Past Medical History:  Diagnosis Date   Depression    Diabetes mellitus without complication (HCC)    GERD (gastroesophageal reflux disease)    Headache    History of kidney stones    History of nephrolithiasis 2015   via CT imaging, including an obstructing stone   Hyperlipidemia    Hypertension 12/2019   per patient, she has never been treated for high blood pressure   Low back pain 12/2019   Neuromuscular disorder (HCC)    neuropathy   Past Surgical History:  Procedure Laterality Date   CHOLECYSTECTOMY  1998   CYSTOSCOPY WITH STENT PLACEMENT Right 12/09/2019   Procedure: CYSTOSCOPY WITH STENT PLACEMENT;  Surgeon: Riki Altes, MD;  Location: [facility];  Service: Urology;  Laterality: Right;   CYSTOSCOPY/RETROGRADE/URETEROSCOPY  12/29/2019   Procedure: CYSTOSCOPY/RETROGRADE/URETEROSCOPY;  Surgeon: Riki Altes, MD;  Location: [facility];  Service: Urology;;   Family History  Problem Relation Age of Onset   Hypertension Mother    Heart disease Mother    Kidney failure Father    Sleep apnea Sister    Pancreatic cancer Maternal Grandmother    Liver disease Maternal  Grandfather    Other Paternal Grandmother        unknown medical history   Other Paternal Grandfather        unknown medical history   Diabetes Maternal Great-grandmother    Allergies  Allergen Reactions   Penicillins Hives    Raised welts   Topiramate     Other reaction(s): Other (See Comments) Worsened migraines, N/V   Topiramate Er Nausea And Vomiting     Intensified Migraines    Valtrex [Valacyclovir] Itching    Diarrhea, abdominal pain, and possibly itching  Does not remember timing      Review of Systems  Constitutional: Negative.   HENT: Negative.    Eyes: Negative.   Respiratory: Negative.    Cardiovascular: Negative.   Gastrointestinal: Negative.   Genitourinary: Negative.   Musculoskeletal:  Positive for back pain.  Skin: Negative.   Neurological: Negative.   Endo/Heme/Allergies: Negative.   Psychiatric/Behavioral: Negative.        Objective:     BP 123/71 (BP Location: Right Wrist, Patient Position: Sitting, Cuff Size: Large)   Pulse 71   Wt 273 lb 8 oz (124.1 kg)   LMP 09/29/2018 (Approximate)   BMI 42.93 kg/m  BP Readings from Last 3 Encounters:  04/04/23 123/71  03/28/23 123/69  02/07/23 137/75   Wt Readings from Last 3 Encounters:  04/04/23 273 lb 8 oz (124.1 kg)  02/07/23 275 lb (124.7 kg)  01/03/23 278 lb 1.6 oz (126.1 kg)      Physical Exam Constitutional:      Appearance: Normal appearance.  Eyes:     Pupils: Pupils are equal, round, and reactive to light.  Cardiovascular:     Rate and Rhythm: Normal rate and regular rhythm.     Pulses: Normal pulses.     Heart sounds: Normal heart sounds.  Pulmonary:     Effort: Pulmonary effort is normal.     Breath sounds: Normal breath sounds.  Abdominal:     General: Bowel sounds are normal.     Palpations: Abdomen is soft.  Musculoskeletal:        General: Normal range of motion.  Skin:    General: Skin is warm and dry.  Neurological:     General: No focal deficit present.     Mental Status: She is alert and oriented to person, place, and time.  Psychiatric:        Mood and Affect: Mood normal.        Behavior: Behavior normal.       Results for orders placed or performed in visit on 04/04/23  POCT HgB A1C  Result Value Ref Range   Hemoglobin A1C 6.5 (A) 4.0 - 5.6 %   HbA1c POC (<> result, manual entry)     HbA1c, POC (prediabetic  range)     HbA1c, POC (controlled diabetic range)    POCT Glucose (CBG)  Result Value Ref Range   POC Glucose 132 (A) 70 - 99 mg/dl    Last CBC Lab Results  Component Value Date   WBC 7.2 03/28/2023   HGB 12.0 03/28/2023   HCT 40.6 03/28/2023   MCV 79 03/28/2023   MCH 23.3 (L) 03/28/2023   RDW 15.0 03/28/2023   PLT 390 03/28/2023   Last metabolic panel Lab Results  Component Value Date   GLUCOSE 92 03/28/2023   NA 141 03/28/2023   K 4.8 03/28/2023   CL 100 03/28/2023   CO2 22 03/28/2023   BUN 11 03/28/2023  CREATININE 0.90 03/28/2023   EGFR 78 03/28/2023   CALCIUM 10.2 03/28/2023   PHOS 4.3 08/31/2020   PROT 8.1 10/25/2022   ALBUMIN 4.6 10/25/2022   LABGLOB 3.5 10/25/2022   AGRATIO 1.3 10/25/2022   BILITOT 0.4 10/25/2022   ALKPHOS 145 (H) 10/25/2022   AST 14 10/25/2022   ALT 12 10/25/2022   ANIONGAP 10 01/02/2022   Last lipids Lab Results  Component Value Date   CHOL 181 10/25/2022   HDL 33 (L) 10/25/2022   LDLCALC 127 (H) 10/25/2022   TRIG 116 10/25/2022   CHOLHDL 5.5 (H) 10/25/2022   Last hemoglobin A1c Lab Results  Component Value Date   HGBA1C 6.5 (A) 04/04/2023   Last thyroid functions Lab Results  Component Value Date   TSH 1.990 01/27/2021   T4TOTAL 5.9 10/27/2015   Last vitamin D No results found for: "25OHVITD2", "25OHVITD3", "VD25OH" Last vitamin B12 and Folate Lab Results  Component Value Date   VITAMINB12 410 01/16/2022   FOLATE 7.7 01/16/2022      The 10-year ASCVD risk score (Arnett DK, et al., 2019) is: 12.1%    Assessment & Plan:   1. Type 2 diabetes mellitus with diabetic polyneuropathy, with long-term current use of insulin (HCC) -Her diabetes is well controlled.  Her HgbA1c is within target range of under 7%.  Her nightly dose of  Lantus was decreased to 40 units to decrease risk of hypoglycemia.  Educated her on signs and symptoms of hypoglycemia, what to do if they occur, and advised her to call the clinic to notify if  these symptoms occur.  Encouraged her to continue taking her medications as prescribed and continue to monitor her blood glucose daily, keep a record, and bring a log or her monitor with her to her next scheduled appointment.  Advised her to continue with a low carbohydrate/non concentrated sweets diet and exercise as tolerated.  - POCT Glucose (CBG); Future - POCT HgB A1C; Future - POCT HgB A1C - POCT Glucose (CBG) - insulin glargine (LANTUS) 100 UNIT/ML injection; Inject 0.4 mLs (40 Units total) into the skin at bedtime.  Dispense: 10 mL; Refill: 11     Return in about 7 weeks (around 05/23/2023).    Lattie Corns, RN

## 2023-04-05 ENCOUNTER — Other Ambulatory Visit: Payer: Self-pay

## 2023-04-07 MED FILL — Metoprolol Succinate Tab ER 24HR 25 MG (Tartrate Equiv): ORAL | 90 days supply | Qty: 90 | Fill #1 | Status: AC

## 2023-04-08 ENCOUNTER — Other Ambulatory Visit: Payer: Self-pay

## 2023-04-09 ENCOUNTER — Other Ambulatory Visit: Payer: Self-pay

## 2023-04-13 ENCOUNTER — Other Ambulatory Visit: Payer: Self-pay

## 2023-04-13 MED FILL — "Insulin Pen Needle 32 G X 6 MM (1/4"" or 15/64"")": 400 days supply | Qty: 400 | Fill #0 | Status: CN

## 2023-04-13 MED FILL — Semaglutide Soln Pen-inj 0.25 or 0.5 MG/DOSE (2 MG/1.5ML): SUBCUTANEOUS | 112 days supply | Qty: 3 | Fill #0 | Status: CN

## 2023-04-14 ENCOUNTER — Other Ambulatory Visit: Payer: Self-pay

## 2023-04-15 ENCOUNTER — Other Ambulatory Visit: Payer: Self-pay

## 2023-04-16 ENCOUNTER — Other Ambulatory Visit: Payer: Self-pay

## 2023-04-16 MED FILL — "Insulin Pen Needle 32 G X 6 MM (1/4"" or 15/64"")": 100 days supply | Qty: 100 | Fill #0 | Status: CN

## 2023-04-16 MED FILL — Semaglutide Soln Pen-inj 0.25 or 0.5 MG/DOSE (2 MG/1.5ML): SUBCUTANEOUS | 56 days supply | Qty: 1.5 | Fill #0 | Status: CN

## 2023-04-17 ENCOUNTER — Other Ambulatory Visit: Payer: Self-pay

## 2023-04-25 ENCOUNTER — Other Ambulatory Visit: Payer: Self-pay

## 2023-04-29 ENCOUNTER — Other Ambulatory Visit: Payer: Self-pay

## 2023-05-01 ENCOUNTER — Other Ambulatory Visit: Payer: Self-pay

## 2023-05-01 MED FILL — "Insulin Pen Needle 32 G X 6 MM (1/4"" or 15/64"")": 100 days supply | Qty: 100 | Fill #0 | Status: AC

## 2023-05-01 MED FILL — Semaglutide Soln Pen-inj 0.25 or 0.5 MG/DOSE (2 MG/1.5ML): SUBCUTANEOUS | 56 days supply | Qty: 1.5 | Fill #0 | Status: AC

## 2023-05-04 ENCOUNTER — Other Ambulatory Visit: Payer: Self-pay

## 2023-05-04 ENCOUNTER — Emergency Department
Admission: EM | Admit: 2023-05-04 | Discharge: 2023-05-04 | Disposition: A | Discharge status: Home / Self Care | Payer: Self-pay | Attending: Emergency Medicine | Admitting: Emergency Medicine

## 2023-05-04 DIAGNOSIS — E119 Type 2 diabetes mellitus without complications: Secondary | ICD-10-CM | POA: Insufficient documentation

## 2023-05-04 DIAGNOSIS — M7918 Myalgia, other site: Secondary | ICD-10-CM | POA: Insufficient documentation

## 2023-05-04 DIAGNOSIS — M791 Myalgia, unspecified site: Secondary | ICD-10-CM

## 2023-05-04 DIAGNOSIS — I10 Essential (primary) hypertension: Secondary | ICD-10-CM | POA: Insufficient documentation

## 2023-05-04 MED ORDER — CYCLOBENZAPRINE HCL 5 MG PO TABS
5.0000 mg | ORAL_TABLET | Freq: Three times a day (TID) | ORAL | 1 refills | Status: AC | PRN
  Filled 2023-05-04: qty 30, 10d supply, fill #0
  Filled 2023-07-16: qty 30, 10d supply, fill #1

## 2023-05-04 MED ORDER — NAPROXEN 500 MG PO TABS
500.0000 mg | ORAL_TABLET | Freq: Two times a day (BID) | ORAL | 1 refills | Status: AC
  Filled 2023-05-04: qty 30, 15d supply, fill #0

## 2023-05-04 MED ORDER — NAPROXEN 500 MG PO TABS
500.0000 mg | ORAL_TABLET | Freq: Once | ORAL | Status: AC
  Administered 2023-05-04: 500 mg via ORAL
  Filled 2023-05-04: qty 1

## 2023-05-04 MED ORDER — CYCLOBENZAPRINE HCL 10 MG PO TABS
10.0000 mg | ORAL_TABLET | Freq: Once | ORAL | Status: AC
  Administered 2023-05-04: 10 mg via ORAL
  Filled 2023-05-04: qty 1

## 2023-05-04 NOTE — ED Provider Notes (Signed)
[facility] Emergency Department Provider Note     Event Date/Time   First MD Initiated Contact with Patient 05/04/23 1818     (approximate)   History   Back Pain and Shoulder Pain   HPI  [name] is a 47 y.o. female with a history of type 2 diabetes, HLD, HTN, GERD, and peripheral neuropathy, presents to the ED for evaluation of shoulder and upper back pain.  Patient reports she was diagnosed with arthritis by her primary provider.  She was referred for PT but could not afford the sessions.  She was also prescribed a muscle relaxant but for some reason did not take that medication.  She has been taking Tylenol and ibuprofen with limited benefit.  She presents to the ED for evaluation management of her pain.  The patient denies any chest pain, shortness of breath, nausea, vomiting, diarrhea.  She denies any diaphoresis, cough, or congestion.  No reports of any recent injury, trauma, or fall.  Physical Exam   Triage Vital Signs: ED Triage Vitals  Encounter Vitals Group     BP 05/04/23 1508 124/63     Systolic BP Percentile --      Diastolic BP Percentile --      Pulse Rate 05/04/23 1508 75     Resp 05/04/23 1508 18     Temp 05/04/23 1508 98.1 F (36.7 C)     Temp Source 05/04/23 1508 Oral     SpO2 05/04/23 1508 97 %     Weight 05/04/23 1500 273 lb (123.8 kg)     Height 05/04/23 1500 5\' 6"  (1.676 m)     Head Circumference --      Peak Flow --      Pain Score 05/04/23 1500 8     Pain Loc --      Pain Education --      Exclude from Growth Chart --     Most recent vital signs: Vitals:   05/04/23 1508 05/04/23 2104  BP: 124/63 135/78  Pulse: 75 67  Resp: 18 18  Temp: 98.1 F (36.7 C) 98 F (36.7 C)  SpO2: 97% 96%    General Awake, no distress. NAD HEENT NCAT. PERRL. EOMI. No rhinorrhea. Mucous membranes are moist.  CV:  Good peripheral perfusion.  RESP:  Normal effort.  ABD:  No distention.  MSK:  Normal spinal alignment  without midline tenderness, spasm, vomiting, or step-off.  Normal active range of motion of the extremities.  Normal fluid range of motion of the cervical spine without difficulty. NEURO: Cranial nerves II to XII grossly intact.   ED Results / Procedures / Treatments   Labs (all labs ordered are listed, but only abnormal results are displayed) Labs Reviewed - No data to display   EKG   RADIOLOGY  No results found.   PROCEDURES:  Critical Care performed: No  Procedures   MEDICATIONS ORDERED IN ED: Medications  cyclobenzaprine (FLEXERIL) tablet 10 mg (10 mg Oral Given 05/04/23 2115)  naproxen (NAPROSYN) tablet 500 mg (500 mg Oral Given 05/04/23 2115)     IMPRESSION / MDM / ASSESSMENT AND PLAN / ED COURSE  I reviewed the triage vital signs and the nursing notes.                              Differential diagnosis includes, but is not limited to, cervical strain, cervical radiculopathy, myalgias, cough tendinitis, shoulder  strain, DJD  Patient's presentation is most consistent with acute complicated illness / injury requiring diagnostic workup.  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  No acute neuro muscle deficits on exam.  Patient will be discharged home with prescriptions for cyclobenzaprine and naproxen. Patient is to follow up with PCP as discussed, as needed or otherwise directed. Patient is given ED precautions to return to the ED for any worsening or new symptoms.     FINAL CLINICAL IMPRESSION(S) / ED DIAGNOSES   Final diagnoses:  Myalgia     Rx / DC Orders   ED Discharge Orders          Ordered    cyclobenzaprine (FLEXERIL) 5 MG tablet  3 times daily PRN        05/04/23 1901    naproxen (NAPROSYN) 500 MG tablet  2 times daily with meals        05/04/23 1901             Note:  This document was prepared using Dragon voice recognition software and may include  unintentional dictation errors.    Lissa Hoard, PA-C 05/04/23 2118    Chesley Noon, MD 05/04/23 2218

## 2023-05-04 NOTE — ED Triage Notes (Signed)
Pt reports pain to her shoulder and upper back. Pt reports was seen by her MD and was dx'd with arthritis. Pt reports they wanted her to go through PT but she was not able to afford it. Pt also reports they prescribed her methocarbamol but she could not take it so she has been trying to use tylenol and ibuprofen but it is not helping the pain. Pt needs help with controlling the pain.

## 2023-05-04 NOTE — Discharge Instructions (Signed)
Your exam is reassuring at this time.  Your symptoms seem to be due to some mild musculoskeletal pain as well as some likely mild underlying arthritis.  Take the pressure meds as directed.  Follow-up with your primary provider for ongoing evaluation and management.

## 2023-05-05 ENCOUNTER — Other Ambulatory Visit: Payer: Self-pay

## 2023-05-14 ENCOUNTER — Other Ambulatory Visit: Payer: Self-pay

## 2023-05-23 ENCOUNTER — Ambulatory Visit: Payer: Self-pay | Admitting: Gerontology

## 2023-06-05 ENCOUNTER — Other Ambulatory Visit: Payer: Self-pay

## 2023-06-06 ENCOUNTER — Other Ambulatory Visit: Payer: Self-pay

## 2023-06-20 ENCOUNTER — Other Ambulatory Visit: Payer: Self-pay

## 2023-06-20 MED ORDER — OZEMPIC (0.25 OR 0.5 MG/DOSE) 2 MG/3ML ~~LOC~~ SOPN
0.2500 | PEN_INJECTOR | SUBCUTANEOUS | 3 refills | Status: DC
  Filled 2023-06-20: qty 3, 28d supply, fill #0
  Filled 2023-07-16: qty 12, fill #0
  Filled 2023-09-04: qty 9, 84d supply, fill #0

## 2023-06-27 ENCOUNTER — Other Ambulatory Visit: Payer: Self-pay

## 2023-07-02 ENCOUNTER — Other Ambulatory Visit: Payer: Self-pay

## 2023-07-08 ENCOUNTER — Other Ambulatory Visit: Payer: Self-pay

## 2023-07-10 ENCOUNTER — Other Ambulatory Visit: Payer: Self-pay

## 2023-07-16 ENCOUNTER — Other Ambulatory Visit: Payer: Self-pay

## 2023-07-16 ENCOUNTER — Other Ambulatory Visit: Payer: Self-pay | Admitting: Gerontology

## 2023-07-16 DIAGNOSIS — E1142 Type 2 diabetes mellitus with diabetic polyneuropathy: Secondary | ICD-10-CM

## 2023-07-16 DIAGNOSIS — I10 Essential (primary) hypertension: Secondary | ICD-10-CM

## 2023-07-16 MED FILL — "Insulin Pen Needle 32 G X 6 MM (1/4"" or 15/64"")": 100 days supply | Qty: 100 | Fill #1 | Status: AC

## 2023-07-16 MED FILL — Lisinopril Tab 5 MG: ORAL | 14 days supply | Qty: 14 | Fill #0 | Status: AC

## 2023-07-16 MED FILL — Semaglutide Soln Pen-inj 0.25 or 0.5 MG/DOSE (2 MG/1.5ML): SUBCUTANEOUS | 56 days supply | Qty: 1.5 | Fill #0 | Status: CN

## 2023-07-23 ENCOUNTER — Other Ambulatory Visit: Payer: Self-pay

## 2023-07-24 ENCOUNTER — Other Ambulatory Visit: Payer: Self-pay

## 2023-07-24 MED FILL — Semaglutide Soln Pen-inj 0.25 or 0.5 MG/DOSE (2 MG/3ML): SUBCUTANEOUS | 28 days supply | Qty: 3 | Fill #0 | Status: AC

## 2023-07-25 ENCOUNTER — Other Ambulatory Visit: Payer: Self-pay

## 2023-07-30 ENCOUNTER — Other Ambulatory Visit: Payer: Self-pay

## 2023-08-13 ENCOUNTER — Other Ambulatory Visit: Payer: Self-pay

## 2023-08-20 ENCOUNTER — Other Ambulatory Visit: Payer: Self-pay

## 2023-09-03 ENCOUNTER — Other Ambulatory Visit: Payer: Self-pay

## 2023-09-04 ENCOUNTER — Other Ambulatory Visit: Payer: Self-pay

## 2023-09-04 ENCOUNTER — Other Ambulatory Visit: Payer: Self-pay | Admitting: Gerontology

## 2023-09-04 ENCOUNTER — Other Ambulatory Visit: Payer: Self-pay | Admitting: Physician Assistant

## 2023-09-04 DIAGNOSIS — I10 Essential (primary) hypertension: Secondary | ICD-10-CM

## 2023-09-04 MED FILL — "Insulin Pen Needle 32 G X 6 MM (1/4"" or 15/64"")": 100 days supply | Qty: 100 | Fill #2 | Status: CN

## 2023-09-05 ENCOUNTER — Other Ambulatory Visit: Payer: Self-pay

## 2023-09-05 MED ORDER — LISINOPRIL 5 MG PO TABS
5.0000 mg | ORAL_TABLET | Freq: Every day | ORAL | 0 refills | Status: DC
  Filled 2023-09-05: qty 14, 14d supply, fill #0

## 2023-09-06 ENCOUNTER — Other Ambulatory Visit: Payer: Self-pay

## 2023-09-06 ENCOUNTER — Other Ambulatory Visit: Payer: Self-pay | Admitting: Gerontology

## 2023-09-06 DIAGNOSIS — E1142 Type 2 diabetes mellitus with diabetic polyneuropathy: Secondary | ICD-10-CM

## 2023-09-10 ENCOUNTER — Other Ambulatory Visit: Payer: Self-pay

## 2023-09-10 ENCOUNTER — Telehealth: Payer: Self-pay

## 2023-09-10 NOTE — Telephone Encounter (Signed)
 Called pt to make appt per [name]. Appt made. Pt verbalized understanding.

## 2023-09-12 ENCOUNTER — Other Ambulatory Visit: Payer: Self-pay

## 2023-09-12 ENCOUNTER — Encounter: Payer: Self-pay | Admitting: Gerontology

## 2023-09-12 ENCOUNTER — Ambulatory Visit: Payer: Self-pay | Admitting: Gerontology

## 2023-09-12 VITALS — BP 106/68 | HR 63 | Ht 68.0 in | Wt 267.9 lb

## 2023-09-12 DIAGNOSIS — Z794 Long term (current) use of insulin: Secondary | ICD-10-CM

## 2023-09-12 DIAGNOSIS — I10 Essential (primary) hypertension: Secondary | ICD-10-CM

## 2023-09-12 DIAGNOSIS — E1169 Type 2 diabetes mellitus with other specified complication: Secondary | ICD-10-CM

## 2023-09-12 LAB — POCT GLYCOSYLATED HEMOGLOBIN (HGB A1C): Hemoglobin A1C: 6.4 % — AB (ref 4.0–5.6)

## 2023-09-12 LAB — GLUCOSE, POCT (MANUAL RESULT ENTRY): POC Glucose: 94 mg/dL (ref 70–99)

## 2023-09-12 MED ORDER — EMPAGLIFLOZIN 10 MG PO TABS
10.0000 mg | ORAL_TABLET | Freq: Every day | ORAL | 0 refills | Status: DC
  Filled 2023-09-12 – 2023-12-06 (×5): qty 90, 90d supply, fill #0

## 2023-09-12 MED ORDER — METOPROLOL SUCCINATE ER 25 MG PO TB24
25.0000 mg | ORAL_TABLET | Freq: Every day | ORAL | 1 refills | Status: AC
  Filled 2023-09-12: qty 90, 90d supply, fill #0
  Filled 2024-01-25 (×2): qty 90, 90d supply, fill #1

## 2023-09-12 MED ORDER — SEMAGLUTIDE(0.25 OR 0.5MG/DOS) 2 MG/3ML ~~LOC~~ SOPN
0.5000 mg | PEN_INJECTOR | SUBCUTANEOUS | 2 refills | Status: AC
  Filled 2023-09-12: qty 3, 28d supply, fill #0
  Filled 2023-10-20 – 2024-03-13 (×5): qty 3, 28d supply, fill #1
  Filled 2024-05-22 – 2024-06-05 (×2): qty 3, 28d supply, fill #2

## 2023-09-12 MED ORDER — SEMAGLUTIDE (1 MG/DOSE) 4 MG/3ML ~~LOC~~ SOPN
0.5000 mg | PEN_INJECTOR | SUBCUTANEOUS | 2 refills | Status: DC
Start: 2023-09-12 — End: 2023-09-12
  Filled 2023-09-12: qty 3, 56d supply, fill #0

## 2023-09-12 MED ORDER — LISINOPRIL 5 MG PO TABS
5.0000 mg | ORAL_TABLET | Freq: Every day | ORAL | 0 refills | Status: DC
  Filled 2023-09-12: qty 90, 90d supply, fill #0

## 2023-09-12 NOTE — Patient Instructions (Signed)
 Diabetes Action Plan A diabetes action plan is a way for you to manage your symptoms of diabetes, also called diabetes mellitus. The plan is color-coded to guide you on what actions to take based on any symptoms you're having. If you have symptoms in the red zone, you need medical care right away. If you have symptoms in the yellow zone, your diabetes isn't under control, and you may need to make some changes. If you have symptoms in the green zone, you're doing well. Understanding diabetes can take time. Follow the treatment plan that you created with your health care provider. Know the target range for your blood sugar, also called glucose. Review your plan each time you visit your provider. The target range for my blood sugar level is __________________________ mg/dL. Red zone Get medical help right away if you have any of the following symptoms: A blood sugar test result that's below 54 mg/dL (3 mmol/L). A blood sugar test result that's at or above 240 mg/dL (16.1 mmol/L) for 2 days in a row along with: Extreme thirst and frequent peeing. Confusion or trouble thinking clearly. Moderate or large ketone levels in your pee (urine). Feeling tired or having no energy. Trouble breathing. Sickness or a fever for 2 or more days that's not getting better. These symptoms may be an emergency. Call 911 right away. Do not wait to see if the symptoms will go away. Do not drive yourself to the hospital. If you have very low blood sugar, also called severe hypoglycemia, and you can't eat or drink, you may need glucagon. Make sure a family member or close friend knows how to check your blood sugar and how to give you glucagon. You may need to be treated in a hospital for this condition. Yellow zone If you have any of the following symptoms, your diabetes isn't under control, and you may need to make some changes: A blood sugar test result that's at or above 240 mg/dL (09.6 mmol/L) for 2 days in a  row. Blood sugar test results that are below 70 mg/dL (3.9 mmol/L). Other symptoms of hypoglycemia, such as: Shaking or feeling light-headed. Confusion or irritability. Feeling hungry. Having a fast heartbeat. If you have any yellow zone symptoms: Treat your hypoglycemia by eating or drinking 15 grams of a rapid-acting carbohydrate. Follow the 15:15 rule: Take 15 grams of a rapid-acting carbohydrate, such as: 1 tube of glucose gel. 4 glucose pills. 4 oz (120 mL) of fruit juice. 4 oz (120 mL) of regular (not diet) soda. Check your blood sugar again 15 minutes after you take the carbohydrate. If the second blood sugar test is still at or below 70 mg/dL (3.9 mmol/L), take 15 grams of a carbohydrate again. If your blood sugar doesn't increase above 70 mg/dL (3.9 mmol/L) after 3 tries, get medical help right away. After your blood sugar returns to normal, eat a meal or a snack within 1 hour. Keep taking your daily medicines as told by your provider. Check your blood sugar more often than you normally would. Write down your results. Call your provider if you have trouble keeping your blood sugar in your target range. Green zone These signs mean you're doing well and can continue what you're doing to manage your diabetes: Your blood sugar is within your personal target range. For most people, a blood sugar level before a meal should be 80-130 mg/dL (0.4-5.4 mmol/L). You feel well, and you're able to do daily activities. If you're in the green zone,  continue to manage your diabetes as told by your provider. To do this: Eat a healthy diet. Exercise regularly. Check your blood sugar as told. Take your medicines only as told. Where to find more information American Diabetes Association (ADA): diabetes.org Association of Diabetes Care & Education Specialists (ADCES): adces.org/diabetes-education-dsmes This information is not intended to replace advice given to you by your health care provider.  Make sure you discuss any questions you have with your health care provider. Document Revised: 12/19/2022 Document Reviewed: 12/19/2022 Elsevier Patient Education  2024 [employer].

## 2023-09-12 NOTE — Progress Notes (Signed)
 Established Patient Office Visit  Subjective   Patient ID: [name], female    DOB: 1975-07-11  Age: 48 y.o. MRN: [id number]  Chief Complaint  Patient presents with   Follow-up    DM    HPI [name] is a 48 year-old female with a history of Depression, Diabetes, GERD, Headache, Nephrolithiasis, Hyperlipidemia, Hypertension, Neuromuscular disorder, and chronic low back pain who presents for routine follow up visit.  She reports she is taking her medication as prescribed, denies side effects and continues to make healthy lifestyle changes.  She reports the Ozempic  is working at its current dosage and she has adjusted well to it.  Her blood glucose was checked during this visit and it was 98 mg/dl and her [id number] decreased from 6.5% to 6.4%.  She reports she checks her blood glucose 3 times/day and it usually ranges between 85 mg/dl to 914 mg/dl fasting in the morning.  She denies hypo/hyperglycemic symptoms, peripheral neuropathy and performs daily foot checks.  Her blood pressure was checked during this visit and it was 106/68. Overall, she states that she is doing well and has no further complaints.     Patient Active Problem List   Diagnosis Date Noted   Bilateral arm pain 08/31/2021   History of migraine headaches 02/22/2021   Abnormal finding on urinalysis 09/20/2020   Essential hypertension 08/24/2020   Memory impairment 08/24/2020   Bradycardia 08/09/2020   Elevated blood pressure reading 08/09/2020   Back pain 08/09/2020   Left shoulder pain 03/29/2020   Irregular heart beat 03/29/2020   Bilateral edema of lower extremity 03/08/2020   Acute pyelonephritis 12/09/2019   AKI (acute kidney injury) (HCC) 12/09/2019   Hydronephrosis with renal and ureteral calculus obstruction 12/09/2019   Peripheral neuropathy 03/19/2019   Blurry vision 01/27/2019   Hyperlipidemia associated with type 2 diabetes mellitus (HCC) 11/13/2018   Rash 11/13/2018   Headache  01/16/2018   Back pain 01/16/2018   Obesity, Class III, BMI 40-49.9 (morbid obesity) (HCC) 05/01/2016   Depression 05/01/2016   Metrorrhagia 11/10/2015   Type 2 diabetes mellitus with hyperglycemia (HCC) 03/10/2015   Anemia 03/10/2015   Edema 03/10/2015   Past Medical History:  Diagnosis Date   Depression    Diabetes mellitus without complication (HCC)    GERD (gastroesophageal reflux disease)    Headache    History of kidney stones    History of nephrolithiasis 2015   via CT imaging, including an obstructing stone   Hyperlipidemia    Hypertension 12/2019   per patient, she has never been treated for high blood pressure   Low back pain 12/2019   Neuromuscular disorder (HCC)    neuropathy   Past Surgical History:  Procedure Laterality Date   CHOLECYSTECTOMY  1998   CYSTOSCOPY WITH STENT PLACEMENT Right 12/09/2019   Procedure: CYSTOSCOPY WITH STENT PLACEMENT;  Surgeon: Geraline Knapp, MD;  Location: [facility] OR[facility];  Service: Urology;  Laterality: Right;   CYSTOSCOPY/RETROGRADE/URETEROSCOPY  12/29/2019   Procedure: CYSTOSCOPY/RETROGRADE/URETEROSCOPY;  Surgeon: Geraline Knapp, MD;  Location: [facility];  Service: Urology;;   Social History   Tobacco Use   Smoking status: Never   Smokeless tobacco: Never  Vaping Use   Vaping status: Never Used  Substance Use Topics   Alcohol use: No   Drug use: No   Social History   Socioeconomic History   Marital status: Single    Spouse name: Not on file   Number of children: Not on file  Years of education: Not on file   Highest education level: Not on file  Occupational History   Occupation: not currently working  Tobacco Use   Smoking status: Never   Smokeless tobacco: Never  Vaping Use   Vaping status: Never Used  Substance and Sexual Activity   Alcohol use: No   Drug use: No   Sexual activity: Never  Other Topics Concern   Not on file  Social History Narrative   - Needs a blood sugar monitor       Patient said she is  living with her mom and things are going ok. Mom provides for her food, transport, etc.       Patient wants to wait until next visit before any information is shared.    Social Drivers of Corporate investment banker Strain: Low Risk  (09/12/2023)   Overall Financial Resource Strain (CARDIA)    Difficulty of Paying Living Expenses: Not hard at all  Food Insecurity: No Food Insecurity (09/12/2023)   Hunger Vital Sign    Worried About Running Out of Food in the Last Year: Never true    Ran Out of Food in the Last Year: Never true  Transportation Needs: No Transportation Needs (09/12/2023)   PRAPARE - Administrator, Civil Service (Medical): No    Lack of Transportation (Non-Medical): No  Physical Activity: Inactive (09/12/2023)   Exercise Vital Sign    Days of Exercise per Week: 0 days    Minutes of Exercise per Session: 0 min  Stress: No Stress Concern Present (09/12/2023)   [employer] of Occupational Health - Occupational Stress Questionnaire    Feeling of Stress : Not at all  Social Connections: Moderately Isolated (09/12/2023)   Social Connection and Isolation Panel [NHANES]    Frequency of Communication with Friends and Family: More than three times a week    Frequency of Social Gatherings with Friends and Family: More than three times a week    Attends Religious Services: More than 4 times per year    Active Member of [employer] or Organizations: No    Attends Banker Meetings: Never    Marital Status: Never married  Intimate Partner Violence: Not At Risk (09/12/2023)   Humiliation, Afraid, Rape, and Kick questionnaire    Fear of Current or Ex-Partner: No    Emotionally Abused: No    Physically Abused: No    Sexually Abused: No   Family Status  Relation Name Status   Mother  Alive   Father  Deceased   Sister  Alive   MGM  Deceased   MGF  Deceased   PGM  Deceased   PGF  Deceased   Maternal GGM  Deceased  No partnership data on file   Family History   Problem Relation Age of Onset   Hypertension Mother    Heart disease Mother    Kidney failure Father    Sleep apnea Sister    Pancreatic cancer Maternal Grandmother    Liver disease Maternal Grandfather    Other Paternal Grandmother        unknown medical history   Other Paternal Grandfather        unknown medical history   Diabetes Maternal Great-grandmother    Allergies  Allergen Reactions   Penicillins Hives    Raised welts   Topiramate      Other reaction(s): Other (See Comments) Worsened migraines, N/V   Topiramate  Er Nausea And Vomiting    Intensified  Migraines    Valtrex  [Valacyclovir ] Itching    Diarrhea, abdominal pain, and possibly itching  Does not remember timing      Review of Systems  Constitutional: Negative.   Eyes: Negative.   Respiratory: Negative.    Cardiovascular: Negative.   Genitourinary: Negative.   Skin: Negative.   Neurological: Negative.   Endo/Heme/Allergies: Negative.   Psychiatric/Behavioral: Negative.        Objective:     BP 106/68   Pulse 63   Ht 5\' 8"  (1.727 m)   Wt 267 lb 14.4 oz (121.5 kg)   LMP 09/29/2018 (Approximate)   SpO2 97%   BMI 40.73 kg/m  BP Readings from Last 3 Encounters:  09/12/23 106/68  05/04/23 135/78  04/04/23 123/71   Wt Readings from Last 3 Encounters:  09/12/23 267 lb 14.4 oz (121.5 kg)  05/04/23 273 lb (123.8 kg)  04/04/23 273 lb 8 oz (124.1 kg)      Physical Exam HENT:     Head: Normocephalic and atraumatic.     Mouth/Throat:     Mouth: Mucous membranes are moist.     Pharynx: Oropharynx is clear.  Eyes:     Extraocular Movements: Extraocular movements intact.     Conjunctiva/sclera: Conjunctivae normal.     Pupils: Pupils are equal, round, and reactive to light.  Cardiovascular:     Rate and Rhythm: Normal rate and regular rhythm.     Pulses: Normal pulses.     Heart sounds: Normal heart sounds.  Pulmonary:     Effort: Pulmonary effort is normal.     Breath sounds: Normal  breath sounds.  Skin:    General: Skin is warm and dry.  Neurological:     General: No focal deficit present.     Mental Status: She is alert and oriented to person, place, and time.  Psychiatric:        Mood and Affect: Mood normal.        Behavior: Behavior normal.      Results for orders placed or performed in visit on 09/12/23  POCT Glucose (CBG)  Result Value Ref Range   POC Glucose 94 70 - 99 mg/dl  POCT HgB J8A  Result Value Ref Range   Hemoglobin A1C 6.4 (A) 4.0 - 5.6 %   HbA1c POC (<> result, manual entry)     HbA1c, POC (prediabetic range)     HbA1c, POC (controlled diabetic range)      Last CBC Lab Results  Component Value Date   WBC 7.2 03/28/2023   HGB 12.0 03/28/2023   HCT 40.6 03/28/2023   MCV 79 03/28/2023   MCH 23.3 (L) 03/28/2023   RDW 15.0 03/28/2023   PLT 390 03/28/2023   Last metabolic panel Lab Results  Component Value Date   GLUCOSE 92 03/28/2023   NA 141 03/28/2023   K 4.8 03/28/2023   CL 100 03/28/2023   CO2 22 03/28/2023   BUN 11 03/28/2023   CREATININE 0.90 03/28/2023   EGFR 78 03/28/2023   CALCIUM  10.2 03/28/2023   PHOS 4.3 08/31/2020   PROT 8.1 10/25/2022   ALBUMIN 4.6 10/25/2022   LABGLOB 3.5 10/25/2022   AGRATIO 1.3 10/25/2022   BILITOT 0.4 10/25/2022   ALKPHOS 145 (H) 10/25/2022   AST 14 10/25/2022   ALT 12 10/25/2022   ANIONGAP 10 01/02/2022   Last lipids Lab Results  Component Value Date   CHOL 181 10/25/2022   HDL 33 (L) 10/25/2022   LDLCALC 127 (H)  10/25/2022   TRIG 116 10/25/2022   CHOLHDL 5.5 (H) 10/25/2022   Last hemoglobin A1c Lab Results  Component Value Date   HGBA1C 6.4 (A) 09/12/2023   Last thyroid  functions Lab Results  Component Value Date   TSH 1.990 01/27/2021   T4TOTAL 5.9 10/27/2015   Last vitamin D  No results found for: "25OHVITD2", "25OHVITD3", "VD25OH" Last vitamin B12 and Folate Lab Results  Component Value Date   VITAMINB12 410 01/16/2022   FOLATE 7.7 01/16/2022      The  10-year ASCVD risk score (Arnett DK, et al., 2019) is: 7%    Assessment & Plan:  1. Type 2 diabetes mellitus with diabetic polyneuropathy, with long-term current use of insulin  (HCC) (Primary) Her diabetes is well controlled. Her HgbA1C is within target range of under 7%. She was encouraged to continue to check her blood glucose daily and educated on the importance of following a low carbohydrate/non concentrated sweets diet and exercise as tolerated.  - Microalbumin / creatinine urine ratio; Future - empagliflozin  (JARDIANCE ) 10 MG TABS tablet; Take 1 tablet (10 mg total) by mouth daily before breakfast.  Dispense: 90 tablet; Refill: 0 - Semaglutide , 1 MG/DOSE, 4 MG/3ML SOPN; Inject 0.5 mg into the skin once a week. Start 1 mg on the 9th week  Dispense: 3 mL; Refill: 2  2. Essential hypertension Her blood pressure is under control, will continue current medication, DASH diet and exercise as tolerated. - lisinopril  (ZESTRIL ) 5 MG tablet; Take 1 tablet (5 mg total) by mouth daily.  Dispense: 90 tablet; Refill: 0 - metoprolol  succinate (TOPROL -XL) 25 MG 24 hr tablet; Take 1 tablet (25 mg total) by mouth once daily.  Dispense: 90 tablet; Refill: 1  3. Hyperlipidemia associated with type 2 diabetes mellitus (HCC) She will continue on a low fat diet and will check Lipid. - Lipid panel; Future       Return in about 3 months (around 12/13/2023).    [name] [name], NP

## 2023-09-13 ENCOUNTER — Other Ambulatory Visit: Payer: Self-pay

## 2023-09-13 LAB — LIPID PANEL
Chol/HDL Ratio: 4.9 ratio — ABNORMAL HIGH (ref 0.0–4.4)
Cholesterol, Total: 180 mg/dL (ref 100–199)
HDL: 37 mg/dL — ABNORMAL LOW (ref >39)
LDL Chol Calc (NIH): 120 mg/dL — ABNORMAL HIGH (ref 0–99)
Triglycerides: 129 mg/dL (ref 0–149)
VLDL Cholesterol Cal: 23 mg/dL (ref 5–40)

## 2023-09-16 ENCOUNTER — Other Ambulatory Visit: Payer: Self-pay

## 2023-09-26 ENCOUNTER — Other Ambulatory Visit: Payer: Self-pay

## 2023-10-04 ENCOUNTER — Other Ambulatory Visit: Payer: Self-pay

## 2023-10-08 ENCOUNTER — Other Ambulatory Visit: Payer: Self-pay

## 2023-10-10 ENCOUNTER — Other Ambulatory Visit: Payer: Self-pay

## 2023-10-10 MED ORDER — OZEMPIC (0.25 OR 0.5 MG/DOSE) 2 MG/3ML ~~LOC~~ SOPN
0.5000 mg | PEN_INJECTOR | SUBCUTANEOUS | 3 refills | Status: AC
  Filled 2023-10-20: qty 12, 112d supply, fill #0
  Filled 2024-02-05 – 2024-04-17 (×3): qty 12, 112d supply, fill #1
  Filled 2024-04-17: qty 3, 28d supply, fill #1
  Filled 2024-05-17: qty 3, 28d supply, fill #2

## 2023-10-14 ENCOUNTER — Other Ambulatory Visit: Payer: Self-pay

## 2023-10-17 ENCOUNTER — Other Ambulatory Visit: Payer: Self-pay

## 2023-10-17 MED ORDER — EMPAGLIFLOZIN 10 MG PO TABS
10.0000 mg | ORAL_TABLET | Freq: Every day | ORAL | 3 refills | Status: AC
  Filled 2023-10-20 – 2024-01-25 (×3): qty 90, 90d supply, fill #0
  Filled 2024-05-17 (×2): qty 90, 90d supply, fill #1

## 2023-10-20 ENCOUNTER — Other Ambulatory Visit: Payer: Self-pay

## 2023-10-21 ENCOUNTER — Other Ambulatory Visit: Payer: Self-pay

## 2023-11-04 ENCOUNTER — Other Ambulatory Visit: Payer: Self-pay

## 2023-11-21 ENCOUNTER — Other Ambulatory Visit: Payer: Self-pay

## 2023-12-06 ENCOUNTER — Other Ambulatory Visit: Payer: Self-pay

## 2023-12-06 ENCOUNTER — Other Ambulatory Visit: Payer: Self-pay | Admitting: Gerontology

## 2023-12-06 DIAGNOSIS — I10 Essential (primary) hypertension: Secondary | ICD-10-CM

## 2023-12-06 MED FILL — "Insulin Pen Needle 32 G X 6 MM (1/4"" or 15/64"")": 100 days supply | Qty: 100 | Fill #2 | Status: CN

## 2023-12-07 ENCOUNTER — Other Ambulatory Visit: Payer: Self-pay

## 2023-12-08 ENCOUNTER — Other Ambulatory Visit: Payer: Self-pay

## 2023-12-09 ENCOUNTER — Other Ambulatory Visit: Payer: Self-pay | Admitting: Gerontology

## 2023-12-09 ENCOUNTER — Other Ambulatory Visit: Payer: Self-pay

## 2023-12-09 DIAGNOSIS — I10 Essential (primary) hypertension: Secondary | ICD-10-CM

## 2023-12-10 ENCOUNTER — Other Ambulatory Visit: Payer: Self-pay

## 2023-12-10 MED FILL — Lisinopril Tab 10 MG: ORAL | 90 days supply | Qty: 45 | Fill #0 | Status: AC

## 2023-12-10 MED FILL — "Insulin Pen Needle 32 G X 6 MM (1/4"" or 15/64"")": Qty: 400 | Fill #0 | Status: CN

## 2023-12-10 MED FILL — "Insulin Pen Needle 32 G X 6 MM (1/4"" or 15/64"")": 400 days supply | Qty: 400 | Fill #0 | Status: AC

## 2023-12-11 ENCOUNTER — Other Ambulatory Visit: Payer: Self-pay

## 2023-12-12 ENCOUNTER — Ambulatory Visit: Payer: Self-pay | Admitting: Gerontology

## 2023-12-12 ENCOUNTER — Other Ambulatory Visit: Payer: Self-pay

## 2023-12-12 ENCOUNTER — Encounter: Payer: Self-pay | Admitting: Gerontology

## 2023-12-12 VITALS — BP 115/74 | HR 75 | Ht 69.69 in | Wt 270.1 lb

## 2023-12-12 DIAGNOSIS — I10 Essential (primary) hypertension: Secondary | ICD-10-CM

## 2023-12-12 DIAGNOSIS — E1142 Type 2 diabetes mellitus with diabetic polyneuropathy: Secondary | ICD-10-CM

## 2023-12-12 LAB — POCT GLYCOSYLATED HEMOGLOBIN (HGB A1C): Hemoglobin A1C: 7 % — AB (ref 4.0–5.6)

## 2023-12-12 LAB — GLUCOSE, POCT (MANUAL RESULT ENTRY): POC Glucose: 180 mg/dL — AB (ref 70–99)

## 2023-12-12 MED ORDER — LISINOPRIL 10 MG PO TABS
5.0000 mg | ORAL_TABLET | Freq: Every day | ORAL | 1 refills | Status: AC
  Filled 2023-12-12 – 2024-03-13 (×4): qty 45, 90d supply, fill #0
  Filled 2024-05-17 – 2024-06-05 (×2): qty 45, 90d supply, fill #1

## 2023-12-12 NOTE — Patient Instructions (Signed)

## 2023-12-12 NOTE — Progress Notes (Signed)
 Established Patient Office Visit  Subjective   Patient ID: [name], female    DOB: 04-Apr-1976  Age: 48 y.o. MRN: [id number]  No chief complaint on file.   HPI  [name] is a 48 year-old female with a history of Depression, Diabetes, GERD, Headache, Nephrolithiasis, Hyperlipidemia, Hypertension, Neuromuscular disorder, and chronic low back pain who presents for routine follow up visit.  She reports she is taking her medication as prescribed, denies side effects and continues to make healthy lifestyle changes.  Her hemoglobin A1c checked during visit increased from 6.4% to 7% and her blood glucose was 180 mg/dl.  She reports checking her blood glucose bid, states that her fasting readings are usually less than 120 mg per DL and it was 98 milligrams per DL yesterday.  She denies hypo-/hyperglycemic symptoms, peripheral neuropathy and performs daily foot checks.  Overall, she states that she is doing well and offers no further complaints.  Review of Systems  Constitutional: Negative.   Eyes: Negative.   Respiratory: Negative.    Cardiovascular: Negative.   Skin: Negative.   Neurological: Negative.   Endo/Heme/Allergies: Negative.   Psychiatric/Behavioral: Negative.        Objective:     BP 115/74 (BP Location: Left Arm, Patient Position: Sitting, Cuff Size: Large)   Pulse 75   Ht 5' 9.69 (1.77 m)   Wt 270 lb 1.6 oz (122.5 kg)   LMP 09/29/2018 (Approximate)   BMI 39.11 kg/m  BP Readings from Last 3 Encounters:  12/12/23 115/74  09/12/23 106/68  05/04/23 135/78   Wt Readings from Last 3 Encounters:  12/12/23 270 lb 1.6 oz (122.5 kg)  09/12/23 267 lb 14.4 oz (121.5 kg)  05/04/23 273 lb (123.8 kg)      Physical Exam HENT:     Head: Normocephalic and atraumatic.     Mouth/Throat:     Mouth: Mucous membranes are moist.  Eyes:     Extraocular Movements: Extraocular movements intact.     Conjunctiva/sclera: Conjunctivae normal.     Pupils:  Pupils are equal, round, and reactive to light.  Cardiovascular:     Rate and Rhythm: Normal rate and regular rhythm.     Pulses: Normal pulses.     Heart sounds: Normal heart sounds.  Pulmonary:     Effort: Pulmonary effort is normal.     Breath sounds: Normal breath sounds.  Skin:    General: Skin is warm.     Capillary Refill: Capillary refill takes less than 2 seconds.  Neurological:     General: No focal deficit present.     Mental Status: She is alert and oriented to person, place, and time.  Psychiatric:        Mood and Affect: Mood normal.        Behavior: Behavior normal.        Thought Content: Thought content normal.        Judgment: Judgment normal.      Results for orders placed or performed in visit on 12/12/23  POCT Glucose (CBG)  Result Value Ref Range   POC Glucose 180 (A) 70 - 99 mg/dl  POCT HgB J8R  Result Value Ref Range   Hemoglobin A1C 7.0 (A) 4.0 - 5.6 %   HbA1c POC (<> result, manual entry)     HbA1c, POC (prediabetic range)     HbA1c, POC (controlled diabetic range)      Last CBC Lab Results  Component Value Date   WBC  7.2 03/28/2023   HGB 12.0 03/28/2023   HCT 40.6 03/28/2023   MCV 79 03/28/2023   MCH 23.3 (L) 03/28/2023   RDW 15.0 03/28/2023   PLT 390 03/28/2023   Last metabolic panel Lab Results  Component Value Date   GLUCOSE 92 03/28/2023   NA 141 03/28/2023   K 4.8 03/28/2023   CL 100 03/28/2023   CO2 22 03/28/2023   BUN 11 03/28/2023   CREATININE 0.90 03/28/2023   EGFR 78 03/28/2023   CALCIUM  10.2 03/28/2023   PHOS 4.3 08/31/2020   PROT 8.1 10/25/2022   ALBUMIN 4.6 10/25/2022   LABGLOB 3.5 10/25/2022   AGRATIO 1.3 10/25/2022   BILITOT 0.4 10/25/2022   ALKPHOS 145 (H) 10/25/2022   AST 14 10/25/2022   ALT 12 10/25/2022   ANIONGAP 10 01/02/2022   Last lipids Lab Results  Component Value Date   CHOL 180 09/12/2023   HDL 37 (L) 09/12/2023   LDLCALC 120 (H) 09/12/2023   TRIG 129 09/12/2023   CHOLHDL 4.9 (H)  09/12/2023   Last hemoglobin A1c Lab Results  Component Value Date   HGBA1C 7.0 (A) 12/12/2023   Last thyroid  functions Lab Results  Component Value Date   TSH 1.990 01/27/2021   T4TOTAL 5.9 10/27/2015   Last vitamin D  No results found for: 25OHVITD2, 25OHVITD3, VD25OH Last vitamin B12 and Folate Lab Results  Component Value Date   VITAMINB12 410 01/16/2022   FOLATE 7.7 01/16/2022      The 10-year ASCVD risk score (Arnett DK, et al., 2019) is: 8.2%    Assessment & Plan:   1. Type 2 diabetes mellitus with diabetic polyneuropathy, with long-term current use of insulin  (HCC) (Primary) -Diabetes has improved, her hemoglobin A1c at 7%.  She will continue current medication, low carbohydrate/non concentrated sweet diet, and exercise as tolerated. - POCT HgB A1C; Future - POCT Glucose (CBG); Future - POCT Glucose (CBG) - POCT HgB A1C  2. Essential hypertension -Her blood pressure is under control, she will continue current medication, DASH diet and exercise as tolerated. - lisinopril  (ZESTRIL ) 10 MG tablet; Take 0.5 tablets (5 mg total) by mouth daily.  Dispense: 45 tablet; Refill: 1    Return in about 13 weeks (around 03/12/2024), or if symptoms worsen or fail to improve.    Jerimey Burridge E Faizon Capozzi, NP

## 2023-12-23 ENCOUNTER — Other Ambulatory Visit: Payer: Self-pay

## 2023-12-26 ENCOUNTER — Other Ambulatory Visit: Payer: Self-pay

## 2024-01-25 ENCOUNTER — Other Ambulatory Visit: Payer: Self-pay

## 2024-01-27 ENCOUNTER — Other Ambulatory Visit: Payer: Self-pay

## 2024-01-30 ENCOUNTER — Other Ambulatory Visit: Payer: Self-pay

## 2024-02-04 ENCOUNTER — Other Ambulatory Visit: Payer: Self-pay

## 2024-02-05 ENCOUNTER — Other Ambulatory Visit: Payer: Self-pay

## 2024-02-08 ENCOUNTER — Other Ambulatory Visit: Payer: Self-pay

## 2024-02-13 ENCOUNTER — Other Ambulatory Visit: Payer: Self-pay

## 2024-02-19 ENCOUNTER — Other Ambulatory Visit: Payer: Self-pay

## 2024-02-20 ENCOUNTER — Other Ambulatory Visit: Payer: Self-pay

## 2024-02-28 ENCOUNTER — Other Ambulatory Visit: Payer: Self-pay

## 2024-03-02 ENCOUNTER — Other Ambulatory Visit: Payer: Self-pay

## 2024-03-03 ENCOUNTER — Other Ambulatory Visit: Payer: Self-pay

## 2024-03-12 ENCOUNTER — Telehealth: Payer: Self-pay

## 2024-03-12 ENCOUNTER — Other Ambulatory Visit: Payer: Self-pay

## 2024-03-12 ENCOUNTER — Ambulatory Visit: Payer: Self-pay | Admitting: Gerontology

## 2024-03-12 NOTE — Telephone Encounter (Signed)
 Called to R/s appt for pt. No answer. Left msg

## 2024-03-12 NOTE — Telephone Encounter (Signed)
-----   Message from Gregg FORBES Hockey sent at 03/12/2024 10:43 AM EDT ----- Pls call and reschedule appointment for patient, no showed , pls make a telephone note. Thank you

## 2024-03-13 ENCOUNTER — Other Ambulatory Visit: Payer: Self-pay

## 2024-03-23 ENCOUNTER — Other Ambulatory Visit: Payer: Self-pay

## 2024-03-24 ENCOUNTER — Other Ambulatory Visit: Payer: Self-pay

## 2024-03-25 ENCOUNTER — Other Ambulatory Visit: Payer: Self-pay | Admitting: Physician Assistant

## 2024-03-25 DIAGNOSIS — Z87442 Personal history of urinary calculi: Secondary | ICD-10-CM

## 2024-03-26 ENCOUNTER — Ambulatory Visit
Admission: RE | Admit: 2024-03-26 | Discharge: 2024-03-26 | Disposition: A | Discharge status: Home / Self Care | Payer: Self-pay | Source: Ambulatory Visit | Attending: Physician Assistant | Admitting: Physician Assistant

## 2024-03-26 ENCOUNTER — Ambulatory Visit (INDEPENDENT_AMBULATORY_CARE_PROVIDER_SITE_OTHER): Payer: Self-pay | Admitting: Physician Assistant

## 2024-03-26 ENCOUNTER — Other Ambulatory Visit: Payer: Self-pay

## 2024-03-26 VITALS — BP 126/69 | HR 77 | Ht 68.0 in | Wt 283.0 lb

## 2024-03-26 DIAGNOSIS — N200 Calculus of kidney: Secondary | ICD-10-CM

## 2024-03-26 DIAGNOSIS — Z87442 Personal history of urinary calculi: Secondary | ICD-10-CM

## 2024-03-26 MED ORDER — POTASSIUM CITRATE ER 10 MEQ (1080 MG) PO TBCR
10.0000 meq | EXTENDED_RELEASE_TABLET | Freq: Two times a day (BID) | ORAL | 11 refills | Status: AC
  Filled 2024-03-26 – 2024-04-17 (×2): qty 60, 30d supply, fill #0
  Filled 2024-06-08: qty 60, 30d supply, fill #1

## 2024-03-26 NOTE — Progress Notes (Signed)
 03/26/2024 9:43 AM   Marval Nat Cornish [birth year] [id number]  CC: Chief Complaint  Patient presents with   Follow-up   Nephrolithiasis   HPI: [name] is a 48 y.o. female with PMH diabetes and nephrolithiasis on potassium citrate  10 mEq twice daily who presents today for annual follow-up.   Today she reports no flank pain, gross hematuria, or stones passed in the last year.  She is tolerating potassium citrate  without difficulty.  She has been having issues with chronic low back pain and seeing neurosurgery  KUB today with stable bilateral renal stones.  PMH: Past Medical History:  Diagnosis Date   Depression    Diabetes mellitus without complication (HCC)    GERD (gastroesophageal reflux disease)    Headache    History of kidney stones    History of nephrolithiasis 2015   via CT imaging, including an obstructing stone   Hyperlipidemia    Hypertension 12/2019   per patient, she has never been treated for high blood pressure   Low back pain 12/2019   Neuromuscular disorder (HCC)    neuropathy    Surgical History: Past Surgical History:  Procedure Laterality Date   CHOLECYSTECTOMY  1998   CYSTOSCOPY WITH STENT PLACEMENT Right 12/09/2019   Procedure: CYSTOSCOPY WITH STENT PLACEMENT;  Surgeon: Twylla Glendia BROCKS, MD;  Location: [facility];  Service: Urology;  Laterality: Right;   CYSTOSCOPY/RETROGRADE/URETEROSCOPY  12/29/2019   Procedure: CYSTOSCOPY/RETROGRADE/URETEROSCOPY;  Surgeon: Twylla Glendia BROCKS, MD;  Location: [facility];  Service: Urology;;    Home Medications:  Allergies as of 03/26/2024       Reactions   Penicillins Hives   Raised welts   Topiramate     Other reaction(s): Other (See Comments) Worsened migraines, N/V   Topiramate  Er Nausea And Vomiting   Intensified Migraines   Valtrex  [valacyclovir ] Itching   Diarrhea, abdominal pain, and possibly itching Does not remember timing        Medication List        Accurate as of March 26, 2024  9:43 AM. If you have any questions, ask your nurse or doctor.          Basaglar  KwikPen 100 UNIT/ML Inject 40 Units into the skin at bedtime.   Comfort EZ Pen Needles 31G X 6 MM Misc Generic drug: Insulin  Pen Needle use with insulin  pens   Novofine Pen Needle 32G X 6 MM Misc Generic drug: Insulin  Pen Needle Inject 1 Needle into the skin as directed.   TechLite Pen Needles 32G X 6 MM Misc Generic drug: Insulin  Pen Needle use with Basaglar    Novofine Pen Needle 32G X 6 MM Misc Generic drug: Insulin  Pen Needle Inject 1 Needle into the skin as directed.   cyclobenzaprine  5 MG tablet Commonly known as: FLEXERIL  Take 1 tablet (5 mg total) by mouth 3 (three) times daily as needed.   Jardiance  10 MG Tabs tablet Generic drug: empagliflozin  Take 1 tablet (10 mg total) by mouth daily with breakfast.   lisinopril  10 MG tablet Commonly known as: ZESTRIL  Take 0.5 tablets (5 mg total) by mouth daily. Needs appointment  for Office visit.   metoprolol  succinate 25 MG 24 hr tablet Commonly known as: TOPROL -XL Take 1 tablet (25 mg total) by mouth once daily.   Ozempic  (0.25 or 0.5 MG/DOSE) 2 MG/3ML Sopn Generic drug: Semaglutide (0.25 or 0.5MG /DOS) Inject 0.5 mg into the skin once a week.   Ozempic  (0.25 or 0.5 MG/DOSE) 2 MG/3ML Sopn Generic drug: Semaglutide (0.25 or 0.5MG /DOS)  Inject 0.5 mg into the skin once a week.   potassium citrate  10 MEQ (1080 MG) SR tablet Commonly known as: UROCIT-K  Take 1 tablet (10 mEq total) by mouth 2 (two) times daily with a meal.        Allergies:  Allergies  Allergen Reactions   Penicillins Hives    Raised welts   Topiramate      Other reaction(s): Other (See Comments) Worsened migraines, N/V   Topiramate  Er Nausea And Vomiting    Intensified Migraines    Valtrex  [Valacyclovir ] Itching    Diarrhea, abdominal pain, and possibly itching  Does not remember timing    Family History: Family History  Problem Relation Age of  Onset   Hypertension Mother    Heart disease Mother    Kidney failure Father    Sleep apnea Sister    Pancreatic cancer Maternal Grandmother    Liver disease Maternal Grandfather    Other Paternal Grandmother        unknown medical history   Other Paternal Grandfather        unknown medical history   Diabetes Maternal Great-grandmother     Social History:   reports that she has never smoked. She has never used smokeless tobacco. She reports that she does not drink alcohol and does not use drugs.  Physical Exam: LMP 09/29/2018 (Approximate)   Constitutional:  Alert and oriented, no acute distress, nontoxic appearing HEENT: [place], AT Cardiovascular: No clubbing, cyanosis, or edema Respiratory: Normal respiratory effort, no increased work of breathing Skin: No rashes, bruises or suspicious lesions Neurologic: Grossly intact, no focal deficits, moving all 4 extremities Psychiatric: Normal mood and affect  Pertinent Imaging: KUB, 03/26/2024: See epic  I personally reviewed the images referenced above and note stable bilateral renal stones.  Assessment & Plan:   1. Nephrolithiasis (Primary) Asymptomatic, KUB stable.  Will check annual labs on potassium citrate  today and contact her with results.  Will see her back next year for annual follow-up with KUB prior. - Basic metabolic panel with GFR - CBC - potassium citrate  (UROCIT-K ) 10 MEQ (1080 MG) SR tablet; Take 1 tablet (10 mEq total) by mouth 2 (two) times daily with a meal.  Dispense: 60 tablet; Refill: 11   Return in about 1 year (around 03/26/2025) for Annual stone visit with KUB prior.  Lucie Hones, PA-C  [facility] [place] [address] [place], [place] [address] [phone]

## 2024-03-27 ENCOUNTER — Ambulatory Visit: Payer: Self-pay | Admitting: Physician Assistant

## 2024-03-27 LAB — BASIC METABOLIC PANEL WITH GFR
BUN/Creatinine Ratio: 13 (ref 9–23)
BUN: 13 mg/dL (ref 6–24)
CO2: 24 mmol/L (ref 20–29)
Calcium: 9.8 mg/dL (ref 8.7–10.2)
Chloride: 98 mmol/L (ref 96–106)
Creatinine, Ser: 1 mg/dL (ref 0.57–1.00)
Glucose: 205 mg/dL — ABNORMAL HIGH (ref 70–99)
Potassium: 4.4 mmol/L (ref 3.5–5.2)
Sodium: 137 mmol/L (ref 134–144)
eGFR: 69 mL/min/1.73 (ref >59)

## 2024-03-27 LAB — CBC
Hematocrit: 39.3 % (ref 34.0–46.6)
Hemoglobin: 11.8 g/dL (ref 11.1–15.9)
MCH: 23.7 pg — ABNORMAL LOW (ref 26.6–33.0)
MCHC: 30 g/dL — ABNORMAL LOW (ref 31.5–35.7)
MCV: 79 fL (ref 79–97)
Platelets: 374 x10E3/uL (ref 150–450)
RBC: 4.97 x10E6/uL (ref 3.77–5.28)
RDW: 14.2 % (ref 11.7–15.4)
WBC: 7.8 x10E3/uL (ref 3.4–10.8)

## 2024-04-02 ENCOUNTER — Telehealth: Payer: Self-pay

## 2024-04-02 NOTE — Telephone Encounter (Signed)
-----   Message from [name] Hockey sent at 04/02/2024 11:18 AM EST ----- Pls schedule an appointment and make telephone note, thank you

## 2024-04-02 NOTE — Telephone Encounter (Signed)
 Called pt to make appt. No answer. Left msg.

## 2024-04-03 ENCOUNTER — Telehealth: Payer: Self-pay

## 2024-04-06 ENCOUNTER — Telehealth: Payer: Self-pay

## 2024-04-06 NOTE — Telephone Encounter (Signed)
 Error

## 2024-04-08 ENCOUNTER — Other Ambulatory Visit: Payer: Self-pay

## 2024-04-16 ENCOUNTER — Emergency Department: Payer: Self-pay

## 2024-04-16 ENCOUNTER — Other Ambulatory Visit: Payer: Self-pay

## 2024-04-16 ENCOUNTER — Emergency Department
Admission: EM | Admit: 2024-04-16 | Discharge: 2024-04-16 | Disposition: A | Discharge status: Home / Self Care | Payer: Self-pay | Attending: Emergency Medicine | Admitting: Emergency Medicine

## 2024-04-16 DIAGNOSIS — I10 Essential (primary) hypertension: Secondary | ICD-10-CM | POA: Insufficient documentation

## 2024-04-16 DIAGNOSIS — N132 Hydronephrosis with renal and ureteral calculous obstruction: Secondary | ICD-10-CM | POA: Insufficient documentation

## 2024-04-16 DIAGNOSIS — N200 Calculus of kidney: Secondary | ICD-10-CM

## 2024-04-16 DIAGNOSIS — E119 Type 2 diabetes mellitus without complications: Secondary | ICD-10-CM | POA: Insufficient documentation

## 2024-04-16 LAB — URINALYSIS, ROUTINE W REFLEX MICROSCOPIC
Bilirubin Urine: NEGATIVE
Glucose, UA: >=500 mg/dL — AB
Ketones, ur: 5 mg/dL — AB
Leukocytes,Ua: NEGATIVE
Nitrite: NEGATIVE
Protein, ur: 30 mg/dL — AB
RBC / HPF: >50 RBC/hpf (ref 0–5)
Specific Gravity, Urine: 1.02 (ref 1.005–1.030)
pH: 5 (ref 5.0–8.0)

## 2024-04-16 LAB — CBC
HCT: 38.5 % (ref 36.0–46.0)
Hemoglobin: 12.4 g/dL (ref 12.0–15.0)
MCH: 23.5 pg — ABNORMAL LOW (ref 26.0–34.0)
MCHC: 32.2 g/dL (ref 30.0–36.0)
MCV: 73.1 fL — ABNORMAL LOW (ref 80.0–100.0)
Platelets: 340 K/uL (ref 150–400)
RBC: 5.27 MIL/uL — ABNORMAL HIGH (ref 3.87–5.11)
RDW: 14.6 % (ref 11.5–15.5)
WBC: 10.2 K/uL (ref 4.0–10.5)
nRBC: 0 % (ref 0.0–0.2)

## 2024-04-16 LAB — BASIC METABOLIC PANEL WITH GFR
Anion gap: 15 (ref 5–15)
BUN: 15 mg/dL (ref 6–20)
CO2: 23 mmol/L (ref 22–32)
Calcium: 9.7 mg/dL (ref 8.9–10.3)
Chloride: 103 mmol/L (ref 98–111)
Creatinine, Ser: 1.09 mg/dL — ABNORMAL HIGH (ref 0.44–1.00)
GFR, Estimated: >60 mL/min (ref >60)
Glucose, Bld: 216 mg/dL — ABNORMAL HIGH (ref 70–99)
Potassium: 4.4 mmol/L (ref 3.5–5.1)
Sodium: 140 mmol/L (ref 135–145)

## 2024-04-16 MED ORDER — ONDANSETRON 4 MG PO TBDP
4.0000 mg | ORAL_TABLET | Freq: Once | ORAL | Status: AC
  Administered 2024-04-16: 4 mg via ORAL
  Filled 2024-04-16: qty 1

## 2024-04-16 MED ORDER — HYDROCODONE-ACETAMINOPHEN 5-325 MG PO TABS
1.0000 | ORAL_TABLET | Freq: Three times a day (TID) | ORAL | 0 refills | Status: AC | PRN
  Filled 2024-04-16: qty 9, 3d supply, fill #0

## 2024-04-16 MED ORDER — OXYCODONE-ACETAMINOPHEN 5-325 MG PO TABS
1.0000 | ORAL_TABLET | Freq: Once | ORAL | Status: AC
  Administered 2024-04-16: 1 via ORAL
  Filled 2024-04-16: qty 1

## 2024-04-16 NOTE — ED Provider Notes (Signed)
 [facility] Emergency Department Provider Note     Event Date/Time   First MD Initiated Contact with Patient 04/16/24 2053     (approximate)   History   Back Pain   HPI  [name] is a 48 y.o. female with a history of DM type II, GERD, HLD, HTN, and kidney stones, presents to the ED endorsing low back pains with some associated vomiting.  She reports symptoms that felt that she was passing a kidney stone.  She presents to the ED in no acute distress for evaluation of her symptoms.  Physical Exam   Triage Vital Signs: ED Triage Vitals  Encounter Vitals Group     BP 04/16/24 1852 (!) 132/96     Girls Systolic BP Percentile --      Girls Diastolic BP Percentile --      Boys Systolic BP Percentile --      Boys Diastolic BP Percentile --      Pulse Rate 04/16/24 1852 77     Resp 04/16/24 1852 20     Temp 04/16/24 1851 98.3 F (36.8 C)     Temp src --      SpO2 04/16/24 1852 98 %     Weight --      Height --      Head Circumference --      Peak Flow --      Pain Score 04/16/24 1854 10     Pain Loc --      Pain Education --      Exclude from Growth Chart --     Most recent vital signs: Vitals:   04/16/24 1851 04/16/24 1852  BP:  (!) 132/96  Pulse:  77  Resp:  20  Temp: 98.3 F (36.8 C) 98.3 F (36.8 C)  SpO2:  98%    General Awake, no distress.  NAD HEENT NCAT. PERRL. EOMI. No rhinorrhea. Mucous membranes are moist.  CV:  Good peripheral perfusion.  RESP:  Normal effort.  ABD:  No distention.  Soft and nontender.   ED Results / Procedures / Treatments   Labs (all labs ordered are listed, but only abnormal results are displayed) Labs Reviewed  URINALYSIS, ROUTINE W REFLEX MICROSCOPIC - Abnormal; Notable for the following components:      Result Value   Color, Urine YELLOW (*)    APPearance HAZY (*)    Glucose, UA >=500 (*)    Hgb urine dipstick LARGE (*)    Ketones, ur 5 (*)    Protein, ur 30 (*)    Bacteria,  UA FEW (*)    All other components within normal limits  CBC - Abnormal; Notable for the following components:   RBC 5.27 (*)    MCV 73.1 (*)    MCH 23.5 (*)    All other components within normal limits  BASIC METABOLIC PANEL WITH GFR - Abnormal; Notable for the following components:   Glucose, Bld 216 (*)    Creatinine, Ser 1.09 (*)    All other components within normal limits     EKG   RADIOLOGY  I personally viewed and evaluated these images as part of my medical decision making, as well as reviewing the written report by the radiologist.  ED Provider Interpretation: A left millimeter calculus in the inferior right kidney as well as evidence of a recently passed 3 mL stone into the bladder  CT Renal Stone Study Result Date: 04/16/2024 CLINICAL DATA:  Abdominal and flank pain. EXAM: CT ABDOMEN AND PELVIS WITHOUT CONTRAST TECHNIQUE: Multidetector CT imaging of the abdomen and pelvis was performed following the standard protocol without IV contrast. RADIATION DOSE REDUCTION: This exam was performed according to the departmental dose-optimization program which includes automated exposure control, adjustment of the mA and/or kV according to patient size and/or use of iterative reconstruction technique. COMPARISON:  CT abdomen and pelvis 08/09/2020 FINDINGS: Lower chest: No acute abnormality. Hepatobiliary: No focal liver abnormality is seen. Status post cholecystectomy. No biliary dilatation. Pancreas: Unremarkable. No pancreatic ductal dilatation or surrounding inflammatory changes. Spleen: Normal in size without focal abnormality. Adrenals/Urinary Tract: There is a 3 mm calculus in the posterior right bladder. There is mild right-sided hydroureteronephrosis and mild right perinephric fat stranding. There is a 7 mm calculus in the inferior pole the right kidney. There is a subcentimeter hypodensity in the superior pole the left kidney which is too small to characterize, likely a cyst.  Otherwise, the left kidney, adrenal glands and bladder are within normal limits. Stomach/Bowel: Stomach is within normal limits. Appendix appears normal. No evidence of bowel wall thickening, distention, or inflammatory changes. There are scattered colonic diverticula. Vascular/Lymphatic: No significant vascular findings are present. No enlarged abdominal or pelvic lymph nodes. Reproductive: Uterus and bilateral adnexa are unremarkable. Other: No abdominal wall hernia or abnormality. No abdominopelvic ascites. Musculoskeletal: No fracture is seen. IMPRESSION: 1. 3 mm calculus in the posterior right bladder with mild right-sided hydroureteronephrosis and perinephric fat stranding. Findings may be related to recently passed stone. Correlate clinically for infection. 2. Nonobstructing right renal calculus. 3. Colonic diverticulosis. Electronically Signed   By: Greig Pique M.D.   On: 04/16/2024 19:54     PROCEDURES:  Critical Care performed: No  Procedures   MEDICATIONS ORDERED IN ED: Medications  ondansetron  (ZOFRAN -ODT) disintegrating tablet 4 mg (4 mg Oral Given 04/16/24 2118)  oxyCODONE -acetaminophen  (PERCOCET/ROXICET) 5-325 MG per tablet 1 tablet (1 tablet Oral Given 04/16/24 2118)     IMPRESSION / MDM / ASSESSMENT AND PLAN / ED COURSE  I reviewed the triage vital signs and the nursing notes.                              Differential diagnosis includes, but is not limited to, ovarian cyst, ovarian torsion, acute appendicitis, diverticulitis, urinary tract infection/pyelonephritis, endometriosis, bowel obstruction, colitis, renal colic, gastroenteritis, hernia, fibroids, pregnancy related pain including ectopic pregnancy, etc.  Patient's presentation is most consistent with acute complicated illness / injury requiring diagnostic workup.  Patient's diagnosis is consistent with recent passage of a kidney stone into the bladder.  Kidney stone passes is confirmed by CT renal stone study, with  images turbid by me.  No evidence of any hydronephrosis.  3 mL stone is noted in the bladder on CT imaging.  Patient stable at this time without any significant complaints of pain.  Patient will be discharged home with prescriptions for hydrocodone  [place]). Patient is to follow up with her urologist as suggested, as needed or otherwise directed. Patient is given ED precautions to return to the ED for any worsening or new symptoms.     FINAL CLINICAL IMPRESSION(S) / ED DIAGNOSES   Final diagnoses:  Kidney stones     Rx / DC Orders   ED Discharge Orders          Ordered    HYDROcodone -acetaminophen  (NORCO/VICODIN) 5-325 MG tablet  3 times daily PRN  04/16/24 2201             Note:  This document was prepared using Dragon voice recognition software and may include unintentional dictation errors.    Loyd Candida LULLA Aldona, PA-C 04/16/24 2315    Claudene Rover, MD 04/16/24 [phone]

## 2024-04-16 NOTE — ED Triage Notes (Signed)
 Patient states low back pain, vomiting and feels like she is passing a kidney stone.

## 2024-04-16 NOTE — Discharge Instructions (Addendum)
 You appear to have passed a kidney stone.  Take the prescription pain medicine as needed.  Follow-up with your primary provider or your urologist for ongoing evaluation.  Return to the ED if needed.

## 2024-04-17 ENCOUNTER — Other Ambulatory Visit: Payer: Self-pay

## 2024-04-17 ENCOUNTER — Other Ambulatory Visit: Payer: Self-pay | Admitting: Gerontology

## 2024-04-17 DIAGNOSIS — E1142 Type 2 diabetes mellitus with diabetic polyneuropathy: Secondary | ICD-10-CM

## 2024-04-20 ENCOUNTER — Other Ambulatory Visit: Payer: Self-pay

## 2024-04-21 ENCOUNTER — Other Ambulatory Visit: Payer: Self-pay

## 2024-04-21 MED FILL — Insulin Glargine Soln Pen-Injector 100 Unit/ML: SUBCUTANEOUS | 37 days supply | Qty: 15 | Fill #0 | Status: AC

## 2024-04-23 ENCOUNTER — Other Ambulatory Visit: Payer: Self-pay

## 2024-04-24 ENCOUNTER — Other Ambulatory Visit: Payer: Self-pay

## 2024-05-06 ENCOUNTER — Other Ambulatory Visit: Payer: Self-pay

## 2024-05-17 ENCOUNTER — Other Ambulatory Visit: Payer: Self-pay

## 2024-05-17 ENCOUNTER — Other Ambulatory Visit: Payer: Self-pay | Admitting: Gerontology

## 2024-05-17 DIAGNOSIS — E1142 Type 2 diabetes mellitus with diabetic polyneuropathy: Secondary | ICD-10-CM

## 2024-05-19 ENCOUNTER — Telehealth: Payer: Self-pay

## 2024-05-19 ENCOUNTER — Other Ambulatory Visit: Payer: Self-pay

## 2024-05-19 MED ORDER — BASAGLAR KWIKPEN 100 UNIT/ML ~~LOC~~ SOPN
40.0000 [IU] | PEN_INJECTOR | Freq: Every day | SUBCUTANEOUS | 0 refills | Status: AC
  Filled 2024-05-19: qty 15, 37d supply, fill #0

## 2024-05-19 NOTE — Telephone Encounter (Signed)
Called pt to make appt.  Not available.

## 2024-05-21 ENCOUNTER — Other Ambulatory Visit: Payer: Self-pay

## 2024-05-22 ENCOUNTER — Other Ambulatory Visit: Payer: Self-pay

## 2024-06-03 ENCOUNTER — Other Ambulatory Visit: Payer: Self-pay

## 2024-06-05 ENCOUNTER — Other Ambulatory Visit: Payer: Self-pay

## 2024-06-08 ENCOUNTER — Other Ambulatory Visit: Payer: Self-pay

## 2024-06-08 ENCOUNTER — Other Ambulatory Visit: Payer: Self-pay | Admitting: Gerontology

## 2024-06-08 DIAGNOSIS — E1142 Type 2 diabetes mellitus with diabetic polyneuropathy: Secondary | ICD-10-CM

## 2024-06-14 ENCOUNTER — Other Ambulatory Visit: Payer: Self-pay | Admitting: Gerontology

## 2024-06-14 DIAGNOSIS — I10 Essential (primary) hypertension: Secondary | ICD-10-CM

## 2024-06-17 ENCOUNTER — Other Ambulatory Visit: Payer: Self-pay

## 2024-06-17 ENCOUNTER — Other Ambulatory Visit: Payer: Self-pay | Admitting: Gerontology

## 2024-06-17 DIAGNOSIS — E1142 Type 2 diabetes mellitus with diabetic polyneuropathy: Secondary | ICD-10-CM

## 2024-06-17 DIAGNOSIS — I10 Essential (primary) hypertension: Secondary | ICD-10-CM

## 2024-06-19 ENCOUNTER — Other Ambulatory Visit: Payer: Self-pay

## 2025-03-26 ENCOUNTER — Ambulatory Visit: Payer: Self-pay | Admitting: Physician Assistant
